# Patient Record
Sex: Male | Born: 1955 | Race: White | Hispanic: No | Marital: Single | Smoking: Current every day smoker
Health system: Southern US, Community
[De-identification: ages and names within clinical notes are randomized; demographics above are authoritative.]

## PROBLEM LIST (undated history)

## (undated) ENCOUNTER — Emergency Department (HOSPITAL_COMMUNITY): Discharge status: Against Medical Advice | Payer: BC Managed Care – PPO

## (undated) DIAGNOSIS — Z7189 Other specified counseling: Secondary | ICD-10-CM

## (undated) DIAGNOSIS — K909 Intestinal malabsorption, unspecified: Secondary | ICD-10-CM

## (undated) DIAGNOSIS — T1490XA Injury, unspecified, initial encounter: Secondary | ICD-10-CM

## (undated) DIAGNOSIS — D500 Iron deficiency anemia secondary to blood loss (chronic): Secondary | ICD-10-CM

## (undated) DIAGNOSIS — Z789 Other specified health status: Secondary | ICD-10-CM

## (undated) DIAGNOSIS — C73 Malignant neoplasm of thyroid gland: Secondary | ICD-10-CM

## (undated) HISTORY — DX: Intestinal malabsorption, unspecified: K90.9

## (undated) HISTORY — DX: Iron deficiency anemia secondary to blood loss (chronic): D50.0

## (undated) HISTORY — DX: Malignant neoplasm of thyroid gland: C73

## (undated) HISTORY — DX: Other specified counseling: Z71.89

---

## 2010-09-07 ENCOUNTER — Other Ambulatory Visit (HOSPITAL_COMMUNITY): Payer: Worker's Compensation

## 2010-09-13 ENCOUNTER — Ambulatory Visit (HOSPITAL_COMMUNITY)
Admission: RE | Admit: 2010-09-13 | Discharge: 2010-09-13 | Disposition: A | Discharge status: Home / Self Care | Payer: Worker's Compensation | Source: Ambulatory Visit | Attending: Neurological Surgery | Admitting: Neurological Surgery

## 2010-09-13 ENCOUNTER — Encounter (HOSPITAL_COMMUNITY)
Admission: RE | Admit: 2010-09-13 | Discharge: 2010-09-13 | Disposition: A | Discharge status: Home / Self Care | Payer: Worker's Compensation | Source: Ambulatory Visit | Attending: Neurological Surgery | Admitting: Neurological Surgery

## 2010-09-13 ENCOUNTER — Other Ambulatory Visit (HOSPITAL_COMMUNITY): Payer: Self-pay | Admitting: Neurological Surgery

## 2010-09-13 DIAGNOSIS — M4800 Spinal stenosis, site unspecified: Secondary | ICD-10-CM

## 2010-09-13 DIAGNOSIS — Z01818 Encounter for other preprocedural examination: Secondary | ICD-10-CM | POA: Insufficient documentation

## 2010-09-13 DIAGNOSIS — Z01812 Encounter for preprocedural laboratory examination: Secondary | ICD-10-CM | POA: Insufficient documentation

## 2010-09-13 DIAGNOSIS — Z01810 Encounter for preprocedural cardiovascular examination: Secondary | ICD-10-CM | POA: Insufficient documentation

## 2010-09-13 LAB — CBC
HCT: 44.7 % (ref 39.0–52.0)
MCH: 32.9 pg (ref 26.0–34.0)
MCHC: 36 g/dL (ref 30.0–36.0)
MCV: 91.2 fL (ref 78.0–100.0)
Platelets: 194 10*3/uL (ref 150–400)
RDW: 12.1 % (ref 11.5–15.5)
WBC: 6.6 10*3/uL (ref 4.0–10.5)

## 2010-09-13 LAB — TYPE AND SCREEN: Antibody Screen: NEGATIVE

## 2010-09-13 LAB — BASIC METABOLIC PANEL
Calcium: 9.5 mg/dL (ref 8.4–10.5)
GFR calc Af Amer: >60 mL/min (ref >60)
GFR calc non Af Amer: >60 mL/min (ref >60)
Glucose, Bld: 63 mg/dL — ABNORMAL LOW (ref 70–99)
Potassium: 3.8 mEq/L (ref 3.5–5.1)
Sodium: 139 mEq/L (ref 135–145)

## 2010-09-13 LAB — DIFFERENTIAL
Eosinophils Absolute: 0.1 10*3/uL (ref 0.0–0.7)
Eosinophils Relative: 1 % (ref 0–5)
Lymphocytes Relative: 30 % (ref 12–46)
Lymphs Abs: 2 10*3/uL (ref 0.7–4.0)
Monocytes Absolute: 0.7 10*3/uL (ref 0.1–1.0)
Monocytes Relative: 11 % (ref 3–12)

## 2010-09-13 LAB — SURGICAL PCR SCREEN: Staphylococcus aureus: NEGATIVE

## 2010-09-13 LAB — PROTIME-INR: INR: 0.91 (ref 0.00–1.49)

## 2010-09-15 ENCOUNTER — Inpatient Hospital Stay (HOSPITAL_COMMUNITY)
Admission: RE | Admit: 2010-09-15 | Discharge: 2010-09-18 | DRG: 460 | Disposition: A | Discharge status: Home / Self Care | Payer: Worker's Compensation | Source: Ambulatory Visit | Attending: Neurological Surgery | Admitting: Neurological Surgery

## 2010-09-15 ENCOUNTER — Inpatient Hospital Stay (HOSPITAL_COMMUNITY): Payer: Worker's Compensation

## 2010-09-15 DIAGNOSIS — F17200 Nicotine dependence, unspecified, uncomplicated: Secondary | ICD-10-CM | POA: Diagnosis present

## 2010-09-15 DIAGNOSIS — M5126 Other intervertebral disc displacement, lumbar region: Principal | ICD-10-CM | POA: Diagnosis present

## 2010-09-15 DIAGNOSIS — IMO0002 Reserved for concepts with insufficient information to code with codable children: Secondary | ICD-10-CM | POA: Diagnosis not present

## 2010-09-15 DIAGNOSIS — M5137 Other intervertebral disc degeneration, lumbosacral region: Secondary | ICD-10-CM | POA: Diagnosis present

## 2010-09-15 DIAGNOSIS — Z01818 Encounter for other preprocedural examination: Secondary | ICD-10-CM

## 2010-09-15 DIAGNOSIS — Z01810 Encounter for preprocedural cardiovascular examination: Secondary | ICD-10-CM

## 2010-09-15 DIAGNOSIS — G9741 Accidental puncture or laceration of dura during a procedure: Secondary | ICD-10-CM | POA: Diagnosis not present

## 2010-09-15 DIAGNOSIS — Y9210 Unspecified residential institution as the place of occurrence of the external cause: Secondary | ICD-10-CM | POA: Diagnosis not present

## 2010-09-15 DIAGNOSIS — M51379 Other intervertebral disc degeneration, lumbosacral region without mention of lumbar back pain or lower extremity pain: Secondary | ICD-10-CM | POA: Diagnosis present

## 2010-09-15 DIAGNOSIS — Z01812 Encounter for preprocedural laboratory examination: Secondary | ICD-10-CM

## 2010-09-27 ENCOUNTER — Ambulatory Visit
Admission: RE | Admit: 2010-09-27 | Discharge: 2010-09-27 | Disposition: A | Discharge status: Home / Self Care | Payer: Worker's Compensation | Source: Ambulatory Visit | Attending: Neurological Surgery | Admitting: Neurological Surgery

## 2010-09-27 ENCOUNTER — Other Ambulatory Visit: Payer: Self-pay | Admitting: Neurological Surgery

## 2010-09-27 DIAGNOSIS — M5126 Other intervertebral disc displacement, lumbar region: Secondary | ICD-10-CM

## 2010-09-27 DIAGNOSIS — M79609 Pain in unspecified limb: Secondary | ICD-10-CM

## 2010-09-27 DIAGNOSIS — M5137 Other intervertebral disc degeneration, lumbosacral region: Secondary | ICD-10-CM

## 2010-09-28 NOTE — Op Note (Signed)
NAME:  Bruce Olson, Bruce Olson NO.:  [id number]  MEDICAL RECORD NO.:  [id number]  LOCATION:  3035                         FACILITY:  [facility]  PHYSICIAN:  Tia Alert, MD     DATE OF BIRTH:  11/06/1955  DATE OF PROCEDURE:  09/15/2010 DATE OF DISCHARGE:                              OPERATIVE REPORT   PREOPERATIVE DIAGNOSES: 1. Degenerative disk disease L4-5, L5-S1. 2. 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 3. 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 4. Back pain. 5. Right leg pain.  POSTOPERATIVE DIAGNOSES: 1. Degenerative disk disease L4-5, L5-S1. 2. 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 3. 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 4. Back pain. 5. Right leg pain.  PROCEDURE PERFORMED: 1. Bilateral lumbar decompressive laminectomies and hemi-facetectomies     for decompression of the L4-L5 and S1 nerve roots requiring more     work than is required for a simple PLIF exposure in order to     adequately decompress the neural elements. 2. Posterior lumbar interbody fusion L4-5, L5-S1 utilizing 10-mm PEEK     interbody cages packed with local autograft and morselized     allograft, and 10-mm Tangent interbody bone wedges. 3. Intertransverse arthrodesis L4-S1 utilizing local autograft and     morselized allograft. 4. Segmental fixation L4-S1 utilizing the globus pedicle screw system.  SURGEON:  Tia Alert, MD.  ASSISTANT:  Donalee Citrin, MD.  ANESTHESIA:  General tracheal.  COMPLICATIONS:  Small unintended durotomy repaired primarily in the midline and the L4-5 level.  INDICATIONS FOR PROCEDURE:  Bruce Olson is a 55 year old gentleman who hurt his back about a year and half ago.  He had MRI, which showed severe disk degeneration and desiccation with Modic endplate changes and severe foraminal stenosis L5-S1 with broad-based disk herniation at each level.  He had back and right leg pain.  He had tried medical management for some time without significant relief.  I recommended  two-level PLIF at L4-5 and L5-S1.  He understood the risks, benefits, and suspected outcome and wished to proceed.  DESCRIPTION OF PROCEDURE:  The patient was taken to the operating room. After induction of adequate generalized endotracheal anesthesia, he was rolled into prone position on chest rolls and all pressure points were padded.  His lumbar region was prepped with DuraPrep and then draped in usual sterile fashion, 5 mL of local anesthesia injected, and a small dorsal midline incision made and carried down to the sacral fascia.  The fascia was opened and the paraspinous musculature was taken down in subperiosteal fashion to expose L4-5 and L5-S1.  Intraoperative fluoroscopy confirmed that level and then I removed the spinous processes of L4 and L5.  I used the Leksell rongeur and the Kerrison punch to perform complete laminectomies, hemi-facetectomy, and foraminotomies at L4-5 and L5-S1. At L4-5, the dura was quite adherent to the superior extent of the yellow ligament, and lamina complex right in the midline, and a small unintended durotomy was created.  Once I recognized this, I was able to dissect the dura away from the undersurface of the bone, widen my  laminectomy here to expose the unintended durotomy.  I then was able to use a 5-0 Prolene and close this primarily with a  running suture. I then had the patient Valsalva and saw no evidence of CSF leakage.  I then completed my decompression by finishing my hemi-facetectomies at L4-5 and L5-S1.  I continued to remove the yellow ligament from the lateral recesses and the foramina, identified the L4, L5, and S1 nerve roots, and decompressed them distally and their respective foramina marching along the pedicle, skeletonizing the pedicle.  I did this especially on the patient's right side because of right-sided leg pain.  I completely skeletonized the L4 and L5 nerve roots distally.  I then turned my attention to the posterior  lumbar interbody fusion IV.  We incised the disk space at L4-5 and L5-S1 bilaterally.  We distracted the space up to 10 mm and then used a 10-mm rotating cutter and cutting chisel to prepare the endplates.  We did this bilaterally.  The midline was prepared with an Epstein curette, a near complete diskectomy was completed on the right- hand side, which was fixed from working out underneath the nerve roots and the extraforaminal space making sure the nerve roots were well decompressed.  We then used 10-mm PEEK interbody cages packed with a local autograft and morselized allograft and tapped these into position at L4-5, L5-S1, and also used 10-mm Tangent interbody bone wedges on the opposite side.  The midline was packed with local autograft and morselized allograft.  Once our PLIF was completed, we turned our attention to the pedicle screws.  We localized pedicle screw entry zones bilaterally utilizing surface landmarks and lateral fluoroscopy.  We probed each pedicle with pedicle probe, tapped each pedicle and placed 65 x 45 mm pedicle screws in the L4-L5 pedicles and 65 x 35 mm pedicle screws into the sacrum bilaterally.  The transverse processes were decorticated and a mixture of local autograft and morselized allograft were placed out over these to perform intertransverse arthrodesis at each level.  We then placed lordotic rods into the multiaxial screw heads of the pedicle screws, and locked these in position with locking caps and rectal devices while achieving compression of our grafts.  The cross-link was then placed. The wound was copiously irrigated with bacitracin cleansing solution.  The surgical bed was dried with bipolar cautery, followed by a Surgifoam.  I palpated along the nerve roots once again with coronary dilator and tried decompression of the nerve roots. I then inspected the primary closure of the unintended durotomy once again, it looked quite good.  I lined this with  Tisseel fibrin glue and then lined the dura with Gelfoam.  I placed a medium Hemovac drain through separate stab incision and placed it in the lateral gutter, away from the unintended durotomy.  I then closed the muscle and fascia with 0 Vicryl and closed subcutaneous tissue with 2-0 and 3-0 Vicryl, and closed skin with Benzoin and Steri-Strips.  The drapes were removed. Sterile dressing was applied.  The patient awakened from general anesthesia and transferred to recovery room in stable condition. At the end of procedure, all sponge, needles counts were correct.     Tia Alert, MD     DSJ/MEDQ  D:  09/15/2010  T:  09/16/2010  Job:  [id number]  Electronically Signed by Marikay Alar MD on 09/28/2010 02:25:07 PM

## 2010-11-19 NOTE — Discharge Summary (Signed)
  NAME:  Bruce Olson, CARLILE NO.:  [id number]  MEDICAL RECORD NO.:  [id number]  LOCATION:  3035                         FACILITY:  [facility]  PHYSICIAN:  Tia Alert, MD     DATE OF BIRTH:  [birth year]  DATE OF ADMISSION:  09/15/2010 DATE OF DISCHARGE:  09/18/2010                              DISCHARGE SUMMARY   ADMITTING DIAGNOSIS:  Lumbar spondylosis with degenerative disk disease.  PROCEDURE:  Posterior lumbar interbody fusion at L4-L5 and L5-S1.  BRIEF HISTORY OF PRESENT ILLNESS:  Bruce Olson is a very pleasant 55- year-old gentleman who hurt his back about a year-and-half.  Prior to the surgery, he had an MRI which showed severe degenerative disk disease and desiccation with Modic endplate changes with severe foraminal stenosis at both levels.  He had back and right leg pain.  I recommended two-level posterior lumbar interbody fusion at L4-L5 and L5-S1.  He understood the risks, benefits, and expected outcome and wished to proceed.  HOSPITAL COURSE:  The patient was admitted on September 15, 2010 where he underwent a PLIF at L4-L5 and L5-S1.  The patient tolerated the procedure well and was taken to the recovery room and then to the floor in stable condition.  For details of the operative procedure, please see the dictated operative note.  The patient's hospital course was fairly routine.  He did have a unintended durotomy at the time of the surgery and therefore he remained at flat bed rest for 2 days.  He has been mobilized and did fairly well.  He had significant back soreness which was expected and had resolution of his leg pain.  His incision was clean, dry, and intact.  He had no postoperative headache.  He was discharged home in stable condition on September 18, 2010 with plans to follow up in 2 weeks.  He was to call for any unusual redness, tenderness, or drainage from his wound or any temperature of 101.5 or any significant headaches.  Plan is to follow  up in 2 weeks.  FINAL DIAGNOSIS:  Posterior lumbar interbody fusion, L4-L5 and L5-S1.     Tia Alert, MD     DSJ/MEDQ  D:  11/09/2010  T:  11/09/2010  Job:  [id number]  Electronically Signed by Marikay Alar MD on 11/19/2010 10:49:26 AM

## 2010-11-22 ENCOUNTER — Other Ambulatory Visit: Payer: Self-pay | Admitting: Neurological Surgery

## 2010-11-22 ENCOUNTER — Ambulatory Visit
Admission: RE | Admit: 2010-11-22 | Discharge: 2010-11-22 | Disposition: A | Discharge status: Home / Self Care | Payer: Self-pay | Source: Ambulatory Visit | Attending: Neurological Surgery | Admitting: Neurological Surgery

## 2010-11-22 DIAGNOSIS — M5126 Other intervertebral disc displacement, lumbar region: Secondary | ICD-10-CM

## 2010-11-22 DIAGNOSIS — M48061 Spinal stenosis, lumbar region without neurogenic claudication: Secondary | ICD-10-CM

## 2011-02-01 DIAGNOSIS — T1490XA Injury, unspecified, initial encounter: Secondary | ICD-10-CM

## 2011-02-01 HISTORY — DX: Injury, unspecified, initial encounter: T14.90XA

## 2011-02-01 HISTORY — PX: BACK SURGERY: SHX140

## 2011-02-22 ENCOUNTER — Other Ambulatory Visit: Payer: Self-pay | Admitting: Neurological Surgery

## 2011-02-22 ENCOUNTER — Ambulatory Visit
Admission: RE | Admit: 2011-02-22 | Discharge: 2011-02-22 | Disposition: A | Discharge status: Home / Self Care | Payer: Worker's Compensation | Source: Ambulatory Visit | Attending: Neurological Surgery | Admitting: Neurological Surgery

## 2011-02-22 DIAGNOSIS — M5126 Other intervertebral disc displacement, lumbar region: Secondary | ICD-10-CM

## 2011-02-22 DIAGNOSIS — M5137 Other intervertebral disc degeneration, lumbosacral region: Secondary | ICD-10-CM

## 2011-03-14 ENCOUNTER — Other Ambulatory Visit: Payer: Self-pay | Admitting: Neurological Surgery

## 2011-03-14 DIAGNOSIS — M545 Low back pain: Secondary | ICD-10-CM

## 2011-03-28 ENCOUNTER — Ambulatory Visit
Admission: RE | Admit: 2011-03-28 | Discharge: 2011-03-28 | Disposition: A | Discharge status: Home / Self Care | Payer: Worker's Compensation | Source: Ambulatory Visit | Attending: Neurological Surgery | Admitting: Neurological Surgery

## 2011-03-28 DIAGNOSIS — M545 Low back pain: Secondary | ICD-10-CM

## 2011-04-15 ENCOUNTER — Encounter (HOSPITAL_COMMUNITY): Payer: Self-pay | Admitting: Pharmacy Technician

## 2011-04-20 NOTE — Pre-Procedure Instructions (Signed)
20 Bruce Olson  04/20/2011   Your procedure is scheduled on:  Tuesday April 26, 2011  Report to [facility] at  0745 AM.  Call this number if you have problems the morning of surgery: [phone]   Remember:   Do not eat food:After Midnight.  May have clear liquids: up to 4 Hours before arrival until 0345 am.  Clear liquids include soda, tea, black coffee, apple or grape juice, broth.  Take these medicines the morning of surgery with A SIP OF WATER: Oxycodone if needed for pain, and Pregabalin.   Do not wear jewelry, make-up or nail polish.  Do not wear lotions, powders, or perfumes. You may wear deodorant.  Do not shave 48 hours prior to surgery.  Do not bring valuables to the hospital.  Contacts, dentures or bridgework may not be worn into surgery.  Leave suitcase in the car. After surgery it may be brought to your room.  For patients admitted to the hospital, checkout time is 11:00 AM the day of discharge.   Patients discharged the day of surgery will not be allowed to drive home.  Name and phone number of your driver:   Special Instructions: CHG Shower Use Special Wash: 1/2 bottle night before surgery and 1/2 bottle morning of surgery.   Please read over the following fact sheets that you were given: Pain Booklet, Coughing and Deep Breathing and Surgical Site Infection Prevention

## 2011-04-21 ENCOUNTER — Encounter (HOSPITAL_COMMUNITY)
Admission: RE | Admit: 2011-04-21 | Discharge: 2011-04-21 | Disposition: A | Discharge status: Home / Self Care | Payer: Worker's Compensation | Source: Ambulatory Visit | Attending: Neurological Surgery | Admitting: Neurological Surgery

## 2011-04-21 ENCOUNTER — Encounter (HOSPITAL_COMMUNITY): Payer: Self-pay

## 2011-04-21 HISTORY — DX: Other specified health status: Z78.9

## 2011-04-21 LAB — APTT: aPTT: 29 seconds (ref 24–37)

## 2011-04-21 LAB — BASIC METABOLIC PANEL
BUN: 6 mg/dL (ref 6–23)
CO2: 25 mEq/L (ref 19–32)
Chloride: 102 mEq/L (ref 96–112)
GFR calc Af Amer: >90 mL/min (ref >90)
Potassium: 3.9 mEq/L (ref 3.5–5.1)

## 2011-04-21 LAB — PROTIME-INR: INR: 1.03 (ref 0.00–1.49)

## 2011-04-21 LAB — CBC
HCT: 41.4 % (ref 39.0–52.0)
Hemoglobin: 14.8 g/dL (ref 13.0–17.0)
MCH: 31 pg (ref 26.0–34.0)
RBC: 4.77 MIL/uL (ref 4.22–5.81)

## 2011-04-21 LAB — SURGICAL PCR SCREEN: MRSA, PCR: NEGATIVE

## 2011-04-21 LAB — TYPE AND SCREEN
ABO/RH(D): A POS
Antibody Screen: NEGATIVE

## 2011-04-21 LAB — DIFFERENTIAL
Eosinophils Absolute: 0 10*3/uL (ref 0.0–0.7)
Lymphs Abs: 2 10*3/uL (ref 0.7–4.0)
Monocytes Absolute: 0.6 10*3/uL (ref 0.1–1.0)
Monocytes Relative: 11 % (ref 3–12)
Neutrophils Relative %: 52 % (ref 43–77)

## 2011-04-25 MED ORDER — CEFAZOLIN SODIUM 1-5 GM-% IV SOLN
1.0000 g | INTRAVENOUS | Status: DC
  Filled 2011-04-25: qty 50

## 2011-04-26 ENCOUNTER — Ambulatory Visit (HOSPITAL_COMMUNITY)
Admission: RE | Admit: 2011-04-26 | Discharge: 2011-04-26 | Disposition: A | Discharge status: Home / Self Care | Payer: Worker's Compensation | Source: Ambulatory Visit | Attending: Neurological Surgery | Admitting: Neurological Surgery

## 2011-04-26 ENCOUNTER — Encounter (HOSPITAL_COMMUNITY): Payer: Self-pay | Admitting: *Deleted

## 2011-04-26 MED ORDER — DEXAMETHASONE SODIUM PHOSPHATE 10 MG/ML IJ SOLN: 10.0000 mg | Freq: Once | INTRAMUSCULAR | Status: DC

## 2011-04-26 MED ORDER — DEXTROSE 5 % IV SOLN: 1.0000 g | INTRAVENOUS | Status: DC

## 2011-04-26 MED ORDER — CEFAZOLIN SODIUM 1-5 GM-% IV SOLN
1.0000 g | INTRAVENOUS | Status: AC
  Administered 2011-04-27: 1 g via INTRAVENOUS
  Filled 2011-04-26 (×2): qty 50

## 2011-04-26 MED ORDER — DEXAMETHASONE SODIUM PHOSPHATE 10 MG/ML IJ SOLN
INTRAMUSCULAR | Status: AC
  Filled 2011-04-26: qty 1

## 2011-04-26 NOTE — Progress Notes (Signed)
Notified both HEATHER AND DR HODIERN IN NEURO OR OF PATIENT STATING HE DRANK 2-3 CUPS BLACK COFFEE 730 AM TODAY.

## 2011-04-27 ENCOUNTER — Encounter (HOSPITAL_COMMUNITY): Payer: Self-pay | Admitting: Anesthesiology

## 2011-04-27 ENCOUNTER — Inpatient Hospital Stay (HOSPITAL_COMMUNITY)
Admission: RE | Admit: 2011-04-27 | Discharge: 2011-05-02 | DRG: 460 | Disposition: A | Discharge status: Home / Self Care | Payer: Worker's Compensation | Source: Ambulatory Visit | Attending: Neurological Surgery | Admitting: Neurological Surgery

## 2011-04-27 ENCOUNTER — Ambulatory Visit (HOSPITAL_COMMUNITY): Payer: Worker's Compensation

## 2011-04-27 ENCOUNTER — Encounter (HOSPITAL_COMMUNITY): Payer: Self-pay | Admitting: *Deleted

## 2011-04-27 ENCOUNTER — Ambulatory Visit (HOSPITAL_COMMUNITY)
Admission: RE | Admit: 2011-04-27 | Payer: Worker's Compensation | Source: Ambulatory Visit | Admitting: Neurological Surgery

## 2011-04-27 ENCOUNTER — Encounter (HOSPITAL_COMMUNITY)
Admission: RE | Disposition: A | Discharge status: Home / Self Care | Payer: Self-pay | Source: Ambulatory Visit | Attending: Neurological Surgery

## 2011-04-27 ENCOUNTER — Ambulatory Visit (HOSPITAL_COMMUNITY): Payer: Worker's Compensation | Admitting: Anesthesiology

## 2011-04-27 ENCOUNTER — Encounter (HOSPITAL_COMMUNITY): Admission: RE | Payer: Self-pay | Source: Ambulatory Visit

## 2011-04-27 DIAGNOSIS — M48061 Spinal stenosis, lumbar region without neurogenic claudication: Secondary | ICD-10-CM | POA: Diagnosis present

## 2011-04-27 DIAGNOSIS — F17200 Nicotine dependence, unspecified, uncomplicated: Secondary | ICD-10-CM | POA: Diagnosis present

## 2011-04-27 DIAGNOSIS — S32009A Unspecified fracture of unspecified lumbar vertebra, initial encounter for closed fracture: Principal | ICD-10-CM | POA: Diagnosis present

## 2011-04-27 DIAGNOSIS — X58XXXA Exposure to other specified factors, initial encounter: Secondary | ICD-10-CM | POA: Diagnosis present

## 2011-04-27 SURGERY — POSTERIOR LUMBAR FUSION 1 LEVEL
Anesthesia: General | Site: Back | Wound class: Clean

## 2011-04-27 SURGERY — POSTERIOR LUMBAR FUSION 1 LEVEL
Anesthesia: General | Site: Back

## 2011-04-27 MED ORDER — HYDROMORPHONE HCL PF 1 MG/ML IJ SOLN
INTRAMUSCULAR | Status: AC
  Filled 2011-04-27: qty 1

## 2011-04-27 MED ORDER — GLYCOPYRROLATE 0.2 MG/ML IJ SOLN
INTRAMUSCULAR | Status: DC | PRN
  Administered 2011-04-27: .6 mg via INTRAVENOUS

## 2011-04-27 MED ORDER — PHENOL 1.4 % MT LIQD: 1.0000 | OROMUCOSAL | Status: DC | PRN

## 2011-04-27 MED ORDER — VECURONIUM BROMIDE 10 MG IV SOLR
INTRAVENOUS | Status: DC | PRN
  Administered 2011-04-27: 2 mg via INTRAVENOUS
  Administered 2011-04-27: 1 mg via INTRAVENOUS
  Administered 2011-04-27 (×3): 2 mg via INTRAVENOUS
  Administered 2011-04-27: 1 mg via INTRAVENOUS
  Administered 2011-04-27: 3 mg via INTRAVENOUS

## 2011-04-27 MED ORDER — HYDROMORPHONE 0.3 MG/ML IV SOLN
INTRAVENOUS | Status: AC
  Administered 2011-04-27: 18:00:00
  Filled 2011-04-27: qty 25

## 2011-04-27 MED ORDER — LIDOCAINE HCL (CARDIAC) 20 MG/ML IV SOLN
INTRAVENOUS | Status: DC | PRN
  Administered 2011-04-27: 50 mg via INTRAVENOUS

## 2011-04-27 MED ORDER — PREGABALIN 75 MG PO CAPS
150.0000 mg | ORAL_CAPSULE | Freq: Two times a day (BID) | ORAL | Status: DC
  Administered 2011-04-27 – 2011-05-02 (×10): 150 mg via ORAL
  Filled 2011-04-27 (×3): qty 2
  Filled 2011-04-27: qty 1
  Filled 2011-04-27: qty 2
  Filled 2011-04-27: qty 1
  Filled 2011-04-27 (×5): qty 2

## 2011-04-27 MED ORDER — SODIUM CHLORIDE 0.9 % IV SOLN
INTRAVENOUS | Status: AC
  Administered 2011-04-27: 16:00:00 via INTRAVENOUS
  Filled 2011-04-27: qty 500

## 2011-04-27 MED ORDER — ZOLPIDEM TARTRATE 10 MG PO TABS: 10.0000 mg | ORAL_TABLET | Freq: Every evening | ORAL | Status: DC | PRN

## 2011-04-27 MED ORDER — LACTATED RINGERS IV SOLN: INTRAVENOUS | Status: DC

## 2011-04-27 MED ORDER — DEXAMETHASONE 4 MG PO TABS
4.0000 mg | ORAL_TABLET | Freq: Four times a day (QID) | ORAL | Status: DC
  Administered 2011-04-27 – 2011-05-01 (×13): 4 mg via ORAL
  Filled 2011-04-27 (×18): qty 1

## 2011-04-27 MED ORDER — SODIUM CHLORIDE 0.9 % IJ SOLN: 3.0000 mL | INTRAMUSCULAR | Status: DC | PRN

## 2011-04-27 MED ORDER — SODIUM CHLORIDE 0.9 % IR SOLN
Status: DC | PRN
  Administered 2011-04-27: 14:00:00

## 2011-04-27 MED ORDER — PROPOFOL 10 MG/ML IV EMUL
INTRAVENOUS | Status: DC | PRN
  Administered 2011-04-27: 200 mg via INTRAVENOUS

## 2011-04-27 MED ORDER — POTASSIUM CHLORIDE IN NACL 20-0.9 MEQ/L-% IV SOLN
INTRAVENOUS | Status: DC
  Administered 2011-04-27 – 2011-04-30 (×4): via INTRAVENOUS
  Filled 2011-04-27 (×11): qty 1000

## 2011-04-27 MED ORDER — KETOROLAC TROMETHAMINE 30 MG/ML IJ SOLN
INTRAMUSCULAR | Status: AC
  Administered 2011-04-27: 30 mg via INTRAVENOUS
  Filled 2011-04-27: qty 1

## 2011-04-27 MED ORDER — ROCURONIUM BROMIDE 100 MG/10ML IV SOLN
INTRAVENOUS | Status: DC | PRN
  Administered 2011-04-27: 50 mg via INTRAVENOUS

## 2011-04-27 MED ORDER — SODIUM CHLORIDE 0.9 % IJ SOLN: 3.0000 mL | Freq: Two times a day (BID) | INTRAMUSCULAR | Status: DC

## 2011-04-27 MED ORDER — 0.9 % SODIUM CHLORIDE (POUR BTL) OPTIME
TOPICAL | Status: DC | PRN
  Administered 2011-04-27: 1000 mL

## 2011-04-27 MED ORDER — KETOROLAC TROMETHAMINE 30 MG/ML IJ SOLN
30.0000 mg | Freq: Once | INTRAMUSCULAR | Status: AC
  Administered 2011-04-27: 30 mg via INTRAVENOUS

## 2011-04-27 MED ORDER — DIPHENHYDRAMINE HCL 12.5 MG/5ML PO ELIX: 12.5000 mg | ORAL_SOLUTION | Freq: Four times a day (QID) | ORAL | Status: DC | PRN

## 2011-04-27 MED ORDER — NEOSTIGMINE METHYLSULFATE 1 MG/ML IJ SOLN
INTRAMUSCULAR | Status: DC | PRN
  Administered 2011-04-27: 4 mg via INTRAVENOUS

## 2011-04-27 MED ORDER — THROMBIN 20000 UNITS EX KIT
PACK | CUTANEOUS | Status: DC | PRN
  Administered 2011-04-27: 14:00:00 via TOPICAL

## 2011-04-27 MED ORDER — ALBUMIN HUMAN 5 % IV SOLN
INTRAVENOUS | Status: DC | PRN
  Administered 2011-04-27: 14:00:00 via INTRAVENOUS

## 2011-04-27 MED ORDER — DEXAMETHASONE SODIUM PHOSPHATE 10 MG/ML IJ SOLN
INTRAMUSCULAR | Status: AC
  Administered 2011-04-27: 10 mg via INTRAVENOUS
  Filled 2011-04-27: qty 1

## 2011-04-27 MED ORDER — DEXAMETHASONE SODIUM PHOSPHATE 4 MG/ML IJ SOLN
4.0000 mg | Freq: Four times a day (QID) | INTRAMUSCULAR | Status: DC
  Filled 2011-04-27 (×16): qty 1

## 2011-04-27 MED ORDER — NICOTINE 21 MG/24HR TD PT24
21.0000 mg | MEDICATED_PATCH | Freq: Every day | TRANSDERMAL | Status: DC
  Administered 2011-04-27 – 2011-05-01 (×5): 21 mg via TRANSDERMAL
  Filled 2011-04-27 (×6): qty 1

## 2011-04-27 MED ORDER — CEFAZOLIN SODIUM 1-5 GM-% IV SOLN
1.0000 g | Freq: Three times a day (TID) | INTRAVENOUS | Status: AC
  Administered 2011-04-27 – 2011-04-28 (×2): 1 g via INTRAVENOUS
  Filled 2011-04-27 (×2): qty 50

## 2011-04-27 MED ORDER — MORPHINE SULFATE 10 MG/ML IJ SOLN
INTRAMUSCULAR | Status: DC | PRN
  Administered 2011-04-27 (×3): 2 mg via INTRAVENOUS
  Administered 2011-04-27: 4 mg via INTRAVENOUS

## 2011-04-27 MED ORDER — MIDAZOLAM HCL 5 MG/5ML IJ SOLN
INTRAMUSCULAR | Status: DC | PRN
  Administered 2011-04-27: 2 mg via INTRAVENOUS

## 2011-04-27 MED ORDER — NALOXONE HCL 0.4 MG/ML IJ SOLN: 0.4000 mg | INTRAMUSCULAR | Status: DC | PRN

## 2011-04-27 MED ORDER — ACETAMINOPHEN 325 MG PO TABS
650.0000 mg | ORAL_TABLET | ORAL | Status: DC | PRN
  Filled 2011-04-27: qty 2

## 2011-04-27 MED ORDER — EPHEDRINE SULFATE 50 MG/ML IJ SOLN
INTRAMUSCULAR | Status: DC | PRN
  Administered 2011-04-27: 5 mg via INTRAVENOUS

## 2011-04-27 MED ORDER — HYDROMORPHONE HCL PF 1 MG/ML IJ SOLN
0.2500 mg | INTRAMUSCULAR | Status: DC | PRN
  Administered 2011-04-27 (×4): 0.5 mg via INTRAVENOUS

## 2011-04-27 MED ORDER — MORPHINE SULFATE 2 MG/ML IJ SOLN: 0.0500 mg/kg | INTRAMUSCULAR | Status: DC | PRN

## 2011-04-27 MED ORDER — HYDROMORPHONE 0.3 MG/ML IV SOLN
INTRAVENOUS | Status: DC
Start: 2011-04-27 — End: 2011-05-01
  Administered 2011-04-27: 1.5 mg via INTRAVENOUS
  Administered 2011-04-28: 2.08 mg via INTRAVENOUS
  Administered 2011-04-28: 3.69 mg via INTRAVENOUS
  Administered 2011-04-28: 4.49 mg via INTRAVENOUS
  Administered 2011-04-28: 1.73 mg via INTRAVENOUS
  Administered 2011-04-29 (×2): 0.6 mg via INTRAVENOUS
  Administered 2011-04-29: 0.9 mg via INTRAVENOUS
  Administered 2011-04-29: 1.8 mg via INTRAVENOUS
  Administered 2011-04-29: 0.9 mg via INTRAVENOUS
  Administered 2011-04-29 – 2011-04-30 (×2): 2.1 mg via INTRAVENOUS
  Administered 2011-04-30: 1.8 mg via INTRAVENOUS
  Administered 2011-04-30: 1.5 mg via INTRAVENOUS
  Administered 2011-04-30: 10:00:00 via INTRAVENOUS
  Administered 2011-05-01 (×2): 0.9 mg via INTRAVENOUS
  Administered 2011-05-01: 2.06 mg via INTRAVENOUS

## 2011-04-27 MED ORDER — ONDANSETRON HCL 4 MG/2ML IJ SOLN: 4.0000 mg | INTRAMUSCULAR | Status: DC | PRN

## 2011-04-27 MED ORDER — BUPIVACAINE HCL (PF) 0.25 % IJ SOLN
INTRAMUSCULAR | Status: DC | PRN
  Administered 2011-04-27: 8 mL

## 2011-04-27 MED ORDER — HYDROMORPHONE HCL PF 1 MG/ML IJ SOLN
0.5000 mg | INTRAMUSCULAR | Status: AC | PRN
  Administered 2011-04-27 (×4): 0.5 mg via INTRAVENOUS

## 2011-04-27 MED ORDER — MENTHOL 3 MG MT LOZG
1.0000 | LOZENGE | OROMUCOSAL | Status: DC | PRN
  Administered 2011-04-28: 3 mg via ORAL
  Filled 2011-04-27 (×2): qty 9

## 2011-04-27 MED ORDER — DOCUSATE SODIUM 100 MG PO CAPS
100.0000 mg | ORAL_CAPSULE | Freq: Every day | ORAL | Status: DC
  Administered 2011-04-27 – 2011-05-02 (×6): 100 mg via ORAL
  Filled 2011-04-27 (×5): qty 1

## 2011-04-27 MED ORDER — LABETALOL HCL 5 MG/ML IV SOLN
INTRAVENOUS | Status: DC | PRN
  Administered 2011-04-27: 5 mg via INTRAVENOUS

## 2011-04-27 MED ORDER — SODIUM CHLORIDE 0.9 % IJ SOLN: 9.0000 mL | INTRAMUSCULAR | Status: DC | PRN

## 2011-04-27 MED ORDER — ACETAMINOPHEN 650 MG RE SUPP: 650.0000 mg | RECTAL | Status: DC | PRN

## 2011-04-27 MED ORDER — METHOCARBAMOL 100 MG/ML IJ SOLN
500.0000 mg | Freq: Four times a day (QID) | INTRAVENOUS | Status: DC | PRN
  Filled 2011-04-27: qty 5

## 2011-04-27 MED ORDER — FENTANYL CITRATE 0.05 MG/ML IJ SOLN
INTRAMUSCULAR | Status: DC | PRN
  Administered 2011-04-27 (×2): 50 ug via INTRAVENOUS
  Administered 2011-04-27: 100 ug via INTRAVENOUS
  Administered 2011-04-27 (×6): 50 ug via INTRAVENOUS

## 2011-04-27 MED ORDER — METHOCARBAMOL 500 MG PO TABS
500.0000 mg | ORAL_TABLET | Freq: Four times a day (QID) | ORAL | Status: DC | PRN
  Administered 2011-04-28 – 2011-05-02 (×13): 500 mg via ORAL
  Filled 2011-04-27 (×14): qty 1

## 2011-04-27 MED ORDER — OXYCODONE-ACETAMINOPHEN 5-325 MG PO TABS
1.0000 | ORAL_TABLET | ORAL | Status: DC | PRN
Start: 2011-04-27 — End: 2011-05-02
  Administered 2011-04-29 – 2011-05-02 (×11): 2 via ORAL
  Administered 2011-05-02: 1 via ORAL
  Filled 2011-04-27 (×12): qty 2

## 2011-04-27 MED ORDER — LACTATED RINGERS IV SOLN
INTRAVENOUS | Status: DC | PRN
  Administered 2011-04-27 (×3): via INTRAVENOUS

## 2011-04-27 MED ORDER — ONDANSETRON HCL 4 MG/2ML IJ SOLN: 4.0000 mg | Freq: Four times a day (QID) | INTRAMUSCULAR | Status: DC | PRN

## 2011-04-27 MED ORDER — BACITRACIN 50000 UNITS IM SOLR
INTRAMUSCULAR | Status: AC
  Filled 2011-04-27: qty 1

## 2011-04-27 MED ORDER — ADULT MULTIVITAMIN W/MINERALS CH
1.0000 | ORAL_TABLET | Freq: Every day | ORAL | Status: DC
  Administered 2011-04-27 – 2011-05-02 (×6): 1 via ORAL
  Filled 2011-04-27 (×6): qty 1

## 2011-04-27 MED ORDER — ONDANSETRON HCL 4 MG/2ML IJ SOLN
INTRAMUSCULAR | Status: DC | PRN
  Administered 2011-04-27: 4 mg via INTRAVENOUS

## 2011-04-27 MED ORDER — SODIUM CHLORIDE 0.9 % IV SOLN: 250.0000 mL | INTRAVENOUS | Status: DC

## 2011-04-27 MED ORDER — DIPHENHYDRAMINE HCL 50 MG/ML IJ SOLN: 12.5000 mg | Freq: Four times a day (QID) | INTRAMUSCULAR | Status: DC | PRN

## 2011-04-27 MED ORDER — KETOROLAC TROMETHAMINE 30 MG/ML IJ SOLN
30.0000 mg | Freq: Four times a day (QID) | INTRAMUSCULAR | Status: AC | PRN
  Administered 2011-04-28: 30 mg via INTRAVENOUS
  Filled 2011-04-27: qty 1

## 2011-04-27 MED ORDER — METHOCARBAMOL 100 MG/ML IJ SOLN
500.0000 mg | INTRAVENOUS | Status: AC
  Administered 2011-04-27: 500 mg via INTRAVENOUS
  Filled 2011-04-27: qty 5

## 2011-04-27 SURGICAL SUPPLY — 60 items
BAG DECANTER FOR FLEXI CONT (MISCELLANEOUS) ×2 IMPLANT
BENZOIN TINCTURE PRP APPL 2/3 (GAUZE/BANDAGES/DRESSINGS) ×2 IMPLANT
BLADE SURG ROTATE 9660 (MISCELLANEOUS) IMPLANT
BUR MATCHSTICK NEURO 3.0 LAGG (BURR) ×2 IMPLANT
CANISTER SUCTION 2500CC (MISCELLANEOUS) ×2 IMPLANT
CAP REVERE LOCKING (Cap) ×18 IMPLANT
CLOTH BEACON ORANGE TIMEOUT ST (SAFETY) ×2 IMPLANT
CONN CROSSLINK REV 38-50MM (Connector) ×2 IMPLANT
CONNECTOR CRSLINK REV 38-50MM (Connector) ×1 IMPLANT
CONT SPEC 4OZ CLIKSEAL STRL BL (MISCELLANEOUS) ×4 IMPLANT
COVER BACK TABLE 24X17X13 BIG (DRAPES) IMPLANT
COVER TABLE BACK 60X90 (DRAPES) ×2 IMPLANT
DRAPE C-ARM 42X72 X-RAY (DRAPES) ×4 IMPLANT
DRAPE LAPAROTOMY 100X72X124 (DRAPES) ×2 IMPLANT
DRAPE POUCH INSTRU U-SHP 10X18 (DRAPES) ×2 IMPLANT
DRAPE SURG 17X23 STRL (DRAPES) ×2 IMPLANT
DRESSING TELFA 8X3 (GAUZE/BANDAGES/DRESSINGS) ×2 IMPLANT
DRSG OPSITE 4X5.5 SM (GAUZE/BANDAGES/DRESSINGS) ×4 IMPLANT
DURAPREP 26ML APPLICATOR (WOUND CARE) ×2 IMPLANT
ELECT REM PT RETURN 9FT ADLT (ELECTROSURGICAL) ×2
ELECTRODE REM PT RTRN 9FT ADLT (ELECTROSURGICAL) ×1 IMPLANT
EVACUATOR 1/8 PVC DRAIN (DRAIN) ×2 IMPLANT
GAUZE SPONGE 4X4 16PLY XRAY LF (GAUZE/BANDAGES/DRESSINGS) IMPLANT
GLOVE BIO SURGEON STRL SZ8 (GLOVE) ×6 IMPLANT
GLOVE BIOGEL PI IND STRL 7.0 (GLOVE) ×1 IMPLANT
GLOVE BIOGEL PI INDICATOR 7.0 (GLOVE) ×1
GLOVE EXAM NITRILE MD LF STRL (GLOVE) ×2 IMPLANT
GLOVE INDICATOR 8.5 STRL (GLOVE) ×2 IMPLANT
GLOVE SURG SS PI 7.0 STRL IVOR (GLOVE) ×4 IMPLANT
GOWN BRE IMP SLV AUR LG STRL (GOWN DISPOSABLE) IMPLANT
GOWN BRE IMP SLV AUR XL STRL (GOWN DISPOSABLE) ×6 IMPLANT
GOWN STRL REIN 2XL LVL4 (GOWN DISPOSABLE) ×2 IMPLANT
HEMOSTAT POWDER KIT SURGIFOAM (HEMOSTASIS) IMPLANT
KIT BASIN OR (CUSTOM PROCEDURE TRAY) ×2 IMPLANT
KIT ROOM TURNOVER OR (KITS) ×2 IMPLANT
NEEDLE BIOPSY T-LOK 11X4 (NEEDLE) ×2 IMPLANT
NEEDLE HYPO 25X1 1.5 SAFETY (NEEDLE) ×2 IMPLANT
NS IRRIG 1000ML POUR BTL (IV SOLUTION) ×2 IMPLANT
PACK FOAM VITOSS 10CC (Orthopedic Implant) ×2 IMPLANT
PACK LAMINECTOMY NEURO (CUSTOM PROCEDURE TRAY) ×2 IMPLANT
PAD ARMBOARD 7.5X6 YLW CONV (MISCELLANEOUS) ×6 IMPLANT
PUTTY BONE DBX 5CC MIX (Putty) ×2 IMPLANT
ROD REVERE 6.35 CURVED 125MM (Rod) ×4 IMPLANT
SCREW REVERE 5.5X45 (Screw) ×4 IMPLANT
SCREW REVERE 6.35 5.5X40MM (Screw) ×4 IMPLANT
SPONGE LAP 4X18 X RAY DECT (DISPOSABLE) IMPLANT
SPONGE SURGIFOAM ABS GEL 100 (HEMOSTASIS) ×2 IMPLANT
STRIP CLOSURE SKIN 1/2X4 (GAUZE/BANDAGES/DRESSINGS) ×4 IMPLANT
SUT VIC AB 0 CT1 18XCR BRD8 (SUTURE) ×1 IMPLANT
SUT VIC AB 0 CT1 8-18 (SUTURE) ×1
SUT VIC AB 2-0 CP2 18 (SUTURE) ×4 IMPLANT
SUT VIC AB 3-0 SH 8-18 (SUTURE) ×4 IMPLANT
SYR 20CC LL (SYRINGE) ×2 IMPLANT
SYR 20ML ECCENTRIC (SYRINGE) ×2 IMPLANT
SYR CONTROL 10ML LL (SYRINGE) ×2 IMPLANT
TOWEL OR 17X24 6PK STRL BLUE (TOWEL DISPOSABLE) ×2 IMPLANT
TOWEL OR 17X26 10 PK STRL BLUE (TOWEL DISPOSABLE) ×2 IMPLANT
TRAP SPECIMEN MUCOUS 40CC (MISCELLANEOUS) ×2 IMPLANT
TRAY FOLEY CATH 14FRSI W/METER (CATHETERS) ×2 IMPLANT
WATER STERILE IRR 1000ML POUR (IV SOLUTION) ×2 IMPLANT

## 2011-04-27 SURGICAL SUPPLY — 17 items
BLADE SURG ROTATE 9660 (MISCELLANEOUS) IMPLANT
COVER BACK TABLE 24X17X13 BIG (DRAPES) IMPLANT
DRAPE C-ARM 42X72 X-RAY (DRAPES) ×4 IMPLANT
DRAPE LAPAROTOMY 100X72X124 (DRAPES) ×2 IMPLANT
DRAPE POUCH INSTRU U-SHP 10X18 (DRAPES) ×2 IMPLANT
DRAPE SURG 17X23 STRL (DRAPES) ×2 IMPLANT
DRSG OPSITE 4X5.5 SM (GAUZE/BANDAGES/DRESSINGS) ×4 IMPLANT
GAUZE SPONGE 4X4 16PLY XRAY LF (GAUZE/BANDAGES/DRESSINGS) IMPLANT
GOWN STRL REIN 2XL LVL4 (GOWN DISPOSABLE) IMPLANT
HEMOSTAT POWDER KIT SURGIFOAM (HEMOSTASIS) IMPLANT
KIT BASIN OR (CUSTOM PROCEDURE TRAY) ×2 IMPLANT
KIT ROOM TURNOVER OR (KITS) ×2 IMPLANT
SPONGE LAP 4X18 X RAY DECT (DISPOSABLE) IMPLANT
SYR 20ML ECCENTRIC (SYRINGE) ×2 IMPLANT
TOWEL OR 17X24 6PK STRL BLUE (TOWEL DISPOSABLE) ×2 IMPLANT
TOWEL OR 17X26 10 PK STRL BLUE (TOWEL DISPOSABLE) ×2 IMPLANT
TRAP SPECIMEN MUCOUS 40CC (MISCELLANEOUS) IMPLANT

## 2011-04-27 NOTE — Anesthesia Postprocedure Evaluation (Signed)
  Anesthesia Post-op Note  Patient: Bruce Olson  Procedure(s) Performed: Procedure(s) (LRB): POSTERIOR LUMBAR FUSION 1 LEVEL (N/A)  Patient Location: PACU  Anesthesia Type: General  Level of Consciousness: awake  Airway and Oxygen Therapy: Patient Spontanous Breathing  Post-op Pain: mild  Post-op Assessment: Post-op Vital signs reviewed  Post-op Vital Signs: stable  Complications: No apparent anesthesia complications

## 2011-04-27 NOTE — H&P (Signed)
Subjective: Patient is a 56 y.o. male admitted for an extension of hardware for an L4 fracture. Onset of symptoms was several weeks ago, gradually worsening since that time.  The pain is rated moderate, and is located at the across the lower back and radiates to legs. The pain is described as aching and occurs intermittently. The symptoms have been progressive. Symptoms are exacerbated by standing for more than a few minutes. MRI or CT showed a transverse fracture across the L4 vertebral body adjacent to the existing pedicle screws with some canal stenosis at L3-4.   Past Medical History  Diagnosis Date  . No pertinent past medical history     Past Surgical History  Procedure Date  . Back surgery     Back surgery 09/15/2010    Prior to Admission medications   Medication Sig Start Date End Date Taking? Authorizing Provider  oxycodone (OXYCONTIN) 30 MG TB12 Take 30 mg by mouth every 8 (eight) hours.   Yes Historical Provider, MD  oxyCODONE-acetaminophen (PERCOCET) 10-325 MG per tablet Take 1 tablet by mouth every 4 (four) hours.   Yes Historical Provider, MD  docusate sodium (COLACE) 100 MG capsule Take 100 mg by mouth daily.    Historical Provider, MD  Multiple Vitamin (MULITIVITAMIN WITH MINERALS) TABS Take 1 tablet by mouth daily.    Historical Provider, MD  pregabalin (LYRICA) 150 MG capsule Take 150 mg by mouth 2 (two) times daily.    Historical Provider, MD  zolpidem (AMBIEN CR) 12.5 MG CR tablet Take 12.5 mg by mouth at bedtime.    Historical Provider, MD   No Known Allergies  History  Substance Use Topics  . Smoking status: Current Everyday Smoker -- 2.0 packs/day for 33 years    Types: Cigarettes  . Smokeless tobacco: Not on file  . Alcohol Use: Yes     beer 3x week    Family History  Problem Relation Age of Onset  . Anesthesia problems Neg Hx   . Hypotension Neg Hx   . Malignant hyperthermia Neg Hx   . Pseudochol deficiency Neg Hx      Review of Systems  Positive ROS:  neg  All other systems have been reviewed and were otherwise negative with the exception of those mentioned in the HPI and as above.  Objective: Vital signs in last 24 hours: Temp:  [97.6 F (36.4 C)] 97.6 F (36.4 C) (03/27 1039) Pulse Rate:  [65] 65  (03/27 1039) Resp:  [18] 18  (03/27 1039) BP: (120)/(71) 120/71 mmHg (03/27 1039) SpO2:  [98 %] 98 % (03/27 1039)  General Appearance: Alert, cooperative, no distress, appears stated age Head: Normocephalic, without obvious abnormality, atraumatic Eyes: PERRL, conjunctiva/corneas clear, EOM's intact, fundi benign, both eyes      Ears: Normal TM's and external ear canals, both ears Throat: Lips, mucosa, and tongue normal; teeth and gums normal Neck: Supple, symmetrical, trachea midline, no adenopathy; thyroid: No enlargement/tenderness/nodules; no carotid bruit or JVD Back: Symmetric, no curvature, ROM normal, no CVA tenderness Lungs: Clear to auscultation bilaterally, respirations unlabored Heart: Regular rate and rhythm, S1 and S2 normal, no murmur, rub or gallop Abdomen: Soft, non-tender, bowel sounds active all four quadrants, no masses, no organomegaly Extremities: Extremities normal, atraumatic, no cyanosis or edema Pulses: 2+ and symmetric all extremities Skin: Skin color, texture, turgor normal, no rashes or lesions  NEUROLOGIC:   Mental status: Alert and oriented x4,  no aphasia, good attention span, fund of knowledge, and memory Motor Exam -  grossly normal Sensory Exam - grossly normal Reflexes: Normal Coordination - grossly normal Gait - grossly normal Balance - grossly normal Cranial Nerves: I: smell Not tested  II: visual acuity  OS: nl    OD: nl  II: visual fields Full to confrontation  II: pupils Equal, round, reactive to light  III,VII: ptosis None  III,IV,VI: extraocular muscles  Full ROM  V: mastication Normal  V: facial light touch sensation  Normal  V,VII: corneal reflex  Present  VII: facial muscle  function - upper  Normal  VII: facial muscle function - lower Normal  VIII: hearing Not tested  IX: soft palate elevation  Normal  IX,X: gag reflex Present  XI: trapezius strength  5/5  XI: sternocleidomastoid strength 5/5  XI: neck flexion strength  5/5  XII: tongue strength  Normal    Data Review Lab Results  Component Value Date   WBC 5.7 04/21/2011   HGB 14.8 04/21/2011   HCT 41.4 04/21/2011   MCV 86.8 04/21/2011   PLT 213 04/21/2011   Lab Results  Component Value Date   NA 138 04/21/2011   K 3.9 04/21/2011   CL 102 04/21/2011   CO2 25 04/21/2011   BUN 6 04/21/2011   CREATININE 0.74 04/21/2011   GLUCOSE 70 04/21/2011   Lab Results  Component Value Date   INR 1.03 04/21/2011    Assessment/Plan: Patient admitted for extension of hardware to L2 across the L4 fracture, with laminectomy L3-4 and intertransverse fusion L2-L4. Patient has failed conservative therapy.  I explained the condition and procedure to the patient and answered any questions.  Patient wishes to proceed with procedure as planned. Understands risks/ benefits and typical outcomes of procedure.   Nael Petrosyan S 04/27/2011 12:35 PM

## 2011-04-27 NOTE — Anesthesia Preprocedure Evaluation (Signed)
Anesthesia Evaluation  Patient identified by MRN, date of birth, ID band  Reviewed: Allergy & Precautions, H&P , NPO status , Patient's Chart, lab work & pertinent test results  Airway Mallampati: II      Dental   Pulmonary Current Smoker,  breath sounds clear to auscultation        Cardiovascular negative cardio ROS  Rhythm:Regular Rate:Normal     Neuro/Psych negative neurological ROS     GI/Hepatic negative GI ROS, Neg liver ROS,   Endo/Other  negative endocrine ROS  Renal/GU negative Renal ROS     Musculoskeletal   Abdominal   Peds  Hematology   Anesthesia Other Findings   Reproductive/Obstetrics                           Anesthesia Physical Anesthesia Plan  ASA: II  Anesthesia Plan: General   Post-op Pain Management:    Induction: Intravenous  Airway Management Planned: Oral ETT  Additional Equipment:   Intra-op Plan:   Post-operative Plan: Extubation in OR  Informed Consent: I have reviewed the patients History and Physical, chart, labs and discussed the procedure including the risks, benefits and alternatives for the proposed anesthesia with the patient or authorized representative who has indicated his/her understanding and acceptance.     Plan Discussed with: CRNA  Anesthesia Plan Comments:         Anesthesia Quick Evaluation

## 2011-04-27 NOTE — Progress Notes (Signed)
Shift report given to P. Shawnie Dapper RN.  Pt has been beligerent and cursing at PACU staff.  Multiple efforts to repositioning, orders for additional pain medication done.  Ice and sips of H2O offered.  Pt frequently encouraged to relax, take deep breaths to assist with pain management; will continue to monitor closely and make efforts to meet pt's needs.

## 2011-04-27 NOTE — Transfer of Care (Signed)
Immediate Anesthesia Transfer of Care Note  Patient: Bruce Olson  Procedure(s) Performed: Procedure(s) (LRB): POSTERIOR LUMBAR FUSION 1 LEVEL (N/A)  Patient Location: PACU  Anesthesia Type: General  Level of Consciousness: awake  Airway & Oxygen Therapy: Patient Spontanous Breathing  Post-op Assessment: Report given to PACU RN  Post vital signs: Reviewed  Complications: No apparent anesthesia complications

## 2011-04-27 NOTE — Transfer of Care (Signed)
Immediate Anesthesia Transfer of Care Note  Patient: Bruce Olson  Procedure(s) Performed: Procedure(s) (LRB): POSTERIOR LUMBAR FUSION 1 LEVEL (N/A)  Patient Location: PACU  Anesthesia Type: General  Level of Consciousness: awake, alert  and oriented  Airway & Oxygen Therapy: Patient Spontanous Breathing and Patient connected to nasal cannula oxygen  Post-op Assessment: Report given to PACU RN, Post -op Vital signs reviewed and stable, Patient moving all extremities and Patient able to stick tongue midline  Post vital signs: Reviewed and stable  Complications: No apparent anesthesia complications

## 2011-04-27 NOTE — Op Note (Signed)
04/27/2011  4:53 PM  PATIENT:  Bruce Olson  56 y.o. male  PRE-OPERATIVE DIAGNOSIS:  L4 vertebral body fracture above a previous L4-S1 fusion with spinal stenosis at L3-4, back and leg pain  POST-OPERATIVE DIAGNOSIS:  Same  PROCEDURE:   1. Decompressive lumbar laminectomy L3-4 for spinal stenosis   . 2. Posterior fixation L2-S1 using globus pedicle screws.  4. Intertransverse arthrodesis L3-4 using morcellized autograft and allograft.  SURGEON:  Marikay Alar, MD  ASSISTANTS: Dr. Wynetta Emery  ANESTHESIA:  General  EBL: 400 ml  Total I/O In: 2575 [I.V.:2200; Blood:125; IV Piggyback:250] Out: 560 [Urine:160; Blood:400]  BLOOD ADMINISTERED:none  DRAINS: Hemovac   INDICATION FOR PROCEDURE: This is a patient who had a 2 level PLIF at L4-5 and L5-S1 7 months ago. He developed back and bilateral anterior thigh pain left greater than right. Plain films followed by MRI suggested a fracture of L4 obliquely through the body. CT scan confirmed this. I recommended extending his hardware up to L2. He had spinal stenosis at L3-4 from retropulsion and also from retrolisthesis. I recommended a decompressive laminectomy for that.  Patient understood the risks, benefits, and alternatives and potential outcomes and wished to proceed.  PROCEDURE DETAILS:  The patient was brought to the operating room. After induction of generalized endotracheal anesthesia the patient was rolled into the prone position on chest rolls and all pressure points were padded. The patient's lumbar region was cleaned and then prepped with DuraPrep and draped in the usual sterile fashion. Anesthesia was injected and then a dorsal midline incision was made and carried down to the lumbosacral fascia. The fascia was opened and the paraspinous musculature was taken down in a subperiosteal fashion to expose L2-3 and L3-4 as well as the previous hardware from L4-S1. I was able to dissect out over the facets of L2-3 and L3-4 and identified  the transverse processes of L2-L3 and L4. I removed the locking caps from the tulip heads from L4-S1 and then removed the rods. The right L4 pedicle screw was loose and therefore it was removed. The remaining pedicle screws were tight and felt adequate.  Intraoperative fluoroscopy confirmed my level, and the spinous process was removed and complete lumbar laminectomies, medial facetectomy and foraminotomies were performed at L3-4 bilaterally. The yellow ligament was removed to expose the underlying dura and nerve roots, and generous foraminotomies were performed to adequately decompress the neural elements. I palpated along the pedicles of L4 to assure adequate decompression of the L4 nerve roots.  Once the decompression was complete, we turned our attention to the posterior fixation. The pedicle screw entry zones were identified utilizing surface landmarks and AP and lateral fluoroscopy. We probed each pedicle with the pedicle probe and tapped each pedicle with the appropriate tap. We palpated with a ball probe to assure no break in the cortex. We then placed 5 5 x 45 mm pedicle screw into the pedicles bilaterally at L3, and 5 5 x 40 mm pedicle screws into the pedicles of L2 bilaterally. We then decorticated the transverse processes and laid a mixture of morcellized autograft and allograft out over these to perform intertransverse arthrodesis at L3-4. Bone marrow aspirate from the right iliac crest was taken from a separate fascial incision to soak the morcellized allograft.We then placed lordotic rods into the multiaxial screw heads of the pedicle screws and locked these in position with the locking caps and anti-torque device.  We then checked our construct with AP and lateral fluoroscopy. Irrigated with copious  amounts of bacitracin-containing saline solution. Placed a medium Hemovac drain through separate stab incision. Inspected the nerve roots once again to assure adequate decompression, lined to the dura  with Gelfoam, and closed the muscle and the fascia with 0 Vicryl. Closed the subcutaneous tissues with 2-0 Vicryl and subcuticular tissues with 3-0 Vicryl. The skin was closed with benzoin and Steri-Strips. Dressing was then applied, the patient was awakened from general anesthesia and transported to the recovery room in stable condition. At the end of the procedure all sponge, needle and instrument counts were correct.   PLAN OF CARE: Admit to inpatient   PATIENT DISPOSITION:  PACU - hemodynamically stable.   Delay start of Pharmacological VTE agent (>24hrs) due to surgical blood loss or risk of bleeding:  yes

## 2011-04-27 NOTE — Anesthesia Procedure Notes (Signed)
Procedure Name: Intubation Date/Time: 04/27/2011 1:10 PM Performed by: Luster Landsberg Pre-anesthesia Checklist: Patient identified, Emergency Drugs available, Patient being monitored and Suction available Patient Re-evaluated:Patient Re-evaluated prior to inductionOxygen Delivery Method: Circle system utilized Preoxygenation: Pre-oxygenation with 100% oxygen Intubation Type: IV induction Ventilation: Mask ventilation without difficulty Laryngoscope Size: Mac and 3 Grade View: Grade I Tube type: Oral Tube size: 8.0 mm Number of attempts: 1 Airway Equipment and Method: Stylet Placement Confirmation: ETT inserted through vocal cords under direct vision,  positive ETCO2 and breath sounds checked- equal and bilateral Secured at: 22 cm Tube secured with: Tape Dental Injury: Teeth and Oropharynx as per pre-operative assessment

## 2011-04-28 MED ORDER — HYDROMORPHONE 0.3 MG/ML IV SOLN
INTRAVENOUS | Status: AC
  Administered 2011-04-28
  Filled 2011-04-28: qty 25

## 2011-04-28 MED ORDER — HYDROMORPHONE 0.3 MG/ML IV SOLN
INTRAVENOUS | Status: AC
  Administered 2011-04-28: 17:00:00
  Filled 2011-04-28: qty 25

## 2011-04-28 MED ORDER — HYDROMORPHONE 0.3 MG/ML IV SOLN
INTRAVENOUS | Status: AC
  Administered 2011-04-28: 07:00:00
  Filled 2011-04-28: qty 25

## 2011-04-28 NOTE — Progress Notes (Signed)
Occupational Therapy Evaluation Patient Details Name: Bruce Olson MRN: [id number] DOB: 1955/11/17 Today's Date: 04/28/2011  Problem List: There is no problem list on file for this patient.   Past Medical History:  Past Medical History  Diagnosis Date  . No pertinent past medical history    Past Surgical History:  Past Surgical History  Procedure Date  . Back surgery     Back surgery 09/15/2010    OT Assessment/Plan/Recommendation OT Assessment Clinical Impression Statement: Pt s/p Posterior fixation L2-S1.  Pt unwilling to attempt mobilization past rolling during evaluation.  Will benefit from acute OT services to address below problem list in prep for d/c home with mother.  If pt does not progress toward goals, will recommend SNF. OT Recommendation/Assessment: Patient will need skilled OT in the acute care venue OT Problem List: Decreased strength;Decreased activity tolerance;Decreased knowledge of use of DME or AE;Decreased knowledge of precautions;Pain OT Therapy Diagnosis : Acute pain;Generalized weakness OT Plan OT Frequency: Min 2X/week OT Treatment/Interventions: Self-care/ADL training;DME and/or AE instruction;Therapeutic activities;Patient/family education OT Recommendation Follow Up Recommendations: Home health OT;Supervision/Assistance - 24 hour Equipment Recommended: None recommended by OT Individuals Consulted Consulted and Agree with Results and Recommendations: Patient OT Goals Acute Rehab OT Goals OT Goal Formulation: With patient Time For Goal Achievement: 2 weeks ADL Goals Pt Will Perform Grooming: with supervision;Standing at sink ADL Goal: Grooming - Progress: Goal set today Pt Will Perform Lower Body Bathing: with supervision;Sit to stand from chair;Sit to stand from bed;with adaptive equipment ADL Goal: Lower Body Bathing - Progress: Goal set today Pt Will Perform Lower Body Dressing: with supervision;Sit to stand from chair;Sit to stand from  bed;with adaptive equipment ADL Goal: Lower Body Dressing - Progress: Goal set today Pt Will Transfer to Toilet: with supervision;Ambulation;with DME;3-in-1;Maintaining back safety precautions ADL Goal: Toilet Transfer - Progress: Goal set today Pt Will Perform Toileting - Clothing Manipulation: with supervision;Standing;Sitting on 3-in-1 or toilet ADL Goal: Toileting - Clothing Manipulation - Progress: Goal set today Pt Will Perform Toileting - Hygiene: with supervision;Sit to stand from 3-in-1/toilet;Standing at 3-in-1/toilet ADL Goal: Toileting - Hygiene - Progress: Goal set today Pt Will Perform Tub/Shower Transfer: Tub transfer;Shower seat with back;with DME;Ambulation;with supervision;Maintaining back safety precautions ADL Goal: Tub/Shower Transfer - Progress: Goal set today Miscellaneous OT Goals Miscellaneous OT Goal #1: Pt will perform bed mobility with supervision in prep for EOB ADLs. OT Goal: Miscellaneous Goal #1 - Progress: Goal set today  OT Evaluation Precautions/Restrictions  Precautions Precautions: Back Precaution Booklet Issued: Yes (comment) Precaution Comments: pt educated on 3/3 back precautions Required Braces or Orthoses: Yes Spinal Brace: Applied in sitting position;Lumbar corset Restrictions Weight Bearing Restrictions: No Prior Functioning Home Living Lives With: Other (Comment) (mom) Receives Help From: Family Type of Home: House Home Layout: One level Home Access: Stairs to enter Entrance Stairs-Rails: Left Entrance Stairs-Number of Steps: 3 Bathroom Shower/Tub: Forensic scientist: Handicapped height Bathroom Accessibility: Yes How Accessible: Accessible via walker Home Adaptive Equipment: Walker - rolling;Bedside commode/3-in-1 Prior Function Level of Independence: Independent with basic ADLs;Independent with homemaking with ambulation;Independent with gait;Independent with transfers Able to Take Stairs?: Yes Driving:  Yes Vocation: Workers comp Leisure: Hobbies-yes (Comment) Comments: putting cars together ADL ADL Ambulation Related to ADLs: Pt refusing to mobilize past rolling in bed due to pain. ADL Comments: Anticipate pt will require max assist for LB dressing/bathing. Vision/Perception    Cognition Cognition Arousal/Alertness: Awake/alert Overall Cognitive Status: Appears within functional limits for tasks assessed Orientation Level: Oriented X4 Sensation/Coordination Sensation  Light Touch: Appears Intact Extremity Assessment RUE Assessment RUE Assessment: Within Functional Limits LUE Assessment LUE Assessment: Within Functional Limits Mobility  Bed Mobility Bed Mobility: Yes Rolling Right: 4: Min assist;With rail Rolling Right Details (indicate cue type and reason): VC for sequencing. Min assist at pelvis and shoulder to maintain back precautions. Pt moved extremely slow, taking ~ 5 minutes to roll to one side Rolling Left: 4: Min assist Rolling Left Details (indicate cue type and reason): see above Transfers Transfers: No (Pt adamently refused) Exercises   End of Session OT - End of Session Activity Tolerance: Patient limited by pain Patient left: in bed;with call bell in reach;with family/visitor present Nurse Communication: Mobility status for transfers General Behavior During Session: Restless (slightly agitated) Cognition: [facility] for tasks performed    4:46 PM  04/28/2011 [name] OTR/L Pager [phone] Office [phone]

## 2011-04-28 NOTE — Progress Notes (Signed)
Orthopedic Tech Progress Note Patient Details:  Bruce Olson Jun 30, 1955 [id number]  Patient ID: [name], male   DOB: 1955/09/28, 56 y.o.   MRN: [id number]   Shawnie Pons 04/28/2011, 3:56 PM Called bio tech

## 2011-04-28 NOTE — Progress Notes (Signed)
Pt requesting nicotine patch. MD notified. Orders received for nicoderm.

## 2011-04-28 NOTE — Progress Notes (Signed)
Physical Therapy Evaluation Patient Details Name: Bruce Olson MRN: [id number] DOB: [birth year] Today's Date: 04/28/2011  Problem List: There is no problem list on file for this patient.   Past Medical History:  Past Medical History  Diagnosis Date  . No pertinent past medical history    Past Surgical History:  Past Surgical History  Procedure Date  . Back surgery     Back surgery 09/15/2010    PT Assessment/Plan/Recommendation PT Assessment Clinical Impression Statement: Pt presents with  Posterior fixation L2-S1. Pt in extreme pain and is unwilling to attempt mobilization past rolling. Will reasess further mobility in future sessions. Pt will benefit from skilled PT in the acute care setting in order to maximize functional mobility for a safe d/c home PT Recommendation/Assessment: Patient will need skilled PT in the acute care venue PT Problem List: Decreased activity tolerance;Decreased mobility;Decreased knowledge of use of DME;Decreased safety awareness;Decreased knowledge of precautions;Pain PT Therapy Diagnosis : Acute pain PT Plan PT Frequency: Min 5X/week PT Treatment/Interventions: DME instruction;Gait training;Stair training;Functional mobility training;Therapeutic activities;Therapeutic exercise;Patient/family education PT Recommendation Follow Up Recommendations: Home health PT;Supervision/Assistance - 24 hour Equipment Recommended: None recommended by PT PT Goals  Acute Rehab PT Goals PT Goal Formulation: With patient/family Time For Goal Achievement: 7 days Pt will Roll Supine to Right Side: with modified independence PT Goal: Rolling Supine to Right Side - Progress: Goal set today Pt will Roll Supine to Left Side: with modified independence PT Goal: Rolling Supine to Left Side - Progress: Goal set today Pt will go Supine/Side to Sit: with modified independence PT Goal: Supine/Side to Sit - Progress: Goal set today Pt will go Sit to Supine/Side: with  modified independence PT Goal: Sit to Supine/Side - Progress: Goal set today Pt will go Sit to Stand: with supervision PT Goal: Sit to Stand - Progress: Goal set today Pt will go Stand to Sit: with supervision PT Goal: Stand to Sit - Progress: Goal set today Pt will Transfer Bed to Chair/Chair to Bed: with supervision PT Transfer Goal: Bed to Chair/Chair to Bed - Progress: Goal set today Pt will Ambulate: 16 - 50 feet;with supervision;with least restrictive assistive device PT Goal: Ambulate - Progress: Goal set today Pt will Go Up / Down Stairs: 3-5 stairs;with supervision;with rail(s) PT Goal: Up/Down Stairs - Progress: Goal set today  PT Evaluation Precautions/Restrictions  Precautions Precautions: Back Precaution Booklet Issued: Yes (comment) Precaution Comments: pt educated on 3/3 back precautions Required Braces or Orthoses: Yes Spinal Brace: Applied in sitting position;Lumbar corset Restrictions Weight Bearing Restrictions: No Prior Functioning  Home Living Lives With: Other (Comment) (mom) Receives Help From: Family Type of Home: House Home Layout: One level Home Access: Stairs to enter Entrance Stairs-Rails: Left Entrance Stairs-Number of Steps: 3 Bathroom Shower/Tub: Forensic scientist: Handicapped height Bathroom Accessibility: Yes How Accessible: Accessible via walker Home Adaptive Equipment: Walker - rolling;Bedside commode/3-in-1 Prior Function Level of Independence: Independent with basic ADLs;Independent with homemaking with ambulation;Independent with gait;Independent with transfers Able to Take Stairs?: Yes Driving: Yes Vocation: Workers comp Leisure: Hobbies-yes (Comment) Comments: putting cars together Cognition Cognition Arousal/Alertness: Awake/alert Overall Cognitive Status: Appears within functional limits for tasks assessed Orientation Level: Oriented X4 Sensation/Coordination Sensation Light Touch: Appears  Intact Extremity Assessment RLE Assessment RLE Assessment: Not tested (pt supine, unable to test completely) LLE Assessment LLE Assessment: Not tested Mobility (including Balance) Bed Mobility Bed Mobility: Yes Rolling Right: 4: Min assist;With rail Rolling Right Details (indicate cue type and reason): VC for sequencing. Min  assist at pelvis and shoulder to maintain back precautions. Pt moved extremely slow, taking ~ 5 minutes to roll to one side Rolling Left: 4: Min assist Rolling Left Details (indicate cue type and reason): see above Transfers Transfers: No (pt declined)    Exercise    End of Session General Behavior During Session: Restless Cognition: [facility] for tasks performed  Milana Kidney 04/28/2011, 4:17 PM  04/28/2011 Milana Kidney DPT PAGER: [phone] OFFICE: [phone]

## 2011-04-28 NOTE — Progress Notes (Signed)
Patient ID: Bruce Olson, male   DOB: 04-10-1955, 56 y.o.   MRN: [id number] Subjective: Patient reports considerable back soreness, but denies leg pain except mild L ant quad soreness. Denies N/T/W  Objective: Vital signs in last 24 hours: Temp:  [97.6 F (36.4 C)-99.3 F (37.4 C)] 98.1 F (36.7 C) (03/28 0949) Pulse Rate:  [65-95] 68  (03/28 0949) Resp:  [13-24] 20  (03/28 0949) BP: (106-149)/(57-94) 130/73 mmHg (03/28 0949) SpO2:  [93 %-100 %] 98 % (03/28 0949) Weight:  [56.7 kg (125 lb)] 56.7 kg (125 lb) (03/27 2352)  Intake/Output from previous day: 03/27 0701 - 03/28 0700 In: 3191.3 [I.V.:2816.3; Blood:125; IV Piggyback:250] Out: 1640 [Urine:860; Drains:380; Blood:400] Intake/Output this shift:    Neurologic: Grossly normal, some mvmt limited by pain  Lab Results: Lab Results  Component Value Date   WBC 5.7 04/21/2011   HGB 14.8 04/21/2011   HCT 41.4 04/21/2011   MCV 86.8 04/21/2011   PLT 213 04/21/2011   Lab Results  Component Value Date   INR 1.03 04/21/2011   BMET Lab Results  Component Value Date   NA 138 04/21/2011   K 3.9 04/21/2011   CL 102 04/21/2011   CO2 25 04/21/2011   GLUCOSE 70 04/21/2011   BUN 6 04/21/2011   CREATININE 0.74 04/21/2011   CALCIUM 9.3 04/21/2011    Studies/Results: Dg Lumbar Spine Complete  04/27/2011  *RADIOLOGY REPORT*  Clinical Data: L2-L4 extension of hardware.  LUMBAR SPINE - COMPLETE 4+ VIEW  Comparison: 02/22/2011  Findings: Previous studies showed pedicle screw fusion at L4, L5, and S1.  This has been extended and now includes L2 and L3.  There are bilateral pedicle screws at L2, L3, L4, L5, and S1.  Single screw on the left at L3.  Interbody spacers at L4-5 and L5-S1.  No acute complication.  IMPRESSION: Pedicle screw and rod fusion L2-S1.  Original Report Authenticated By: Camelia Phenes, M.D.    Assessment/Plan: Yet to mobilize, difficult to control pain. PT/OT.   LOS: 1 day    Quintara Bost S 04/28/2011, 9:54  AM

## 2011-04-28 NOTE — Progress Notes (Signed)
UR COMPLETED  

## 2011-04-29 MED ORDER — HYDROMORPHONE 0.3 MG/ML IV SOLN
INTRAVENOUS | Status: AC
  Administered 2011-04-29: 15:00:00
  Filled 2011-04-29: qty 25

## 2011-04-29 NOTE — Progress Notes (Signed)
CARE MANAGEMENT NOTE 04/29/2011  Patient:  Bruce Olson, Bruce Olson   Account Number:  [id number]  Date Initiated:  04/28/2011  Documentation initiated by:  Vance Peper  Subjective/Objective Assessment:   56 yr old male s/p extension of hardware for L4 fracture     Action/Plan:   Spoke with patient. He is under worker's comp. [employer]:  Marchelle Gearing is adj. [phone]  the office is closed until4/1/13. will follow   Anticipated DC Date:     Anticipated DC Plan:           Choice offered to / List presented to:             Status of service:  In process, will continue to follow Medicare Important Message given?   (If response is "NO", the following Medicare IM given date fields will be blank) If discussed at Long Length of Stay Meetings, dates discussed:    Comments:  04/29/11 1130 Vance Peper, RN BSN Case Manager Patient states he has DME, will need HH NA. Will call Patti on Monday.

## 2011-04-29 NOTE — Progress Notes (Signed)
Patient ID: Bruce Olson, male   DOB: 03-17-1955, 56 y.o.   MRN: [id number] Patient definitely looks pretty good. Slowly increasing activity with therapy and nursing. Wound fine. Neuro stable. Plan to increase activity further as tolerated.

## 2011-04-29 NOTE — Progress Notes (Signed)
Patient tried to sit at the bed side, felt very dizzy, waited for 5 minutes, not improved dizziness. Not attempted to walk. Foley still in place.

## 2011-04-29 NOTE — Progress Notes (Signed)
Occupational Therapy Treatment Patient Details Name: Bruce Olson MRN: [id number] DOB: 09/12/1955 Today's Date: 04/29/2011  OT Assessment/Plan OT Assessment/Plan Comments on Treatment Session: Treatment limited due to pt request to finish lunch. Plan to return later this afternoon to continue education and to perform functional transfers.   OT Plan: Discharge plan remains appropriate OT Frequency: Min 2X/week Follow Up Recommendations: Home health OT;Supervision/Assistance - 24 hour Equipment Recommended: None recommended by OT OT Goals ADL Goals Miscellaneous OT Goals Miscellaneous OT Goal #1: Pt will perform bed mobility with supervision in prep for EOB ADLs. OT Goal: Miscellaneous Goal #1 - Progress: Progressing toward goals  OT Treatment Precautions/Restrictions  Precautions Precautions: Back Precaution Booklet Issued: Yes (comment) Precaution Comments: pt educated on 3/3 back precautions Required Braces or Orthoses: Yes Spinal Brace: Applied in sitting position;Lumbar corset Restrictions Weight Bearing Restrictions: No   ADL ADL ADL Comments: Pt donned back brace with min assist sitting EOB. Mobility  Bed Mobility Bed Mobility: Yes Rolling Left: 5: Supervision;With rail Rolling Left Details (indicate cue type and reason): Pt required increased time to complete roll. Left Sidelying to Sit: 4: Min assist;HOB elevated (comment degrees);With rails (HOB 60 degrees) Left Sidelying to Sit Details (indicate cue type and reason): Assist to trunk OOB. Pt required increased time. Sitting - Scoot to [place] of Bed: 5: Supervision Exercises    End of Session OT - End of Session Equipment Utilized During Treatment: Gait belt;Back brace Activity Tolerance: Patient limited by pain Patient left: in bed;with call bell in reach;with family/visitor present Nurse Communication: Mobility status for transfers;Mobility status for ambulation General Behavior During Session: [facility] for  tasks performed Cognition: [facility] for tasks performed  [name]  04/29/2011, 4:39 PM 04/29/2011 [name] OTR/L Pager [phone] Office [phone]

## 2011-04-29 NOTE — Progress Notes (Signed)
PT TREATMENT NOTE   04/29/11 1604  Precautions  Precautions Back  Precaution Booklet Issued Yes (comment)  Precaution Comments pt educated on 3/3 back precautions  Required Braces or Orthoses Yes  Spinal Brace Applied in sitting position;Lumbar corset  Restrictions  Weight Bearing Restrictions No  Bed Mobility  Bed Mobility Yes  Rolling Left 5: Supervision;With rail  Rolling Left Details (indicate cue type and reason) Pt required increased time to complete roll.  Left Sidelying to Sit 4: Min assist;HOB elevated (comment degrees);With rails (HOB 60 degrees)  Left Sidelying to Sit Details (indicate cue type and reason) Assist to trunk OOB. Pt required increased time.  Sitting - Scoot to [place] of Bed 5: Supervision  Transfers  Transfers No  PT - End of Session  Equipment Utilized During Treatment Gait belt  Activity Tolerance Patient tolerated treatment well  Patient left in bed;Other (comment);with family/visitor present;with call bell in reach (sitting with lunch)  Nurse Communication Mobility status for transfers  General  Behavior During Session [facility] for tasks performed  Cognition [facility] for tasks performed  PT - Assessment/Plan  Comments on Treatment Session Pt progressing well and is able to tolerate increased mobility this session. Upon sitting, pt requested to eat his lunch in sitting. Plan to come back later in the afternoon for transfers and ambulation.  PT Plan Discharge plan remains appropriate;Frequency remains appropriate  PT Frequency Min 5X/week  Follow Up Recommendations Home health PT;Supervision/Assistance - 24 hour  Equipment Recommended None recommended by PT;None recommended by OT  Acute Rehab PT Goals  PT Goal Formulation With patient/family  PT Goal: Rolling Supine to Right Side - Progress Progressing toward goal  PT Goal: Rolling Supine to Left Side - Progress Progressing toward goal  PT Goal: Supine/Side to Sit - Progress Progressing toward goal      04/29/2011 Milana Kidney DPT PAGER: [phone] OFFICE: [phone]

## 2011-04-29 NOTE — Progress Notes (Signed)
PT PROGRESS NOTE   04/29/11 1600  PT Visit Information  Last PT Received On 04/29/11  Precautions  Precautions Back  Precaution Booklet Issued Yes (comment)  Precaution Comments pt educated on 3/3 back precautions  Required Braces or Orthoses Yes  Spinal Brace Applied in sitting position;Lumbar corset  Restrictions  Weight Bearing Restrictions No  Transfers  Transfers Yes  Sit to Stand 4: Min assist;From bed;With upper extremity assist  Sit to Stand Details (indicate cue type and reason) Cueing for safe hand placement  Stand to Sit With armrests;With upper extremity assist;To chair/3-in-1;Other (comment) (min guard assist)  Stand to Sit Details VC for hand placement for safety upon sitting  Ambulation/Gait  Ambulation/Gait Yes  Ambulation/Gait Assistance 4: Min assist  Ambulation/Gait Assistance Details (indicate cue type and reason) Min assist for stability. Pt with improved ambulation with increased distance, although still limited by pain. VC for safe distance to RW as well as postural cues throughout. (two rest breaks needed)  Ambulation Distance (Feet) 50 Feet  Assistive device Rolling walker  Gait Pattern Step-to pattern;Antalgic;Decreased trunk rotation;Decreased stride length  Gait velocity decreased gait speed  Stairs No  PT - End of Session  Equipment Utilized During Treatment Gait belt;Back brace  Activity Tolerance Patient tolerated treatment well  Patient left in chair;with call bell in reach;with family/visitor present  Nurse Communication Mobility status for ambulation;Mobility status for transfers  General  Behavior During Session [facility] for tasks performed  Cognition [facility] for tasks performed  PT - Assessment/Plan  Comments on Treatment Session Pt ambulated with only min assist, although still in a lot of pain limiting distance. Continue per plan, attempt stairs next session prior to d/c  PT Plan Discharge plan remains appropriate;Frequency remains appropriate  PT  Frequency Min 5X/week  Follow Up Recommendations Home health PT;Supervision/Assistance - 24 hour  Equipment Recommended None recommended by OT  Acute Rehab PT Goals  PT Goal: Sit to Stand - Progress Progressing toward goal  PT Goal: Stand to Sit - Progress Progressing toward goal  PT Transfer Goal: Bed to Chair/Chair to Bed - Progress Progressing toward goal  PT Goal: Ambulate - Progress Progressing toward goal    04/29/2011 Milana Kidney DPT PAGER: [phone] OFFICE: [phone]

## 2011-04-29 NOTE — Progress Notes (Signed)
Occupational Therapy Treatment Patient Details Name: Bruce Olson MRN: [id number] DOB: Sep 17, 1955 Today's Date: 04/29/2011  OT Assessment/Plan OT Assessment/Plan Comments on Treatment Session: Pt verbalizing desire to continue increased OOB activity.  Educated pt to request RN to assist with ambulation and transfers.   OT Plan: Discharge plan remains appropriate OT Frequency: Min 2X/week Follow Up Recommendations: Home health OT;Supervision/Assistance - 24 hour Equipment Recommended: None recommended by OT OT Goals ADL Goals Pt Will Transfer to Toilet: with supervision;Ambulation;with DME;3-in-1;Maintaining back safety precautions ADL Goal: Toilet Transfer - Progress: Progressing toward goals Miscellaneous OT Goals   OT Treatment Precautions/Restrictions  Precautions Precautions: Back Precaution Booklet Issued: Yes (comment) Precaution Comments: pt educated on 3/3 back precautions Required Braces or Orthoses: Yes Spinal Brace: Applied in sitting position;Lumbar corset Restrictions Weight Bearing Restrictions: No   ADL ADL Toilet Transfer: Simulated;Minimal assistance Toilet Transfer Details (indicate cue type and reason): Min assist for safe use of RW during turns. Toilet Transfer Method: Proofreader: Other (comment) (chair) Ambulation Related to ADLs: Pt requesting to ambulate to nurse's station.  Min verbal cueing to maintain upright posture. ADL Comments: Pt donned back brace with min assist sitting EOB. Mobility   Transfers Transfers: Yes Sit to Stand: 4: Min assist;From bed;With upper extremity assist Sit to Stand Details (indicate cue type and reason): Cueing for safe hand placement Stand to Sit: With armrests;With upper extremity assist;To chair/3-in-1;Other (comment) (min guard assist) Exercises    End of Session OT - End of Session Equipment Utilized During Treatment: Gait belt;Back brace Activity Tolerance: Patient limited by  pain Patient left: in chair;with call bell in reach;with family/visitor present Nurse Communication: Mobility status for transfers;Mobility status for ambulation General Behavior During Session: [facility] for tasks performed Cognition: [facility] for tasks performed  [name]  04/29/2011, 4:41 PM 04/29/2011 [name] OTR/L Pager [phone] Office [phone]

## 2011-04-30 MED ORDER — HYDROMORPHONE 0.3 MG/ML IV SOLN
INTRAVENOUS | Status: AC
  Filled 2011-04-30: qty 25

## 2011-04-30 MED ORDER — OXYCODONE HCL 5 MG PO TABS
10.0000 mg | ORAL_TABLET | ORAL | Status: DC | PRN
  Administered 2011-04-30 – 2011-05-01 (×4): 10 mg via ORAL
  Filled 2011-04-30 (×6): qty 2

## 2011-04-30 NOTE — Progress Notes (Signed)
Occupational Therapy Treatment Patient Details Name: Bruce Olson MRN: [id number] DOB: Jun 23, 1955 Today's Date: 04/30/2011  OT Assessment/Plan OT Assessment/Plan OT Plan: Discharge plan remains appropriate Follow Up Recommendations: Home health OT;Supervision/Assistance - 24 hour Equipment Recommended: None recommended by PT;None recommended by OT OT Goals ADL Goals Pt Will Perform Tub/Shower Transfer: Tub transfer;Shower seat with back;with DME;Ambulation;Maintaining back safety precautions;with modified independence ADL Goal: Tub/Shower Transfer - Progress: Updated due to goal met Miscellaneous OT Goals OT Goal: Miscellaneous Goal #1 - Progress: Progressing toward goals  OT Treatment Precautions/Restrictions  Precautions Precautions: Back Precaution Comments: pt able to I'ly recall 3/3 back precautions Spinal Brace: Applied in sitting position;Lumbar corset Restrictions Weight Bearing Restrictions: No   ADL ADL Tub/Shower Transfer: Simulated;Supervision/safety Tub/Shower Transfer Details (indicate cue type and reason): simulated in room with trash can. Pt required VC for foot placement as pt originally not allowing for enough room to safely bring second leg into "tub"  Tub/Shower Transfer Method: Ambulating Equipment Used: Rolling walker Ambulation Related to ADLs: Supervision with RW ambulation ADL Comments: Pt donned back brace sitting EOB I'ly. Pt reports that he owns all necessary AE and is comfortable with using. Mother in room states she will go to patient's house and pick up AE and 3n1 to bring to her house as this is where pt is to d/c Mobility  Bed Mobility Rolling Left: 6: Modified independent (Device/Increase time);With rail (HOB elevated to 20degrees) Left Sidelying to Sit: 5: Supervision;With rails Transfers Sit to Stand: 5: Supervision;From bed Stand to Sit: 5: Supervision;With armrests;To chair/3-in-1  End of Session OT - End of Session Equipment  Utilized During Treatment: Gait belt;Back brace Activity Tolerance: Patient tolerated treatment well  Bruce Olson  04/30/2011, 11:44 AM

## 2011-04-30 NOTE — Progress Notes (Signed)
Patient ID: Bruce Olson, male   DOB: August 05, 1955, 56 y.o.   MRN: [id number] Subjective:  The patient is alert and pleasant. He complains of severe back pain. He does not feel he is getting adequate control despite a PCA pump he maxes out on his Percocet because of the Tylenol content. A Foley catheter was removed this morning but he has not urinated yet.  Objective: Vital signs in last 24 hours: Temp:  [97.8 F (36.6 C)-98.3 F (36.8 C)] 97.9 F (36.6 C) (03/30 0935) Pulse Rate:  [61-72] 66  (03/30 0935) Resp:  [14-20] 18  (03/30 0935) BP: (115-134)/(57-72) 118/57 mmHg (03/30 0935) SpO2:  [94 %-99 %] 97 % (03/30 0935)  Intake/Output from previous day: 03/29 0701 - 03/30 0700 In: 300 [P.O.:300] Out: 1250 [Urine:1250] Intake/Output this shift: Total I/O In: 240 [P.O.:240] Out: 680 [Urine:600; Drains:80]  Physical exam the patient is alert and oriented. He is moving his lower extremity is well. Hemovac was removed today.  Lab Results: No results found for this basename: WBC:2,HGB:2,HCT:2,PLT:2 in the last 72 hours BMET No results found for this basename: NA:2,K:2,CL:2,CO2:2,GLUCOSE:2,BUN:2,CREATININE:2,CALCIUM:2 in the last 72 hours  Studies/Results: No results found.  Assessment/Plan: Postop day #3: We will continue his PCA pump. I will add OxyIR for pain management.  Urinary retention: This may have resolved.  LOS: 3 days     [name] D 04/30/2011, 11:30 AM

## 2011-04-30 NOTE — Progress Notes (Signed)
Foley catheter removed

## 2011-04-30 NOTE — Progress Notes (Signed)
Physical Therapy Treatment Patient Details Name: Bruce Olson MRN: [id number] DOB: 01/23/1956 Today's Date: 04/30/2011  PT Assessment/Plan  PT - Assessment/Plan Comments on Treatment Session: Good progress today with increased gait distance, less assitance/cues needed and stair education completed. PT Plan: Discharge plan remains appropriate;Frequency remains appropriate PT Frequency: Min 5X/week Follow Up Recommendations: Home health PT;Supervision/Assistance - 24 hour Equipment Recommended: None recommended by PT;None recommended by OT PT Goals  Acute Rehab PT Goals PT Goal: Rolling Supine to Right Side - Progress: Progressing toward goal PT Goal: Sit to Supine/Side - Progress: Progressing toward goal PT Goal: Stand to Sit - Progress: Met PT Goal: Ambulate - Progress: Met PT Goal: Up/Down Stairs - Progress: Met  PT Treatment Precautions/Restrictions  Precautions Precautions: Back Precaution Booklet Issued: Yes (comment) Precaution Comments: pt able to recall 3/3 back precautions without cues/assist. demo'd with activity without cues/assist Required Braces or Orthoses: Yes Spinal Brace: Lumbar corset;Applied in sitting position (pt independent with brace) Restrictions Weight Bearing Restrictions: No Mobility (including Balance) Bed Mobility Rolling Right: 5: Supervision Rolling Right Details (indicate cue type and reason): bed flat and no rails. rolled from left sidelying toward right side to lie on back. Rolling Left: 6: Modified independent (Device/Increase time);With rail (HOB elevated to 20degrees) Left Sidelying to Sit: 5: Supervision;With rails Sit to Sidelying Left: 5: Supervision;HOB flat Sit to Sidelying Left Details (indicate cue type and reason): no rails and bed flat. pt able to control trunk descent with lying down and elevated both legs onto bed without assistance. Transfers Sit to Stand: 5: Supervision;From bed Sit to Stand Details (indicate cue type and  reason): pt up ambulating in hall with NT when PTA took over for PT session. sit to stand not observed. Stand to Sit: 5: Supervision;With upper extremity assist Stand to Sit Details: min cues for hand placement for safety and to control descent with sitting down onto bed. Ambulation/Gait Ambulation/Gait Assistance: 5: Supervision Ambulation/Gait Assistance Details (indicate cue type and reason): cues for upright posture and to stay with walker, especially with turns Ambulation Distance (Feet): 200 Feet (not including distance pt walked with NT before PT session) Assistive device: Rolling walker Gait Pattern: Step-through pattern;Decreased stride length;Trunk flexed Stairs: Yes Stairs Assistance: 5: Supervision Stairs Assistance Details (indicate cue type and reason): min cues for posture and sequency/technique with stairs. Stair Management Technique: One rail Left;Step to pattern;Forwards Number of Stairs: 3   Posture/Postural Control Posture/Postural Control: No significant limitations Exercise    End of Session PT - End of Session Equipment Utilized During Treatment: Gait belt;Back brace Activity Tolerance: Patient tolerated treatment well Patient left: in bed;with call bell in reach Nurse Communication: Mobility status for transfers;Mobility status for ambulation General Behavior During Session: [facility] for tasks performed Cognition: [facility] for tasks performed  Sallyanne Kuster 04/30/2011, 12:56 PM  Sallyanne Kuster, PTA Office- [phone] Pager- [phone]

## 2011-04-30 NOTE — Progress Notes (Signed)
Pt states pain remains 8 to 8.5 through out today, but would be more if pain meds had not been given

## 2011-05-01 NOTE — Progress Notes (Signed)
Patient ID: Bruce Olson, male   DOB: 1955/09/22, 56 y.o.   MRN: [id number] Subjective: Patient reports Feeling much better  Objective: Vital signs in last 24 hours: Temp:  [97.8 F (36.6 C)-98.3 F (36.8 C)] 97.8 F (36.6 C) (03/31 0600) Pulse Rate:  [64-74] 64  (03/31 0600) Resp:  [1-18] 17  (03/31 0800) BP: (107-127)/(46-70) 110/68 mmHg (03/31 0600) SpO2:  [93 %-100 %] 96 % (03/31 0800)  Intake/Output from previous day: 03/30 0701 - 03/31 0700 In: 462 [P.O.:462] Out: 1580 [Urine:1500; Drains:80] Intake/Output this shift:    Awake, alert. No new neuro issues  Lab Results: No results found for this basename: WBC:2,HGB:2,HCT:2,PLT:2 in the last 72 hours BMET No results found for this basename: NA:2,K:2,CL:2,CO2:2,GLUCOSE:2,BUN:2,CREATININE:2,CALCIUM:2 in the last 72 hours  Studies/Results: No results found.  Assessment/Plan: Doing much better. Will d/c PCA and plan d/c tomorrow.  LOS: 4 days  As above   Reinaldo Meeker, MD 05/01/2011, 10:01 AM

## 2011-05-02 MED ORDER — OXYCODONE HCL 10 MG PO TABS: 10.0000 mg | ORAL_TABLET | ORAL | Status: AC | PRN

## 2011-05-02 MED ORDER — OXYCODONE HCL 20 MG PO TB12: 20.0000 mg | ORAL_TABLET | Freq: Two times a day (BID) | ORAL | Status: AC

## 2011-05-02 NOTE — Progress Notes (Signed)
Pt discharged home, given discharge instructions, patient stable

## 2011-05-02 NOTE — Discharge Summary (Signed)
Physician Discharge Summary  Patient ID: IRVING BLOOR MRN: [id number] DOB/AGE: 56-Jun-1957 56 y.o.  Admit date: 04/27/2011 Discharge date: 05/02/2011  Admission Diagnoses:  Discharge Diagnoses:  Active Problems:  * No active hospital problems. *    Discharged Condition: good  Hospital Course: Surgery with multi level lumbar fusion. Did well. Steadily increased activity. Wound healed well. Home POD 5. Specific instructions given.  Consults: None  Significant Diagnostic Studies: none  Treatments: Multi level lumbar fusion  Discharge Exam: Blood pressure 115/72, pulse 95, temperature 97.8 F (36.6 C), temperature source Oral, resp. rate 20, height 5\' 6"  (1.676 m), weight 54.3 kg (119 lb 11.4 oz), SpO2 95.00%. Incision/Wound:Healing well. Neuro stable  Disposition: 01-Home or Self Care  Discharge Orders    Future Orders Please Complete By Expires   Diet general      Discharge instructions      Comments:   Mostly bedrest. Get up 9 or 10 times each day and walk for 15-20 minutes each time. Very little sitting the first week. No riding in the car until your first post op appointment. If you had neck surgery...may shower from the chest down. If you had low back surgery....you may shower with a saran wrap covering over the incision. Take your pain medicine as needed...and other medicines that you are instructed to take. Call for an appointment...[phone].   Call MD for:  temperature >100.4      Call MD for:  persistant nausea and vomiting      Call MD for:  severe uncontrolled pain      Call MD for:  redness, tenderness, or signs of infection (pain, swelling, redness, odor or green/yellow discharge around incision site)      Call MD for:  difficulty breathing, headache or visual disturbances      Call MD for:  hives        Medication List  As of 05/02/2011  9:15 AM   TAKE these medications         docusate sodium 100 MG capsule   Commonly known as: COLACE   Take 100 mg by  mouth daily.      mulitivitamin with minerals Tabs   Take 1 tablet by mouth daily.      oxycodone 30 MG Tb12   Commonly known as: OXYCONTIN   Take 30 mg by mouth every 8 (eight) hours.      Oxycodone HCl 10 MG Tabs   Take 1 tablet (10 mg total) by mouth every 4 (four) hours as needed.      oxyCODONE 20 MG 12 hr tablet   Commonly known as: OXYCONTIN   Take 1 tablet (20 mg total) by mouth every 12 (twelve) hours.      oxyCODONE-acetaminophen 10-325 MG per tablet   Commonly known as: PERCOCET   Take 1 tablet by mouth every 4 (four) hours.      pregabalin 150 MG capsule   Commonly known as: LYRICA   Take 150 mg by mouth 2 (two) times daily.      zolpidem 12.5 MG CR tablet   Commonly known as: AMBIEN CR   Take 12.5 mg by mouth at bedtime.             At home rest most of the time. Get up 9 or 10 times each day and take a 15 or 20 minute walk. No riding in the car and to your first postoperative appointment. If you have neck surgery you may shower from the  chest down starting on the third postoperative day. If you had back surgery he may start showering on the third postoperative day with saran wrap wrapped around your incisional area 3 times. After the shower remove the saran wrap. Take pain medicine as needed and other medications as instructed. Call my office for an appointment.  SignedReinaldo Meeker, MD 05/02/2011, 9:15 AM

## 2011-05-02 NOTE — Progress Notes (Signed)
CARE MANAGEMENT NOTE 05/02/2011  Comments:  05/02/11 1005 Vance Peper, RN BSN Case Manager Grier Rocher is RN Case Manager, states to use [facility] for home health needs. Called [facility], spoke with [place]. They will arrange for nurse's aide

## 2011-12-19 ENCOUNTER — Encounter: Payer: Self-pay | Admitting: Physical Medicine & Rehabilitation

## 2012-01-10 ENCOUNTER — Ambulatory Visit (HOSPITAL_BASED_OUTPATIENT_CLINIC_OR_DEPARTMENT_OTHER): Payer: Worker's Compensation | Admitting: Physical Medicine & Rehabilitation

## 2012-01-10 ENCOUNTER — Encounter: Payer: Self-pay | Admitting: Physical Medicine & Rehabilitation

## 2012-01-10 ENCOUNTER — Encounter: Payer: Worker's Compensation | Attending: Physical Medicine & Rehabilitation

## 2012-01-10 VITALS — BP 142/64 | HR 69 | Resp 14 | Ht 66.0 in | Wt 122.0 lb

## 2012-01-10 DIAGNOSIS — F17200 Nicotine dependence, unspecified, uncomplicated: Secondary | ICD-10-CM | POA: Insufficient documentation

## 2012-01-10 DIAGNOSIS — Z5181 Encounter for therapeutic drug level monitoring: Secondary | ICD-10-CM

## 2012-01-10 DIAGNOSIS — IMO0002 Reserved for concepts with insufficient information to code with codable children: Secondary | ICD-10-CM

## 2012-01-10 DIAGNOSIS — M5416 Radiculopathy, lumbar region: Secondary | ICD-10-CM | POA: Insufficient documentation

## 2012-01-10 DIAGNOSIS — Z981 Arthrodesis status: Secondary | ICD-10-CM | POA: Insufficient documentation

## 2012-01-10 DIAGNOSIS — M961 Postlaminectomy syndrome, not elsewhere classified: Secondary | ICD-10-CM

## 2012-01-10 DIAGNOSIS — M549 Dorsalgia, unspecified: Secondary | ICD-10-CM

## 2012-01-10 MED ORDER — PREGABALIN 100 MG PO CAPS: 100.0000 mg | ORAL_CAPSULE | Freq: Three times a day (TID) | ORAL | Status: DC

## 2012-01-10 MED ORDER — PREGABALIN 100 MG PO CAPS: 100.0000 mg | ORAL_CAPSULE | Freq: Two times a day (BID) | ORAL | Status: DC

## 2012-01-10 NOTE — Progress Notes (Signed)
Subjective:    Patient ID: Bruce Olson, male    DOB: 1955-06-23, 56 y.o.   MRN: [id number]  HPI Date of injury was in 2002 moving a [employer] machine at work. Then follow her and had immediate onset of pain. Underwent L4-L5 and L5-S1 fusion in 2012. Had a superior endplate fracture L4 and head additional surgery which involve decompressive lumbar laminectomy L3-L4 and posterior fixation L2-S1 as well as bone graft at L3-L4 performed by Dr. Yetta Barre 04/29/2011. Was seen by another physical medicine rehabilitation Dr.Bodea, patient reports having had multiple spine injections. I do not have these records. Patient also was referred to Dr. Murray Hodgkins but I have no records from this visit.  Patient feels like his pain has worsened over the last 3 weeks.Complains of right-sided low back and buttock pain radiating into the lateral thigh on the right side. No new trauma. His next appointment with his neurosurgeon is in one month. Continues to wear a lumbosacral orthosis. Patient has been on oxymorphone as well as oxycodone for pain. Has not tried morphine or fentanyl patch. Has not tried tramadol Also takes Lyrica 75 mg up to 4 tablets per day Pain Inventory Average Pain 8 Pain Right Now 8 My pain is sharp, stabbing and aching  In the last 24 hours, has pain interfered with the following? General activity 2 Relation with others 10 Enjoyment of life 10 What TIME of day is your pain at its worst? constant Sleep (in general) Poor  Pain is worse with: walking, sitting and standing Pain improves with: heat/ice and TENS Relief from Meds: 10  Mobility walk without assistance use a walker how many minutes can you walk? 15 ability to climb steps?  yes do you drive?  yes  Function disabled: date disabled 2012 I need assistance with the following:  bathing, toileting and household duties  Neuro/Psych bowel control problems weakness numbness tremor tingling trouble walking  Prior  Studies Any changes since last visit?  no  Physicians involved in your care Any changes since last visit?  no   Family History  Problem Relation Age of Onset  . Anesthesia problems Neg Hx   . Hypotension Neg Hx   . Malignant hyperthermia Neg Hx   . Pseudochol deficiency Neg Hx    History   Social History  . Marital Status: Single    Spouse Name: N/A    Number of Children: N/A  . Years of Education: N/A   Social History Main Topics  . Smoking status: Current Every Day Smoker -- 2.0 packs/day for 33 years    Types: Cigarettes  . Smokeless tobacco: Never Used  . Alcohol Use: Yes     Comment: beer 3x week  . Drug Use: No  . Sexually Active: No     Comment: back problems   Other Topics Concern  . None   Social History Narrative  . None   Past Surgical History  Procedure Date  . Back surgery     Back surgery 09/15/2010   Past Medical History  Diagnosis Date  . No pertinent past medical history    BP 142/64  Pulse 69  Resp 14  Ht 5\' 6"  (1.676 m)  Wt 122 lb (55.339 kg)  BMI 19.69 kg/m2  SpO2 98%    Review of Systems  Constitutional: Positive for diaphoresis.  Respiratory: Positive for apnea.   Gastrointestinal: Positive for constipation.  Musculoskeletal: Positive for gait problem.  Neurological: Positive for tremors and numbness.  Tingling  All other systems reviewed and are negative.       Objective:   Physical Exam  Constitutional: He is oriented to person, place, and time. He appears well-developed and well-nourished.  HENT:  Head: Normocephalic and atraumatic.  Eyes: EOM are normal. Pupils are equal, round, and reactive to light.  Musculoskeletal:       Lumbar back: He exhibits decreased range of motion and tenderness. He exhibits no swelling, no edema and no deformity.       Back:  Neurological: He is alert and oriented to person, place, and time. He has normal strength. He displays tremor. A sensory deficit is present. Gait abnormal.   Reflex Scores:      Tricep reflexes are 3+ on the right side and 3+ on the left side.      Bicep reflexes are 3+ on the right side and 3+ on the left side.      Brachioradialis reflexes are 3+ on the right side and 3+ on the left side.      Patellar reflexes are 3+ on the right side and 3+ on the left side.      Achilles reflexes are 3+ on the left side.      Forward flex short step length no evidence of toe drag or knee instability  Psychiatric: His mood appears anxious. He is slowed.          Assessment & Plan:  1. Lumbar postlaminectomy syndrome,Has been maintained on high-dose narcotic analgesics. He has had postoperative physical therapy. He's had injections prior to surgery however I do not know what type these were. Will request further records. Will increase Lyrica for neurogenic pain Overall strategy is to reduce narcotic analgesic usage to low-dose while maintaining adequate pain relief. Patient reports occasional alcohol which she will need to stop if I am to prescribe any narcotics for him Lyrica 100 mg 3 times per day Check x-rays of the lumbar spine, Including flexion extension  views Pain psychology referral Urine drug screen today Return to clinic 2 weeks Further discussion of pain medication at that time And discussed my recommendations with Devoria Glassing RN, as well as the patient

## 2012-01-10 NOTE — Patient Instructions (Addendum)
Lyrica 100 mg 3 times per day Check x-rays of the lumbar spine Pain psychology referral Urine drug screen today Return to clinic 2 weeks Further discussion of pain medication at that time

## 2012-02-06 ENCOUNTER — Ambulatory Visit: Payer: Worker's Compensation | Admitting: Physical Medicine & Rehabilitation

## 2012-02-14 ENCOUNTER — Ambulatory Visit: Payer: Worker's Compensation | Admitting: Physical Medicine & Rehabilitation

## 2012-02-16 ENCOUNTER — Encounter: Payer: Self-pay | Admitting: Physical Medicine & Rehabilitation

## 2012-02-16 ENCOUNTER — Ambulatory Visit (HOSPITAL_BASED_OUTPATIENT_CLINIC_OR_DEPARTMENT_OTHER): Payer: Worker's Compensation | Admitting: Physical Medicine & Rehabilitation

## 2012-02-16 ENCOUNTER — Encounter: Payer: Worker's Compensation | Attending: Physical Medicine & Rehabilitation

## 2012-02-16 VITALS — BP 145/51 | HR 67 | Resp 14 | Ht 66.0 in | Wt 127.0 lb

## 2012-02-16 DIAGNOSIS — Z981 Arthrodesis status: Secondary | ICD-10-CM | POA: Insufficient documentation

## 2012-02-16 DIAGNOSIS — M961 Postlaminectomy syndrome, not elsewhere classified: Secondary | ICD-10-CM

## 2012-02-16 DIAGNOSIS — M533 Sacrococcygeal disorders, not elsewhere classified: Secondary | ICD-10-CM

## 2012-02-16 DIAGNOSIS — IMO0002 Reserved for concepts with insufficient information to code with codable children: Secondary | ICD-10-CM

## 2012-02-16 DIAGNOSIS — M5416 Radiculopathy, lumbar region: Secondary | ICD-10-CM

## 2012-02-16 DIAGNOSIS — F17200 Nicotine dependence, unspecified, uncomplicated: Secondary | ICD-10-CM | POA: Insufficient documentation

## 2012-02-16 MED ORDER — OXYCODONE-ACETAMINOPHEN 5-325 MG PO TABS: 1.0000 | ORAL_TABLET | Freq: Three times a day (TID) | ORAL | Status: DC | PRN

## 2012-02-16 MED ORDER — FENTANYL 50 MCG/HR TD PT72: 1.0000 | MEDICATED_PATCH | TRANSDERMAL | Status: DC

## 2012-02-16 MED ORDER — DIAZEPAM 10 MG PO TABS: 10.0000 mg | ORAL_TABLET | Freq: Once | ORAL | Status: DC

## 2012-02-16 NOTE — Patient Instructions (Signed)
Please followup with Dr. Zenda Alpers as scheduled Please followup with Dr. Yetta Barre as scheduled See me in 2 weeks for sacroiliac injection Stop Opana Start fentanyl patch. He may take 1 Opana the first time he put on the patch You may continue the oxycodone one tablet 3 times a day

## 2012-02-16 NOTE — Progress Notes (Signed)
Subjective:    Patient ID: Bruce Olson, male    DOB: 16-Oct-1955, 57 y.o.   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 Then follow her and had immediate onset of pain. Underwent L4-L5 and L5-S1 fusion in 2012. Had a superior endplate fracture L4 and head additional surgery which involve decompressive lumbar laminectomy L3-L4 and posterior fixation L2-S1 as well as bone graft at L3-L4 performed by Dr. Yetta Barre 04/29/2011. Was seen by another physical medicine rehabilitation Dr.Bodea, patient reports having had multiple spine injections, 4 at a time this was prior to first surgery. I do not have these records. Patient also was referred to Dr. Murray Hodgkins but I have no records from this visit.  Patient feels like his pain has worsened over the last 3 weeks.Complains of right-sided low back and buttock pain radiating into the lateral thigh on the right side. No new trauma. His next appointment with his neurosurgeon is in one month. Continues to wear a lumbosacral orthosis. Patient has been on oxymorphone as well as oxycodone for pain. Has not tried morphine or fentanyl patch. Has not tried tramadol  Lyrica increased to 100mg  TID helped  Pain Inventory Average Pain 8 Pain Right Now 8 My pain is sharp, stabbing and tingling  In the last 24 hours, has pain interfered with the following? General activity 8 Relation with others 8 Enjoyment of life 8 What TIME of day is your pain at its worst? all the time Sleep (in general) Poor  Pain is worse with: walking, sitting and standing Pain improves with: medication and TENS Relief from Meds: 8  Mobility walk without assistance use a walker how many minutes can you walk? 1/2 mile ability to climb steps?  yes do you drive?  yes  Function disabled: date disabled 05/2010  Neuro/Psych weakness numbness tremor trouble walking  Prior Studies Any changes since last visit?  no  Physicians involved in your  care Any changes since last visit?  no   Family History  Problem Relation Age of Onset  . Anesthesia problems Neg Hx   . Hypotension Neg Hx   . Malignant hyperthermia Neg Hx   . Pseudochol deficiency Neg Hx    History   Social History  . Marital Status: Single    Spouse Name: N/A    Number of Children: N/A  . Years of Education: N/A   Social History Main Topics  . Smoking status: Current Every Day Smoker -- 2.0 packs/day for 33 years    Types: Cigarettes  . Smokeless tobacco: Never Used  . Alcohol Use: Yes     Comment: beer 3x week  . Drug Use: No  . Sexually Active: No     Comment: back problems   Other Topics Concern  . None   Social History Narrative  . None   Past Surgical History  Procedure Date  . Back surgery     Back surgery 09/15/2010   Past Medical History  Diagnosis Date  . No pertinent past medical history    BP 145/51  Pulse 67  Resp 14  Ht 5\' 6"  (1.676 m)  Wt 127 lb (57.607 kg)  BMI 20.50 kg/m2  SpO2 99%    Review of Systems  Constitutional: Positive for unexpected weight change.  Respiratory: Positive for apnea and shortness of breath.   Musculoskeletal: Positive for back pain and gait problem.  Neurological: Positive for tremors, weakness and numbness.  All other systems reviewed  and are negative.       Objective:   Physical Exam  Nursing note and vitals reviewed. Constitutional: He is oriented to person, place, and time. He appears well-developed and well-nourished.  HENT:  Head: Normocephalic and atraumatic.  Eyes: Conjunctivae normal and EOM are normal. Pupils are equal, round, and reactive to light.  Neurological: He is alert and oriented to person, place, and time. He has normal reflexes.  Psychiatric: His mood appears anxious.   Musculoskeletal:  Lumbar back: He exhibits decreased range of motion and tenderness. He exhibits no swelling, no edema and no deformity.  Back:  Neurological: He is alert and oriented to  person, place, and time. He has normal strength. He displays tremor. A sensory deficit is present. Gait abnormal.  Reflex Scores:  Tricep reflexes are 3+ on the right side and 3+ on the left side.  Bicep reflexes are 3+ on the right side and 3+ on the left side.  Brachioradialis reflexes are 3+ on the right side and 3+ on the left side.  Patellar reflexes are 3+ on the right side and 3+ on the left side.  Achilles reflexes are 3+ on the left side. Forward flex short step length no evidence of toe drag or knee instability        Assessment & Plan:   1. Lumbar postlaminectomy syndrome,Has been maintained on high-dose narcotic analgesics. He has had postoperative physical therapy. He's had injections prior to surgery however I do not know what type these were. Will request further records.  Will increase Lyrica for neurogenic pain  Overall strategy is to reduce narcotic analgesic usage to low-dose while maintaining adequate pain relief.  Discontinue Opana ER 20 mg twice a day Start fentanyl patch 50 mcg every 72 hours Reduce oxycodone 5 mg 3 times a day when necessary Patient reports occasional alcohol which she will need to stop if I am to prescribe any narcotics for him  Lyrica 100 mg 3 times per day  Recheck x-rays of the lumbar spine, Including flexion extension views Look okay, neurosurgery to evaluate next week Pain psychology referral  Urine drug screen Last visit was appropriate Return to clinic 2 weeks For sacroiliac injection Further discussion of pain medication at that time  And discussed my recommendations with Devoria Glassing RN, as well as the patient

## 2012-03-06 ENCOUNTER — Ambulatory Visit (HOSPITAL_BASED_OUTPATIENT_CLINIC_OR_DEPARTMENT_OTHER): Payer: Worker's Compensation | Admitting: Physical Medicine & Rehabilitation

## 2012-03-06 ENCOUNTER — Encounter: Payer: Worker's Compensation | Attending: Physical Medicine & Rehabilitation

## 2012-03-06 ENCOUNTER — Encounter: Payer: Self-pay | Admitting: Physical Medicine & Rehabilitation

## 2012-03-06 VITALS — BP 139/76 | HR 84 | Resp 14 | Ht 66.0 in | Wt 125.4 lb

## 2012-03-06 DIAGNOSIS — M961 Postlaminectomy syndrome, not elsewhere classified: Secondary | ICD-10-CM | POA: Insufficient documentation

## 2012-03-06 DIAGNOSIS — Z981 Arthrodesis status: Secondary | ICD-10-CM | POA: Insufficient documentation

## 2012-03-06 DIAGNOSIS — F17200 Nicotine dependence, unspecified, uncomplicated: Secondary | ICD-10-CM | POA: Insufficient documentation

## 2012-03-06 DIAGNOSIS — M533 Sacrococcygeal disorders, not elsewhere classified: Secondary | ICD-10-CM

## 2012-03-06 MED ORDER — OXYCODONE-ACETAMINOPHEN 5-325 MG PO TABS: 1.0000 | ORAL_TABLET | Freq: Three times a day (TID) | ORAL | Status: DC | PRN

## 2012-03-06 MED ORDER — NORTRIPTYLINE HCL 10 MG PO CAPS: 10.0000 mg | ORAL_CAPSULE | Freq: Every day | ORAL | Status: DC

## 2012-03-06 MED ORDER — FENTANYL 50 MCG/HR TD PT72: 1.0000 | MEDICATED_PATCH | TRANSDERMAL | Status: DC

## 2012-03-06 NOTE — Patient Instructions (Addendum)
Please change Fentanyl patch every 48 hours Nortriptyline is for sleep as well as for chronic pain Continue oxycodone 5 mg 3 times per day  Please keep track of your pain to see how much of your pain goes away in the right buttock area.

## 2012-03-06 NOTE — Progress Notes (Signed)
  PROCEDURE RECORD [facility]   Name: Bruce Olson DOB:April 08, 1955 MRN: [id number]  Date:03/06/2012  Physician: Claudette Laws, MD    Nurse/CMA: Shumaker RN  Allergies: No Known Allergies  Consent Signed: yes  Is patient diabetic? no  CBG today?   Pregnant: no LMP: No LMP for male patient. (age 57-55)  Anticoagulants: no Anti-inflammatory: no Antibiotics: no  Procedure: right sacroilac steroid injection Position: Prone Start Time: 12:54 End Time: 12:57  Fluoro Time: 10 seconds  RN/CMA Environmental education officer    Time 12:08 1:00    BP 139/76 133/68    Pulse 84 74    Respirations 14 14    O2 Sat 97 96    S/S 6 6    Pain Level 8/10 8/10     D/C home with his mother, [name] & O X 3, D/C instructions reviewed, and sits independently.

## 2012-03-21 ENCOUNTER — Telehealth: Payer: Self-pay | Admitting: *Deleted

## 2012-03-21 NOTE — Telephone Encounter (Signed)
Bruce Olson left message that workman's comp was not going to pay for oxycodone.  When I returned his call, they have now paid for it.  He wants Bruce Olson to know that Bruce Olson says the previous surgery was not healing properly and he wants to go back in and put another pin in.  Bruce Olson did request Bruce Olson send our office his notes.

## 2012-04-03 ENCOUNTER — Encounter: Payer: Worker's Compensation | Attending: Physical Medicine & Rehabilitation

## 2012-04-03 ENCOUNTER — Ambulatory Visit (HOSPITAL_BASED_OUTPATIENT_CLINIC_OR_DEPARTMENT_OTHER): Payer: Worker's Compensation | Admitting: Physical Medicine & Rehabilitation

## 2012-04-03 ENCOUNTER — Encounter: Payer: Self-pay | Admitting: Physical Medicine & Rehabilitation

## 2012-04-03 VITALS — BP 152/72 | HR 70 | Resp 14 | Ht 66.0 in | Wt 123.8 lb

## 2012-04-03 DIAGNOSIS — Z981 Arthrodesis status: Secondary | ICD-10-CM | POA: Insufficient documentation

## 2012-04-03 DIAGNOSIS — F17200 Nicotine dependence, unspecified, uncomplicated: Secondary | ICD-10-CM | POA: Insufficient documentation

## 2012-04-03 DIAGNOSIS — M961 Postlaminectomy syndrome, not elsewhere classified: Secondary | ICD-10-CM

## 2012-04-03 MED ORDER — OXYCODONE-ACETAMINOPHEN 7.5-325 MG PO TABS: 1.0000 | ORAL_TABLET | ORAL | Status: DC | PRN

## 2012-04-03 MED ORDER — OXYMORPHONE HCL ER 20 MG PO TB12: 20.0000 mg | ORAL_TABLET | Freq: Two times a day (BID) | ORAL | Status: DC

## 2012-04-03 MED ORDER — PREGABALIN 100 MG PO CAPS: 100.0000 mg | ORAL_CAPSULE | Freq: Three times a day (TID) | ORAL | Status: DC

## 2012-04-03 NOTE — Patient Instructions (Signed)
Discontinue fentanyl patch Discontinue Percocet 5 mg Start Opana ER 20 mg twice a day Start Percocet 7.5mg  times per day If you have surgery and it is helpful we can wean these medications I recommend a pain psychologist to evaluate You'll see my assistant Clydie Braun next visit I will give you a DVD on spinal cord stimulation. If you decide to try spinal cord stimulation make an appointment with me.

## 2012-04-03 NOTE — Progress Notes (Signed)
Subjective:    Patient ID: Bruce Olson, male    DOB: 06/27/1955, 57 y.o.   MRN: [id number]  HPI Date of injury was in 2002 moving a Coke machine at work. Then follow her and had immediate onset of pain. Underwent L4-L5 and L5-S1 fusion in 2012. Had a superior endplate fracture L4 and head additional surgery which involve decompressive lumbar laminectomy L3-L4 and posterior fixation L2-S1 as well as bone graft at L3-L4 performed by Dr. Yetta Barre 04/29/2011. Was seen by another physical medicine rehabilitation Dr.Bodea, patient reports having had multiple spine injections. I do not have these records. Patient also was referred to Dr. Murray Hodgkins but I have no records from this visit.  Patient feels like his pain has worsened over the last 3 weeks.Complains of right-sided low back and buttock pain radiating into the lateral thigh on the right side. No new trauma. His next appointment with his neurosurgeon is in one month. Continues to wear a lumbosacral orthosis. Left sacroiliac injection performed to 2/ 05/2012. Preinjection pain level 8/ 10 post injection pain level 8/10. Seen by Neurosurgery, Dr. Yetta Barre. I do not have the notes.Patient's understanding is that some type of additional instrumentation may be added. Pain Inventory Average Pain 9 Pain Right Now 9 My pain is sharp, stabbing and tingling  In the last 24 hours, has pain interfered with the following? General activity 9 Relation with others 9 Enjoyment of life 9 What TIME of day is your pain at its worst? ALL Sleep (in general) Poor  Pain is worse with: walking, sitting and standing Pain improves with: medication and TENS Relief from Meds: 3  Mobility walk without assistance use a walker how many minutes can you walk? 10 ability to climb steps?  yes do you drive?  yes  Function disabled: date disabled 05/2010  Neuro/Psych weakness numbness tremor trouble walking  Prior Studies Any changes since last visit?   no  Physicians involved in your care Any changes since last visit?  no   Family History  Problem Relation Age of Onset  . Anesthesia problems Neg Hx   . Hypotension Neg Hx   . Malignant hyperthermia Neg Hx   . Pseudochol deficiency Neg Hx    History   Social History  . Marital Status: Single    Spouse Name: N/A    Number of Children: N/A  . Years of Education: N/A   Social History Main Topics  . Smoking status: Current Every Day Smoker -- 2.00 packs/day for 33 years    Types: Cigarettes  . Smokeless tobacco: Never Used  . Alcohol Use: Yes     Comment: beer 3x week  . Drug Use: No  . Sexually Active: No     Comment: back problems   Other Topics Concern  . None   Social History Narrative  . None   Past Surgical History  Procedure Laterality Date  . Back surgery      Back surgery 09/15/2010   Past Medical History  Diagnosis Date  . No pertinent past medical history    BP 152/72  Pulse 70  Resp 14  Ht 5\' 6"  (1.676 m)  Wt 123 lb 12.8 oz (56.155 kg)  BMI 19.99 kg/m2  SpO2 98%   Review of Systems  Musculoskeletal: Positive for back pain and gait problem.  Neurological: Positive for tremors and weakness.  All other systems reviewed and are negative.       Objective:   Physical Exam  Nursing note and vitals  reviewed. Constitutional: He is oriented to person, place, and time. He appears well-developed and well-nourished.  Eyes: Conjunctivae and EOM are normal. Pupils are equal, round, and reactive to light.  Musculoskeletal:       Lumbar back: He exhibits decreased range of motion and tenderness.  There is tenderness with minimal palpation in the low-back area. There is pain behavior with minimal movement and range of motion. Straight leg raising is negative.   Neurological: He is alert and oriented to person, place, and time. He has normal strength. He displays no atrophy and no tremor. A sensory deficit is present. He exhibits normal muscle tone.  Coordination normal.  Reflex Scores:      Tricep reflexes are 3+ on the right side and 3+ on the left side.      Bicep reflexes are 3+ on the right side and 3+ on the left side.      Brachioradialis reflexes are 3+ on the right side and 3+ on the left side.      Patellar reflexes are 3+ on the right side and 3+ on the left side.      Achilles reflexes are 3+ on the right side and 3+ on the left side. Decreased sensation left L4-5 S1 dermatomal distribution   Psychiatric: He has a normal mood and affect.      Assessment & Plan:  1. Lumbar postlaminectomy syndrome.No evidence of sacroiliac disorder.  Discussed with patient narcotic analgesics can only reduce pain from 10-30%. He may of done better with the Opana ER with fentanyl. We can try going back. Has both back and bilateral leg pain. Noticed more leg pain when he ran out of Lyrica. Will resume Other treatment options include pain psychologist to help with coping with pain Spinal cord stimulation as a potential treatment option if neurosurgery is not planning on doing any further surgery in the near future.

## 2012-04-10 ENCOUNTER — Telehealth: Payer: Self-pay

## 2012-04-10 NOTE — Telephone Encounter (Signed)
Stanton Kidney called and says patient is very angry about his medications.  Something about reordering the opana but insurance has blocked this and they think he is just on the patches.  Stanton Kidney needs last office not regarding appointment and medication change.

## 2012-04-11 NOTE — Telephone Encounter (Signed)
Spoke with patient and he says his workers comp will not fill opana. Patient says it is ok to contact case Production designer, theatre/television/film.  Unable to contact case manager.  Last office notes faxed to [phone]

## 2012-04-12 ENCOUNTER — Telehealth: Payer: Self-pay

## 2012-04-12 NOTE — Telephone Encounter (Signed)
Per Stanton Kidney she is no longer on this case and the patient needs to contact his lawyer.   Left message informing patient of this.

## 2012-04-12 NOTE — Telephone Encounter (Signed)
Patient says to call [phone] to contact case manger.  Tried calling number and it has been disconnected.  Called [phone] and left message for Stanton Kidney to approve opana for patient.

## 2012-04-12 NOTE — Telephone Encounter (Signed)
Patient called to get [place] to contact his workers comp to get them to approve opana.  Last office notes were faxed 2 days ago.  Called patient to get workers comp number but call was dropped.

## 2012-05-01 ENCOUNTER — Encounter
Payer: Worker's Compensation | Attending: Physical Medicine and Rehabilitation | Admitting: Physical Medicine and Rehabilitation

## 2012-05-01 ENCOUNTER — Encounter: Payer: Self-pay | Admitting: Physical Medicine and Rehabilitation

## 2012-05-01 ENCOUNTER — Other Ambulatory Visit: Payer: Self-pay | Admitting: Physical Medicine and Rehabilitation

## 2012-05-01 VITALS — BP 126/72 | HR 74 | Resp 14 | Ht 66.0 in | Wt 121.0 lb

## 2012-05-01 DIAGNOSIS — M961 Postlaminectomy syndrome, not elsewhere classified: Secondary | ICD-10-CM

## 2012-05-01 DIAGNOSIS — F17200 Nicotine dependence, unspecified, uncomplicated: Secondary | ICD-10-CM | POA: Insufficient documentation

## 2012-05-01 DIAGNOSIS — Z981 Arthrodesis status: Secondary | ICD-10-CM | POA: Insufficient documentation

## 2012-05-01 MED ORDER — OXYCODONE-ACETAMINOPHEN 7.5-325 MG PO TABS: 1.0000 | ORAL_TABLET | ORAL | Status: DC | PRN

## 2012-05-01 NOTE — Patient Instructions (Signed)
Continue with your walking program, try to slowly increase the distance .

## 2012-05-01 NOTE — Progress Notes (Signed)
Subjective:    Patient ID: Bruce Olson, male    DOB: 1955-03-08, 57 y.o.   MRN: [id number]  HPI The patient is a 57 year old male, who presents with LBP . The symptoms started 12 years ago, after he tried to move a coke machine at work. The patient complains about moderate to severe pain   , which radiates to both sides paravertabral, and into his anterior right thigh.  Patient also complains about numbness and tingling in the right anterior thigh too . He describes the pain as sharp, stabbing and aching . Applying heat, TENS, taking medications , changing positions alleviate the symptoms. Prolonged standing and walking aggrevates the symptoms. The patient grades his pain as a 9 /10. Hx of PSF L4-S1 2012, PSF L2-4 2013, by Dr. Yetta Barre. Saw his surgeon last month, CT-scan has revealed that the last PSF has not healed completely. Dr. Yetta Barre suggested another surgery, but WC did not approve. The patient reports that he had trouble to fill his Opana last month, WC did not want to pay for it. We called his pharmacy, they told us that he can get his Opana without having to pay for it at this point. Patient is wearing an Aspen lumbar brace most of the day, he states, that he is trying to not wear it all the time. Pain Inventory Average Pain 9 Pain Right Now 9 My pain is sharp, stabbing and tingling  In the last 24 hours, has pain interfered with the following? General activity 9 Relation with others 9 Enjoyment of life 9 What TIME of day is your pain at its worst? all the time Sleep (in general) Poor  Pain is worse with: walking, sitting and standing Pain improves with: medication and TENS Relief from Meds: 9  Mobility walk without assistance use a cane how many minutes can you walk? 1/2 mile ability to climb steps?  yes do you drive?  yes  Function disabled: date disabled 63  Neuro/Psych weakness numbness tremor trouble walking  Prior Studies x-rays  Physicians involved in  your care Marikay Alar   Family History  Problem Relation Age of Onset  . Anesthesia problems Neg Hx   . Hypotension Neg Hx   . Malignant hyperthermia Neg Hx   . Pseudochol deficiency Neg Hx    History   Social History  . Marital Status: Single    Spouse Name: N/A    Number of Children: N/A  . Years of Education: N/A   Social History Main Topics  . Smoking status: Current Every Day Smoker -- 2.00 packs/day for 33 years    Types: Cigarettes  . Smokeless tobacco: Never Used  . Alcohol Use: Yes     Comment: beer 3x week  . Drug Use: No  . Sexually Active: No     Comment: back problems   Other Topics Concern  . None   Social History Narrative  . None   Past Surgical History  Procedure Laterality Date  . Back surgery      Back surgery 09/15/2010   Past Medical History  Diagnosis Date  . No pertinent past medical history    BP 126/72  Pulse 74  Resp 14  Ht 5\' 6"  (1.676 m)  Wt 121 lb (54.885 kg)  BMI 19.54 kg/m2  SpO2 98%     Review of Systems  Musculoskeletal: Positive for back pain and gait problem.  Neurological: Positive for tremors, weakness and numbness.  All other systems reviewed and  are negative.       Objective:   Physical Exam  Constitutional: He is oriented to person, place, and time. He appears well-developed and well-nourished.  HENT:  Head: Normocephalic.  Neck: Neck supple.  Musculoskeletal: He exhibits tenderness.  Neurological: He is alert and oriented to person, place, and time.  Skin: Skin is warm and dry.  Psychiatric: He has a normal mood and affect.    Symmetric normal motor tone is noted throughout. Normal muscle bulk. Muscle testing reveals 5/5 muscle strength of the upper extremity, and 5/5 of the lower extremity. Full range of motion in upper and lower extremities. ROM of spine is restricted. Fine motor movements are normal in both hands. Sensory is intact and symmetric to light touch, pinprick and proprioception, except  right anterior thigh decreased sensation to light touch. DTR in the upper and lower extremity are present and symmetric 3+. No clonus is noted.  Patient arises from chair with mild difficulty. Narrow based gait with normal arm swing bilateral , able to walk on heels and toes . Tandem walk is stable. No pronator drift. Rhomberg negative. Patient is wearing an Aspen lumbar brace.  SLR negative, bilateral.      Assessment & Plan:  1. Lumbar postlaminectomy syndrome, Hx of PSF L4-S1 2012, and PSF L2-4 2013, by Dr. Yetta Barre. No evidence of sacroiliac disorder, patient reports that his LBP has gotten worse after a SI injection on the right.   Discussed with patient narcotic analgesics can only reduce pain from 10-30%. He is back on the Opana ER 20mg  bid ( we called his pharmacy, he should be able to fill this medication today) and Oxycodone 7.5mg  tid. He is also on the Lyrica 100mg  tid, which helps with his radiating symptoms.     He states that he will see a pain psychologist to help with coping with pain .  Patient is wearing an Aspen lumbar brace most of the day, I advised him to try to not wear it all the time to strengthen his core muscles, while he is walking, for example. Recommended walking or exercising in the pool. The patient states, that he does not have a NCM anymore, and that he can not talk to [facility], everything goes through his lawyer. Spinal cord stimulation as a potential treatment option if neurosurgery is not planning on doing any further surgery in the near future. Follow up in one month.

## 2012-05-30 ENCOUNTER — Encounter (HOSPITAL_BASED_OUTPATIENT_CLINIC_OR_DEPARTMENT_OTHER): Payer: Worker's Compensation | Admitting: Physical Medicine and Rehabilitation

## 2012-05-30 ENCOUNTER — Encounter: Payer: Self-pay | Admitting: Physical Medicine and Rehabilitation

## 2012-05-30 VITALS — BP 125/69 | HR 68 | Resp 17 | Ht 66.0 in | Wt 123.0 lb

## 2012-05-30 DIAGNOSIS — M961 Postlaminectomy syndrome, not elsewhere classified: Secondary | ICD-10-CM

## 2012-05-30 MED ORDER — OXYMORPHONE HCL ER 20 MG PO TB12: 20.0000 mg | ORAL_TABLET | Freq: Two times a day (BID) | ORAL | Status: DC

## 2012-05-30 MED ORDER — OXYCODONE-ACETAMINOPHEN 7.5-325 MG PO TABS: 1.0000 | ORAL_TABLET | ORAL | Status: DC | PRN

## 2012-05-30 NOTE — Patient Instructions (Addendum)
Try to stay as active as tolerate, continue with your walking. Continue with the stretches you learned from PT as tolerated.

## 2012-05-30 NOTE — Progress Notes (Signed)
Subjective:    Patient ID: Bruce Olson, male    DOB: 08-28-1955, 57 y.o.   MRN: [id number]  HPI The patient is a 57 year old male, who presents with LBP . The symptoms started 12 years ago, after he tried to move a coke machine at work. The patient complains about moderate to severe pain , which radiates to both sides paravertabral, and into his anterior right thigh. Patient also complains about numbness and tingling in the right anterior thigh too . He describes the pain as sharp, stabbing and aching . Applying heat, TENS, taking medications , changing positions alleviate the symptoms. Prolonged standing and walking aggrevates the symptoms. The patient grades his pain as a 9 /10.  Hx of PSF L4-S1 2012, PSF L2-4 2013, by Dr. Yetta Barre. Saw his surgeon last month, CT-scan has revealed that the last PSF has not healed completely. Dr. Yetta Barre suggested another surgery, but WC did not approve. The patient reports that he had trouble to fill his Opana last month, WC did not want to pay for it. We called his pharmacy, they told us that he can get his Opana without having to pay for it at this point.  Patient is wearing an Aspen lumbar brace most of the day, he states, that he is trying to not wear it all the time.  Pain Inventory Average Pain 8 Pain Right Now 8 My pain is sharp, stabbing and tingling  In the last 24 hours, has pain interfered with the following? General activity 8 Relation with others 8 Enjoyment of life 8 What TIME of day is your pain at its worst? constant  Sleep (in general) Poor  Pain is worse with: walking, sitting and standing Pain improves with: medication and TENS Relief from Meds: 8  Mobility walk without assistance use a walker how many minutes can you walk? 1/2 mile ability to climb steps?  yes do you drive?  yes  Function disabled: date disabled 4-12  Neuro/Psych weakness numbness tremor trouble walking  Prior Studies Any changes since last visit?   no  Physicians involved in your care Any changes since last visit?  no   Family History  Problem Relation Age of Onset  . Anesthesia problems Neg Hx   . Hypotension Neg Hx   . Malignant hyperthermia Neg Hx   . Pseudochol deficiency Neg Hx    History   Social History  . Marital Status: Single    Spouse Name: N/A    Number of Children: N/A  . Years of Education: N/A   Social History Main Topics  . Smoking status: Current Every Day Smoker -- 2.00 packs/day for 33 years    Types: Cigarettes  . Smokeless tobacco: Never Used  . Alcohol Use: Yes     Comment: beer 3x week  . Drug Use: No  . Sexually Active: No     Comment: back problems   Other Topics Concern  . None   Social History Narrative  . None   Past Surgical History  Procedure Laterality Date  . Back surgery      Back surgery 09/15/2010   Past Medical History  Diagnosis Date  . No pertinent past medical history    BP 125/69  Pulse 68  Resp 17  Ht 5\' 6"  (1.676 m)  Wt 123 lb (55.792 kg)  BMI 19.86 kg/m2  SpO2 93%     Review of Systems  Musculoskeletal: Positive for gait problem.  Neurological: Positive for tremors, weakness and  numbness.  All other systems reviewed and are negative.       Objective:   Physical Exam Constitutional: He is oriented to person, place, and time. He appears well-developed and well-nourished.  HENT:  Head: Normocephalic.  Neck: Neck supple.  Musculoskeletal: He exhibits tenderness.  Neurological: He is alert and oriented to person, place, and time.  Skin: Skin is warm and dry.  Psychiatric: He has a normal mood and affect.  Symmetric normal motor tone is noted throughout. Normal muscle bulk. Muscle testing reveals 5/5 muscle strength of the upper extremity, and 5/5 of the lower extremity. Full range of motion in upper and lower extremities. ROM of spine is restricted. Fine motor movements are normal in both hands.  Sensory is intact and symmetric to light touch,  pinprick and proprioception, except right anterior thigh decreased sensation to light touch.  DTR in the upper and lower extremity are present and symmetric 3+. No clonus is noted.  Patient arises from chair with mild difficulty. Narrow based gait with normal arm swing bilateral , able to walk on heels and toes . Tandem walk is stable. No pronator drift. Rhomberg negative.  Patient is wearing an Aspen lumbar brace.  SLR negative, bilateral.        Assessment & Plan:  1. Lumbar postlaminectomy syndrome, Hx of PSF L4-S1 2012, and PSF L2-4 2013, by Dr. Yetta Barre.  No evidence of sacroiliac disorder, patient reports that his LBP has gotten worse after a SI injection on the right.  Discussed with patient narcotic analgesics can only reduce pain from 10-30%. He is back on the Opana ER 20mg  bid ( we called his pharmacy, he should be able to fill this medication today) and Oxycodone 7.5mg  tid. He is also on the Lyrica 100mg  tid, which helps with his radiating symptoms.  He states that he will see a pain psychologist to help with coping with pain .  Patient is wearing an Aspen lumbar brace most of the day, I advised him to try to not wear it all the time to strengthen his core muscles, while he is walking, for example. Recommended walking or exercising in the pool. The patient states, that he does not have a NCM anymore, and that he can not talk to [facility], everything goes through his lawyer.  Spinal cord stimulation as a potential treatment option if neurosurgery is not planning on doing any further surgery in the near future.  Follow up in one month.

## 2012-06-29 ENCOUNTER — Encounter
Payer: Worker's Compensation | Attending: Physical Medicine and Rehabilitation | Admitting: Physical Medicine and Rehabilitation

## 2012-06-29 ENCOUNTER — Encounter: Payer: Self-pay | Admitting: Physical Medicine and Rehabilitation

## 2012-06-29 VITALS — BP 130/62 | HR 68 | Resp 15 | Ht 66.0 in | Wt 123.0 lb

## 2012-06-29 DIAGNOSIS — M961 Postlaminectomy syndrome, not elsewhere classified: Secondary | ICD-10-CM

## 2012-06-29 DIAGNOSIS — F17200 Nicotine dependence, unspecified, uncomplicated: Secondary | ICD-10-CM | POA: Insufficient documentation

## 2012-06-29 MED ORDER — OXYMORPHONE HCL ER 20 MG PO TB12: 20.0000 mg | ORAL_TABLET | Freq: Two times a day (BID) | ORAL | Status: DC

## 2012-06-29 MED ORDER — OXYCODONE-ACETAMINOPHEN 7.5-325 MG PO TABS: 1.0000 | ORAL_TABLET | ORAL | Status: DC | PRN

## 2012-06-29 NOTE — Patient Instructions (Signed)
Continue with your walking program, as pain permits.

## 2012-06-29 NOTE — Addendum Note (Signed)
Addended by: Doreene Eland on: 06/29/2012 02:16 PM   Modules accepted: Orders

## 2012-06-29 NOTE — Progress Notes (Signed)
Subjective:    Patient ID: Bruce Olson, male    DOB: [birth year], 57 y.o.   MRN: [id number]  HPI The patient is a 57 year old male, who presents with LBP . The symptoms started 12 years ago, after he tried to move a coke machine at work. The patient complains about moderate to severe pain , which radiates to both sides paravertabral, and into his anterior right thigh. Patient also complains about numbness and tingling in the right anterior thigh too . He describes the pain as sharp, stabbing and aching . Applying heat, TENS, taking medications , changing positions alleviate the symptoms. Prolonged standing and walking aggrevates the symptoms. The patient grades his pain as a 9 /10.  Hx of PSF L4-S1 2012, PSF L2-4 2013, by Dr. Yetta Barre. Saw his surgeon last month, CT-scan has revealed that the last PSF has not healed completely. Dr. Yetta Barre suggested another surgery, but WC did not approve. The patient reports that he had trouble to fill his Opana last month, WC did not want to pay for it. We called his pharmacy, they told us that he can get his Opana without having to pay for it at this point.  Patient is wearing an Aspen lumbar brace most of the day, he states, that he is trying to not wear it all the time.  Pain Inventory Average Pain 9 Pain Right Now 9 My pain is sharp, stabbing and tingling  In the last 24 hours, has pain interfered with the following? General activity 9 Relation with others 9 Enjoyment of life 9 What TIME of day is your pain at its worst? constant Sleep (in general) Poor  Pain is worse with: walking, sitting and standing Pain improves with: medication and TENS Relief from Meds: 9  Mobility walk without assistance use a walker how many minutes can you walk? 1/4 mile ability to climb steps?  yes do you drive?  yes  Function disabled: date disabled 05-2010  Neuro/Psych weakness numbness tremor trouble walking  Prior Studies Any changes since last visit?   yes x-rays CT/MRI  Physicians involved in your care Any changes since last visit?  no   Family History  Problem Relation Age of Onset  . Anesthesia problems Neg Hx   . Hypotension Neg Hx   . Malignant hyperthermia Neg Hx   . Pseudochol deficiency Neg Hx    History   Social History  . Marital Status: Single    Spouse Name: N/A    Number of Children: N/A  . Years of Education: N/A   Social History Main Topics  . Smoking status: Current Every Day Smoker -- 2.00 packs/day for 33 years    Types: Cigarettes  . Smokeless tobacco: Never Used  . Alcohol Use: Yes     Comment: beer 3x week  . Drug Use: No  . Sexually Active: No     Comment: back problems   Other Topics Concern  . None   Social History Narrative  . None   Past Surgical History  Procedure Laterality Date  . Back surgery      Back surgery 09/15/2010   Past Medical History  Diagnosis Date  . No pertinent past medical history    BP 130/62  Pulse 68  Resp 15  Ht 5\' 6"  (1.676 m)  Wt 123 lb (55.792 kg)  BMI 19.86 kg/m2  SpO2 95%      Review of Systems  Musculoskeletal: Positive for gait problem.  Neurological: Positive for tremors,  weakness and numbness.  All other systems reviewed and are negative.       Objective:   Physical Exam Constitutional: He is oriented to person, place, and time. He appears well-developed and well-nourished.  HENT:  Head: Normocephalic.  Neck: Neck supple.  Musculoskeletal: He exhibits tenderness.  Neurological: He is alert and oriented to person, place, and time.  Skin: Skin is warm and dry.  Psychiatric: He has a normal mood and affect.  Symmetric normal motor tone is noted throughout. Normal muscle bulk. Muscle testing reveals 5/5 muscle strength of the upper extremity, and 5/5 of the lower extremity. Full range of motion in upper and lower extremities. ROM of spine is restricted. Fine motor movements are normal in both hands.  Sensory is intact and symmetric to  light touch, pinprick and proprioception, except right anterior thigh decreased sensation to light touch.  DTR in the upper and lower extremity are present and symmetric 3+. No clonus is noted.  Patient arises from chair with mild difficulty. Narrow based gait with normal arm swing bilateral , able to walk on heels and toes . Tandem walk is stable. No pronator drift. Rhomberg negative.  Patient is wearing an Aspen lumbar brace.  SLR negative, bilateral.        Assessment & Plan:  1. Lumbar postlaminectomy syndrome, Hx of PSF L4-S1 2012, and PSF L2-4 2013, by Dr. Yetta Barre.  His most recent CT-scan showed, that the pedicle screws at L2 are demonstrating mild surrounding Lucency, with the possibility of mild loosening of the pedicle screws. No evidence of sacroiliac disorder, patient reports that his LBP has gotten worse after a SI injection on the right.  Discussed with patient narcotic analgesics can only reduce pain from 10-30%. He is back on the Opana ER 20mg  bid ( we called his pharmacy, he should be able to fill this medication today) and Oxycodone 7.5mg  tid. He is also on the Lyrica 100mg  tid, which helps with his radiating symptoms.  He states that he will see a pain psychologist to help with coping with pain .  Patient is wearing an Aspen lumbar brace most of the day, I advised him to try to not wear it all the time to strengthen his core muscles, while he is walking, for example. Recommended walking or exercising in the pool. The patient states, that he does not have a NCM anymore, and that he can not talk to [facility], everything goes through his lawyer.    Follow up in one month.

## 2012-07-26 ENCOUNTER — Encounter: Payer: Self-pay | Admitting: Physical Medicine and Rehabilitation

## 2012-07-26 ENCOUNTER — Encounter
Payer: Worker's Compensation | Attending: Physical Medicine and Rehabilitation | Admitting: Physical Medicine and Rehabilitation

## 2012-07-26 VITALS — BP 131/62 | HR 70 | Temp 95.0°F | Resp 16 | Ht 66.0 in | Wt 125.0 lb

## 2012-07-26 DIAGNOSIS — M545 Low back pain, unspecified: Secondary | ICD-10-CM | POA: Insufficient documentation

## 2012-07-26 DIAGNOSIS — F17200 Nicotine dependence, unspecified, uncomplicated: Secondary | ICD-10-CM | POA: Insufficient documentation

## 2012-07-26 DIAGNOSIS — R209 Unspecified disturbances of skin sensation: Secondary | ICD-10-CM | POA: Insufficient documentation

## 2012-07-26 DIAGNOSIS — M961 Postlaminectomy syndrome, not elsewhere classified: Secondary | ICD-10-CM | POA: Insufficient documentation

## 2012-07-26 MED ORDER — PREGABALIN 100 MG PO CAPS: 100.0000 mg | ORAL_CAPSULE | Freq: Three times a day (TID) | ORAL | Status: DC

## 2012-07-26 MED ORDER — OXYMORPHONE HCL ER 20 MG PO TB12: 20.0000 mg | ORAL_TABLET | Freq: Two times a day (BID) | ORAL | Status: DC

## 2012-07-26 MED ORDER — METHOCARBAMOL 500 MG PO TABS: 500.0000 mg | ORAL_TABLET | Freq: Three times a day (TID) | ORAL | Status: DC

## 2012-07-26 MED ORDER — OXYCODONE-ACETAMINOPHEN 7.5-325 MG PO TABS: 1.0000 | ORAL_TABLET | ORAL | Status: DC | PRN

## 2012-07-26 NOTE — Progress Notes (Signed)
Subjective:    Patient ID: Bruce Olson, male    DOB: [birth year], 57 y.o.   MRN: [id number]  HPI The patient is a 57 year old male, who presents with LBP . The symptoms started 12 years ago, after he tried to move a coke machine at work. The patient complains about moderate to severe pain , which radiates to both sides paravertabral, and into his anterior right thigh. Patient also complains about numbness and tingling in the right anterior thigh too . He describes the pain as sharp, stabbing and aching . Applying heat, TENS, taking medications , changing positions alleviate the symptoms. Prolonged standing and walking aggrevates the symptoms. The patient grades his pain as a 9 /10.  Hx of PSF L4-S1 2012, PSF L2-4 2013, by Dr. Yetta Barre. Saw his surgeon last month, CT-scan has revealed that the last PSF has not healed completely. Dr. Yetta Barre suggested another surgery, but WC did not approve. The patient reports that he had trouble to fill his Opana last month, WC did not want to pay for it. We called his pharmacy, they told us that he can get his Opana without having to pay for it at this point.  Patient is wearing an Aspen lumbar brace most of the day, he states, that he is trying to not wear it all the time.  Pain Inventory Average Pain 9 Pain Right Now 9 My pain is sharp, stabbing and tingling  In the last 24 hours, has pain interfered with the following? General activity 9 Relation with others 9 Enjoyment of life 9 What TIME of day is your pain at its worst? constant Sleep (in general) Poor  Pain is worse with: walking, sitting and standing Pain improves with: medication and TENS Relief from Meds: 9  Mobility walk without assistance use a walker how many minutes can you walk? 1/4 mile ability to climb steps?  yes do you drive?  yes  Function disabled: date disabled 05-2011  Neuro/Psych weakness numbness tremor trouble walking  Prior Studies Any changes since last visit?   no  Physicians involved in your care Any changes since last visit?  no   Family History  Problem Relation Age of Onset  . Anesthesia problems Neg Hx   . Hypotension Neg Hx   . Malignant hyperthermia Neg Hx   . Pseudochol deficiency Neg Hx    History   Social History  . Marital Status: Single    Spouse Name: N/A    Number of Children: N/A  . Years of Education: N/A   Social History Main Topics  . Smoking status: Current Every Day Smoker -- 2.00 packs/day for 33 years    Types: Cigarettes  . Smokeless tobacco: Never Used  . Alcohol Use: Yes     Comment: beer 3x week  . Drug Use: No  . Sexually Active: No     Comment: back problems   Other Topics Concern  . None   Social History Narrative  . None   Past Surgical History  Procedure Laterality Date  . Back surgery      Back surgery 09/15/2010   Past Medical History  Diagnosis Date  . No pertinent past medical history    BP 131/62  Pulse 70  Temp(Src) 95 F (35 C)  Resp 16  Ht 5\' 6"  (1.676 m)  Wt 125 lb (56.7 kg)  BMI 20.19 kg/m2  SpO2 95%     Review of Systems  Musculoskeletal: Positive for gait problem.  Neurological:  Positive for tremors, weakness and numbness.  All other systems reviewed and are negative.       Objective:   Physical Exam Constitutional: He is oriented to person, place, and time. He appears well-developed and well-nourished.  HENT:  Head: Normocephalic.  Neck: Neck supple.  Musculoskeletal: He exhibits tenderness.  Neurological: He is alert and oriented to person, place, and time.  Skin: Skin is warm and dry.  Psychiatric: He has a normal mood and affect.  Symmetric normal motor tone is noted throughout. Normal muscle bulk. Muscle testing reveals 5/5 muscle strength of the upper extremity, and 5/5 of the lower extremity. Full range of motion in upper and lower extremities. ROM of spine is restricted. Fine motor movements are normal in both hands.  Sensory is intact and  symmetric to light touch, pinprick and proprioception, except right anterior thigh decreased sensation to light touch.  DTR in the upper and lower extremity are present and symmetric 3+. No clonus is noted.  Patient arises from chair with mild difficulty. Narrow based gait with normal arm swing bilateral , able to walk on heels and toes . Tandem walk is stable. No pronator drift. Rhomberg negative.  Patient is wearing an Aspen lumbar brace.  SLR negative, bilateral.        Assessment & Plan:  1. Lumbar postlaminectomy syndrome, Hx of PSF L4-S1 2012, and PSF L2-4 2013, by Dr. Yetta Barre.  His most recent CT-scan showed, that the pedicle screws at L2 are demonstrating mild surrounding Lucency, with the possibility of mild loosening of the pedicle screws.  No evidence of sacroiliac disorder, patient reports that his LBP has gotten worse after a SI injection on the right.  Discussed with patient narcotic analgesics can only reduce pain from 10-30%. He is back on the Opana ER 20mg  bid ( we called his pharmacy, he should be able to fill this medication today) and Oxycodone 7.5mg  tid. He is also on the Lyrica 100mg  tid, which helps with his radiating symptoms.  He states that he will see a pain psychologist to help with coping with pain .  Patient is wearing an Aspen lumbar brace most of the day, I advised him to try to not wear it all the time to strengthen his core muscles, while he is walking, for example. Recommended walking or exercising in the pool. The patient states, that he does not have a NCM anymore, and that he can not talk to [facility], everything goes through his lawyer.  Follow up in one month.

## 2012-07-26 NOTE — Patient Instructions (Signed)
Try to stay as active as tolerated 

## 2012-08-17 ENCOUNTER — Encounter
Payer: Worker's Compensation | Attending: Physical Medicine and Rehabilitation | Admitting: Physical Medicine and Rehabilitation

## 2012-08-17 ENCOUNTER — Encounter: Payer: Self-pay | Admitting: Physical Medicine and Rehabilitation

## 2012-08-17 VITALS — BP 134/70 | HR 70 | Resp 14 | Ht 66.0 in | Wt 123.0 lb

## 2012-08-17 DIAGNOSIS — Z79899 Other long term (current) drug therapy: Secondary | ICD-10-CM | POA: Insufficient documentation

## 2012-08-17 DIAGNOSIS — M961 Postlaminectomy syndrome, not elsewhere classified: Secondary | ICD-10-CM | POA: Insufficient documentation

## 2012-08-17 DIAGNOSIS — Z981 Arthrodesis status: Secondary | ICD-10-CM | POA: Insufficient documentation

## 2012-08-17 MED ORDER — OXYCODONE-ACETAMINOPHEN 7.5-325 MG PO TABS: 1.0000 | ORAL_TABLET | ORAL | Status: DC | PRN

## 2012-08-17 MED ORDER — OXYMORPHONE HCL ER 20 MG PO TB12: 20.0000 mg | ORAL_TABLET | Freq: Two times a day (BID) | ORAL | Status: DC

## 2012-08-17 NOTE — Progress Notes (Signed)
Subjective:    Patient ID: Bruce Olson, male    DOB: 10-Feb-1955, 57 y.o.   MRN: [id number]  HPI The patient is a 57 year old male, who presents with LBP . The symptoms started 12 years ago, after he tried to move a coke machine at work. The patient complains about moderate to severe pain , which radiates to both sides paravertabral, and into his anterior right thigh. Patient also complains about numbness and tingling in the right anterior thigh too . He describes the pain as sharp, stabbing and aching . Applying heat, TENS, taking medications , changing positions alleviate the symptoms. Prolonged standing and walking aggrevates the symptoms. The patient grades his pain as a 9 /10.  Hx of PSF L4-S1 2012, PSF L2-4 2013, by Dr. Yetta Barre. Saw his surgeon last month, CT-scan has revealed that the last PSF has not healed completely. Dr. Yetta Barre suggested another surgery, but WC did not approve. The patient reports that he had trouble to fill his Opana last month, WC did not want to pay for it. We called his pharmacy, they told us that he can get his Opana without having to pay for it at this point.  Patient is wearing an Aspen lumbar brace most of the day, he states, that he is trying to not wear it all the time.  Pain Inventory Average Pain 9 Pain Right Now 9 My pain is sharp, stabbing and tingling  In the last 24 hours, has pain interfered with the following? General activity 8 Relation with others 8 Enjoyment of life 8 What TIME of day is your pain at its worst? constant Sleep (in general) Poor  Pain is worse with: walking, sitting and standing Pain improves with: medication and TENS Relief from Meds: 8  Mobility walk without assistance use a walker ability to climb steps?  yes do you drive?  yes  Function disabled: date disabled 04/12  Neuro/Psych weakness numbness tremor trouble walking  Prior Studies Any changes since last visit?  no  Physicians involved in your care Any  changes since last visit?  no   Family History  Problem Relation Age of Onset  . Anesthesia problems Neg Hx   . Hypotension Neg Hx   . Malignant hyperthermia Neg Hx   . Pseudochol deficiency Neg Hx    History   Social History  . Marital Status: Single    Spouse Name: N/A    Number of Children: N/A  . Years of Education: N/A   Social History Main Topics  . Smoking status: Current Every Day Smoker -- 2.00 packs/day for 33 years    Types: Cigarettes  . Smokeless tobacco: Never Used  . Alcohol Use: Yes     Comment: beer 3x week  . Drug Use: No  . Sexually Active: No     Comment: back problems   Other Topics Concern  . None   Social History Narrative  . None   Past Surgical History  Procedure Laterality Date  . Back surgery      Back surgery 09/15/2010   Past Medical History  Diagnosis Date  . No pertinent past medical history    BP 134/70  Pulse 70  Resp 14  Ht 5\' 6"  (1.676 m)  Wt 123 lb (55.792 kg)  BMI 19.86 kg/m2  SpO2 96%     Review of Systems  Musculoskeletal: Positive for back pain and gait problem.  Neurological: Positive for tremors, weakness and numbness.  All other systems reviewed  and are negative.       Objective:   Physical Exam Constitutional: He is oriented to person, place, and time. He appears well-developed and well-nourished.  HENT:  Head: Normocephalic.  Neck: Neck supple.  Musculoskeletal: He exhibits tenderness.  Neurological: He is alert and oriented to person, place, and time.  Skin: Skin is warm and dry.  Psychiatric: He has a normal mood and affect.  Symmetric normal motor tone is noted throughout. Normal muscle bulk. Muscle testing reveals 5/5 muscle strength of the upper extremity, and 5/5 of the lower extremity. Full range of motion in upper and lower extremities. ROM of spine is restricted. Fine motor movements are normal in both hands.  Sensory is intact and symmetric to light touch, pinprick and proprioception, except  right anterior thigh decreased sensation to light touch.  DTR in the upper and lower extremity are present and symmetric 3+. No clonus is noted.  Patient arises from chair with mild difficulty. Narrow based gait with normal arm swing bilateral , able to walk on heels and toes . Tandem walk is stable. No pronator drift. Rhomberg negative.  Patient is wearing an Aspen lumbar brace.  SLR negative, bilateral.        Assessment & Plan:  1. Lumbar postlaminectomy syndrome, Hx of PSF L4-S1 2012, and PSF L2-4 2013, by Dr. Yetta Barre.  His most recent CT-scan showed, that the pedicle screws at L2 are demonstrating mild surrounding Lucency, with the possibility of mild loosening of the pedicle screws.  No evidence of sacroiliac disorder, patient reports that his LBP has gotten worse after a SI injection on the right.  Discussed with patient narcotic analgesics can only reduce pain from 10-30%. He is back on the Opana ER 20mg  bid ( we called his pharmacy, he should be able to fill this medication today) and Oxycodone 7.5mg  tid. He is also on the Lyrica 100mg  tid, which helps with his radiating symptoms.  He states that he will see a pain psychologist to help with coping with pain .  Patient is wearing an Aspen lumbar brace most of the day, I advised him to try to not wear it all the time to strengthen his core muscles, while he is walking, for example. Recommended walking or exercising in the pool. The patient states, that he does not have a NCM anymore, and that he can not talk to [facility], everything goes through his lawyer. He has almost settled with WC, if this is final, he will follow up with Dr. Yetta Barre again for possible surgery. Follow up in one month.

## 2012-08-17 NOTE — Patient Instructions (Signed)
Try to continue with your walking program as pain permits.

## 2012-09-25 ENCOUNTER — Encounter: Payer: Self-pay | Admitting: Physical Medicine and Rehabilitation

## 2012-09-25 ENCOUNTER — Encounter
Payer: Worker's Compensation | Attending: Physical Medicine and Rehabilitation | Admitting: Physical Medicine and Rehabilitation

## 2012-09-25 VITALS — BP 147/71 | HR 62 | Resp 14 | Ht 66.0 in | Wt 125.2 lb

## 2012-09-25 DIAGNOSIS — Z981 Arthrodesis status: Secondary | ICD-10-CM | POA: Insufficient documentation

## 2012-09-25 DIAGNOSIS — Z79899 Other long term (current) drug therapy: Secondary | ICD-10-CM | POA: Insufficient documentation

## 2012-09-25 DIAGNOSIS — M961 Postlaminectomy syndrome, not elsewhere classified: Secondary | ICD-10-CM | POA: Insufficient documentation

## 2012-09-25 DIAGNOSIS — R269 Unspecified abnormalities of gait and mobility: Secondary | ICD-10-CM | POA: Insufficient documentation

## 2012-09-25 DIAGNOSIS — M545 Low back pain: Secondary | ICD-10-CM

## 2012-09-25 DIAGNOSIS — G8929 Other chronic pain: Secondary | ICD-10-CM

## 2012-09-25 MED ORDER — OXYCODONE-ACETAMINOPHEN 7.5-325 MG PO TABS: 1.0000 | ORAL_TABLET | ORAL | Status: DC | PRN

## 2012-09-25 MED ORDER — OXYMORPHONE HCL ER 20 MG PO TB12: 20.0000 mg | ORAL_TABLET | Freq: Two times a day (BID) | ORAL | Status: DC

## 2012-09-25 NOTE — Progress Notes (Signed)
Subjective:    Patient ID: Bruce Olson, male    DOB: 12/18/1955, 57 y.o.   MRN: [id number]  HPI The patient is a 57 year old male, who presents with LBP . The symptoms started 12 years ago, after he tried to move a coke machine at work. The patient complains about moderate to severe pain , which radiates to both sides paravertabral, and into his anterior right thigh. Patient also complains about numbness and tingling in the right anterior thigh too . He describes the pain as sharp, stabbing and aching . Applying heat, TENS, taking medications , changing positions alleviate the symptoms. Prolonged standing and walking aggrevates the symptoms. The patient grades his pain as a 9 /10.  Hx of PSF L4-S1 2012, PSF L2-4 2013, by Dr. Yetta Barre. Saw his surgeon last month, CT-scan has revealed that the last PSF has not healed completely. Dr. Yetta Barre suggested another surgery, but WC did not approve.   Patient is wearing an Aspen lumbar brace most of the day, he states, that he is trying to not wear it all the time.  Pain Inventory Average Pain 6 Pain Right Now 6 My pain is sharp and stabbing  In the last 24 hours, has pain interfered with the following? General activity 8 Relation with others 8 Enjoyment of life 8 What TIME of day is your pain at its worst? all Sleep (in general) Poor  Pain is worse with: walking, sitting and standing Pain improves with: medication and TENS Relief from Meds: 8  Mobility walk without assistance use a cane how many minutes can you walk? 1/4 mile ability to climb steps?  yes do you drive?  yes  Function disabled: date disabled 03/2010  Neuro/Psych weakness numbness tremor trouble walking  Prior Studies Any changes since last visit?  no  Physicians involved in your care Any changes since last visit?  no   Family History  Problem Relation Age of Onset  . Anesthesia problems Neg Hx   . Hypotension Neg Hx   . Malignant hyperthermia Neg Hx   .  Pseudochol deficiency Neg Hx    History   Social History  . Marital Status: Single    Spouse Name: N/A    Number of Children: N/A  . Years of Education: N/A   Social History Main Topics  . Smoking status: Current Every Day Smoker -- 2.00 packs/day for 33 years    Types: Cigarettes  . Smokeless tobacco: Never Used  . Alcohol Use: Yes     Comment: beer 3x week  . Drug Use: No  . Sexual Activity: No     Comment: back problems   Other Topics Concern  . None   Social History Narrative  . None   Past Surgical History  Procedure Laterality Date  . Back surgery      Back surgery 09/15/2010   Past Medical History  Diagnosis Date  . No pertinent past medical history    BP 147/71  Pulse 62  Resp 14  Ht 5\' 6"  (1.676 m)  Wt 125 lb 3.2 oz (56.79 kg)  BMI 20.22 kg/m2  SpO2 98%     Review of Systems  Musculoskeletal: Positive for gait problem.  Neurological: Positive for tremors, weakness and numbness.       Objective:   Physical Exam Constitutional: He is oriented to person, place, and time. He appears well-developed and well-nourished.  HENT:  Head: Normocephalic.  Neck: Neck supple.  Musculoskeletal: He exhibits tenderness.  Neurological:  He is alert and oriented to person, place, and time.  Skin: Skin is warm and dry.  Psychiatric: He has a normal mood and affect.  Symmetric normal motor tone is noted throughout. Normal muscle bulk. Muscle testing reveals 5/5 muscle strength of the upper extremity, and 5/5 of the lower extremity. Full range of motion in upper and lower extremities. ROM of spine is restricted. Fine motor movements are normal in both hands.  Sensory is intact and symmetric to light touch, pinprick and proprioception, except right anterior thigh decreased sensation to light touch.  DTR in the upper and lower extremity are present and symmetric 3+. No clonus is noted.  Patient arises from chair with mild difficulty. Narrow based gait with normal arm  swing bilateral , able to walk on heels and toes . Tandem walk is stable. No pronator drift. Rhomberg negative.  Patient is wearing an Aspen lumbar brace.  SLR negative, bilateral.        Assessment & Plan:  1. Lumbar postlaminectomy syndrome, Hx of PSF L4-S1 2012, and PSF L2-4 2013, by Dr. Yetta Barre.  His most recent CT-scan showed, that the pedicle screws at L2 are demonstrating mild surrounding Lucency, with the possibility of mild loosening of the pedicle screws.  No evidence of sacroiliac disorder, patient reports that his LBP has gotten worse after a SI injection on the right.  Discussed with patient narcotic analgesics can only reduce pain from 10-30%. He is back on the Opana ER 20mg  bid  and Oxycodone 7.5mg  tid. He is also on the Lyrica 100mg  tid, which helps with his radiating symptoms, and on Robaxin 500mg  tid, for his muscle spasms/pain.   Patient is wearing an Aspen lumbar brace most of the day, I advised him to try to not wear it all the time to strengthen his core muscles, while he is walking, for example. Recommended walking or exercising in the pool. The patient states, that he does not have a NCM anymore, and that he can not talk to [facility], everything goes through his lawyer. He has almost settled with WC, if this is final, he will follow up with Dr. Yetta Barre again for possible surgery.  Follow up in one month.

## 2012-09-25 NOTE — Patient Instructions (Signed)
Continue with your walking program as pain permits

## 2012-09-28 ENCOUNTER — Other Ambulatory Visit: Payer: Self-pay | Admitting: Physical Medicine and Rehabilitation

## 2012-10-23 ENCOUNTER — Encounter
Payer: Worker's Compensation | Attending: Physical Medicine and Rehabilitation | Admitting: Physical Medicine and Rehabilitation

## 2012-10-23 ENCOUNTER — Encounter: Payer: Self-pay | Admitting: Physical Medicine and Rehabilitation

## 2012-10-23 VITALS — BP 119/79 | HR 66 | Resp 14 | Ht 66.0 in | Wt 124.0 lb

## 2012-10-23 DIAGNOSIS — M961 Postlaminectomy syndrome, not elsewhere classified: Secondary | ICD-10-CM | POA: Insufficient documentation

## 2012-10-23 DIAGNOSIS — Z981 Arthrodesis status: Secondary | ICD-10-CM | POA: Insufficient documentation

## 2012-10-23 DIAGNOSIS — F17200 Nicotine dependence, unspecified, uncomplicated: Secondary | ICD-10-CM | POA: Insufficient documentation

## 2012-10-23 DIAGNOSIS — M545 Low back pain, unspecified: Secondary | ICD-10-CM | POA: Insufficient documentation

## 2012-10-23 MED ORDER — OXYMORPHONE HCL ER 20 MG PO TB12: 20.0000 mg | ORAL_TABLET | Freq: Two times a day (BID) | ORAL | Status: DC

## 2012-10-23 MED ORDER — OXYCODONE-ACETAMINOPHEN 7.5-325 MG PO TABS: 1.0000 | ORAL_TABLET | ORAL | Status: DC | PRN

## 2012-10-23 MED ORDER — METHOCARBAMOL 500 MG PO TABS: ORAL_TABLET | ORAL | Status: DC

## 2012-10-23 NOTE — Progress Notes (Signed)
Subjective:    Patient ID: Bruce Olson, male    DOB: [birth year], 57 y.o.   MRN: [id number]  HPI The patient is a 57 year old male, who presents with LBP . The symptoms started 12 years ago, after he tried to move a coke machine at work. The patient complains about moderate to severe pain , which radiates to both sides paravertabral, and into his anterior right thigh. Patient also complains about numbness and tingling in the right anterior thigh too . He describes the pain as sharp, stabbing and aching . Applying heat, TENS, taking medications , changing positions alleviate the symptoms. Prolonged standing and walking aggrevates the symptoms. The patient grades his pain as a 9 /10.  Hx of PSF L4-S1 2012, PSF L2-4 2013, by Dr. Yetta Barre. Saw his surgeon last month, CT-scan has revealed that the last PSF has not healed completely. Dr. Yetta Barre suggested another surgery, but WC did not approve. WC did settle with him finally, he states that he will see Dr. Yetta Barre again, after he has moved into his new house/trailer. Patient is wearing an Aspen lumbar brace most of the day, he states, that he is trying to not wear it all the time.  Pain Inventory Average Pain 9 Pain Right Now 9 My pain is sharp, stabbing and tingling  In the last 24 hours, has pain interfered with the following? General activity 9 Relation with others 9 Enjoyment of life 9 What TIME of day is your pain at its worst? all day Sleep (in general) Poor  Pain is worse with: walking, sitting and standing Pain improves with: medication and TENS Relief from Meds: 9  Mobility walk without assistance use a walker how many minutes can you walk? 1/2 mile ability to climb steps?  yes do you drive?  yes  Function disabled: date disabled 01/2010  Neuro/Psych weakness numbness tremor trouble walking  Prior Studies Any changes since last visit?  no  Physicians involved in your care Any changes since last visit?  no   Family  History  Problem Relation Age of Onset  . Anesthesia problems Neg Hx   . Hypotension Neg Hx   . Malignant hyperthermia Neg Hx   . Pseudochol deficiency Neg Hx    History   Social History  . Marital Status: Single    Spouse Name: N/A    Number of Children: N/A  . Years of Education: N/A   Social History Main Topics  . Smoking status: Current Every Day Smoker -- 2.00 packs/day for 33 years    Types: Cigarettes  . Smokeless tobacco: Never Used  . Alcohol Use: Yes     Comment: beer 3x week  . Drug Use: No  . Sexual Activity: No     Comment: back problems   Other Topics Concern  . None   Social History Narrative  . None   Past Surgical History  Procedure Laterality Date  . Back surgery      Back surgery 09/15/2010   Past Medical History  Diagnosis Date  . No pertinent past medical history    BP 119/79  Pulse 66  Resp 14  Ht 5\' 6"  (1.676 m)  Wt 124 lb (56.246 kg)  BMI 20.02 kg/m2  SpO2 97%    Review of Systems  Musculoskeletal: Positive for gait problem.  Neurological: Positive for tremors, weakness and numbness.  All other systems reviewed and are negative.       Objective:   Physical Exam Constitutional: He  is oriented to person, place, and time. He appears well-developed and well-nourished.  HENT:  Head: Normocephalic.  Neck: Neck supple.  Musculoskeletal: He exhibits tenderness.  Neurological: He is alert and oriented to person, place, and time.  Skin: Skin is warm and dry.  Psychiatric: He has a normal mood and affect.  Symmetric normal motor tone is noted throughout. Normal muscle bulk. Muscle testing reveals 5/5 muscle strength of the upper extremity, and 5/5 of the lower extremity. Full range of motion in upper and lower extremities. ROM of spine is restricted. Fine motor movements are normal in both hands.  Sensory is intact and symmetric to light touch, pinprick and proprioception, except right anterior thigh decreased sensation to light touch.   DTR in the upper and lower extremity are present and symmetric 3+. No clonus is noted.  Patient arises from chair with mild difficulty. Narrow based gait with normal arm swing bilateral , able to walk on heels and toes . Tandem walk is stable. No pronator drift. Rhomberg negative.  Patient is wearing an Aspen lumbar brace.  SLR negative, bilateral.        Assessment & Plan:  1. Lumbar postlaminectomy syndrome, Hx of PSF L4-S1 2012, and PSF L2-4 2013, by Dr. Yetta Barre.  His most recent CT-scan showed, that the pedicle screws at L2 are demonstrating mild surrounding Lucency, with the possibility of mild loosening of the pedicle screws.  No evidence of sacroiliac disorder, patient reports that his LBP has gotten worse after a SI injection on the right.  Discussed with patient narcotic analgesics can only reduce pain from 10-30%. He is back on the Opana ER 20mg  bid and Oxycodone 7.5mg  tid. He is also on the Lyrica 100mg  tid, which helps with his radiating symptoms, and on Robaxin 500mg  tid, for his muscle spasms/pain.  Patient is wearing an Aspen lumbar brace most of the day, I advised him to try to not wear it all the time to strengthen his core muscles, while he is walking, for example. Recommended walking or exercising in the pool. The patient states, that he does not have a NCM anymore, and that he can not talk to [facility], everything goes through his lawyer. He has  settled with WC, he will follow up with Dr. Yetta Barre again for possible surgery, after he has moved into his new house.  Follow up in one month.

## 2012-10-23 NOTE — Patient Instructions (Signed)
Continue with your walking  

## 2012-10-26 ENCOUNTER — Other Ambulatory Visit: Payer: Self-pay | Admitting: Physical Medicine and Rehabilitation

## 2012-11-21 ENCOUNTER — Encounter: Payer: Self-pay | Admitting: Physical Medicine and Rehabilitation

## 2012-11-21 ENCOUNTER — Encounter
Payer: Worker's Compensation | Attending: Physical Medicine and Rehabilitation | Admitting: Physical Medicine and Rehabilitation

## 2012-11-21 VITALS — BP 120/70 | HR 62 | Resp 14 | Ht 67.0 in | Wt 121.0 lb

## 2012-11-21 DIAGNOSIS — M961 Postlaminectomy syndrome, not elsewhere classified: Secondary | ICD-10-CM | POA: Insufficient documentation

## 2012-11-21 DIAGNOSIS — Z5181 Encounter for therapeutic drug level monitoring: Secondary | ICD-10-CM

## 2012-11-21 DIAGNOSIS — Z79899 Other long term (current) drug therapy: Secondary | ICD-10-CM

## 2012-11-21 DIAGNOSIS — Z981 Arthrodesis status: Secondary | ICD-10-CM | POA: Insufficient documentation

## 2012-11-21 DIAGNOSIS — F17200 Nicotine dependence, unspecified, uncomplicated: Secondary | ICD-10-CM | POA: Insufficient documentation

## 2012-11-21 DIAGNOSIS — M545 Low back pain, unspecified: Secondary | ICD-10-CM | POA: Insufficient documentation

## 2012-11-21 MED ORDER — OXYMORPHONE HCL ER 20 MG PO TB12: 20.0000 mg | ORAL_TABLET | Freq: Two times a day (BID) | ORAL | Status: DC

## 2012-11-21 MED ORDER — OXYCODONE-ACETAMINOPHEN 7.5-325 MG PO TABS: 1.0000 | ORAL_TABLET | ORAL | Status: DC | PRN

## 2012-11-21 MED ORDER — PREGABALIN 100 MG PO CAPS: ORAL_CAPSULE | ORAL | Status: DC

## 2012-11-21 NOTE — Patient Instructions (Signed)
Stay as active as tolerated, follow up with Dr. Yetta Barre

## 2012-11-21 NOTE — Progress Notes (Signed)
Subjective:    Patient ID: Bruce Olson, male    DOB: 31-Jan-1956, 58 y.o.   MRN: [id number]  HPI The patient is a 57 year old male, who presents with LBP . The symptoms started 12 years ago, after he tried to move a coke machine at work. The patient complains about moderate to severe pain , which radiates to both sides paravertabral, and into his anterior right thigh. Patient also complains about numbness and tingling in the right anterior thigh too . He describes the pain as sharp, stabbing and aching . Applying heat, TENS, taking medications , changing positions alleviate the symptoms. Prolonged standing and walking aggrevates the symptoms. The patient grades his pain as a 9 /10.  Hx of PSF L4-S1 2012, PSF L2-4 2013, by Dr. Yetta Barre. Saw his surgeon last month, CT-scan has revealed that the last PSF has not healed completely. Dr. Yetta Barre suggested another surgery, but WC did not approve. WC did settle with him finally, he states that he will see Dr. Yetta Barre again, after he has moved into his new house/trailer.  Patient is wearing an Aspen lumbar brace most of the day, he states, that he is trying to not wear it all the time.  Pain Inventory Average Pain 9 Pain Right Now 9 My pain is sharp, stabbing and tingling  In the last 24 hours, has pain interfered with the following? General activity 9 Relation with others 9 Enjoyment of life 9 What TIME of day is your pain at its worst? all day Sleep (in general) Poor  Pain is worse with: walking, sitting and standing Pain improves with: medication and TENS Relief from Meds: 9  Mobility walk without assistance use a walker how many minutes can you walk? 1/4 mile ability to climb steps?  yes do you drive?  yes  Function disabled: date disabled 1/ 2012  Neuro/Psych weakness numbness tremor trouble walking  Prior Studies Any changes since last visit?  no  Physicians involved in your care Any changes since last visit?  no   Family  History  Problem Relation Age of Onset  . Anesthesia problems Neg Hx   . Hypotension Neg Hx   . Malignant hyperthermia Neg Hx   . Pseudochol deficiency Neg Hx    History   Social History  . Marital Status: Single    Spouse Name: N/A    Number of Children: N/A  . Years of Education: N/A   Social History Main Topics  . Smoking status: Current Every Day Smoker -- 2.00 packs/day for 33 years    Types: Cigarettes  . Smokeless tobacco: Never Used  . Alcohol Use: Yes     Comment: beer 3x week  . Drug Use: No  . Sexual Activity: No     Comment: back problems   Other Topics Concern  . Not on file   Social History Narrative  . No narrative on file   Past Surgical History  Procedure Laterality Date  . Back surgery      Back surgery 09/15/2010   Past Medical History  Diagnosis Date  . No pertinent past medical history    There were no vitals taken for this visit.     Review of Systems  Musculoskeletal: Positive for back pain and gait problem.  Neurological: Positive for tremors, weakness and numbness.  All other systems reviewed and are negative.       Objective:   Physical Exam Constitutional: He is oriented to person, place, and time. He  appears well-developed and well-nourished.  HENT:  Head: Normocephalic.  Neck: Neck supple.  Musculoskeletal: He exhibits tenderness.  Neurological: He is alert and oriented to person, place, and time.  Skin: Skin is warm and dry.  Psychiatric: He has a normal mood and affect.  Symmetric normal motor tone is noted throughout. Normal muscle bulk. Muscle testing reveals 5/5 muscle strength of the upper extremity, and 5/5 of the lower extremity. Full range of motion in upper and lower extremities. ROM of spine is restricted. Fine motor movements are normal in both hands.  Sensory is intact and symmetric to light touch, pinprick and proprioception, except right anterior thigh decreased sensation to light touch.  DTR in the upper and  lower extremity are present and symmetric 3+. No clonus is noted.  Patient arises from chair with mild difficulty. Narrow based gait with normal arm swing bilateral , able to walk on heels and toes . Tandem walk is stable. No pronator drift. Rhomberg negative.  Patient is wearing an Aspen lumbar brace.  SLR negative, bilateral.        Assessment & Plan:  1. Lumbar postlaminectomy syndrome, Hx of PSF L4-S1 2012, and PSF L2-4 2013, by Dr. Yetta Barre.  His most recent CT-scan showed, that the pedicle screws at L2 are demonstrating mild surrounding Lucency, with the possibility of mild loosening of the pedicle screws.  No evidence of sacroiliac disorder, patient reports that his LBP has gotten worse after a SI injection on the right.  Discussed with patient narcotic analgesics can only reduce pain from 10-30%. He is back on the Opana ER 20mg  bid and Oxycodone 7.5mg  tid. He is also on the Lyrica 100mg  tid, which helps with his radiating symptoms, and on Robaxin 500mg  tid, for his muscle spasms/pain.  Patient is wearing an Aspen lumbar brace most of the day, I advised him to try to not wear it all the time to strengthen his core muscles, while he is walking, for example. Recommended walking or exercising in the pool. The patient states, that he does not have a NCM anymore, and that he can not talk to [facility], everything goes through his lawyer. He has settled with WC, he will follow up with Dr. Yetta Barre again for possible surgery, after he has moved into his new house.  Follow up in one month.

## 2012-12-17 ENCOUNTER — Encounter: Payer: Self-pay | Admitting: Physical Medicine and Rehabilitation

## 2012-12-17 ENCOUNTER — Encounter
Payer: Worker's Compensation | Attending: Physical Medicine and Rehabilitation | Admitting: Physical Medicine and Rehabilitation

## 2012-12-17 VITALS — BP 125/66 | HR 74 | Resp 14 | Ht 66.0 in | Wt 122.0 lb

## 2012-12-17 DIAGNOSIS — Z79899 Other long term (current) drug therapy: Secondary | ICD-10-CM | POA: Insufficient documentation

## 2012-12-17 DIAGNOSIS — G8929 Other chronic pain: Secondary | ICD-10-CM

## 2012-12-17 DIAGNOSIS — M545 Low back pain: Secondary | ICD-10-CM

## 2012-12-17 DIAGNOSIS — M961 Postlaminectomy syndrome, not elsewhere classified: Secondary | ICD-10-CM | POA: Insufficient documentation

## 2012-12-17 DIAGNOSIS — Z981 Arthrodesis status: Secondary | ICD-10-CM | POA: Insufficient documentation

## 2012-12-17 MED ORDER — OXYCODONE-ACETAMINOPHEN 7.5-325 MG PO TABS: 1.0000 | ORAL_TABLET | ORAL | Status: DC | PRN

## 2012-12-17 MED ORDER — OXYMORPHONE HCL ER 20 MG PO TB12: 20.0000 mg | ORAL_TABLET | Freq: Two times a day (BID) | ORAL | Status: DC

## 2012-12-17 NOTE — Progress Notes (Signed)
Subjective:    Patient ID: Bruce Olson, male    DOB: 10-Feb-1955, 57 y.o.   MRN: [id number]  HPI The patient is a 57 year old male, who presents with LBP . The symptoms started 12 years ago, after he tried to move a coke machine at work. The patient complains about moderate to severe pain , which radiates to both sides paravertabral, and into his anterior right thigh. Patient also complains about numbness and tingling in the right anterior thigh too . He describes the pain as sharp, stabbing and aching . Applying heat, TENS, taking medications , changing positions alleviate the symptoms. Prolonged standing and walking aggrevates the symptoms. The patient grades his pain as a 9 /10.  Hx of PSF L4-S1 2012, PSF L2-4 2013, by Dr. Yetta Barre. Saw his surgeon last month, CT-scan has revealed that the last PSF has not healed completely. Dr. Yetta Barre suggested another surgery, but WC did not approve. WC did settle with him finally, he states that he is planing to see Dr. Yetta Barre again . Patient is wearing an Aspen lumbar brace mostly when he leaves his house.   Pain Inventory Average Pain 9 Pain Right Now 9 My pain is sharp, stabbing and tingling  In the last 24 hours, has pain interfered with the following? General activity 9 Relation with others 9 Enjoyment of life 9 What TIME of day is your pain at its worst? all Sleep (in general) Poor  Pain is worse with: walking, sitting and standing Pain improves with: medication and TENS Relief from Meds: 9  Mobility walk without assistance use a walker how many minutes can you walk? 30 ability to climb steps?  yes do you drive?  yes  Function disabled: date disabled 2012  Neuro/Psych weakness numbness tremor trouble walking  Prior Studies Any changes since last visit?  no  Physicians involved in your care Any changes since last visit?  no   Family History  Problem Relation Age of Onset  . Anesthesia problems Neg Hx   . Hypotension Neg  Hx   . Malignant hyperthermia Neg Hx   . Pseudochol deficiency Neg Hx    History   Social History  . Marital Status: Single    Spouse Name: N/A    Number of Children: N/A  . Years of Education: N/A   Social History Main Topics  . Smoking status: Current Every Day Smoker -- 2.00 packs/day for 33 years    Types: Cigarettes  . Smokeless tobacco: Never Used  . Alcohol Use: Yes     Comment: beer 3x week  . Drug Use: No  . Sexual Activity: No     Comment: back problems   Other Topics Concern  . None   Social History Narrative  . None   Past Surgical History  Procedure Laterality Date  . Back surgery      Back surgery 09/15/2010   Past Medical History  Diagnosis Date  . No pertinent past medical history    BP 125/66  Pulse 74  Resp 14  Ht 5\' 6"  (1.676 m)  Wt 122 lb (55.339 kg)  BMI 19.70 kg/m2  SpO2 96%    Review of Systems  Musculoskeletal: Positive for gait problem.  Neurological: Positive for tremors, weakness and numbness.  All other systems reviewed and are negative.       Objective:   Physical Exam Constitutional: He is oriented to person, place, and time. He appears well-developed and well-nourished.  HENT:  Head: Normocephalic.  Neck: Neck supple.  Musculoskeletal: He exhibits tenderness.  Neurological: He is alert and oriented to person, place, and time.  Skin: Skin is warm and dry.  Psychiatric: He has a normal mood and affect.  Symmetric normal motor tone is noted throughout. Normal muscle bulk. Muscle testing reveals 5/5 muscle strength of the upper extremity, and 5/5 of the lower extremity. Full range of motion in upper and lower extremities. ROM of spine is restricted. Fine motor movements are normal in both hands.  Sensory is intact and symmetric to light touch, pinprick and proprioception, except right anterior thigh decreased sensation to light touch.  DTR in the upper and lower extremity are present and symmetric 3+. No clonus is noted.   Patient arises from chair with mild difficulty. Narrow based gait with normal arm swing bilateral , able to walk on heels and toes . Tandem walk is stable. No pronator drift. Rhomberg negative.  Patient is wearing an Aspen lumbar brace.  SLR negative, bilateral.        Assessment & Plan:  1. Lumbar postlaminectomy syndrome, Hx of PSF L4-S1 2012, and PSF L2-4 2013, by Dr. Yetta Barre.  His most recent CT-scan showed, that the pedicle screws at L2 are demonstrating mild surrounding Lucency, with the possibility of mild loosening of the pedicle screws.  No evidence of sacroiliac disorder, patient reports that his LBP has gotten worse after a SI injection on the right.  Discussed with patient narcotic analgesics can only reduce pain from 10-30%. He is back on the Opana ER 20mg  bid and Oxycodone 7.5mg  tid. He is also on the Lyrica 100mg  tid, which helps with his radiating symptoms, and on Robaxin 500mg  tid, for his muscle spasms/pain, prn.  Patient is wearing an Aspen lumbar brace most of the day, I advised him to try to not wear it all the time to strengthen his core muscles, while he is walking, for example.He has settled with WC, he will follow up with Dr. Yetta Barre again for possible surgery,in the next month.   Follow up in one month.

## 2012-12-17 NOTE — Patient Instructions (Addendum)
Try to walk regularly, as tolerated. Follow up with your neurosurgeon, Dr. Yetta Barre

## 2012-12-25 ENCOUNTER — Other Ambulatory Visit: Payer: Self-pay | Admitting: Physical Medicine and Rehabilitation

## 2013-01-09 ENCOUNTER — Other Ambulatory Visit: Payer: Self-pay | Admitting: *Deleted

## 2013-01-09 MED ORDER — OXYCODONE-ACETAMINOPHEN 7.5-325 MG PO TABS: 1.0000 | ORAL_TABLET | ORAL | Status: DC | PRN

## 2013-01-09 MED ORDER — OXYMORPHONE HCL ER 20 MG PO TB12: 20.0000 mg | ORAL_TABLET | Freq: Two times a day (BID) | ORAL | Status: DC

## 2013-01-09 NOTE — Telephone Encounter (Signed)
RX printed early for controlled medication for the visit with RN on 01/15/13 (to be signed by MD) 

## 2013-01-15 ENCOUNTER — Encounter: Payer: Self-pay | Admitting: *Deleted

## 2013-01-15 ENCOUNTER — Encounter: Payer: BC Managed Care – PPO | Attending: Physical Medicine & Rehabilitation | Admitting: *Deleted

## 2013-01-15 VITALS — BP 132/57 | HR 68 | Resp 14 | Ht 66.0 in | Wt 122.0 lb

## 2013-01-15 DIAGNOSIS — M5416 Radiculopathy, lumbar region: Secondary | ICD-10-CM

## 2013-01-15 DIAGNOSIS — M961 Postlaminectomy syndrome, not elsewhere classified: Secondary | ICD-10-CM | POA: Insufficient documentation

## 2013-01-15 MED ORDER — METHOCARBAMOL 500 MG PO TABS: 500.0000 mg | ORAL_TABLET | Freq: Three times a day (TID) | ORAL | Status: DC

## 2013-01-15 NOTE — Patient Instructions (Signed)
Follow up one month with RN for pill count and med refill 

## 2013-01-15 NOTE — Progress Notes (Signed)
Here for pill count and medication refills.  Opana 20 mg ER Fill date 12/25/12   Today NV#21  Oxycodone apap 7.5/325  Fill date 12/25/12 # 90   Today NV# 24  Pill counts are appropriate.  He has an appointment with Dr Yetta Barre about having surgery again on his back 01/21/13.  Refills given.  Return in one month for med refills and pill count with RN

## 2013-01-28 ENCOUNTER — Other Ambulatory Visit: Payer: Self-pay | Admitting: Neurological Surgery

## 2013-01-28 DIAGNOSIS — IMO0002 Reserved for concepts with insufficient information to code with codable children: Secondary | ICD-10-CM

## 2013-01-30 ENCOUNTER — Ambulatory Visit
Admission: RE | Admit: 2013-01-30 | Discharge: 2013-01-30 | Disposition: A | Discharge status: Home / Self Care | Payer: BC Managed Care – PPO | Source: Ambulatory Visit | Attending: Neurological Surgery | Admitting: Neurological Surgery

## 2013-01-30 VITALS — BP 96/30 | HR 63

## 2013-01-30 DIAGNOSIS — IMO0002 Reserved for concepts with insufficient information to code with codable children: Secondary | ICD-10-CM

## 2013-01-30 DIAGNOSIS — M5416 Radiculopathy, lumbar region: Secondary | ICD-10-CM

## 2013-01-30 DIAGNOSIS — M961 Postlaminectomy syndrome, not elsewhere classified: Secondary | ICD-10-CM

## 2013-01-30 MED ORDER — IOHEXOL 180 MG/ML  SOLN
15.0000 mL | Freq: Once | INTRAMUSCULAR | Status: AC | PRN
  Administered 2013-01-30: 15 mL via INTRATHECAL

## 2013-01-30 MED ORDER — DIAZEPAM 5 MG PO TABS
10.0000 mg | ORAL_TABLET | Freq: Once | ORAL | Status: AC
  Administered 2013-01-30: 10 mg via ORAL

## 2013-02-07 ENCOUNTER — Other Ambulatory Visit: Payer: Self-pay | Admitting: *Deleted

## 2013-02-07 MED ORDER — OXYMORPHONE HCL ER 20 MG PO TB12: 20.0000 mg | ORAL_TABLET | Freq: Two times a day (BID) | ORAL | Status: DC

## 2013-02-07 MED ORDER — OXYCODONE-ACETAMINOPHEN 7.5-325 MG PO TABS: 1.0000 | ORAL_TABLET | ORAL | Status: DC | PRN

## 2013-02-07 NOTE — Telephone Encounter (Signed)
RX printed early for controlled medication for the visit with RN on 02/12/13 (to be signed by MD)

## 2013-02-12 ENCOUNTER — Encounter: Payer: BC Managed Care – PPO | Attending: Physical Medicine & Rehabilitation | Admitting: *Deleted

## 2013-02-12 ENCOUNTER — Encounter: Payer: Self-pay | Admitting: *Deleted

## 2013-02-12 VITALS — BP 127/71 | HR 70 | Resp 14

## 2013-02-12 DIAGNOSIS — M5416 Radiculopathy, lumbar region: Secondary | ICD-10-CM

## 2013-02-12 DIAGNOSIS — M961 Postlaminectomy syndrome, not elsewhere classified: Secondary | ICD-10-CM

## 2013-02-12 DIAGNOSIS — IMO0002 Reserved for concepts with insufficient information to code with codable children: Secondary | ICD-10-CM | POA: Insufficient documentation

## 2013-02-12 DIAGNOSIS — Z79899 Other long term (current) drug therapy: Secondary | ICD-10-CM | POA: Insufficient documentation

## 2013-02-12 NOTE — Progress Notes (Signed)
Here for pill count and medication refills. opana ER 20 mg # 60 Fill date12/23/14    Today NV#24  Percocet 7.5/325 # 29  Today NV# 89 Today NV# 28  Pill counts are appropriate  VSS    Pain level:9  He had a myelogram about 3 weeks ago but has not been given the results yet from Dr Ronnald Ramp office.  Return to see Dr Letta Pate or PA in one month.

## 2013-02-12 NOTE — Patient Instructions (Signed)
Follow up with DR Letta Pate or PA if no appointment available

## 2013-03-03 HISTORY — PX: BACK SURGERY: SHX140

## 2013-03-04 ENCOUNTER — Other Ambulatory Visit: Payer: Self-pay | Admitting: Neurological Surgery

## 2013-03-09 NOTE — Pre-Procedure Instructions (Signed)
Bruce Olson  03/09/2013   Your procedure is scheduled on:  February 12  Report to [facility]" [address] at 09:00 AM.  Call this number if you have problems the morning of surgery: [phone]   Remember:   Do not eat food or drink liquids after midnight.   Take these medicines the morning of surgery with A SIP OF WATER: Oxycodone (if needed), Opana, Lyrica   STOP Multiple Vitamins, Ibuprofen today   STOP/ Do not take Aspirin, Aleve, Naproxen, Advil, Ibuprofen, Vitamin, Herbs, or Supplements starting today   Do not wear jewelry, make-up or nail polish.  Do not wear lotions, powders, or perfumes. You may wear deodorant.  Do not shave 48 hours prior to surgery. Men may shave face and neck.  Do not bring valuables to the hospital.  [facility] is not responsible                  for any belongings or valuables.               Contacts, dentures or bridgework may not be worn into surgery.  Leave suitcase in the car. After surgery it may be brought to your room.  For patients admitted to the hospital, discharge time is determined by your                treatment team.               Patients discharged the day of surgery will not be allowed to drive  home.  Name and phone number of your driver: Family/ Friend  Special Instructions: Use CHG bath as directed below   Please read over the following fact sheets that you were given: Pain Booklet, Coughing and Deep Breathing and Surgical Site Infection Prevention  [place] - Preparing for Surgery  Before surgery, you can play an important role.  Because skin is not sterile, your skin needs to be as free of germs as possible.  You can reduce the number of germs on you skin by washing with CHG (chlorahexidine gluconate) soap before surgery.  CHG is an antiseptic cleaner which kills germs and bonds with the skin to continue killing germs even after washing.  Please DO NOT use if you have an  allergy to CHG or antibacterial soaps.  If your skin becomes reddened/irritated stop using the CHG and inform your nurse when you arrive at Short Stay.  Do not shave (including legs and underarms) for at least 48 hours prior to the first CHG shower.  You may shave your face.  Please follow these instructions carefully:   1.  Shower with CHG Soap the night before surgery and the                                morning of Surgery.  2.  If you choose to wash your hair, wash your hair first as usual with your       normal shampoo.  3.  After you shampoo, rinse your hair and body thoroughly to remove the                      Shampoo.  4.  Use CHG as you would any other liquid soap.  You can apply chg directly       to the skin and wash gently with scrungie or  a clean washcloth.  5.  Apply the CHG Soap to your body ONLY FROM THE NECK DOWN.        Do not use on open wounds or open sores.  Avoid contact with your eyes,       ears, mouth and genitals (private parts).  Wash genitals (private parts)       with your normal soap.  6.  Wash thoroughly, paying special attention to the area where your surgery        will be performed.  7.  Thoroughly rinse your body with warm water from the neck down.  8.  DO NOT shower/wash with your normal soap after using and rinsing off       the CHG Soap.  9.  Pat yourself dry with a clean towel.            10.  Wear clean pajamas.            11.  Place clean sheets on your bed the night of your first shower and do not        sleep with pets.  Day of Surgery  Do not apply any lotions the morning of surgery.  Please wear clean clothes to the hospital/surgery center.

## 2013-03-11 ENCOUNTER — Encounter (HOSPITAL_COMMUNITY)
Admission: RE | Admit: 2013-03-11 | Discharge: 2013-03-11 | Disposition: A | Discharge status: Home / Self Care | Payer: Medicare Other | Source: Ambulatory Visit | Attending: Neurological Surgery | Admitting: Neurological Surgery

## 2013-03-11 ENCOUNTER — Encounter (HOSPITAL_COMMUNITY): Payer: Self-pay

## 2013-03-11 ENCOUNTER — Other Ambulatory Visit: Payer: Self-pay | Admitting: Neurological Surgery

## 2013-03-11 DIAGNOSIS — Z01812 Encounter for preprocedural laboratory examination: Secondary | ICD-10-CM | POA: Diagnosis not present

## 2013-03-11 DIAGNOSIS — Z01818 Encounter for other preprocedural examination: Secondary | ICD-10-CM | POA: Diagnosis not present

## 2013-03-11 DIAGNOSIS — Z01810 Encounter for preprocedural cardiovascular examination: Secondary | ICD-10-CM | POA: Diagnosis not present

## 2013-03-11 HISTORY — DX: Injury, unspecified, initial encounter: T14.90XA

## 2013-03-11 LAB — CBC WITH DIFFERENTIAL/PLATELET
BASOS ABS: 0 10*3/uL (ref 0.0–0.1)
BASOS PCT: 0 % (ref 0–1)
EOS PCT: 2 % (ref 0–5)
Eosinophils Absolute: 0.2 10*3/uL (ref 0.0–0.7)
HEMATOCRIT: 39.9 % (ref 39.0–52.0)
Hemoglobin: 13.8 g/dL (ref 13.0–17.0)
LYMPHS PCT: 34 % (ref 12–46)
Lymphs Abs: 2.4 10*3/uL (ref 0.7–4.0)
MCH: 31.4 pg (ref 26.0–34.0)
MCHC: 34.6 g/dL (ref 30.0–36.0)
MCV: 90.9 fL (ref 78.0–100.0)
MONOS PCT: 9 % (ref 3–12)
Monocytes Absolute: 0.6 10*3/uL (ref 0.1–1.0)
Neutro Abs: 3.8 10*3/uL (ref 1.7–7.7)
Neutrophils Relative %: 55 % (ref 43–77)
Platelets: 250 10*3/uL (ref 150–400)
RBC: 4.39 MIL/uL (ref 4.22–5.81)
RDW: 12.5 % (ref 11.5–15.5)
WBC: 6.9 10*3/uL (ref 4.0–10.5)

## 2013-03-11 LAB — BASIC METABOLIC PANEL
BUN: 9 mg/dL (ref 6–23)
CHLORIDE: 103 meq/L (ref 96–112)
CO2: 27 meq/L (ref 19–32)
Calcium: 9.2 mg/dL (ref 8.4–10.5)
Creatinine, Ser: 0.77 mg/dL (ref 0.50–1.35)
GFR calc Af Amer: >90 mL/min (ref >90)
GFR calc non Af Amer: >90 mL/min (ref >90)
Glucose, Bld: 81 mg/dL (ref 70–99)
Potassium: 4.3 mEq/L (ref 3.7–5.3)
Sodium: 142 mEq/L (ref 137–147)

## 2013-03-11 LAB — PROTIME-INR
INR: 0.91 (ref 0.00–1.49)
Prothrombin Time: 12.1 seconds (ref 11.6–15.2)

## 2013-03-11 LAB — SURGICAL PCR SCREEN
MRSA, PCR: NEGATIVE
STAPHYLOCOCCUS AUREUS: NEGATIVE

## 2013-03-11 NOTE — Progress Notes (Signed)
Call to Dr. Ronnald Ramp office, spoke with Lorriane Shire, asked for more complete order for operative consent.

## 2013-03-11 NOTE — Progress Notes (Signed)
No PCP. Stress test at [facility], "so long ago I can't remember when". States all was well- no need for follow up.

## 2013-03-12 ENCOUNTER — Telehealth: Payer: Self-pay

## 2013-03-12 NOTE — Telephone Encounter (Signed)
Patient called to cancel his appt on 2/12. He is having back surgery. Patient was requesting a refill on his medication. I informed him that the surgeon would prescribe his medication for him after surgery. Then after he was released from the surgeon we would start prescribing again.

## 2013-03-13 MED ORDER — DEXAMETHASONE SODIUM PHOSPHATE 10 MG/ML IJ SOLN
10.0000 mg | INTRAMUSCULAR | Status: AC
  Administered 2013-03-14: 10 mg via INTRAVENOUS
  Filled 2013-03-13: qty 1

## 2013-03-13 MED ORDER — CEFAZOLIN SODIUM-DEXTROSE 2-3 GM-% IV SOLR
2.0000 g | INTRAVENOUS | Status: AC
  Administered 2013-03-14: 2 g via INTRAVENOUS
  Filled 2013-03-13: qty 50

## 2013-03-14 ENCOUNTER — Ambulatory Visit (HOSPITAL_COMMUNITY): Payer: Medicare Other | Admitting: Anesthesiology

## 2013-03-14 ENCOUNTER — Ambulatory Visit: Payer: BC Managed Care – PPO | Admitting: Physical Medicine & Rehabilitation

## 2013-03-14 ENCOUNTER — Encounter (HOSPITAL_COMMUNITY): Payer: Self-pay | Admitting: Surgery

## 2013-03-14 ENCOUNTER — Encounter (HOSPITAL_COMMUNITY): Payer: Medicare Other | Admitting: Anesthesiology

## 2013-03-14 ENCOUNTER — Observation Stay (HOSPITAL_COMMUNITY)
Admission: RE | Admit: 2013-03-14 | Discharge: 2013-03-15 | Disposition: A | Discharge status: Home / Self Care | Payer: Medicare Other | Source: Ambulatory Visit | Attending: Neurological Surgery | Admitting: Neurological Surgery

## 2013-03-14 ENCOUNTER — Encounter (HOSPITAL_COMMUNITY)
Admission: RE | Disposition: A | Discharge status: Home / Self Care | Payer: Self-pay | Source: Ambulatory Visit | Attending: Neurological Surgery

## 2013-03-14 DIAGNOSIS — T8489XA Other specified complication of internal orthopedic prosthetic devices, implants and grafts, initial encounter: Secondary | ICD-10-CM | POA: Diagnosis present

## 2013-03-14 DIAGNOSIS — Z79899 Other long term (current) drug therapy: Secondary | ICD-10-CM | POA: Insufficient documentation

## 2013-03-14 DIAGNOSIS — Z9889 Other specified postprocedural states: Secondary | ICD-10-CM

## 2013-03-14 DIAGNOSIS — F17200 Nicotine dependence, unspecified, uncomplicated: Secondary | ICD-10-CM | POA: Diagnosis not present

## 2013-03-14 DIAGNOSIS — M549 Dorsalgia, unspecified: Secondary | ICD-10-CM | POA: Diagnosis not present

## 2013-03-14 DIAGNOSIS — Y831 Surgical operation with implant of artificial internal device as the cause of abnormal reaction of the patient, or of later complication, without mention of misadventure at the time of the procedure: Secondary | ICD-10-CM | POA: Insufficient documentation

## 2013-03-14 HISTORY — PX: HARDWARE REMOVAL: SHX979

## 2013-03-14 SURGERY — REMOVAL, HARDWARE
Anesthesia: General | Site: Back

## 2013-03-14 MED ORDER — OXYCODONE HCL 5 MG/5ML PO SOLN: 5.0000 mg | Freq: Once | ORAL | Status: DC | PRN

## 2013-03-14 MED ORDER — MEPERIDINE HCL 25 MG/ML IJ SOLN: 6.2500 mg | INTRAMUSCULAR | Status: DC | PRN

## 2013-03-14 MED ORDER — FENTANYL CITRATE 0.05 MG/ML IJ SOLN
INTRAMUSCULAR | Status: AC
  Filled 2013-03-14: qty 5

## 2013-03-14 MED ORDER — PREGABALIN 100 MG PO CAPS
100.0000 mg | ORAL_CAPSULE | Freq: Three times a day (TID) | ORAL | Status: DC
  Administered 2013-03-14 – 2013-03-15 (×3): 100 mg via ORAL
  Filled 2013-03-14 (×3): qty 1

## 2013-03-14 MED ORDER — HYDROMORPHONE HCL PF 1 MG/ML IJ SOLN
INTRAMUSCULAR | Status: AC
  Administered 2013-03-14: 0.5 mg
  Filled 2013-03-14: qty 2

## 2013-03-14 MED ORDER — PHENYLEPHRINE HCL 10 MG/ML IJ SOLN
INTRAMUSCULAR | Status: DC | PRN
  Administered 2013-03-14: 120 ug via INTRAVENOUS
  Administered 2013-03-14: 80 ug via INTRAVENOUS
  Administered 2013-03-14: 40 ug via INTRAVENOUS
  Administered 2013-03-14 (×2): 80 ug via INTRAVENOUS

## 2013-03-14 MED ORDER — FENTANYL CITRATE 0.05 MG/ML IJ SOLN
INTRAMUSCULAR | Status: DC | PRN
  Administered 2013-03-14: 50 ug via INTRAVENOUS
  Administered 2013-03-14: 150 ug via INTRAVENOUS
  Administered 2013-03-14: 50 ug via INTRAVENOUS

## 2013-03-14 MED ORDER — CEFAZOLIN SODIUM 1-5 GM-% IV SOLN
1.0000 g | Freq: Three times a day (TID) | INTRAVENOUS | Status: AC
  Administered 2013-03-14 – 2013-03-15 (×2): 1 g via INTRAVENOUS
  Filled 2013-03-14 (×2): qty 50

## 2013-03-14 MED ORDER — MIDAZOLAM HCL 5 MG/5ML IJ SOLN
INTRAMUSCULAR | Status: DC | PRN
  Administered 2013-03-14: 2 mg via INTRAVENOUS

## 2013-03-14 MED ORDER — ONDANSETRON HCL 4 MG/2ML IJ SOLN: 4.0000 mg | Freq: Once | INTRAMUSCULAR | Status: DC | PRN

## 2013-03-14 MED ORDER — SODIUM CHLORIDE 0.9 % IJ SOLN
3.0000 mL | INTRAMUSCULAR | Status: DC | PRN
Start: 2013-03-14 — End: 2013-03-15
  Administered 2013-03-15: 3 mL via INTRAVENOUS

## 2013-03-14 MED ORDER — ACETAMINOPHEN 325 MG PO TABS: 650.0000 mg | ORAL_TABLET | ORAL | Status: DC | PRN

## 2013-03-14 MED ORDER — HYDROMORPHONE HCL PF 1 MG/ML IJ SOLN
INTRAMUSCULAR | Status: AC
  Administered 2013-03-14: 0.5 mg
  Filled 2013-03-14: qty 1

## 2013-03-14 MED ORDER — LIDOCAINE HCL (CARDIAC) 20 MG/ML IV SOLN
INTRAVENOUS | Status: DC | PRN
  Administered 2013-03-14: 100 mg via INTRAVENOUS

## 2013-03-14 MED ORDER — METHOCARBAMOL 500 MG PO TABS
ORAL_TABLET | ORAL | Status: AC
  Administered 2013-03-14: 500 mg via ORAL
  Filled 2013-03-14: qty 1

## 2013-03-14 MED ORDER — OXYCODONE-ACETAMINOPHEN 5-325 MG PO TABS
ORAL_TABLET | ORAL | Status: AC
  Administered 2013-03-14: 2 via ORAL
  Filled 2013-03-14: qty 2

## 2013-03-14 MED ORDER — LIDOCAINE HCL (CARDIAC) 20 MG/ML IV SOLN
INTRAVENOUS | Status: AC
  Filled 2013-03-14: qty 5

## 2013-03-14 MED ORDER — MENTHOL 3 MG MT LOZG: 1.0000 | LOZENGE | OROMUCOSAL | Status: DC | PRN

## 2013-03-14 MED ORDER — NICOTINE 21 MG/24HR TD PT24
21.0000 mg | MEDICATED_PATCH | Freq: Every day | TRANSDERMAL | Status: DC
  Administered 2013-03-14 – 2013-03-15 (×2): 21 mg via TRANSDERMAL
  Filled 2013-03-14 (×2): qty 1

## 2013-03-14 MED ORDER — OXYCODONE-ACETAMINOPHEN 5-325 MG PO TABS
1.0000 | ORAL_TABLET | ORAL | Status: DC | PRN
  Administered 2013-03-14 – 2013-03-15 (×5): 2 via ORAL
  Filled 2013-03-14 (×4): qty 2

## 2013-03-14 MED ORDER — OXYCODONE HCL 5 MG PO TABS: 5.0000 mg | ORAL_TABLET | Freq: Once | ORAL | Status: DC | PRN

## 2013-03-14 MED ORDER — SODIUM CHLORIDE 0.9 % IJ SOLN
3.0000 mL | Freq: Two times a day (BID) | INTRAMUSCULAR | Status: DC
  Administered 2013-03-14: 3 mL via INTRAVENOUS

## 2013-03-14 MED ORDER — HEMOSTATIC AGENTS (NO CHARGE) OPTIME
TOPICAL | Status: DC | PRN
  Administered 2013-03-14: 1 via TOPICAL

## 2013-03-14 MED ORDER — EPHEDRINE SULFATE 50 MG/ML IJ SOLN
INTRAMUSCULAR | Status: DC | PRN
  Administered 2013-03-14: 10 mg via INTRAVENOUS
  Administered 2013-03-14 (×3): 5 mg via INTRAVENOUS

## 2013-03-14 MED ORDER — 0.9 % SODIUM CHLORIDE (POUR BTL) OPTIME
TOPICAL | Status: DC | PRN
  Administered 2013-03-14: 1000 mL

## 2013-03-14 MED ORDER — BUPIVACAINE HCL (PF) 0.25 % IJ SOLN
INTRAMUSCULAR | Status: DC | PRN
  Administered 2013-03-14: 6 mL

## 2013-03-14 MED ORDER — DEXTROSE 5 % IV SOLN: 500.0000 mg | Freq: Four times a day (QID) | INTRAVENOUS | Status: DC | PRN

## 2013-03-14 MED ORDER — ROCURONIUM BROMIDE 100 MG/10ML IV SOLN
INTRAVENOUS | Status: DC | PRN
  Administered 2013-03-14: 40 mg via INTRAVENOUS

## 2013-03-14 MED ORDER — MIDAZOLAM HCL 2 MG/2ML IJ SOLN
INTRAMUSCULAR | Status: AC
  Filled 2013-03-14: qty 2

## 2013-03-14 MED ORDER — BACITRACIN 50000 UNITS IM SOLR
INTRAMUSCULAR | Status: DC | PRN
  Administered 2013-03-14: 11:00:00

## 2013-03-14 MED ORDER — SODIUM CHLORIDE 0.9 % IV SOLN: 250.0000 mL | INTRAVENOUS | Status: DC

## 2013-03-14 MED ORDER — PHENYLEPHRINE 40 MCG/ML (10ML) SYRINGE FOR IV PUSH (FOR BLOOD PRESSURE SUPPORT)
PREFILLED_SYRINGE | INTRAVENOUS | Status: AC
Start: 2013-03-14 — End: 2013-03-14
  Filled 2013-03-14: qty 10

## 2013-03-14 MED ORDER — PROPOFOL 10 MG/ML IV BOLUS
INTRAVENOUS | Status: DC | PRN
  Administered 2013-03-14: 100 mg via INTRAVENOUS

## 2013-03-14 MED ORDER — METHOCARBAMOL 500 MG PO TABS
500.0000 mg | ORAL_TABLET | Freq: Four times a day (QID) | ORAL | Status: DC | PRN
  Administered 2013-03-14 – 2013-03-15 (×3): 500 mg via ORAL
  Filled 2013-03-14 (×2): qty 1

## 2013-03-14 MED ORDER — ONDANSETRON HCL 4 MG/2ML IJ SOLN
INTRAMUSCULAR | Status: DC | PRN
  Administered 2013-03-14: 4 mg via INTRAVENOUS

## 2013-03-14 MED ORDER — ACETAMINOPHEN 650 MG RE SUPP: 650.0000 mg | RECTAL | Status: DC | PRN

## 2013-03-14 MED ORDER — PHENOL 1.4 % MT LIQD: 1.0000 | OROMUCOSAL | Status: DC | PRN

## 2013-03-14 MED ORDER — MORPHINE SULFATE 2 MG/ML IJ SOLN
1.0000 mg | INTRAMUSCULAR | Status: DC | PRN
  Administered 2013-03-14 – 2013-03-15 (×3): 4 mg via INTRAVENOUS
  Filled 2013-03-14 (×3): qty 2

## 2013-03-14 MED ORDER — ONDANSETRON HCL 4 MG/2ML IJ SOLN
INTRAMUSCULAR | Status: AC
  Filled 2013-03-14: qty 2

## 2013-03-14 MED ORDER — POTASSIUM CHLORIDE IN NACL 20-0.9 MEQ/L-% IV SOLN
INTRAVENOUS | Status: DC
  Filled 2013-03-14 (×3): qty 1000

## 2013-03-14 MED ORDER — THROMBIN 5000 UNITS EX SOLR
CUTANEOUS | Status: DC | PRN
  Administered 2013-03-14 (×2): 5000 [IU] via TOPICAL

## 2013-03-14 MED ORDER — ROCURONIUM BROMIDE 50 MG/5ML IV SOLN
INTRAVENOUS | Status: AC
  Filled 2013-03-14: qty 1

## 2013-03-14 MED ORDER — CELECOXIB 200 MG PO CAPS
200.0000 mg | ORAL_CAPSULE | Freq: Two times a day (BID) | ORAL | Status: DC
  Administered 2013-03-14: 200 mg via ORAL
  Filled 2013-03-14 (×3): qty 1

## 2013-03-14 MED ORDER — ONDANSETRON HCL 4 MG/2ML IJ SOLN: 4.0000 mg | INTRAMUSCULAR | Status: DC | PRN

## 2013-03-14 MED ORDER — LACTATED RINGERS IV SOLN
INTRAVENOUS | Status: DC
  Administered 2013-03-14 (×2): via INTRAVENOUS

## 2013-03-14 MED ORDER — GLYCOPYRROLATE 0.2 MG/ML IJ SOLN
INTRAMUSCULAR | Status: AC
  Filled 2013-03-14: qty 2

## 2013-03-14 MED ORDER — HYDROMORPHONE HCL PF 1 MG/ML IJ SOLN
0.2500 mg | INTRAMUSCULAR | Status: DC | PRN
  Administered 2013-03-14 (×4): 0.5 mg via INTRAVENOUS

## 2013-03-14 MED ORDER — PROPOFOL 10 MG/ML IV BOLUS
INTRAVENOUS | Status: AC
  Filled 2013-03-14: qty 20

## 2013-03-14 MED ORDER — GLYCOPYRROLATE 0.2 MG/ML IJ SOLN
INTRAMUSCULAR | Status: DC | PRN
  Administered 2013-03-14: 0.4 mg via INTRAVENOUS

## 2013-03-14 MED ORDER — NEOSTIGMINE METHYLSULFATE 1 MG/ML IJ SOLN
INTRAMUSCULAR | Status: DC | PRN
  Administered 2013-03-14: 3 mg via INTRAVENOUS

## 2013-03-14 SURGICAL SUPPLY — 50 items
BAG DECANTER FOR FLEXI CONT (MISCELLANEOUS) ×3 IMPLANT
BENZOIN TINCTURE PRP APPL 2/3 (GAUZE/BANDAGES/DRESSINGS) ×3 IMPLANT
BUR MATCHSTICK NEURO 3.0 LAGG (BURR) ×6 IMPLANT
CANISTER SUCT 3000ML (MISCELLANEOUS) ×3 IMPLANT
CLOSURE WOUND 1/2 X4 (GAUZE/BANDAGES/DRESSINGS) ×1
CONT SPEC 4OZ CLIKSEAL STRL BL (MISCELLANEOUS) ×3 IMPLANT
DRAPE LAPAROTOMY 100X72X124 (DRAPES) ×3 IMPLANT
DRAPE MICROSCOPE ZEISS OPMI (DRAPES) ×3 IMPLANT
DRAPE POUCH INSTRU U-SHP 10X18 (DRAPES) ×3 IMPLANT
DRAPE SURG 17X23 STRL (DRAPES) ×3 IMPLANT
DRESSING TELFA 8X3 (GAUZE/BANDAGES/DRESSINGS) ×3 IMPLANT
DRSG OPSITE 4X5.5 SM (GAUZE/BANDAGES/DRESSINGS) ×3 IMPLANT
DRSG OPSITE POSTOP 4X6 (GAUZE/BANDAGES/DRESSINGS) ×3 IMPLANT
DURAPREP 26ML APPLICATOR (WOUND CARE) ×3 IMPLANT
ELECT REM PT RETURN 9FT ADLT (ELECTROSURGICAL) ×3
ELECTRODE REM PT RTRN 9FT ADLT (ELECTROSURGICAL) ×1 IMPLANT
GAUZE SPONGE 4X4 16PLY XRAY LF (GAUZE/BANDAGES/DRESSINGS) IMPLANT
GLOVE BIO SURGEON STRL SZ8 (GLOVE) ×3 IMPLANT
GLOVE BIOGEL M 8.0 STRL (GLOVE) ×3 IMPLANT
GLOVE BIOGEL PI IND STRL 8.5 (GLOVE) ×1 IMPLANT
GLOVE BIOGEL PI INDICATOR 8.5 (GLOVE) ×2
GLOVE OPTIFIT SS 6.5 STRL BRWN (GLOVE) ×9 IMPLANT
GOWN BRE IMP SLV AUR LG STRL (GOWN DISPOSABLE) IMPLANT
GOWN BRE IMP SLV AUR XL STRL (GOWN DISPOSABLE) IMPLANT
GOWN STRL REIN 2XL LVL4 (GOWN DISPOSABLE) IMPLANT
GOWN STRL REUS W/ TWL XL LVL3 (GOWN DISPOSABLE) ×2 IMPLANT
GOWN STRL REUS W/TWL MED LVL3 (GOWN DISPOSABLE) ×6 IMPLANT
GOWN STRL REUS W/TWL XL LVL3 (GOWN DISPOSABLE) ×4
GRAFT BN 5X1XSPNE CVD POST DBM (Bone Implant) ×1 IMPLANT
GRAFT BONE MAGNIFUSE 1X5CM (Bone Implant) ×2 IMPLANT
HEMOSTAT POWDER KIT SURGIFOAM (HEMOSTASIS) IMPLANT
KIT BASIN OR (CUSTOM PROCEDURE TRAY) ×3 IMPLANT
KIT ROOM TURNOVER OR (KITS) ×3 IMPLANT
NEEDLE HYPO 25X1 1.5 SAFETY (NEEDLE) ×3 IMPLANT
NEEDLE SPNL 20GX3.5 QUINCKE YW (NEEDLE) IMPLANT
NS IRRIG 1000ML POUR BTL (IV SOLUTION) ×3 IMPLANT
PACK LAMINECTOMY NEURO (CUSTOM PROCEDURE TRAY) ×3 IMPLANT
PAD ARMBOARD 7.5X6 YLW CONV (MISCELLANEOUS) ×9 IMPLANT
RUBBERBAND STERILE (MISCELLANEOUS) ×6 IMPLANT
SPONGE SURGIFOAM ABS GEL SZ50 (HEMOSTASIS) ×3 IMPLANT
STRIP CLOSURE SKIN 1/2X4 (GAUZE/BANDAGES/DRESSINGS) ×2 IMPLANT
SUT VIC AB 0 CT1 18XCR BRD8 (SUTURE) ×2 IMPLANT
SUT VIC AB 0 CT1 8-18 (SUTURE) ×4
SUT VIC AB 2-0 CP2 18 (SUTURE) ×6 IMPLANT
SUT VIC AB 3-0 SH 8-18 (SUTURE) ×6 IMPLANT
SYR 20ML ECCENTRIC (SYRINGE) ×3 IMPLANT
TAPE STRIPS DRAPE STRL (GAUZE/BANDAGES/DRESSINGS) ×3 IMPLANT
TOWEL OR 17X24 6PK STRL BLUE (TOWEL DISPOSABLE) ×3 IMPLANT
TOWEL OR 17X26 10 PK STRL BLUE (TOWEL DISPOSABLE) ×3 IMPLANT
WATER STERILE IRR 1000ML POUR (IV SOLUTION) ×3 IMPLANT

## 2013-03-14 NOTE — Transfer of Care (Signed)
Immediate Anesthesia Transfer of Care Note  Patient: Bruce Olson  Procedure(s) Performed: Procedure(s): HARDWARE REMOVAL (N/A)  Patient Location: PACU  Anesthesia Type:General  Level of Consciousness: awake, alert  and oriented  Airway & Oxygen Therapy: Patient Spontanous Breathing and Patient connected to nasal cannula oxygen  Post-op Assessment: Report given to PACU RN, Post -op Vital signs reviewed and stable and Patient moving all extremities X 4  Post vital signs: Reviewed and stable  Complications: No apparent anesthesia complications

## 2013-03-14 NOTE — Op Note (Signed)
03/14/2013  12:34 PM  PATIENT:  Bruce Olson  58 y.o. male  PRE-OPERATIVE DIAGNOSIS:  1. Painful and loosened hardware L2 status post L2-S1 fusion, 2. 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 Lumbar reexploration with exploration of fusion L2-3 L3-4, 2. Removal of segmental hardware L2-S1, 3. Posterior lateral fusion L2-3 utilizing morcellized allograft  SURGEON:  Sherley Bounds, MD  ASSISTANTS: Dr. Vertell Limber  ANESTHESIA:   General  EBL: 150 ml  Total I/O In: 1000 [I.V.:1000] Out: 150 [Blood:150]  BLOOD ADMINISTERED:none  DRAINS: Medium Hemovac   SPECIMEN:  No Specimen  INDICATION FOR PROCEDURE: This patient underwent a previous L2-S1 fusion. He presented with back pain without leg pain. CT myelogram showed lucency around the screws at L2 with solid fusions at L4-5 and L5-S1 and potentially solid fusion at L3-4. The lumbar reexploration with exploration of fusion removal of the loosened hardware he failed medical management. Patient understood the risks, benefits, and alternatives and potential outcomes and wished to proceed.  PROCEDURE DETAILS: The patient was taken to the operating room and after induction of adequate generalized endotracheal anesthesia he was rolled into the prone position onto chest rolls and all pressure points were padded. His lumbar region was cleaned and prepped with DuraPrep and draped in the usual sterile fashion 8 cc of local anesthesia was injected in his old incision was opened and carried down to the lumbosacral fascia. The fascia was opened and the paraspinous musculature was taken down in a subperiosteal fashion to expose the old hardware from L2-S1 bilaterally. I removed the cross-link and then removed the locking caps from the tulip heads. I then loosened and removed the rod from the tulip heads. I then removed the loose L2 screws. Also remove the S1 screws. I left the old L3 L4 and L5 screws in place in case  he ever  needed a adjacent level fusion and extension of hardware. Obviously make distal hardware placement very easy and I did not feel that leaving the screws in place disconnected from any further hardware would cause any potential pain for the patient. I explored the fusion at L2-3 and L3-4. I thought he was solid at L3-4 but was loose at L2-3. This would make sense loosening of the L2 pedicle screws. L3 pedicle screws were tight. Therefore decorticated the transverse processes of L2 and L3 bilaterally and placed it morcellized allograft out over these to hope for intertransverse arthrodesis. The wound was copiously irrigated with saline solution containing bacitracin. Bleeding points were coagulated. A medium Hemovac drain was placed. Muscle and the fascia was closed with 0 Vicryl. The subcutaneous was closed with 2-0 Vicryl and subcuticular tissues closed with 3-0 Vicryl. The skin was closed with benzoin Steri-Strips. Sterile dressing was applied. The drapes were removed. The patient was awakened and taken to the recovery room in stable condition. At the end of the procedure all sponge needle and instrument counts are correct.  PLAN OF CARE: Admit for overnight observation  PATIENT DISPOSITION:  PACU - hemodynamically stable.   Delay start of Pharmacological VTE agent (>24hrs) due to surgical blood loss or risk of bleeding:  yes

## 2013-03-14 NOTE — Anesthesia Procedure Notes (Signed)
Procedure Name: Intubation Date/Time: 03/14/2013 11:05 AM Performed by: Rush Farmer E Pre-anesthesia Checklist: Patient identified, Emergency Drugs available, Suction available, Patient being monitored and Timeout performed Patient Re-evaluated:Patient Re-evaluated prior to inductionOxygen Delivery Method: Circle system utilized Preoxygenation: Pre-oxygenation with 100% oxygen Intubation Type: IV induction Ventilation: Mask ventilation without difficulty Laryngoscope Size: Mac and 3 Grade View: Grade I Tube type: Oral Tube size: 7.5 mm Number of attempts: 1 Airway Equipment and Method: Stylet Placement Confirmation: ETT inserted through vocal cords under direct vision,  positive ETCO2 and breath sounds checked- equal and bilateral Secured at: 21 cm Tube secured with: Tape Dental Injury: Teeth and Oropharynx as per pre-operative assessment

## 2013-03-14 NOTE — H&P (Signed)
Subjective: Patient is a 58 y.o. male admitted for  Extraction of painful hardware. Onset of symptoms was 2 years ago, gradually worsening since that time.  The pain is rated severe, and is located at the across the lower back . The pain is described as aching and occurs intermittently. The symptoms have been progressive. Symptoms are exacerbated by exercise. MRI or CT showed lumbar fusion with loosening of the L2 pedicle screws.   Past Medical History  Diagnosis Date  . No pertinent past medical history   . Occupational injury 2013    resulted in back injury that led to surgery    Past Surgical History  Procedure Laterality Date  . Back surgery  2013    Back surgery  (fusion)09/15/2010 & 2013    Prior to Admission medications   Medication Sig Start Date End Date Taking? Authorizing Provider  ibuprofen (ADVIL,MOTRIN) 200 MG tablet Take 400 mg by mouth every 6 (six) hours as needed.   Yes Historical Provider, MD  methocarbamol (ROBAXIN) 500 MG tablet Take 1 tablet (500 mg total) by mouth 3 (three) times daily. 01/15/13  Yes Charlett Blake, MD  Multiple Vitamin (MULITIVITAMIN WITH MINERALS) TABS Take 1 tablet by mouth daily.   Yes Historical Provider, MD  oxyCODONE-acetaminophen (PERCOCET) 7.5-325 MG per tablet Take 1 tablet by mouth every 4 (four) hours as needed for pain. 02/07/13  Yes Charlett Blake, MD  oxymorphone (OPANA ER) 20 MG 12 hr tablet Take 1 tablet (20 mg total) by mouth every 12 (twelve) hours. 02/07/13  Yes Charlett Blake, MD  pregabalin (LYRICA) 100 MG capsule Take 100 mg by mouth 3 (three) times daily.   Yes Historical Provider, MD  docusate sodium (COLACE) 100 MG capsule Take 100 mg by mouth daily.    Historical Provider, MD   No Known Allergies  History  Substance Use Topics  . Smoking status: Current Every Day Smoker -- 1.50 packs/day for 33 years    Types: Cigarettes  . Smokeless tobacco: Never Used  . Alcohol Use: Yes     Comment: beer , q couple of months     Family History  Problem Relation Age of Onset  . Anesthesia problems Neg Hx   . Hypotension Neg Hx   . Malignant hyperthermia Neg Hx   . Pseudochol deficiency Neg Hx      Review of Systems  Positive ROS: neg  All other systems have been reviewed and were otherwise negative with the exception of those mentioned in the HPI and as above.  Objective: Vital signs in last 24 hours: Temp:  [97.7 F (36.5 C)] 97.7 F (36.5 C) (02/12 0920) Pulse Rate:  [63] 63 (02/12 0920) Resp:  [18] 18 (02/12 0920) BP: (150)/(62) 150/62 mmHg (02/12 0920) SpO2:  [100 %] 100 % (02/12 0920)  General Appearance: Alert, cooperative, no distress, appears stated age Head: Normocephalic, without obvious abnormality, atraumatic Eyes: PERRL, conjunctiva/corneas clear, EOM's intact    Neck: Supple, symmetrical, trachea midline Back: Symmetric, no curvature, ROM normal, no CVA tenderness Lungs:  respirations unlabored Heart: Regular rate and rhythm Abdomen: Soft, non-tender Extremities: Extremities normal, atraumatic, no cyanosis or edema Pulses: 2+ and symmetric all extremities Skin: Skin color, texture, turgor normal, no rashes or lesions  NEUROLOGIC:   Mental status: Alert and oriented x4,  no aphasia, good attention span, fund of knowledge, and memory Motor Exam - grossly normal Sensory Exam - grossly normal Reflexes: 1+ Coordination - grossly normal Gait - grossly normal Balance -  grossly normal Cranial Nerves: I: smell Not tested  II: visual acuity  OS: nl    OD: nl  II: visual fields Full to confrontation  II: pupils Equal, round, reactive to light  III,VII: ptosis None  III,IV,VI: extraocular muscles  Full ROM  V: mastication Normal  V: facial light touch sensation  Normal  V,VII: corneal reflex  Present  VII: facial muscle function - upper  Normal  VII: facial muscle function - lower Normal  VIII: hearing Not tested  IX: soft palate elevation  Normal  IX,X: gag reflex Present   XI: trapezius strength  5/5  XI: sternocleidomastoid strength 5/5  XI: neck flexion strength  5/5  XII: tongue strength  Normal    Data Review Lab Results  Component Value Date   WBC 6.9 03/11/2013   HGB 13.8 03/11/2013   HCT 39.9 03/11/2013   MCV 90.9 03/11/2013   PLT 250 03/11/2013   Lab Results  Component Value Date   NA 142 03/11/2013   K 4.3 03/11/2013   CL 103 03/11/2013   CO2 27 03/11/2013   BUN 9 03/11/2013   CREATININE 0.77 03/11/2013   GLUCOSE 81 03/11/2013   Lab Results  Component Value Date   INR 0.91 03/11/2013    Assessment/Plan: Patient admitted for extraction of painful hardware. Patient has failed a reasonable attempt at conservative therapy.  I explained the condition and procedure to the patient and answered any questions.  Patient wishes to proceed with procedure as planned. Understands risks/ benefits and typical outcomes of procedure.   Jolynn Bajorek S 03/14/2013 10:33 AM

## 2013-03-14 NOTE — Anesthesia Preprocedure Evaluation (Addendum)
Anesthesia Evaluation  Patient identified by MRN, date of birth, ID band Patient awake    Reviewed: Allergy & Precautions, H&P , NPO status   Airway Mallampati: II TM Distance: >3 FB Neck ROM: Full    Dental  (+) Poor Dentition, Missing   Pulmonary Current Smoker,          Cardiovascular     Neuro/Psych    GI/Hepatic   Endo/Other    Renal/GU      Musculoskeletal   Abdominal   Peds  Hematology   Anesthesia Other Findings   Reproductive/Obstetrics                        Anesthesia Physical Anesthesia Plan  ASA: II  Anesthesia Plan: General   Post-op Pain Management:    Induction: Intravenous  Airway Management Planned: Oral ETT  Additional Equipment:   Intra-op Plan:   Post-operative Plan: Extubation in OR  Informed Consent: I have reviewed the patients History and Physical, chart, labs and discussed the procedure including the risks, benefits and alternatives for the proposed anesthesia with the patient or authorized representative who has indicated his/her understanding and acceptance.   Dental advisory given  Plan Discussed with: CRNA and Surgeon  Anesthesia Plan Comments:        Anesthesia Quick Evaluation

## 2013-03-14 NOTE — Anesthesia Postprocedure Evaluation (Signed)
Anesthesia Post Note  Patient: Bruce Olson  Procedure(s) Performed: Procedure(s) (LRB): HARDWARE REMOVAL (N/A)  Anesthesia type: general  Patient location: PACU  Post pain: Pain level controlled  Post assessment: Patient's Cardiovascular Status Stable  Last Vitals:  Filed Vitals:   03/14/13 1315  BP: 127/68  Pulse: 77  Temp:   Resp: 17    Post vital signs: Reviewed and stable  Level of consciousness: sedated  Complications: No apparent anesthesia complications

## 2013-03-14 NOTE — Preoperative (Signed)
Beta Blockers   Reason not to administer Beta Blockers:Not Applicable 

## 2013-03-15 ENCOUNTER — Encounter (HOSPITAL_COMMUNITY): Payer: Self-pay | Admitting: Neurological Surgery

## 2013-03-15 DIAGNOSIS — T8489XA Other specified complication of internal orthopedic prosthetic devices, implants and grafts, initial encounter: Secondary | ICD-10-CM | POA: Diagnosis not present

## 2013-03-15 MED ORDER — METHOCARBAMOL 500 MG PO TABS: 500.0000 mg | ORAL_TABLET | Freq: Three times a day (TID) | ORAL | Status: DC

## 2013-03-15 MED ORDER — OXYMORPHONE HCL ER 20 MG PO TB12: 20.0000 mg | ORAL_TABLET | Freq: Two times a day (BID) | ORAL | Status: DC

## 2013-03-15 MED ORDER — OXYCODONE-ACETAMINOPHEN 7.5-325 MG PO TABS: 1.0000 | ORAL_TABLET | ORAL | Status: DC | PRN

## 2013-03-15 MED ORDER — KETOROLAC TROMETHAMINE 15 MG/ML IJ SOLN
15.0000 mg | Freq: Once | INTRAMUSCULAR | Status: AC
  Administered 2013-03-15: 15 mg via INTRAVENOUS
  Filled 2013-03-15: qty 1

## 2013-03-15 NOTE — Discharge Summary (Signed)
Physician Discharge Summary  Patient ID: Bruce Olson MRN: [id number] DOB/AGE: 1955/06/23 58 y.o.  Admit date: 03/14/2013 Discharge date: 03/15/2013  Admission Diagnoses: Loosened and painful hardware    Discharge Diagnoses: Same   Discharged Condition: good  Hospital Course: The patient was admitted on 03/14/2013 and taken to the operating room where the patient underwent removal of lumbar hardware. The patient tolerated the procedure well and was taken to the recovery room and then to the floor in stable condition. The hospital course was routine. There were no complications. The wound remained clean dry and intact. Pt had appropriate back soreness. No complaints of leg pain or new N/T/W. The patient remained afebrile with stable vital signs, and tolerated a regular diet. The patient continued to increase activities, and pain was well controlled with oral pain medications.   Consults: None  Significant Diagnostic Studies:  Results for orders placed during the hospital encounter of 03/11/13  SURGICAL PCR SCREEN      Result Value Ref Range   MRSA, PCR NEGATIVE  NEGATIVE   Staphylococcus aureus NEGATIVE  NEGATIVE  BASIC METABOLIC PANEL      Result Value Ref Range   Sodium 142  137 - 147 mEq/L   Potassium 4.3  3.7 - 5.3 mEq/L   Chloride 103  96 - 112 mEq/L   CO2 27  19 - 32 mEq/L   Glucose, Bld 81  70 - 99 mg/dL   BUN 9  6 - 23 mg/dL   Creatinine, Ser 0.77  0.50 - 1.35 mg/dL   Calcium 9.2  8.4 - 10.5 mg/dL   GFR calc non Af Amer >90  >90 mL/min   GFR calc Af Amer >90  >90 mL/min  CBC WITH DIFFERENTIAL      Result Value Ref Range   WBC 6.9  4.0 - 10.5 K/uL   RBC 4.39  4.22 - 5.81 MIL/uL   Hemoglobin 13.8  13.0 - 17.0 g/dL   HCT 39.9  39.0 - 52.0 %   MCV 90.9  78.0 - 100.0 fL   MCH 31.4  26.0 - 34.0 pg   MCHC 34.6  30.0 - 36.0 g/dL   RDW 12.5  11.5 - 15.5 %   Platelets 250  150 - 400 K/uL   Neutrophils Relative % 55  43 - 77 %   Neutro Abs 3.8  1.7 - 7.7 K/uL   Lymphocytes Relative 34  12 - 46 %   Lymphs Abs 2.4  0.7 - 4.0 K/uL   Monocytes Relative 9  3 - 12 %   Monocytes Absolute 0.6  0.1 - 1.0 K/uL   Eosinophils Relative 2  0 - 5 %   Eosinophils Absolute 0.2  0.0 - 0.7 K/uL   Basophils Relative 0  0 - 1 %   Basophils Absolute 0.0  0.0 - 0.1 K/uL  PROTIME-INR      Result Value Ref Range   Prothrombin Time 12.1  11.6 - 15.2 seconds   INR 0.91  0.00 - 1.49    Chest 2 View  03/11/2013   CLINICAL DATA:  Preoperative respiratory evaluation prior to lumbar spine surgery.  EXAM: CHEST  2 VIEW  COMPARISON:  09/13/2010.  FINDINGS: Cardiomediastinal silhouette unremarkable and unchanged. Hyperinflation. Mildly prominent bronchovascular markings diffusely and mild central peribronchial thickening, unchanged. Lungs clear. Bronchovascular markings normal. Pulmonary vascularity normal. No visible pleural effusions. No pneumothorax. Mild degenerative changes involving the thoracic spine. Prior lumbar fusion.  IMPRESSION: COPD/emphysema. No acute cardiopulmonary disease.  Stable examination.   Electronically Signed   By: Evangeline Dakin M.D.   On: 03/11/2013 16:02    Antibiotics:  Anti-infectives   Start     Dose/Rate Route Frequency Ordered Stop   03/14/13 1700  ceFAZolin (ANCEF) IVPB 1 g/50 mL premix     1 g 100 mL/hr over 30 Minutes Intravenous Every 8 hours 03/14/13 1542 03/15/13 0031   03/14/13 1055  bacitracin 50,000 Units in sodium chloride irrigation 0.9 % 500 mL irrigation  Status:  Discontinued       As needed 03/14/13 1136 03/14/13 1235   03/14/13 0600  ceFAZolin (ANCEF) IVPB 2 g/50 mL premix     2 g 100 mL/hr over 30 Minutes Intravenous On call to O.R. 03/13/13 1418 03/14/13 1110      Discharge Exam: Blood pressure 93/55, pulse 93, temperature 97.9 F (36.6 C), temperature source Oral, resp. rate 16, SpO2 94.00%. Neurologic: Grossly normal Incision clean and intact  Discharge Medications:     Medication List         docusate sodium  100 MG capsule  Commonly known as:  COLACE  Take 100 mg by mouth daily.     ibuprofen 200 MG tablet  Commonly known as:  ADVIL,MOTRIN  Take 400 mg by mouth every 6 (six) hours as needed.     methocarbamol 500 MG tablet  Commonly known as:  ROBAXIN  Take 1 tablet (500 mg total) by mouth 3 (three) times daily.     multivitamin with minerals Tabs tablet  Take 1 tablet by mouth daily.     oxyCODONE-acetaminophen 7.5-325 MG per tablet  Commonly known as:  PERCOCET  Take 1 tablet by mouth every 4 (four) hours as needed for pain.     oxymorphone 20 MG 12 hr tablet  Commonly known as:  OPANA ER  Take 1 tablet (20 mg total) by mouth every 12 (twelve) hours.     pregabalin 100 MG capsule  Commonly known as:  LYRICA  Take 100 mg by mouth 3 (three) times daily.        Disposition: Home   Final Dx: Extraction of lumbar hardware      Discharge Orders   Future Orders Complete By Expires   Call MD for:  difficulty breathing, headache or visual disturbances  As directed    Call MD for:  persistant nausea and vomiting  As directed    Call MD for:  redness, tenderness, or signs of infection (pain, swelling, redness, odor or green/yellow discharge around incision site)  As directed    Call MD for:  severe uncontrolled pain  As directed    Call MD for:  temperature >100.4  As directed    Diet - low sodium heart healthy  As directed    Discharge instructions  As directed    Comments:     No strenuous activity, no driving, no heavy lifting   Increase activity slowly  As directed       Follow-up Information   Follow up with JONES,DAVID S, MD. Schedule an appointment as soon as possible for a visit in 2 weeks.   Specialty:  Neurosurgery   Contact information:   [address] [place] [phone]        Signed: Eustace Moore 03/15/2013, 7:59 AM

## 2013-03-15 NOTE — Progress Notes (Signed)
Pt. discharged home accompanied by family. Prescriptions and discharge instructions given with verbalization of understanding. Incision site on neck with no s/s of infection - no swelling, redness, bleeding, and/or drainage noted. Soft collar intact. Pain med given just before leaving. Opportunity given to ask questions but no question asked. Pt. transported out of this unit in wheelchair by the nurse tech.

## 2013-03-21 IMAGING — RF DG LUMBAR SPINE COMPLETE 4+V
1 series · 4 of 4 positions shown · non-contrast
Comparison: 02/22/2011

CLINICAL DATA: L2-L4 extension of hardware.

LUMBAR SPINE - COMPLETE 4+ VIEW

[Series 1: run · 4 of 4 slices shown]
[im 1/4]
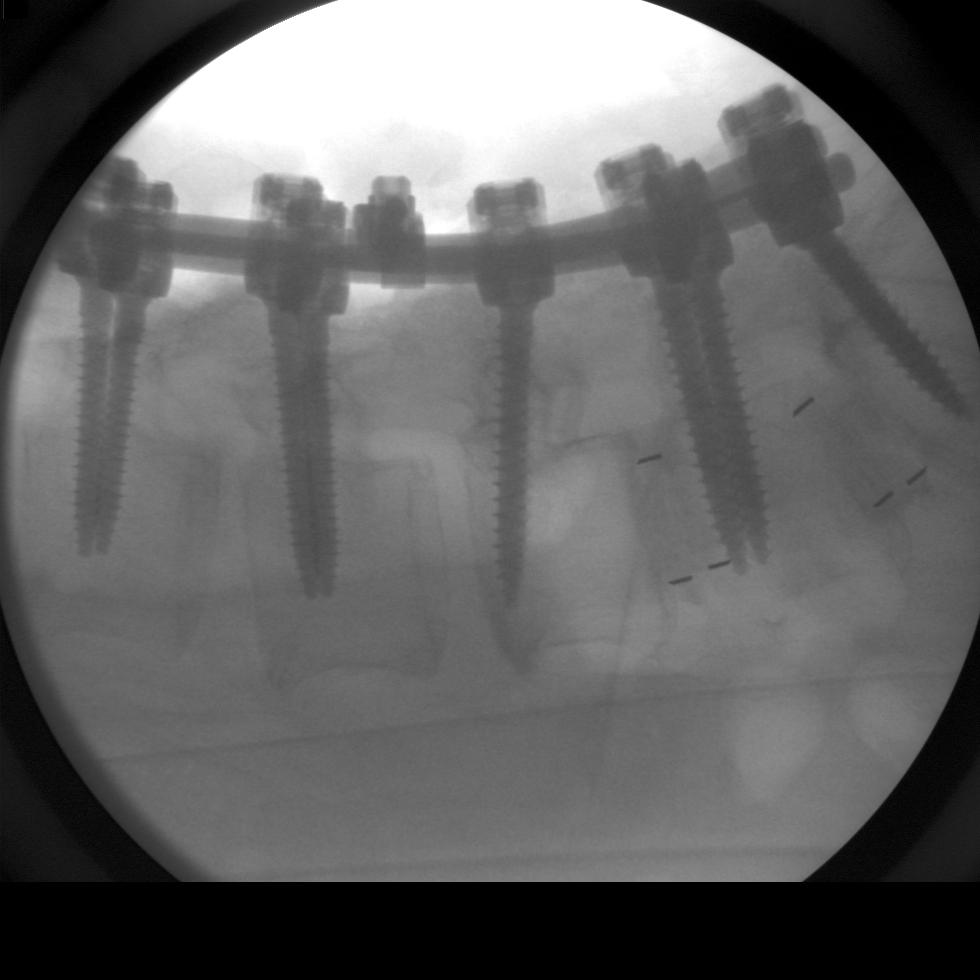
[im 2/4]
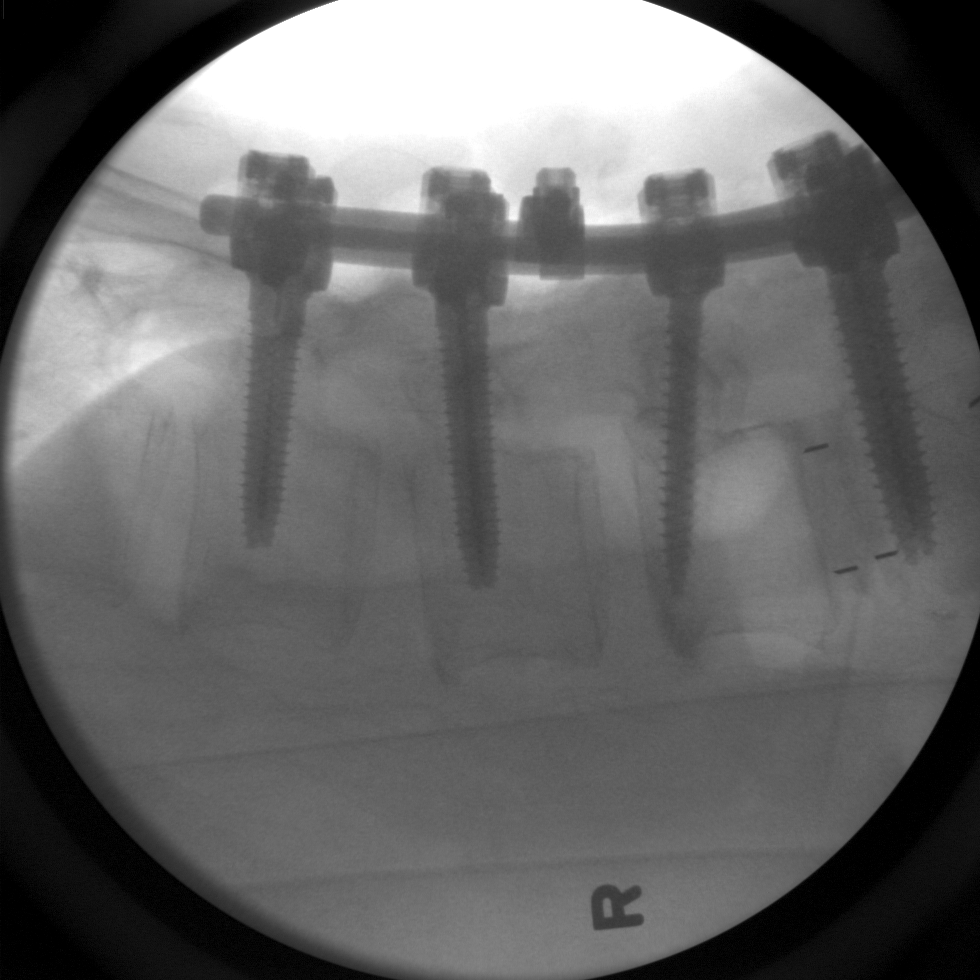
[im 3/4]
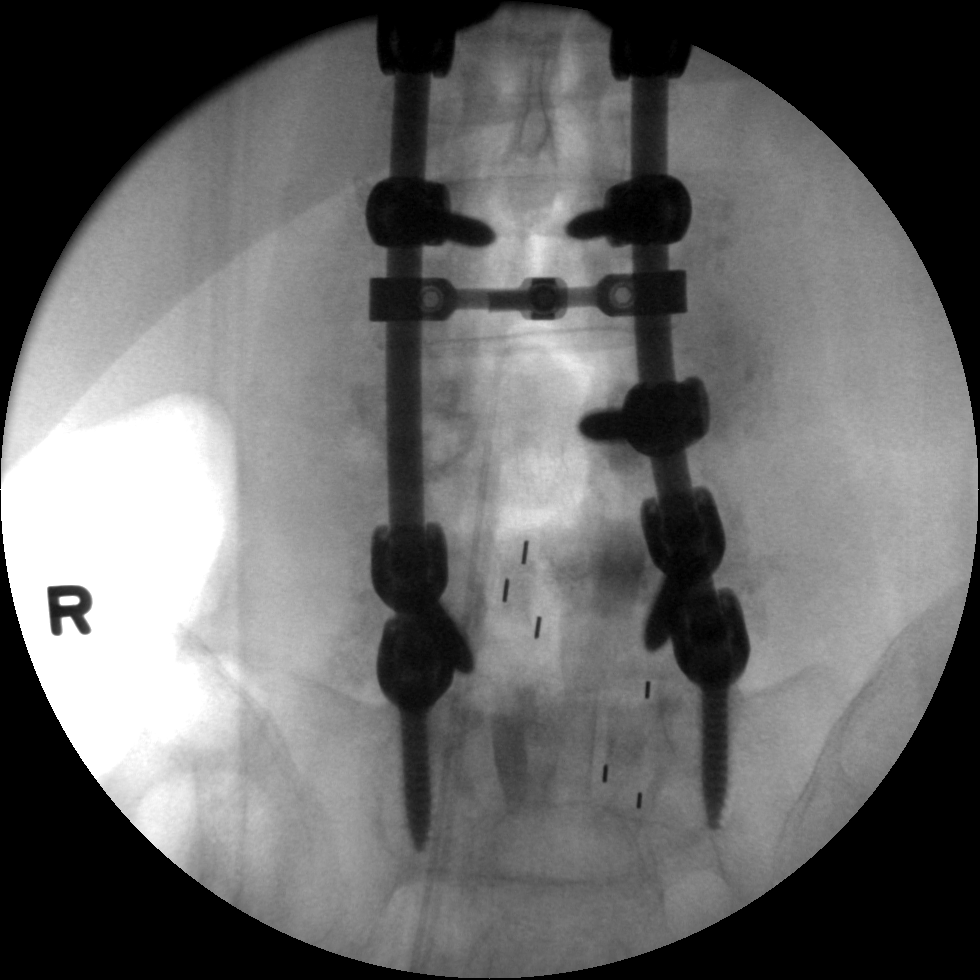
[im 4/4]
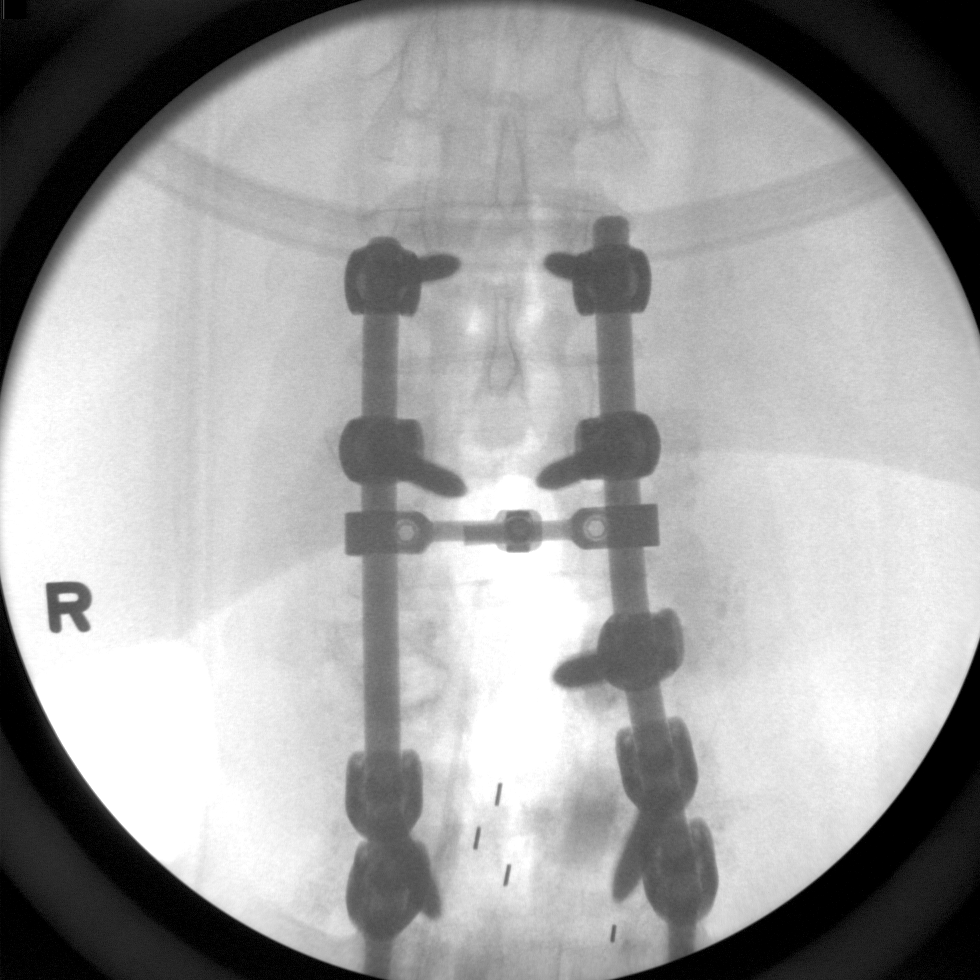

[4 of 4 positions shown; findings below may reference images not displayed]

FINDINGS: Previous studies showed pedicle screw fusion at L4, L5,
and S1.  This has been extended and now includes L2 and L3.  There
are bilateral pedicle screws at L2, L3, L4, L5, and S1.  Single
screw on the left at L3.  Interbody spacers at L4-5 and L5-S1.  No
acute complication.
IMPRESSION: Pedicle screw and rod fusion L2-S1.

## 2013-05-10 ENCOUNTER — Encounter: Payer: Self-pay | Attending: Physical Medicine and Rehabilitation

## 2013-05-10 ENCOUNTER — Ambulatory Visit (HOSPITAL_BASED_OUTPATIENT_CLINIC_OR_DEPARTMENT_OTHER): Payer: Medicare Other | Admitting: Physical Medicine & Rehabilitation

## 2013-05-10 ENCOUNTER — Encounter: Payer: Self-pay | Admitting: Physical Medicine & Rehabilitation

## 2013-05-10 VITALS — BP 117/62 | HR 62 | Resp 16 | Ht 66.0 in | Wt 128.0 lb

## 2013-05-10 DIAGNOSIS — Z79899 Other long term (current) drug therapy: Secondary | ICD-10-CM | POA: Insufficient documentation

## 2013-05-10 DIAGNOSIS — M961 Postlaminectomy syndrome, not elsewhere classified: Secondary | ICD-10-CM | POA: Insufficient documentation

## 2013-05-10 DIAGNOSIS — M5416 Radiculopathy, lumbar region: Secondary | ICD-10-CM

## 2013-05-10 DIAGNOSIS — Z981 Arthrodesis status: Secondary | ICD-10-CM | POA: Insufficient documentation

## 2013-05-10 DIAGNOSIS — Z9889 Other specified postprocedural states: Secondary | ICD-10-CM

## 2013-05-10 DIAGNOSIS — IMO0002 Reserved for concepts with insufficient information to code with codable children: Secondary | ICD-10-CM

## 2013-05-10 MED ORDER — OXYCODONE-ACETAMINOPHEN 7.5-325 MG PO TABS: 1.0000 | ORAL_TABLET | ORAL | Status: DC | PRN

## 2013-05-10 MED ORDER — PREGABALIN 100 MG PO CAPS: 100.0000 mg | ORAL_CAPSULE | Freq: Three times a day (TID) | ORAL | Status: DC

## 2013-05-10 MED ORDER — OXYMORPHONE HCL ER 20 MG PO TB12: 20.0000 mg | ORAL_TABLET | Freq: Two times a day (BID) | ORAL | Status: DC

## 2013-05-10 NOTE — Progress Notes (Signed)
Subjective:    Patient ID: Bruce Olson, male    DOB: [birth year], 58 y.o.   MRN: [id number] Date of injury was in 2002 moving a [employer] machine at work. Then follow her and had immediate onset of pain.  Underwent L4-L5 and L5-S1 fusion in 2012. Had a superior endplate fracture L4 and head additional surgery which involve decompressive lumbar laminectomy L3-L4 and posterior fixation L2-S1 as well as bone graft at L3-L4 performed by Dr. Ronnald Ramp 04/29/2011.  HPI Interval medical history Repeat myelogram in December of 2014 as below  Patient states he had black and blue area right third toe after his surgery. He does not remember any trauma. Notes some swelling in the legs as well 03/14/2013  PRE-OPERATIVE DIAGNOSIS: 1. Painful and loosened hardware L2 status post L2-S1 fusion, 2. Possible pseudoarthrosis L2-3 L3-4  POST-OPERATIVE DIAGNOSIS: Same  PROCEDURE: 1. Lumbar reexploration with exploration of fusion L2-3 L3-4, 2. Removal of segmental hardware L2-S1, 3. Posterior lateral fusion L2-3 utilizing morcellized allograft  SURGEON: Sherley Bounds, MD  No postoperative complications   Pain Inventory Average Pain 8 Pain Right Now 8 My pain is sharp, stabbing and tingling  In the last 24 hours, has pain interfered with the following? General activity 8 Relation with others 8 Enjoyment of life 8 What TIME of day is your pain at its worst? constant Sleep (in general) Poor  Pain is worse with: walking, sitting and standing Pain improves with: medication and TENS Relief from Meds: 8  Mobility walk without assistance use a walker how many minutes can you walk? 1/4 mile ability to climb steps?  yes do you drive?  yes  Function disabled: date disabled 2012  Neuro/Psych weakness numbness tremor trouble walking  Prior Studies Any changes since last visit?  no FINDINGS:  LUMBAR MYELOGRAM FINDINGS:  Good as seen lumbar subarachnoid space. No fracture or disconnect of  the  pedicle screw or rod construct but lucencies surround both L2  screws. There is solid appearing interbody fusion L4-5 and L5-S1.  Retro post bone fragment superiorly at L4 indents the thecal sac but  does not cause nerve root compression. There is advanced posterior  element hypertrophy with facet and ligamentum flavum overgrowth at  L2-L3 which compresses the thecal sac particularly with patient  standing. There is trace retrolisthesis L2 on L3 without significant  movement between flexion and extension.  CT LUMBAR MYELOGRAM FINDINGS:  Conus minimally low L1-L2. Normal in size. No occult spinal  dysraphism. No worrisome osseous lesions. Minimal atheromatous  change of the iliac vessels.  The individual disc spaces were examined with axial images as  follows:  L1-L2: Minimal bulge. No impingement.  L2-L3: Trace 1-2 mm retrolisthesis. Mild to moderate stenosis  secondary to annular bulging, facet arthropathy, and ligamentum  flavum hypertrophy. Bilateral L3 nerve root effacement. Significant  loosening both L2 screws has progressed from February 2014, with  increased periscrew lucency.  L3-L4: Adequate posterior decompression. Retropulsed bone fragment  in the canal without complete healing. Right L4 screw removed. Left  L4 screw appears solid. No foraminal narrowing. No lateral recess  stenosis.  L4-L5: Solid interbody fusion. No residual stenosis or impingement.  L5-S1: Solid interbody fusion. No residual stenosis or impingement.  IMPRESSION:  LUMBAR MYELOGRAM IMPRESSION:  Moderate stenosis at L2-L3 which appears primarily due to posterior  element hypertrophy. No dynamic instability despite bilateral L2  screw loosening.  CT LUMBAR MYELOGRAM IMPRESSION:  Multifactorial stenosis at L2-L3 is primarily due to ligamentum  flavum  hypertrophy although mild annular bulging and facet  overgrowth is noted. There is bilateral L3 nerve root effacement.  Progression of L2 screw loosening  from February 2014.  Physicians involved in your care Any changes since last visit?  no   Family History  Problem Relation Age of Onset  . Anesthesia problems Neg Hx   . Hypotension Neg Hx   . Malignant hyperthermia Neg Hx   . Pseudochol deficiency Neg Hx    History   Social History  . Marital Status: Single    Spouse Name: N/A    Number of Children: N/A  . Years of Education: N/A   Social History Main Topics  . Smoking status: Current Every Day Smoker -- 1.50 packs/day for 33 years    Types: Cigarettes  . Smokeless tobacco: Never Used  . Alcohol Use: Yes     Comment: beer , q couple of months  . Drug Use: No  . Sexual Activity: No     Comment: back problems   Other Topics Concern  . None   Social History Narrative  . None   Past Surgical History  Procedure Laterality Date  . Back surgery  2013    Back surgery  (fusion)09/15/2010 & 2013  . Hardware removal N/A 03/14/2013    Procedure: HARDWARE REMOVAL;  Surgeon: Eustace Moore, MD;  Location: [place] [facility];  Service: Neurosurgery;  Laterality: N/A;  . Back surgery  03-2013   Past Medical History  Diagnosis Date  . No pertinent past medical history   . Occupational injury 2013    resulted in back injury that led to surgery   BP 117/62  Pulse 62  Resp 16  Ht 5\' 6"  (1.676 m)  Wt 128 lb (58.06 kg)  BMI 20.67 kg/m2  SpO2 96%  Opioid Risk Score:   Fall Risk Score:     Review of Systems  Musculoskeletal: Positive for gait problem.  Neurological: Positive for tremors, weakness and numbness.  All other systems reviewed and are negative.      Objective:   Physical Exam  Pedal and posterior tibial pulses are normal Skin color a shows good Right foot is warm Trace edema at the ankle  Lumbar incision well healed Range of motion extremity Ltd. 25% flexion extension lateral bending rotation      Assessment & Plan:  1. Lumbar postlaminectomy syndrome status post lumbar fusion x3 most recently with  hardware removal and bone allograft waist and L2-3 proximally 8 weeks postoperative Patient was sent here by neurosurgery to resume chronic pain medications. Patient will finish his current prescriptions written by neurosurgery and I will give him new prescriptions that he can start filling on 05/20/2013 Return to clinic in approximately 5 weeks with nurse practitioner  Opana ER 20 mg twice a day Oxycodone 7.51 tablet 3 times per day Lyrica 100 mg 3 times per day Will discontinue methocarbamol , not effective  Continue opioid monitoring program. This consists of regular clinic visits, examinations, urine drug screen, pill counts as well as use of [place] controlled substance reporting System.  2. Right toe likely head contusion no evidence of significant vascular issues may have some mild venous insufficiency recommend support hose knee-high if this progresses follow up with primary care physician

## 2013-06-10 ENCOUNTER — Encounter: Payer: Medicare Other | Attending: Physical Medicine and Rehabilitation | Admitting: Registered Nurse

## 2013-06-10 ENCOUNTER — Encounter: Payer: Self-pay | Admitting: Registered Nurse

## 2013-06-10 VITALS — BP 130/64 | HR 73 | Resp 20 | Ht 66.0 in | Wt 126.0 lb

## 2013-06-10 DIAGNOSIS — M961 Postlaminectomy syndrome, not elsewhere classified: Secondary | ICD-10-CM

## 2013-06-10 DIAGNOSIS — IMO0002 Reserved for concepts with insufficient information to code with codable children: Secondary | ICD-10-CM

## 2013-06-10 DIAGNOSIS — M5416 Radiculopathy, lumbar region: Secondary | ICD-10-CM

## 2013-06-10 DIAGNOSIS — Z9889 Other specified postprocedural states: Secondary | ICD-10-CM

## 2013-06-10 DIAGNOSIS — Z981 Arthrodesis status: Secondary | ICD-10-CM | POA: Insufficient documentation

## 2013-06-10 DIAGNOSIS — Z5181 Encounter for therapeutic drug level monitoring: Secondary | ICD-10-CM

## 2013-06-10 DIAGNOSIS — Z79899 Other long term (current) drug therapy: Secondary | ICD-10-CM | POA: Insufficient documentation

## 2013-06-10 MED ORDER — OXYCODONE-ACETAMINOPHEN 7.5-325 MG PO TABS: 1.0000 | ORAL_TABLET | ORAL | Status: DC | PRN

## 2013-06-10 MED ORDER — OXYMORPHONE HCL ER 20 MG PO TB12: 20.0000 mg | ORAL_TABLET | Freq: Two times a day (BID) | ORAL | Status: DC

## 2013-06-10 NOTE — Progress Notes (Signed)
Subjective:    Patient ID: Bruce Olson, male    DOB: 01-15-1956, 58 y.o.   MRN: [id number]  HPI: Bruce Olson is a 58 year old male who returns for follow up for chronic pain and medication refill. He had lumbar infusion x3. His most recent surgery was on 03/14/2013 he had hardware removal and bone allograft waist and L2-3. He says his pain is located mid-lower back. He rates his pain 8. He's wearing his back brace. His current exercise regime is walking and performing stretching exercises.  Pain Inventory Average Pain 8 Pain Right Now 8 My pain is sharp, stabbing and tingling  In the last 24 hours, has pain interfered with the following? General activity 8 Relation with others 8 Enjoyment of life 8 What TIME of day is your pain at its worst? constant Sleep (in general) Poor  Pain is worse with: walking, bending, sitting and standing Pain improves with: medication and TENS Relief from Meds: 8  Mobility walk without assistance use a walker how many minutes can you walk? 1/4 mile ability to climb steps?  yes do you drive?  yes  Function disabled: date disabled 05/2010  Neuro/Psych weakness numbness tremor trouble walking  Prior Studies Any changes since last visit?  no  Physicians involved in your care Any changes since last visit?  no   Family History  Problem Relation Age of Onset  . Anesthesia problems Neg Hx   . Hypotension Neg Hx   . Malignant hyperthermia Neg Hx   . Pseudochol deficiency Neg Hx    History   Social History  . Marital Status: Single    Spouse Name: N/A    Number of Children: N/A  . Years of Education: N/A   Social History Main Topics  . Smoking status: Current Every Day Smoker -- 1.50 packs/day for 33 years    Types: Cigarettes  . Smokeless tobacco: Never Used  . Alcohol Use: Yes     Comment: beer , q couple of months  . Drug Use: No  . Sexual Activity: No     Comment: back problems   Other Topics Concern  .  None   Social History Narrative  . None   Past Surgical History  Procedure Laterality Date  . Back surgery  2013    Back surgery  (fusion)09/15/2010 & 2013  . Hardware removal N/A 03/14/2013    Procedure: HARDWARE REMOVAL;  Surgeon: Eustace Moore, MD;  Location: [place] [facility];  Service: Neurosurgery;  Laterality: N/A;  . Back surgery  03-2013   Past Medical History  Diagnosis Date  . No pertinent past medical history   . Occupational injury 2013    resulted in back injury that led to surgery   BP 130/64  Pulse 73  Resp 20  Ht 5\' 6"  (1.676 m)  Wt 126 lb (57.153 kg)  BMI 20.35 kg/m2  SpO2 95%  Opioid Risk Score:   Fall Risk Score: Moderate Fall Risk (6-13 points) (patient educated handout declined)   Review of Systems  Musculoskeletal: Positive for back pain and gait problem.  Neurological: Positive for tremors, weakness and numbness.  All other systems reviewed and are negative.      Objective:   Physical Exam  Nursing note and vitals reviewed. Constitutional: He is oriented to person, place, and time. He appears well-developed and well-nourished.  HENT:  Head: Normocephalic and atraumatic.  Neck: Normal range of motion. Neck supple.  Cardiovascular: Normal rate, regular  rhythm and normal heart sounds.   Pulmonary/Chest: Effort normal and breath sounds normal.  Musculoskeletal:  Normal Muscle Bulk: Muscle Testing Reveals: Upper extremities: Full ROM Muscle Srength 4/5 Thoracic and Lumbar Paraspinal Tenderness Noted: T-10- L-5 Spine flexion 30 degrees Lower Extremities: Left leg Decreased ROM 30 degrees and Muscle Strength 3/5 Right Leg: Flexion produces pain to Lumbar Region, Decreased ROM 30 degrees. Arises from chair with ease. Narrow Based Gait  Neurological: He is alert and oriented to person, place, and time.  Skin: Skin is warm and dry.  Psychiatric: He has a normal mood and affect.          Assessment & Plan:  1. Lumbar postlaminectomy syndrome  status post lumbar fusion x3: His most recent surgery was 03/14/2013. He had hardware removal and bone allograft waist and L2-3. He has F/U appointment with Dr. Ronnald Ramp in June. Refilled: oxyCODONE 7.5/325mg  one tablet every 4 hours as needed #90 and OPANA 20 mg one tablet every 12 hours #60.  20 minutes of face to face patient care time was spent during this visit. All questions were encouraged and answered.  F/U in 1 month

## 2013-07-08 ENCOUNTER — Encounter: Payer: Medicare Other | Attending: Physical Medicine and Rehabilitation | Admitting: Registered Nurse

## 2013-07-08 ENCOUNTER — Encounter: Payer: Self-pay | Admitting: Registered Nurse

## 2013-07-08 VITALS — BP 126/66 | HR 69 | Resp 14 | Wt 125.8 lb

## 2013-07-08 DIAGNOSIS — M7061 Trochanteric bursitis, right hip: Secondary | ICD-10-CM

## 2013-07-08 DIAGNOSIS — M5416 Radiculopathy, lumbar region: Secondary | ICD-10-CM

## 2013-07-08 DIAGNOSIS — Z9889 Other specified postprocedural states: Secondary | ICD-10-CM

## 2013-07-08 DIAGNOSIS — M961 Postlaminectomy syndrome, not elsewhere classified: Secondary | ICD-10-CM | POA: Insufficient documentation

## 2013-07-08 DIAGNOSIS — Z5181 Encounter for therapeutic drug level monitoring: Secondary | ICD-10-CM

## 2013-07-08 DIAGNOSIS — Z79899 Other long term (current) drug therapy: Secondary | ICD-10-CM

## 2013-07-08 DIAGNOSIS — M76899 Other specified enthesopathies of unspecified lower limb, excluding foot: Secondary | ICD-10-CM

## 2013-07-08 DIAGNOSIS — M7062 Trochanteric bursitis, left hip: Secondary | ICD-10-CM

## 2013-07-08 DIAGNOSIS — Z981 Arthrodesis status: Secondary | ICD-10-CM | POA: Insufficient documentation

## 2013-07-08 DIAGNOSIS — IMO0002 Reserved for concepts with insufficient information to code with codable children: Secondary | ICD-10-CM

## 2013-07-08 MED ORDER — OXYCODONE-ACETAMINOPHEN 7.5-325 MG PO TABS: 1.0000 | ORAL_TABLET | ORAL | Status: DC | PRN

## 2013-07-08 MED ORDER — OXYMORPHONE HCL ER 20 MG PO TB12: 20.0000 mg | ORAL_TABLET | Freq: Two times a day (BID) | ORAL | Status: DC

## 2013-07-08 NOTE — Progress Notes (Signed)
Subjective:    Patient ID: Bruce Olson, male    DOB: 02/27/1955, 58 y.o.   MRN: [id number]  HPI: [name] is a 58 year old male who returns for follow up for chronic pain and medication refill. He had lumbar infusion x3. His most recent surgery was on 03/14/2013 he had hardware removal and bone allograft waist and L2-3.  He is scheduled to see Dr. Ronnald Ramp tomorrow for follow up appointment.He says his pain is located mid-lower back. He rates his pain 8. He's wearing his back brace. His current exercise regime is walking and performing stretching exercises. He also uses heat therapy and has a TENS Unit. He's complaining of insomnia he doesn't want to try trazodone he was on [place] last year it wasn't helpful. Spoke to him about melatonin and sleepy time tea. He verbalizes understanding.     Pain Inventory Average Pain 8 Pain Right Now 8 My pain is sharp, stabbing and tingling  In the last 24 hours, has pain interfered with the following? General activity 8 Relation with others 8 Enjoyment of life 8 What TIME of day is your pain at its worst? all Sleep (in general) Poor  Pain is worse with: walking, sitting and standing Pain improves with: medication and TENS Relief from Meds: 8  Mobility walk without assistance use a walker ability to climb steps?  yes do you drive?  yes  Function disabled: date disabled 2012  Neuro/Psych weakness numbness tremor trouble walking  Prior Studies Any changes since last visit?  no  Physicians involved in your care Any changes since last visit?  no   Family History  Problem Relation Age of Onset  . Anesthesia problems Neg Hx   . Hypotension Neg Hx   . Malignant hyperthermia Neg Hx   . Pseudochol deficiency Neg Hx    History   Social History  . Marital Status: Single    Spouse Name: N/A    Number of Children: N/A  . Years of Education: N/A   Social History Main Topics  . Smoking status: Current Every Day  Smoker -- 1.50 packs/day for 33 years    Types: Cigarettes  . Smokeless tobacco: Never Used  . Alcohol Use: Yes     Comment: beer , q couple of months  . Drug Use: No  . Sexual Activity: No     Comment: back problems   Other Topics Concern  . None   Social History Narrative  . None   Past Surgical History  Procedure Laterality Date  . Back surgery  2013    Back surgery  (fusion)09/15/2010 & 2013  . Hardware removal N/A 03/14/2013    Procedure: HARDWARE REMOVAL;  Surgeon: Eustace Moore, MD;  Location: [place] [facility];  Service: Neurosurgery;  Laterality: N/A;  . Back surgery  03-2013   Past Medical History  Diagnosis Date  . No pertinent past medical history   . Occupational injury 2013    resulted in back injury that led to surgery   BP 126/66  Pulse 69  Resp 14  Wt 125 lb 12.8 oz (57.063 kg)  SpO2 97%  Opioid Risk Score:   Fall Risk Score:  (educated previously and handout declined)  Review of Systems  Musculoskeletal: Positive for gait problem.  Neurological: Positive for tremors, weakness and numbness.  All other systems reviewed and are negative.      Objective:   Physical Exam  Nursing note and vitals reviewed. Constitutional: He  is oriented to person, place, and time. He appears well-developed and well-nourished.  HENT:  Head: Normocephalic and atraumatic.  Neck: Normal range of motion. Neck supple.  Cardiovascular: Normal rate, regular rhythm and normal heart sounds.   Pulmonary/Chest: Effort normal and breath sounds normal.  Musculoskeletal:  Normal Muscle Bulk and Muscle Testing Reveals: Upper Extremities: Full ROM and Muscle strength 5/5. Thoracic and Lumbar Hypersensitivity. Bilateral Greater Trochanteric Tenderness Noted Arises from Table with ease. Narrow Based gait.  Neurological: He is alert and oriented to person, place, and time.  Skin: Skin is warm and dry.  Psychiatric: He has a normal mood and affect.          Assessment & Plan:  1.  Lumbar postlaminectomy syndrome status post lumbar fusion x3: His most recent surgery was 03/14/2013. He had hardware removal and bone allograft waist and L2-3. He has F/U appointment with Dr. Ronnald Ramp in June.  Refilled: oxyCODONE 7.5/325mg  one tablet every 4 hours as needed #90 and OPANA 20 mg one tablet every 12 hours #60.  2. Greater Trochanteric Bursitis: Continue with heat and exercise regimen.   20 minutes of face to face patient care time was spent during this visit. All questions were encouraged and answered.  F/U in 1 month

## 2013-07-09 ENCOUNTER — Ambulatory Visit: Payer: Self-pay | Admitting: Registered Nurse

## 2013-08-07 ENCOUNTER — Encounter: Payer: Self-pay | Admitting: Registered Nurse

## 2013-08-07 ENCOUNTER — Encounter: Payer: Medicare Other | Attending: Physical Medicine and Rehabilitation | Admitting: Registered Nurse

## 2013-08-07 VITALS — BP 101/72 | HR 78 | Resp 14 | Ht 67.0 in | Wt 120.0 lb

## 2013-08-07 DIAGNOSIS — Z9889 Other specified postprocedural states: Secondary | ICD-10-CM

## 2013-08-07 DIAGNOSIS — M7061 Trochanteric bursitis, right hip: Secondary | ICD-10-CM

## 2013-08-07 DIAGNOSIS — Z79899 Other long term (current) drug therapy: Secondary | ICD-10-CM | POA: Diagnosis present

## 2013-08-07 DIAGNOSIS — Z5181 Encounter for therapeutic drug level monitoring: Secondary | ICD-10-CM | POA: Insufficient documentation

## 2013-08-07 DIAGNOSIS — M7062 Trochanteric bursitis, left hip: Secondary | ICD-10-CM

## 2013-08-07 DIAGNOSIS — M961 Postlaminectomy syndrome, not elsewhere classified: Secondary | ICD-10-CM | POA: Diagnosis present

## 2013-08-07 DIAGNOSIS — IMO0002 Reserved for concepts with insufficient information to code with codable children: Secondary | ICD-10-CM | POA: Diagnosis present

## 2013-08-07 DIAGNOSIS — M5416 Radiculopathy, lumbar region: Secondary | ICD-10-CM

## 2013-08-07 DIAGNOSIS — M76899 Other specified enthesopathies of unspecified lower limb, excluding foot: Secondary | ICD-10-CM | POA: Insufficient documentation

## 2013-08-07 MED ORDER — OXYCODONE-ACETAMINOPHEN 7.5-325 MG PO TABS: 1.0000 | ORAL_TABLET | ORAL | Status: DC | PRN

## 2013-08-07 MED ORDER — OXYMORPHONE HCL ER 20 MG PO TB12: 20.0000 mg | ORAL_TABLET | Freq: Two times a day (BID) | ORAL | Status: DC

## 2013-08-07 NOTE — Progress Notes (Signed)
Subjective:    Patient ID: Bruce Olson, male    DOB: 09-Dec-1955, 58 y.o.   MRN: [id number]  HPI: Bruce Olson is a 57 year old male who returns for follow up for chronic pain and medication refill. He had lumbar infusion x3. His most recent surgery was on 03/14/2013 he had hardware removal and bone allograft waist and L2-3. He says his pain is located mid-lower back and bilateral legs.He rates his pain 8. He's wearing his back brace. His current exercise regime is walking and performing stretching exercises. He also uses heat therapy and has a TENS Unit.  He says Dr. Ronnald Ramp told him he wasn't a surgical candidate if the time comes for more surgery.      Pain Inventory Average Pain 8 Pain Right Now 8 My pain is sharp, stabbing and tingling  In the last 24 hours, has pain interfered with the following? General activity 8 Relation with others 8 Enjoyment of life 8 What TIME of day is your pain at its worst? constant all day Sleep (in general) Poor  Pain is worse with: walking, sitting and standing Pain improves with: medication and TENS Relief from Meds: 8  Mobility walk with assistance use a walker how many minutes can you walk? 1/2 mile ability to climb steps?  yes do you drive?  yes transfers alone  Function disabled: date disabled 05/2010  Neuro/Psych weakness numbness tremor trouble walking  Prior Studies Any changes since last visit?  no  Physicians involved in your care Any changes since last visit?  no   Family History  Problem Relation Age of Onset  . Anesthesia problems Neg Hx   . Hypotension Neg Hx   . Malignant hyperthermia Neg Hx   . Pseudochol deficiency Neg Hx    History   Social History  . Marital Status: Single    Spouse Name: N/A    Number of Children: N/A  . Years of Education: N/A   Social History Main Topics  . Smoking status: Current Every Day Smoker -- 1.50 packs/day for 33 years    Types: Cigarettes  . Smokeless  tobacco: Never Used  . Alcohol Use: Yes     Comment: beer , q couple of months  . Drug Use: No  . Sexual Activity: No     Comment: back problems   Other Topics Concern  . None   Social History Narrative  . None   Past Surgical History  Procedure Laterality Date  . Back surgery  2013    Back surgery  (fusion)09/15/2010 & 2013  . Hardware removal N/A 03/14/2013    Procedure: HARDWARE REMOVAL;  Surgeon: Eustace Moore, MD;  Location: [place] [facility];  Service: Neurosurgery;  Laterality: N/A;  . Back surgery  03-2013   Past Medical History  Diagnosis Date  . No pertinent past medical history   . Occupational injury 2013    resulted in back injury that led to surgery   BP 101/72  Pulse 78  Resp 14  Ht 5\' 7"  (1.702 m)  Wt 120 lb (54.432 kg)  BMI 18.79 kg/m2  SpO2 94%  Opioid Risk Score:   Fall Risk Score: Moderate Fall Risk (6-13 points) (pt educated on fall risk, brochure given to pt previously)    Review of Systems  Musculoskeletal: Positive for back pain and gait problem.  Neurological: Positive for tremors, weakness and numbness.  All other systems reviewed and are negative.  Objective:   Physical Exam  Nursing note and vitals reviewed. Constitutional: He appears well-developed and well-nourished.  HENT:  Head: Normocephalic and atraumatic.  Neck: Normal range of motion. Neck supple.  Cardiovascular: Normal rate and regular rhythm.   Pulmonary/Chest: Effort normal and breath sounds normal.  Musculoskeletal:  Normal Muscle Bulk and Muscle testing Reveals: Upper extremities: Full ROM and Muscle strength 5/5 Thoracic and Lumbar Hypersensitivity Wearing Back Brace Lower Extremities: Full ROM and Muscle strength 5/5 Arises from chair with ease Narrow Based Gait  Skin: Skin is warm and dry.  Psychiatric: He has a normal mood and affect.          Assessment & Plan:  1. Lumbar postlaminectomy syndrome status post lumbar fusion x3: His most recent surgery  was 03/14/2013. He had hardware removal and bone allograft waist and L2-3. He has  Refilled: oxyCODONE 7.5/325mg  one tablet every 4 hours as needed #90 and OPANA 20 mg one tablet every 12 hours #60.  2. Greater Trochanteric Bursitis: Continue with heat and exercise regimen.   20 minutes of face to face patient care time was spent during this visit. All questions were encouraged and answered.   F/U in 1 month

## 2013-09-04 ENCOUNTER — Encounter: Payer: Medicare Other | Attending: Physical Medicine and Rehabilitation | Admitting: Registered Nurse

## 2013-09-04 ENCOUNTER — Encounter: Payer: Self-pay | Admitting: Registered Nurse

## 2013-09-04 VITALS — BP 129/69 | HR 75 | Resp 14 | Ht 66.0 in | Wt 123.0 lb

## 2013-09-04 DIAGNOSIS — IMO0002 Reserved for concepts with insufficient information to code with codable children: Secondary | ICD-10-CM | POA: Diagnosis present

## 2013-09-04 DIAGNOSIS — Z79899 Other long term (current) drug therapy: Secondary | ICD-10-CM | POA: Diagnosis present

## 2013-09-04 DIAGNOSIS — M5416 Radiculopathy, lumbar region: Secondary | ICD-10-CM

## 2013-09-04 DIAGNOSIS — M961 Postlaminectomy syndrome, not elsewhere classified: Secondary | ICD-10-CM | POA: Diagnosis present

## 2013-09-04 DIAGNOSIS — Z9889 Other specified postprocedural states: Secondary | ICD-10-CM

## 2013-09-04 DIAGNOSIS — M76899 Other specified enthesopathies of unspecified lower limb, excluding foot: Secondary | ICD-10-CM

## 2013-09-04 DIAGNOSIS — Z5181 Encounter for therapeutic drug level monitoring: Secondary | ICD-10-CM

## 2013-09-04 MED ORDER — OXYCODONE-ACETAMINOPHEN 7.5-325 MG PO TABS: 1.0000 | ORAL_TABLET | ORAL | Status: DC | PRN

## 2013-09-04 MED ORDER — OXYMORPHONE HCL ER 20 MG PO TB12: 20.0000 mg | ORAL_TABLET | Freq: Two times a day (BID) | ORAL | Status: DC

## 2013-09-04 NOTE — Progress Notes (Signed)
Subjective:    Patient ID: Bruce Olson, male    DOB: Jul 13, 1955, 58 y.o.   MRN: [id number]  HPI: Bruce Olson is a 58 year old male who returns for follow up for chronic pain and medication refill. Marland Kitchen He says his pain is located mid-lower back. He rates his pain 8. He's wearing his back brace. His current exercise regime is walking and performing stretching exercises. He was on vacation at the beach and walked in the ocean.  Pain Inventory Average Pain 8 Pain Right Now 8 My pain is sharp, stabbing and tingling  In the last 24 hours, has pain interfered with the following? General activity 8 Relation with others 8 Enjoyment of life 8 What TIME of day is your pain at its worst? constant all day Sleep (in general) Poor  Pain is worse with: walking, sitting and standing Pain improves with: medication and TENS Relief from Meds: 8  Mobility walk without assistance how many minutes can you walk? 1/2 mile ability to climb steps?  yes do you drive?  yes transfers alone  Function disabled: date disabled 05/2009  Neuro/Psych weakness numbness tremor trouble walking  Prior Studies Any changes since last visit?  no  Physicians involved in your care Any changes since last visit?  no   Family History  Problem Relation Age of Onset  . Anesthesia problems Neg Hx   . Hypotension Neg Hx   . Malignant hyperthermia Neg Hx   . Pseudochol deficiency Neg Hx    History   Social History  . Marital Status: Single    Spouse Name: N/A    Number of Children: N/A  . Years of Education: N/A   Social History Main Topics  . Smoking status: Current Every Day Smoker -- 1.50 packs/day for 33 years    Types: Cigarettes  . Smokeless tobacco: Never Used  . Alcohol Use: Yes     Comment: beer , q couple of months  . Drug Use: No  . Sexual Activity: No     Comment: back problems   Other Topics Concern  . None   Social History Narrative  . None   Past Surgical  History  Procedure Laterality Date  . Back surgery  2013    Back surgery  (fusion)09/15/2010 & 2013  . Hardware removal N/A 03/14/2013    Procedure: HARDWARE REMOVAL;  Surgeon: Eustace Moore, MD;  Location: [place] [facility];  Service: Neurosurgery;  Laterality: N/A;  . Back surgery  03-2013   Past Medical History  Diagnosis Date  . No pertinent past medical history   . Occupational injury 2013    resulted in back injury that led to surgery   BP 129/69  Pulse 75  Resp 14  Ht 5\' 6"  (1.676 m)  Wt 123 lb (55.792 kg)  BMI 19.86 kg/m2  SpO2 96%  Opioid Risk Score:   Fall Risk Score: Moderate Fall Risk (6-13 points) (pt educated on fall risk, brochure given to pt previously)    Review of Systems  Musculoskeletal: Positive for back pain and gait problem.  Neurological: Positive for tremors, weakness and numbness.  All other systems reviewed and are negative.      Objective:   Physical Exam  Nursing note and vitals reviewed. Constitutional: He is oriented to person, place, and time. He appears well-developed and well-nourished.  HENT:  Head: Normocephalic and atraumatic.  Neck: Normal range of motion. Neck supple.  Cardiovascular: Normal rate, regular rhythm and  normal heart sounds.   Pulmonary/Chest: Effort normal and breath sounds normal.  Musculoskeletal:  Normal Muscle Bulk and Muscle Testing reveals: Upper Extremities: Full ROM and Muscle Strength 5/5 Spinal Forward Flexion: 20 Degrees and Extension 10 Degrees Lumbar Paraspinal Tenderness Mainly Left Side Back Brace Intact Lower extremities: Full ROM and Muscle Strength 5/5 Arises from chair with ease  Neurological: He is alert and oriented to person, place, and time.  Skin: Skin is warm and dry.  Psychiatric: He has a normal mood and affect.          Assessment & Plan:  1. Lumbar postlaminectomy syndrome status post lumbar fusion x3: His most recent surgery was 03/14/2013. He had hardware removal and bone allograft  waist and L2-3.  Refilled: oxyCODONE 7.5/325mg  one tablet every 4 hours as needed #90 and OPANA 20 mg one tablet every 12 hours #60.  2. Greater Trochanteric Bursitis: No Complaints Voiced Today. Continue with heat and exercise regimen.   20 minutes of face to face patient care time was spent during this visit. All questions were encouraged and answered.   F/U in 1 month

## 2013-10-04 ENCOUNTER — Encounter: Payer: Medicare Other | Attending: Physical Medicine and Rehabilitation | Admitting: Registered Nurse

## 2013-10-04 ENCOUNTER — Encounter: Payer: Self-pay | Admitting: Registered Nurse

## 2013-10-04 VITALS — BP 113/66 | HR 74 | Resp 16 | Ht 66.0 in | Wt 121.0 lb

## 2013-10-04 DIAGNOSIS — Z5181 Encounter for therapeutic drug level monitoring: Secondary | ICD-10-CM | POA: Diagnosis present

## 2013-10-04 DIAGNOSIS — Z79899 Other long term (current) drug therapy: Secondary | ICD-10-CM | POA: Diagnosis present

## 2013-10-04 DIAGNOSIS — M961 Postlaminectomy syndrome, not elsewhere classified: Secondary | ICD-10-CM | POA: Insufficient documentation

## 2013-10-04 DIAGNOSIS — Z9889 Other specified postprocedural states: Secondary | ICD-10-CM

## 2013-10-04 DIAGNOSIS — IMO0002 Reserved for concepts with insufficient information to code with codable children: Secondary | ICD-10-CM | POA: Diagnosis present

## 2013-10-04 DIAGNOSIS — M5416 Radiculopathy, lumbar region: Secondary | ICD-10-CM

## 2013-10-04 DIAGNOSIS — M76899 Other specified enthesopathies of unspecified lower limb, excluding foot: Secondary | ICD-10-CM | POA: Diagnosis present

## 2013-10-04 MED ORDER — OXYCODONE-ACETAMINOPHEN 7.5-325 MG PO TABS: 1.0000 | ORAL_TABLET | ORAL | Status: DC | PRN

## 2013-10-04 MED ORDER — OXYMORPHONE HCL ER 20 MG PO TB12
20.0000 mg | ORAL_TABLET | Freq: Two times a day (BID) | ORAL | Status: DC
Start: 2013-10-04 — End: 2013-10-30

## 2013-10-04 NOTE — Progress Notes (Signed)
Subjective:    Patient ID: Bruce Olson, male    DOB: 03-05-1955, 58 y.o.   MRN: [id number]  HPI: [name] is a 58 year old male who returns for follow up for chronic pain and medication refill. Marland Kitchen He says his pain is located in his lower back. He rates his pain 8. He's wearing his back brace. His current exercise regime is walking 4 times a day.  Pain Inventory Average Pain 8 Pain Right Now 8 My pain is sharp, stabbing and tingling  In the last 24 hours, has pain interfered with the following? General activity 8 Relation with others 8 Enjoyment of life 8 What TIME of day is your pain at its worst? constant all day Sleep (in general) Poor  Pain is worse with: walking, sitting and standing Pain improves with: medication and TENS Relief from Meds: 8  Mobility walk without assistance walk with assistance use a walker how many minutes can you walk? 1/4 mile ability to climb steps?  yes do you drive?  yes  Function disabled: date disabled 05/2010  Neuro/Psych weakness numbness tremor trouble walking  Prior Studies Any changes since last visit?  no  Physicians involved in your care Any changes since last visit?  no   Family History  Problem Relation Age of Onset  . Anesthesia problems Neg Hx   . Hypotension Neg Hx   . Malignant hyperthermia Neg Hx   . Pseudochol deficiency Neg Hx    History   Social History  . Marital Status: Single    Spouse Name: N/A    Number of Children: N/A  . Years of Education: N/A   Social History Main Topics  . Smoking status: Current Every Day Smoker -- 1.50 packs/day for 33 years    Types: Cigarettes  . Smokeless tobacco: Never Used  . Alcohol Use: Yes     Comment: beer , q couple of months  . Drug Use: No  . Sexual Activity: No     Comment: back problems   Other Topics Concern  . None   Social History Narrative  . None   Past Surgical History  Procedure Laterality Date  . Back surgery  2013   Back surgery  (fusion)09/15/2010 & 2013  . Hardware removal N/A 03/14/2013    Procedure: HARDWARE REMOVAL;  Surgeon: Eustace Moore, MD;  Location: [facility];  Service: Neurosurgery;  Laterality: N/A;  . Back surgery  03-2013   Past Medical History  Diagnosis Date  . No pertinent past medical history   . Occupational injury 2013    resulted in back injury that led to surgery   BP 113/66  Pulse 74  Resp 16  Ht 5\' 6"  (1.676 m)  Wt 121 lb (54.885 kg)  BMI 19.54 kg/m2  SpO2 96%  Opioid Risk Score:   Fall Risk Score: Moderate Fall Risk (6-13 points) (pt educated, declined handout)    Review of Systems  Musculoskeletal: Positive for back pain and gait problem.  Neurological: Positive for tremors, weakness and numbness.  All other systems reviewed and are negative.      Objective:   Physical Exam  Nursing note and vitals reviewed. Constitutional: He is oriented to person, place, and time. He appears well-developed and well-nourished.  HENT:  Head: Normocephalic and atraumatic.  Neck: Normal range of motion. Neck supple.  Cardiovascular: Normal rate and regular rhythm.   Pulmonary/Chest: Effort normal and breath sounds normal.  Musculoskeletal:  Normal Muscle  Bulk and Muscle Testing Reveals: Upper Extremities: Full ROM and Muscle strength 5/5 Thoracic and Lumbar Hypersensitivity Lower Extremities: Full ROM and Muscle strength 5/5 Arises from Table without difficulty. Narrow Based Gait    Neurological: He is alert and oriented to person, place, and time.  Skin: Skin is warm and dry.  Psychiatric: He has a normal mood and affect.          Assessment & Plan:  1. Lumbar postlaminectomy syndrome status post lumbar fusion x3: His most recent surgery was 03/14/2013. He had hardware removal and bone allograft waist and L2-3.  Refilled: oxyCODONE 7.5/325mg  one tablet every 4 hours as needed #90 and OPANA 20 mg one tablet every 12 hours #60.  2. Greater Trochanteric Bursitis:  No Complaints Voiced Today. Continue with heat and exercise regimen.   20 minutes of face to face patient care time was spent during this visit. All questions were encouraged and answered.   F/U in 1 month

## 2013-10-30 ENCOUNTER — Telehealth: Payer: Self-pay | Admitting: *Deleted

## 2013-10-30 MED ORDER — OXYMORPHONE HCL ER 20 MG PO TB12: 20.0000 mg | ORAL_TABLET | Freq: Two times a day (BID) | ORAL | Status: DC

## 2013-10-30 MED ORDER — OXYCODONE-ACETAMINOPHEN 7.5-325 MG PO TABS: 1.0000 | ORAL_TABLET | ORAL | Status: DC | PRN

## 2013-10-30 NOTE — Telephone Encounter (Signed)
Bruce Olson is going to be out of his medication appropriately on Saturday and his appt with Kirsteins is Monday 11/04/13.  I offered appt today with Bruce Olson at 3:30 but he has another MD appt at 2:30 and cannot come at that time.  We will keep his appt with Bruce Olson 11/04/13 and let him pick up rxs today.  Printed for [employer] to sign.  Bruce Olson aware.

## 2013-11-04 ENCOUNTER — Ambulatory Visit: Payer: Medicare Other | Admitting: Physical Medicine & Rehabilitation

## 2013-11-05 ENCOUNTER — Ambulatory Visit (HOSPITAL_BASED_OUTPATIENT_CLINIC_OR_DEPARTMENT_OTHER): Payer: Medicare Other | Admitting: Physical Medicine & Rehabilitation

## 2013-11-05 ENCOUNTER — Encounter: Payer: Self-pay | Admitting: Physical Medicine & Rehabilitation

## 2013-11-05 VITALS — BP 153/78 | HR 66 | Resp 14 | Ht 66.0 in | Wt 121.2 lb

## 2013-11-05 DIAGNOSIS — M961 Postlaminectomy syndrome, not elsewhere classified: Secondary | ICD-10-CM

## 2013-11-05 DIAGNOSIS — M542 Cervicalgia: Secondary | ICD-10-CM

## 2013-11-05 DIAGNOSIS — M5416 Radiculopathy, lumbar region: Secondary | ICD-10-CM

## 2013-11-05 DIAGNOSIS — Z5181 Encounter for therapeutic drug level monitoring: Secondary | ICD-10-CM | POA: Diagnosis present

## 2013-11-05 DIAGNOSIS — Z9889 Other specified postprocedural states: Secondary | ICD-10-CM | POA: Insufficient documentation

## 2013-11-05 DIAGNOSIS — Z79899 Other long term (current) drug therapy: Secondary | ICD-10-CM | POA: Insufficient documentation

## 2013-11-05 MED ORDER — PREGABALIN 100 MG PO CAPS: 100.0000 mg | ORAL_CAPSULE | Freq: Three times a day (TID) | ORAL | Status: DC

## 2013-11-05 NOTE — Progress Notes (Signed)
Subjective:    Patient ID: Bruce Olson, male    DOB: [birth year], 58 y.o.   MRN: [id number]  HPI Still smoking one pack per day Having some dental surgery Still complaining of cold feet, no foot lesions Continues to have right anterior thigh numbness Pain Inventory Average Pain 8 Pain Right Now 8 My pain is constant, sharp, stabbing and tingling  In the last 24 hours, has pain interfered with the following? General activity 8 Relation with others 8 Enjoyment of life 8 What TIME of day is your pain at its worst? All Sleep (in general) Poor  Pain is worse with: walking, inactivity and standing Pain improves with: medication and TENS Relief from Meds: 8  Mobility walk without assistance  Function retired  Neuro/Psych weakness numbness tremor trouble walking  Prior Studies Any changes since last visit?  no  Physicians involved in your care Any changes since last visit?  no   Family History  Problem Relation Age of Onset  . Anesthesia problems Neg Hx   . Hypotension Neg Hx   . Malignant hyperthermia Neg Hx   . Pseudochol deficiency Neg Hx    History   Social History  . Marital Status: Single    Spouse Name: N/A    Number of Children: N/A  . Years of Education: N/A   Social History Main Topics  . Smoking status: Current Every Day Smoker -- 1.50 packs/day for 33 years    Types: Cigarettes  . Smokeless tobacco: Never Used  . Alcohol Use: Yes     Comment: beer , q couple of months  . Drug Use: No  . Sexual Activity: No     Comment: back problems   Other Topics Concern  . None   Social History Narrative  . None   Past Surgical History  Procedure Laterality Date  . Back surgery  2013    Back surgery  (fusion)09/15/2010 & 2013  . Hardware removal N/A 03/14/2013    Procedure: HARDWARE REMOVAL;  Surgeon: Eustace Moore, MD;  Location: [place] [facility];  Service: Neurosurgery;  Laterality: N/A;  . Back surgery  03-2013   Past Medical History    Diagnosis Date  . No pertinent past medical history   . Occupational injury 2013    resulted in back injury that led to surgery   BP 153/78  Pulse 66  Resp 14  Ht 5\' 6"  (1.676 m)  Wt 121 lb 3.2 oz (54.976 kg)  BMI 19.57 kg/m2  SpO2 96%  Opioid Risk Score:   Fall Risk Score: Moderate Fall Risk (6-13 points)   Review of Systems     Objective:   Physical Exam  Neurological:  Reflex Scores:      Tricep reflexes are 3+ on the right side and 3+ on the left side.      Bicep reflexes are 3+ on the right side and 3+ on the left side.      Brachioradialis reflexes are 3+ on the right side and 3+ on the left side.      Patellar reflexes are 3+ on the right side and 3+ on the left side.      Achilles reflexes are 3+ on the right side and 3+ on the left side.    Trace edema at the ankle  Lumbar incision well healed  Range of motion extremity Ltd. 50% flexion , 25%extension, 25% lateral bending, 50% rotation Normal bilateral hip ROM 5/5 strength bilateral hip flexor knee extensor  ankle dorsiflexor and plantar flexor Lumbar spine has no tenderness with palpation of the paraspinal muscles or spinous processes.. General no acute distress Mood and affect are appropriate Ambulation is/  Right cervical rotation 75%, left 100%  Cervical extension 75% Cervical flexion 100%  Tenderness to palpation right upper trapezius, right cervical lateral masses, no tenderness over the cervical spinous process  Upper extremity strength is normal     Assessment & Plan:  1. Lumbar postlaminectomy syndrome status post lumbar fusion x3 most recently with hardware removal and bone allograft waist and L2-3 proximally 8 months postoperative  Patient was sent here by neurosurgery to resume chronic pain medications.  Patient will finish his current prescriptions written by neurosurgery and I will give him new prescriptions that he can start filling on 05/20/2013  Return to clinic in approximately 3  weeks with nurse practitioner  Opana ER 20 mg twice a day  Oxycodone 7.51 tablet 3 times per day  Lyrica 100 mg 3 times per day   2. Cervicalgia with hyperactive deep tendon reflexes. He may have some cervical central stenosis. No sign of radiculopathy. We discussed that he develops weakness or numbness in his arms or difficulty walking he should call our office. At this point will hold off on imaging studies since it will not change treatment  Continue opioid monitoring program. This consists of regular clinic visits, examinations, urine drug screen, pill counts as well as use of [place] controlled substance reporting System.   Feel patient qualifies for handicap parking placard, permanent

## 2013-11-25 ENCOUNTER — Encounter: Payer: Medicare Other | Attending: Physical Medicine and Rehabilitation | Admitting: Registered Nurse

## 2013-11-25 ENCOUNTER — Encounter: Payer: Self-pay | Admitting: Registered Nurse

## 2013-11-25 ENCOUNTER — Other Ambulatory Visit: Payer: Self-pay | Admitting: Physical Medicine & Rehabilitation

## 2013-11-25 VITALS — BP 129/55 | HR 55 | Resp 14 | Ht 66.0 in | Wt 120.0 lb

## 2013-11-25 DIAGNOSIS — Z5181 Encounter for therapeutic drug level monitoring: Secondary | ICD-10-CM

## 2013-11-25 DIAGNOSIS — Z9889 Other specified postprocedural states: Secondary | ICD-10-CM

## 2013-11-25 DIAGNOSIS — M542 Cervicalgia: Secondary | ICD-10-CM

## 2013-11-25 DIAGNOSIS — M961 Postlaminectomy syndrome, not elsewhere classified: Secondary | ICD-10-CM

## 2013-11-25 DIAGNOSIS — Z79899 Other long term (current) drug therapy: Secondary | ICD-10-CM

## 2013-11-25 DIAGNOSIS — M5416 Radiculopathy, lumbar region: Secondary | ICD-10-CM

## 2013-11-25 MED ORDER — OXYMORPHONE HCL ER 20 MG PO TB12: 20.0000 mg | ORAL_TABLET | Freq: Two times a day (BID) | ORAL | Status: DC

## 2013-11-25 MED ORDER — OXYCODONE-ACETAMINOPHEN 7.5-325 MG PO TABS: 1.0000 | ORAL_TABLET | ORAL | Status: DC | PRN

## 2013-11-25 NOTE — Progress Notes (Signed)
Subjective:    Patient ID: [name], male    DOB: April 06, 1955, 58 y.o.   MRN: [id number]  HPI: [name] is a 58 year old male who returns for follow up for chronic pain and medication refill. Marland Kitchen He says his pain is located in his neck, he denies pain radiating into his upper extremities and lower back. He rates his pain 8. He's wearing his back brace. His current exercise regime is walking and performing stretching exercises.   Pain Inventory Average Pain 8 Pain Right Now 8 My pain is constant, sharp, stabbing and tingling  In the last 24 hours, has pain interfered with the following? General activity 8 Relation with others 8 Enjoyment of life 8 What TIME of day is your pain at its worst? morning, day, evening, night  Sleep (in general) Poor  Pain is worse with: walking, sitting and standing Pain improves with: medication and TENS Relief from Meds: 8  Mobility walk without assistance use a walker  Function disabled: date disabled 05/2010  Neuro/Psych weakness numbness tremor trouble walking  Prior Studies Any changes since last visit?  no  Physicians involved in your care Any changes since last visit?  no   Family History  Problem Relation Age of Onset  . Anesthesia problems Neg Hx   . Hypotension Neg Hx   . Malignant hyperthermia Neg Hx   . Pseudochol deficiency Neg Hx    History   Social History  . Marital Status: Single    Spouse Name: N/A    Number of Children: N/A  . Years of Education: N/A   Social History Main Topics  . Smoking status: Current Every Day Smoker -- 1.50 packs/day for 33 years    Types: Cigarettes  . Smokeless tobacco: Never Used  . Alcohol Use: Yes     Comment: beer , q couple of months  . Drug Use: No  . Sexual Activity: No     Comment: back problems   Other Topics Concern  . None   Social History Narrative  . None   Past Surgical History  Procedure Laterality Date  . Back surgery  2013   Back surgery  (fusion)09/15/2010 & 2013  . Hardware removal N/A 03/14/2013    Procedure: HARDWARE REMOVAL;  Surgeon: Eustace Moore, MD;  Location: [place] [facility];  Service: Neurosurgery;  Laterality: N/A;  . Back surgery  03-2013   Past Medical History  Diagnosis Date  . No pertinent past medical history   . Occupational injury 2013    resulted in back injury that led to surgery   BP 129/55  Pulse 55  Resp 14  Ht 5\' 6"  (1.676 m)  Wt 120 lb (54.432 kg)  BMI 19.38 kg/m2  SpO2 99%  Opioid Risk Score:   Fall Risk Score: Low Fall Risk (0-5 points)  Review of Systems     Objective:   Physical Exam  Nursing note and vitals reviewed. Constitutional: He is oriented to person, place, and time. He appears well-developed and well-nourished.  HENT:  Head: Normocephalic and atraumatic.  Neck: Neck supple.  Cervical  Paraspinal Tenderness: C-3- C-5 Cervical Right Rotation Decreased ROM 30 Degrees Cervical Left Rotation Full ROM  Cervical Decreased ROM with Flexion and Extension   Cardiovascular: Normal rate and regular rhythm.   Pulmonary/Chest: Effort normal and breath sounds normal.  Musculoskeletal:  Normal Muscle Bulk and Muscle Testing Reveals: Upper Extremities: Full ROM and Muscle Strength 5/5 Lumbar Paraspinal Tenderness:  L-3-L-5 Back Brace Intact Lower Extremities: Full ROM  Arises from chair with ease Narrow Based gait  Neurological: He is alert and oriented to person, place, and time.  Skin: Skin is warm and dry.  Psychiatric: He has a normal mood and affect.          Assessment & Plan:  1. Lumbar postlaminectomy syndrome status post lumbar fusion x3: His most recent surgery was 03/14/2013. He had hardware removal and bone allograft waist and L2-3.  Refilled: oxyCODONE 7.5/325mg  one tablet every 4 hours as needed #90 and OPANA 20 mg one tablet every 12 hours #60.  2. Greater Trochanteric Bursitis: No Complaints Voiced Today. Continue with heat and exercise regimen.  20  minutes of face to face patient care time was spent during this visit. All questions were encouraged and answered.  F/U in 1 month

## 2013-11-26 LAB — PMP ALCOHOL METABOLITE (ETG): ETGU: NEGATIVE ng/mL

## 2013-11-27 LAB — OXYCODONE, URINE (LC/MS-MS)
Noroxycodone, Ur: 4677 ng/mL (ref <50)
OXYMORPHONE, URINE: 11254 ng/mL (ref <50)
Oxycodone, ur: 1888 ng/mL (ref <50)

## 2013-11-27 LAB — OPIATES/OPIOIDS (LC/MS-MS)
Codeine Urine: NEGATIVE ng/mL (ref <50)
Hydrocodone: NEGATIVE ng/mL (ref <50)
Hydromorphone: NEGATIVE ng/mL (ref <50)
Morphine Urine: NEGATIVE ng/mL (ref <50)
NORHYDROCODONE, UR: NEGATIVE ng/mL (ref <50)
NOROXYCODONE, UR: 4677 ng/mL (ref <50)
OXYCODONE, UR: 1888 ng/mL (ref <50)
Oxymorphone: 11254 ng/mL (ref <50)

## 2013-11-28 LAB — PRESCRIPTION MONITORING PROFILE (SOLSTAS)
AMPHETAMINE/METH: NEGATIVE ng/mL
BUPRENORPHINE, URINE: NEGATIVE ng/mL
Barbiturate Screen, Urine: NEGATIVE ng/mL
Benzodiazepine Screen, Urine: NEGATIVE ng/mL
CANNABINOID SCRN UR: NEGATIVE ng/mL
Carisoprodol, Urine: NEGATIVE ng/mL
Cocaine Metabolites: NEGATIVE ng/mL
Creatinine, Urine: 51.46 mg/dL (ref >20.0)
FENTANYL URINE: NEGATIVE ng/mL
MDMA URINE: NEGATIVE ng/mL
METHADONE SCREEN, URINE: NEGATIVE ng/mL
Meperidine, Ur: NEGATIVE ng/mL
Nitrites, Initial: NEGATIVE ug/mL
Propoxyphene: NEGATIVE ng/mL
TAPENTADOLUR: NEGATIVE ng/mL
Tramadol Scrn, Ur: NEGATIVE ng/mL
Zolpidem, Urine: NEGATIVE ng/mL
pH, Initial: 5.1 pH (ref 4.5–8.9)

## 2013-12-18 ENCOUNTER — Encounter: Payer: Self-pay | Admitting: Registered Nurse

## 2013-12-18 ENCOUNTER — Encounter: Payer: Medicare Other | Attending: Physical Medicine and Rehabilitation | Admitting: Registered Nurse

## 2013-12-18 VITALS — BP 96/64 | HR 66 | Resp 14 | Ht 66.0 in | Wt 131.0 lb

## 2013-12-18 DIAGNOSIS — Z79899 Other long term (current) drug therapy: Secondary | ICD-10-CM | POA: Insufficient documentation

## 2013-12-18 DIAGNOSIS — M542 Cervicalgia: Secondary | ICD-10-CM | POA: Diagnosis present

## 2013-12-18 DIAGNOSIS — Z9889 Other specified postprocedural states: Secondary | ICD-10-CM

## 2013-12-18 DIAGNOSIS — M961 Postlaminectomy syndrome, not elsewhere classified: Secondary | ICD-10-CM | POA: Diagnosis present

## 2013-12-18 DIAGNOSIS — M5416 Radiculopathy, lumbar region: Secondary | ICD-10-CM | POA: Diagnosis present

## 2013-12-18 DIAGNOSIS — Z5181 Encounter for therapeutic drug level monitoring: Secondary | ICD-10-CM | POA: Insufficient documentation

## 2013-12-18 MED ORDER — OXYCODONE-ACETAMINOPHEN 7.5-325 MG PO TABS: 1.0000 | ORAL_TABLET | ORAL | Status: DC | PRN

## 2013-12-18 MED ORDER — OXYMORPHONE HCL ER 20 MG PO TB12: 20.0000 mg | ORAL_TABLET | Freq: Two times a day (BID) | ORAL | Status: DC

## 2013-12-18 NOTE — Progress Notes (Signed)
Subjective:    Patient ID: Bruce Olson, male    DOB: February 13, 1955, 58 y.o.   MRN: [id number]  HPI: [name] is a 58 year old male who returns for follow up for chronic pain and medication refill. Marland Kitchen He says his pain is located in his neck and lower back. He rates his pain 8. He's wearing his back brace. His current exercise regime is walking and performing stretching exercises.  Pain Inventory Average Pain 8 Pain Right Now 8 My pain is constant, sharp, stabbing and tingling  In the last 24 hours, has pain interfered with the following? General activity 8 Relation with others 8 Enjoyment of life 8 What TIME of day is your pain at its worst? all Sleep (in general) Poor  Pain is worse with: walking, sitting and standing Pain improves with: heat/ice, medication and TENS Relief from Meds: 7  Mobility walk without assistance use a walker how many minutes can you walk? 20 ability to climb steps?  yes do you drive?  yes  Function disabled: date disabled .  Neuro/Psych weakness tingling trouble walking  Prior Studies Any changes since last visit?  no  Physicians involved in your care Any changes since last visit?  no   Family History  Problem Relation Age of Onset  . Anesthesia problems Neg Hx   . Hypotension Neg Hx   . Malignant hyperthermia Neg Hx   . Pseudochol deficiency Neg Hx    History   Social History  . Marital Status: Single    Spouse Name: N/A    Number of Children: N/A  . Years of Education: N/A   Social History Main Topics  . Smoking status: Current Every Day Smoker -- 1.50 packs/day for 33 years    Types: Cigarettes  . Smokeless tobacco: Never Used  . Alcohol Use: Yes     Comment: beer , q couple of months  . Drug Use: No  . Sexual Activity: No     Comment: back problems   Other Topics Concern  . None   Social History Narrative   Past Surgical History  Procedure Laterality Date  . Back surgery  2013    Back surgery   (fusion)09/15/2010 & 2013  . Hardware removal N/A 03/14/2013    Procedure: HARDWARE REMOVAL;  Surgeon: Eustace Moore, MD;  Location: [place] [facility];  Service: Neurosurgery;  Laterality: N/A;  . Back surgery  03-2013   Past Medical History  Diagnosis Date  . No pertinent past medical history   . Occupational injury 2013    resulted in back injury that led to surgery   BP 96/64 mmHg  Pulse 66  Resp 14  Ht 5\' 6"  (1.676 m)  Wt 131 lb (59.421 kg)  BMI 21.15 kg/m2  SpO2 96%  Opioid Risk Score:   Fall Risk Score: Low Fall Risk (0-5 points)  Review of Systems  HENT: Negative.   Eyes: Negative.   Respiratory: Negative.   Cardiovascular: Negative.   Gastrointestinal: Negative.   Endocrine: Negative.   Genitourinary: Negative.   Musculoskeletal: Positive for myalgias and back pain.  Allergic/Immunologic: Negative.   Neurological: Positive for tremors, weakness and numbness.  Hematological: Negative.   Psychiatric/Behavioral: Negative.        Objective:   Physical Exam  Constitutional: He is oriented to person, place, and time. He appears well-developed and well-nourished.  HENT:  Head: Normocephalic and atraumatic.  Neck: Normal range of motion. Neck supple.  Cervical Paraspinal  Tenderness: C-5- C-6  Cardiovascular: Normal rate and regular rhythm.   Pulmonary/Chest: Effort normal and breath sounds normal.  Musculoskeletal:  Normal Muscle Bulk and Muscle testing Reveals: Upper Extremities: Full ROM and Muscle strength 5/5 Lumbar Paraspinal Tenderness: L-3- L-5 Back Brace Intact Lower Extremities: Full ROM and Muscle Strength 5/5 Arises from table without difficulty Narrow Based gait   Neurological: He is alert and oriented to person, place, and time.  Skin: Skin is warm and dry.  Psychiatric: He has a normal mood and affect.  Nursing note and vitals reviewed.         Assessment & Plan:  1. Lumbar postlaminectomy syndrome status post lumbar fusion x3: His most recent  surgery was 03/14/2013. He had hardware removal and bone allograft waist and L2-3.  Refilled: oxyCODONE 7.5/325mg  one tablet every 4 hours as needed #100 and OPANA 20 mg one tablet every 12 hours #60.  2. Greater Trochanteric Bursitis: No Complaints Voiced Today. Continue with heat and exercise regimen.   15 minutes of face to face patient care time was spent during this visit. All questions were encouraged and answered.   F/U in 1 month

## 2014-01-17 ENCOUNTER — Ambulatory Visit (HOSPITAL_BASED_OUTPATIENT_CLINIC_OR_DEPARTMENT_OTHER): Payer: Medicare Other | Admitting: Physical Medicine & Rehabilitation

## 2014-01-17 ENCOUNTER — Encounter: Payer: Medicare Other | Attending: Physical Medicine and Rehabilitation

## 2014-01-17 ENCOUNTER — Encounter: Payer: Self-pay | Admitting: Physical Medicine & Rehabilitation

## 2014-01-17 VITALS — BP 121/66 | HR 62 | Resp 14

## 2014-01-17 DIAGNOSIS — Z79899 Other long term (current) drug therapy: Secondary | ICD-10-CM | POA: Insufficient documentation

## 2014-01-17 DIAGNOSIS — M961 Postlaminectomy syndrome, not elsewhere classified: Secondary | ICD-10-CM | POA: Diagnosis not present

## 2014-01-17 DIAGNOSIS — M5416 Radiculopathy, lumbar region: Secondary | ICD-10-CM | POA: Diagnosis present

## 2014-01-17 DIAGNOSIS — M542 Cervicalgia: Secondary | ICD-10-CM

## 2014-01-17 DIAGNOSIS — Z5181 Encounter for therapeutic drug level monitoring: Secondary | ICD-10-CM | POA: Insufficient documentation

## 2014-01-17 DIAGNOSIS — Z9889 Other specified postprocedural states: Secondary | ICD-10-CM | POA: Diagnosis present

## 2014-01-17 MED ORDER — OXYMORPHONE HCL ER 20 MG PO TB12: 20.0000 mg | ORAL_TABLET | Freq: Two times a day (BID) | ORAL | Status: DC

## 2014-01-17 MED ORDER — OXYCODONE-ACETAMINOPHEN 7.5-325 MG PO TABS: 1.0000 | ORAL_TABLET | ORAL | Status: DC | PRN

## 2014-01-17 NOTE — Patient Instructions (Signed)
Neck xrays can be done at [facility]

## 2014-01-17 NOTE — Progress Notes (Signed)
Subjective:    Patient ID: Bruce Olson, male    DOB: [birth year], 58 y.o.   MRN: [id number]  HPI Right side of neck still painful. No pain into the right arm. Still has right anterior thigh numbness chronic, finished up with dental surgery Still smoking approximately 1 pack per day  Pain Inventory Average Pain 8 Pain Right Now 8 My pain is sharp, stabbing and tingling  In the last 24 hours, has pain interfered with the following? General activity 8 Relation with others 8 Enjoyment of life 8 What TIME of day is your pain at its worst? all Sleep (in general) Poor  Pain is worse with: walking, sitting and standing Pain improves with: medication and TENS Relief from Meds: 8  Mobility walk without assistance use a walker how many minutes can you walk? 15 ability to climb steps?  yes do you drive?  yes  Function disabled: date disabled . retired  Neuro/Psych weakness numbness tremor trouble walking  Prior Studies Any changes since last visit?  no  Physicians involved in your care Any changes since last visit?  no   Family History  Problem Relation Age of Onset  . Anesthesia problems Neg Hx   . Hypotension Neg Hx   . Malignant hyperthermia Neg Hx   . Pseudochol deficiency Neg Hx    History   Social History  . Marital Status: Single    Spouse Name: N/A    Number of Children: N/A  . Years of Education: N/A   Social History Main Topics  . Smoking status: Current Every Day Smoker -- 1.50 packs/day for 33 years    Types: Cigarettes  . Smokeless tobacco: Never Used  . Alcohol Use: Yes     Comment: beer , q couple of months  . Drug Use: No  . Sexual Activity: No     Comment: back problems   Other Topics Concern  . None   Social History Narrative   Past Surgical History  Procedure Laterality Date  . Back surgery  2013    Back surgery  (fusion)09/15/2010 & 2013  . Hardware removal N/A 03/14/2013    Procedure: HARDWARE REMOVAL;  Surgeon: Eustace Moore, MD;  Location: [place] [facility];  Service: Neurosurgery;  Laterality: N/A;  . Back surgery  03-2013   Past Medical History  Diagnosis Date  . No pertinent past medical history   . Occupational injury 2013    resulted in back injury that led to surgery   BP 121/66 mmHg  Pulse 62  Resp 14  SpO2 99%  Opioid Risk Score:   Fall Risk Score: Low Fall Risk (0-5 points) (pt declined pamphlet)  Review of Systems  Musculoskeletal: Positive for gait problem.  Neurological: Positive for tremors, weakness and numbness.  All other systems reviewed and are negative.      Objective:   Physical Exam  Constitutional: He appears well-developed.  thin  HENT:  Head: Normocephalic and atraumatic.  Eyes: Conjunctivae and EOM are normal. Pupils are equal, round, and reactive to light.  Musculoskeletal:       Cervical back: He exhibits decreased range of motion, tenderness and deformity.       Lumbar back: He exhibits decreased range of motion.  Head forward posture prominence of spontaneous capitis on the right bilateral trapezius left longissimus capitis  Tenderness in bilateral upper trapezius  Neurological: He has normal strength. He displays no tremor. No sensory deficit. Gait normal.  Psychiatric: He has a  normal mood and affect.  Nursing note and vitals reviewed.         Assessment & Plan:  1. Lumbar postlaminectomy syndrome status post lumbar fusion x3 most recently with hardware removal and bone allograft waist at L2-3    Return to clinic in approximately 4 weeks with nurse practitioner  Opana ER 20 mg twice a day  Oxycodone 7.51 tablet 3 times per day  Lyrica 100 mg 3 times per day  2. Cervicalgia with hyperactive deep tendon reflexes. He may have some cervical central stenosis. No sign of radiculopathy. We discussed that he develops weakness or numbness in his arms or difficulty walking he should call our office.Given duration of symptoms we will order x-rays of the  cervical spine   If this is negative consider the diagnosis of cervical dystonia given the prominence and tightness of his muscles in the cervical paraspinal area. May need EMG to further document   Continue opioid monitoring program. This consists of regular clinic visits, examinations, urine drug screen, pill counts as well as use of [place] controlled substance reporting System.

## 2014-02-13 ENCOUNTER — Encounter (HOSPITAL_COMMUNITY): Payer: Self-pay | Admitting: Neurological Surgery

## 2014-02-24 ENCOUNTER — Ambulatory Visit (HOSPITAL_BASED_OUTPATIENT_CLINIC_OR_DEPARTMENT_OTHER): Payer: Medicare Other | Admitting: Physical Medicine & Rehabilitation

## 2014-02-24 ENCOUNTER — Encounter: Payer: Medicare Other | Attending: Physical Medicine and Rehabilitation

## 2014-02-24 ENCOUNTER — Encounter: Payer: Self-pay | Admitting: Physical Medicine & Rehabilitation

## 2014-02-24 VITALS — BP 127/67 | HR 75 | Resp 14

## 2014-02-24 DIAGNOSIS — M961 Postlaminectomy syndrome, not elsewhere classified: Secondary | ICD-10-CM | POA: Diagnosis not present

## 2014-02-24 DIAGNOSIS — M5416 Radiculopathy, lumbar region: Secondary | ICD-10-CM

## 2014-02-24 DIAGNOSIS — Z79899 Other long term (current) drug therapy: Secondary | ICD-10-CM | POA: Diagnosis not present

## 2014-02-24 DIAGNOSIS — Z5181 Encounter for therapeutic drug level monitoring: Secondary | ICD-10-CM | POA: Diagnosis not present

## 2014-02-24 DIAGNOSIS — M542 Cervicalgia: Secondary | ICD-10-CM | POA: Insufficient documentation

## 2014-02-24 DIAGNOSIS — Z9889 Other specified postprocedural states: Secondary | ICD-10-CM | POA: Diagnosis not present

## 2014-02-24 MED ORDER — OXYMORPHONE HCL ER 20 MG PO TB12: 20.0000 mg | ORAL_TABLET | Freq: Two times a day (BID) | ORAL | Status: DC

## 2014-02-24 MED ORDER — OXYCODONE-ACETAMINOPHEN 7.5-325 MG PO TABS: 1.0000 | ORAL_TABLET | ORAL | Status: DC | PRN

## 2014-02-24 NOTE — Patient Instructions (Signed)
Please walk 10 minutes 3 times a day weather permitting

## 2014-02-24 NOTE — Progress Notes (Signed)
Subjective:    Patient ID: Bruce Olson, male    DOB: 1955-11-22, 59 y.o.   MRN: [id number]  HPI Neck pain has improved since last visit. Also the numbness and tingling in the arms has also improved. No falls or trauma. No new leg weakness, continues to have some numbness in the right anterior thigh. No nausea, vomiting, constipation. No other reported side effects from pain medication. Denies drinking alcohol. Pain Inventory Average Pain 8 Pain Right Now 8 My pain is sharp, stabbing and tingling  In the last 24 hours, has pain interfered with the following? General activity 8 Relation with others 8 Enjoyment of life 8 What TIME of day is your pain at its worst? all Sleep (in general) Poor  Pain is worse with: walking, sitting and standing Pain improves with: medication and TENS Relief from Meds: 8  Mobility walk without assistance use a walker ability to climb steps?  yes do you drive?  yes  Function disabled: date disabled 04/12  Neuro/Psych weakness numbness tremor trouble walking  Prior Studies Any changes since last visit?  no  Physicians involved in your care Any changes since last visit?  no   Family History  Problem Relation Age of Onset  . Anesthesia problems Neg Hx   . Hypotension Neg Hx   . Malignant hyperthermia Neg Hx   . Pseudochol deficiency Neg Hx    History   Social History  . Marital Status: Single    Spouse Name: N/A    Number of Children: N/A  . Years of Education: N/A   Social History Main Topics  . Smoking status: Current Every Day Smoker -- 1.50 packs/day for 33 years    Types: Cigarettes  . Smokeless tobacco: Never Used  . Alcohol Use: Yes     Comment: beer , q couple of months  . Drug Use: No  . Sexual Activity: No     Comment: back problems   Other Topics Concern  . None   Social History Narrative   Past Surgical History  Procedure Laterality Date  . Back surgery  2013    Back surgery  (fusion)09/15/2010 &  2013  . Hardware removal N/A 03/14/2013    Procedure: HARDWARE REMOVAL;  Surgeon: Eustace Moore, MD;  Location: [facility];  Service: Neurosurgery;  Laterality: N/A;  . Back surgery  03-2013   Past Medical History  Diagnosis Date  . No pertinent past medical history   . Occupational injury 2013    resulted in back injury that led to surgery   BP 127/67 mmHg  Pulse 75  Resp 14  SpO2 99%  Opioid Risk Score:   Fall Risk Score: Moderate Fall Risk (6-13 points) (previoulsy educated and declined handout)  Review of Systems  Musculoskeletal: Positive for gait problem.  Neurological: Positive for tremors, weakness and numbness.  All other systems reviewed and are negative.      Objective:   Physical Exam  Constitutional: He is oriented to person, place, and time. He appears well-developed and well-nourished.  HENT:  Head: Normocephalic and atraumatic.  Neurological: He is alert and oriented to person, place, and time. He exhibits normal muscle tone.  Reflex Scores:      Tricep reflexes are 2+ on the right side and 3+ on the left side.      Bicep reflexes are 2+ on the right side and 2+ on the left side.      Brachioradialis reflexes are 2+ on the  right side and 2+ on the left side.      Patellar reflexes are 2+ on the right side and 2+ on the left side.      Achilles reflexes are 2+ on the right side and 2+ on the left side. 4/5 right quad, 5/5 left quad 4/5 right hip flexor, 5/5 left hip flexor 5/5 bilateral ankle dorsiflexor 5/5 bilateral deltoid, bicep, tricep, grip  Nursing note and vitals reviewed.  Limited run lumbar range of motion approximately 25% flexion extension lateral bending and rotation       Assessment & Plan:  1. Lumbar postlaminectomy syndrome with chronic postoperative pain, has some chronic radicular pain right anterior thigh No medication side effects. Reviewed no alcohol with these type medications. Continue Opana ER 20 mg twice a day

## 2014-03-24 ENCOUNTER — Other Ambulatory Visit: Payer: Self-pay | Admitting: Physical Medicine & Rehabilitation

## 2014-03-24 ENCOUNTER — Encounter: Payer: Self-pay | Admitting: Registered Nurse

## 2014-03-24 ENCOUNTER — Encounter: Payer: Medicare Other | Attending: Physical Medicine and Rehabilitation | Admitting: Registered Nurse

## 2014-03-24 VITALS — BP 147/79 | HR 69 | Resp 14

## 2014-03-24 DIAGNOSIS — Z5181 Encounter for therapeutic drug level monitoring: Secondary | ICD-10-CM | POA: Insufficient documentation

## 2014-03-24 DIAGNOSIS — M5416 Radiculopathy, lumbar region: Secondary | ICD-10-CM

## 2014-03-24 DIAGNOSIS — M542 Cervicalgia: Secondary | ICD-10-CM | POA: Diagnosis not present

## 2014-03-24 DIAGNOSIS — Z79899 Other long term (current) drug therapy: Secondary | ICD-10-CM | POA: Diagnosis not present

## 2014-03-24 DIAGNOSIS — Z9889 Other specified postprocedural states: Secondary | ICD-10-CM | POA: Insufficient documentation

## 2014-03-24 DIAGNOSIS — M961 Postlaminectomy syndrome, not elsewhere classified: Secondary | ICD-10-CM | POA: Insufficient documentation

## 2014-03-24 MED ORDER — OXYCODONE-ACETAMINOPHEN 7.5-325 MG PO TABS: 1.0000 | ORAL_TABLET | ORAL | Status: DC | PRN

## 2014-03-24 MED ORDER — OXYMORPHONE HCL ER 20 MG PO TB12: 20.0000 mg | ORAL_TABLET | Freq: Two times a day (BID) | ORAL | Status: DC

## 2014-03-24 NOTE — Progress Notes (Signed)
Subjective:    Patient ID: Bruce Olson, male    DOB: [birth year], 59 y.o.   MRN: [id number]  HPI: Bruce Olson is a 59 year old male who returns for follow up for chronic pain and medication refill. Marland Kitchen He says his pain is located in his lower back. He rates his pain 8. He's wearing his back brace. His current exercise regime is walking.  Pain Inventory Average Pain 8 Pain Right Now 8 My pain is sharp, stabbing and tingling  In the last 24 hours, has pain interfered with the following? General activity 8 Relation with others 8 Enjoyment of life 8 What TIME of day is your pain at its worst? all Sleep (in general) Poor  Pain is worse with: walking, sitting and standing Pain improves with: medication and TENS Relief from Meds: 8  Mobility walk without assistance use a walker how many minutes can you walk? 15 ability to climb steps?  yes do you drive?  yes  Function disabled: date disabled 05/2010  Neuro/Psych weakness numbness tremor trouble walking  Prior Studies Any changes since last visit?  no  Physicians involved in your care Any changes since last visit?  no   Family History  Problem Relation Age of Onset  . Anesthesia problems Neg Hx   . Hypotension Neg Hx   . Malignant hyperthermia Neg Hx   . Pseudochol deficiency Neg Hx    History   Social History  . Marital Status: Single    Spouse Name: N/A  . Number of Children: N/A  . Years of Education: N/A   Social History Main Topics  . Smoking status: Current Every Day Smoker -- 1.50 packs/day for 33 years    Types: Cigarettes  . Smokeless tobacco: Never Used  . Alcohol Use: Yes     Comment: beer , q couple of months  . Drug Use: No  . Sexual Activity: No     Comment: back problems   Other Topics Concern  . None   Social History Narrative   Past Surgical History  Procedure Laterality Date  . Back surgery  2013    Back surgery  (fusion)09/15/2010 & 2013  . Hardware removal N/A  03/14/2013    Procedure: HARDWARE REMOVAL;  Surgeon: Eustace Moore, MD;  Location: [facility];  Service: Neurosurgery;  Laterality: N/A;  . Back surgery  03-2013   Past Medical History  Diagnosis Date  . No pertinent past medical history   . Occupational injury 2013    resulted in back injury that led to surgery   BP 147/79 mmHg  Pulse 69  Resp 14  SpO2 96%  Opioid Risk Score:   Fall Risk Score: Moderate Fall Risk (6-13 points) (previously educated and given handout)  Review of Systems  Musculoskeletal: Positive for gait problem.  Neurological: Positive for tremors, weakness and numbness.  All other systems reviewed and are negative.      Objective:   Physical Exam  Constitutional: He is oriented to person, place, and time. He appears well-developed and well-nourished.  HENT:  Head: Normocephalic and atraumatic.  Neck: Normal range of motion. Neck supple.  Cardiovascular: Normal rate and regular rhythm.   Pulmonary/Chest: Effort normal and breath sounds normal.  Musculoskeletal:  Normal Muscle Bulk and Muscle testing Reveals: Upper Extremities: Full ROM and Muscle strength 5/5 Thoracic Paraspinal Tenderness: T-8- T-12 Lumbar Paraspinal Tenderness: L-3- L-5 Back brace Intact Lower Extremities: Full ROM and Muscle Strength 5/5 Arises from  Table without difficulties Narrow Based gait  Neurological: He is alert and oriented to person, place, and time.  Skin: Skin is warm and dry.  Psychiatric: He has a normal mood and affect.  Nursing note and vitals reviewed.         Assessment & Plan:  1. Lumbar postlaminectomy syndrome status post lumbar fusion x3: His most recent surgery was 03/14/2013. He had hardware removal and bone allograft waist and L2-3.  Refilled: oxyCODONE 7.5/325mg  one tablet every 4 hours as needed #100 and OPANA 20 mg one tablet every 12 hours #60.  2. Greater Trochanteric Bursitis: No Complaints Voiced Today. Continue with heat and exercise  regimen.   15 minutes of face to face patient care time was spent during this visit. All questions were encouraged and answered.   F/U in 1 month

## 2014-03-25 LAB — PMP ALCOHOL METABOLITE (ETG): ETGU: NEGATIVE ng/mL

## 2014-03-27 LAB — OXYCODONE, URINE (LC/MS-MS)
NOROXYCODONE, UR: 4139 ng/mL (ref <50)
Oxycodone, ur: 1633 ng/mL (ref <50)
Oxymorphone: 9306 ng/mL (ref <50)

## 2014-03-27 LAB — OPIATES/OPIOIDS (LC/MS-MS)
CODEINE URINE: NEGATIVE ng/mL (ref <50)
Hydrocodone: NEGATIVE ng/mL (ref <50)
Hydromorphone: NEGATIVE ng/mL (ref <50)
Morphine Urine: NEGATIVE ng/mL (ref <50)
NORHYDROCODONE, UR: NEGATIVE ng/mL (ref <50)
NOROXYCODONE, UR: 4139 ng/mL (ref <50)
Oxycodone, ur: 1633 ng/mL (ref <50)
Oxymorphone: 9306 ng/mL (ref <50)

## 2014-03-28 LAB — PRESCRIPTION MONITORING PROFILE (SOLSTAS)
Amphetamine/Meth: NEGATIVE ng/mL
BUPRENORPHINE, URINE: NEGATIVE ng/mL
Barbiturate Screen, Urine: NEGATIVE ng/mL
Benzodiazepine Screen, Urine: NEGATIVE ng/mL
CANNABINOID SCRN UR: NEGATIVE ng/mL
CARISOPRODOL, URINE: NEGATIVE ng/mL
Cocaine Metabolites: NEGATIVE ng/mL
Creatinine, Urine: 51.16 mg/dL (ref >20.0)
ECSTASY: NEGATIVE ng/mL
Fentanyl, Ur: NEGATIVE ng/mL
METHADONE SCREEN, URINE: NEGATIVE ng/mL
Meperidine, Ur: NEGATIVE ng/mL
NITRITES URINE, INITIAL: NEGATIVE ug/mL
Propoxyphene: NEGATIVE ng/mL
TRAMADOL UR: NEGATIVE ng/mL
Tapentadol, urine: NEGATIVE ng/mL
ZOLPIDEM, URINE: NEGATIVE ng/mL
pH, Initial: 5.1 pH (ref 4.5–8.9)

## 2014-04-16 NOTE — Progress Notes (Signed)
Urine drug screen for this encounter is consistent for prescribed medication 

## 2014-04-21 ENCOUNTER — Encounter: Payer: Self-pay | Admitting: Registered Nurse

## 2014-04-21 ENCOUNTER — Encounter: Payer: Medicare Other | Attending: Physical Medicine and Rehabilitation | Admitting: Registered Nurse

## 2014-04-21 VITALS — BP 142/86 | HR 68 | Resp 14

## 2014-04-21 DIAGNOSIS — M542 Cervicalgia: Secondary | ICD-10-CM | POA: Diagnosis not present

## 2014-04-21 DIAGNOSIS — M5416 Radiculopathy, lumbar region: Secondary | ICD-10-CM | POA: Diagnosis not present

## 2014-04-21 DIAGNOSIS — Z9889 Other specified postprocedural states: Secondary | ICD-10-CM

## 2014-04-21 DIAGNOSIS — Z79899 Other long term (current) drug therapy: Secondary | ICD-10-CM

## 2014-04-21 DIAGNOSIS — Z5181 Encounter for therapeutic drug level monitoring: Secondary | ICD-10-CM | POA: Diagnosis not present

## 2014-04-21 DIAGNOSIS — M961 Postlaminectomy syndrome, not elsewhere classified: Secondary | ICD-10-CM | POA: Diagnosis not present

## 2014-04-21 MED ORDER — OXYCODONE-ACETAMINOPHEN 7.5-325 MG PO TABS: 1.0000 | ORAL_TABLET | ORAL | Status: DC | PRN

## 2014-04-21 MED ORDER — OXYMORPHONE HCL ER 20 MG PO TB12: 20.0000 mg | ORAL_TABLET | Freq: Two times a day (BID) | ORAL | Status: DC

## 2014-04-21 NOTE — Progress Notes (Signed)
Subjective:    Patient ID: Bruce Olson, male    DOB: [birth year], 59 y.o.   MRN: [id number]  HPI: [name] is a 60 year old male who returns for follow up for chronic pain and medication refill. Marland Kitchen He says his pain is located in his lower back. He rates his pain 8. He's wearing his back brace. His current exercise regime is walking and performing stretching exercises.  Pain Inventory Average Pain 8 Pain Right Now 8 My pain is constant, sharp, stabbing and tingling  In the last 24 hours, has pain interfered with the following? General activity 8 Relation with others 8 Enjoyment of life 8 What TIME of day is your pain at its worst? ALL Sleep (in general) Poor  Pain is worse with: walking, sitting and standing Pain improves with: rest, medication and TENS Relief from Meds: 8  Mobility walk without assistance use a walker how many minutes can you walk? 15 ability to climb steps?  yes do you drive?  yes  Function disabled: date disabled 05/2010  Neuro/Psych weakness numbness tremor trouble walking  Prior Studies Any changes since last visit?  no  Physicians involved in your care Any changes since last visit?  no   Family History  Problem Relation Age of Onset  . Anesthesia problems Neg Hx   . Hypotension Neg Hx   . Malignant hyperthermia Neg Hx   . Pseudochol deficiency Neg Hx    History   Social History  . Marital Status: Single    Spouse Name: N/A  . Number of Children: N/A  . Years of Education: N/A   Social History Main Topics  . Smoking status: Current Every Day Smoker -- 1.50 packs/day for 33 years    Types: Cigarettes  . Smokeless tobacco: Never Used  . Alcohol Use: Yes     Comment: beer , q couple of months  . Drug Use: No  . Sexual Activity: No     Comment: back problems   Other Topics Concern  . None   Social History Narrative   Past Surgical History  Procedure Laterality Date  . Back surgery  2013    Back surgery   (fusion)09/15/2010 & 2013  . Hardware removal N/A 03/14/2013    Procedure: HARDWARE REMOVAL;  Surgeon: Eustace Moore, MD;  Location: [facility];  Service: Neurosurgery;  Laterality: N/A;  . Back surgery  03-2013   Past Medical History  Diagnosis Date  . No pertinent past medical history   . Occupational injury 2013    resulted in back injury that led to surgery   Pulse 68  Resp 14  SpO2 98%  BP 142/86   Opioid Risk Score:   Fall Risk Score: Moderate Fall Risk (6-13 points)`1  Depression screen PHQ 2/9  Depression screen PHQ 2/9 04/21/2014  Decreased Interest 2  Down, Depressed, Hopeless 0  PHQ - 2 Score 2  Altered sleeping 3  Tired, decreased energy 3  Change in appetite 0  Feeling bad or failure about yourself  0  Trouble concentrating 2  Moving slowly or fidgety/restless 0  Suicidal thoughts 0  PHQ-9 Score 10     Review of Systems  HENT: Negative.   Eyes: Negative.   Respiratory: Negative.   Cardiovascular: Negative.   Gastrointestinal: Negative.   Endocrine: Negative.   Genitourinary: Negative.   Musculoskeletal: Positive for myalgias, back pain and arthralgias.  Skin: Negative.   Allergic/Immunologic: Negative.   Neurological: Positive  for tremors, weakness and numbness.       Trouble walking  Hematological: Negative.   Psychiatric/Behavioral: Negative.        Objective:   Physical Exam  Constitutional: He is oriented to person, place, and time. He appears well-developed and well-nourished.  HENT:  Head: Normocephalic and atraumatic.  Neck: Normal range of motion. Neck supple.  Cardiovascular: Normal rate and regular rhythm.   Pulmonary/Chest: Effort normal and breath sounds normal.  Musculoskeletal:  Normal Muscle Bulk and Muscle Testing Reveals:  Upper Extremities: Full ROM and Muscle Strength 5/5 Thoracic Paraspinal Tenderness: T- 10- T-12 Lumbar Paraspinal Tenderness: L-3-L-5 Wearing Back Brace Lower Extremities: Full ROM and Muscle Strength  5/5 Arises from chair with ease Narrow Based Gait  Neurological: He is alert and oriented to person, place, and time.  Skin: Skin is warm and dry.  Psychiatric: He has a normal mood and affect.  Nursing note and vitals reviewed.         Assessment & Plan:  1. Lumbar postlaminectomy syndrome status post lumbar fusion x3: His most recent surgery was 03/14/2013. He had hardware removal and bone allograft waist and L2-3.  Refilled: oxyCODONE 7.5/325mg  one tablet every 4 hours as needed #100 and OPANA 20 mg one tablet every 12 hours #60.   15 minutes of face to face patient care time was spent during this visit. All questions were encouraged and answered.   F/U in 1 month

## 2014-05-14 ENCOUNTER — Other Ambulatory Visit: Payer: Self-pay | Admitting: *Deleted

## 2014-05-14 MED ORDER — PREGABALIN 100 MG PO CAPS: 100.0000 mg | ORAL_CAPSULE | Freq: Three times a day (TID) | ORAL | Status: DC

## 2014-05-14 NOTE — Telephone Encounter (Signed)
Pt asking for a refill on his lyrica, reviewed his chart, phoned in prescription refill

## 2014-05-19 ENCOUNTER — Encounter: Payer: Medicare Other | Attending: Physical Medicine and Rehabilitation | Admitting: Registered Nurse

## 2014-05-19 ENCOUNTER — Encounter: Payer: Self-pay | Admitting: Registered Nurse

## 2014-05-19 VITALS — BP 132/57 | HR 69 | Resp 14

## 2014-05-19 DIAGNOSIS — M5416 Radiculopathy, lumbar region: Secondary | ICD-10-CM | POA: Insufficient documentation

## 2014-05-19 DIAGNOSIS — M542 Cervicalgia: Secondary | ICD-10-CM | POA: Insufficient documentation

## 2014-05-19 DIAGNOSIS — Z9889 Other specified postprocedural states: Secondary | ICD-10-CM

## 2014-05-19 DIAGNOSIS — Z5181 Encounter for therapeutic drug level monitoring: Secondary | ICD-10-CM

## 2014-05-19 DIAGNOSIS — M961 Postlaminectomy syndrome, not elsewhere classified: Secondary | ICD-10-CM | POA: Diagnosis not present

## 2014-05-19 DIAGNOSIS — Z79899 Other long term (current) drug therapy: Secondary | ICD-10-CM | POA: Insufficient documentation

## 2014-05-19 MED ORDER — OXYCODONE-ACETAMINOPHEN 7.5-325 MG PO TABS: 1.0000 | ORAL_TABLET | ORAL | Status: DC | PRN

## 2014-05-19 MED ORDER — OXYMORPHONE HCL ER 20 MG PO TB12: 20.0000 mg | ORAL_TABLET | Freq: Two times a day (BID) | ORAL | Status: DC

## 2014-05-19 NOTE — Progress Notes (Signed)
Subjective:    Patient ID: Bruce Olson, male    DOB: [birth year], 59 y.o.   MRN: [id number]  HPI: [name] is a 59 year old male who returns for follow up for chronic pain and medication refill. Marland Kitchen He says his pain is located in his lower back. He rates his pain 8. He's wearing soft back brace. His current exercise regime is walking and performing stretching exercises.   Pain Inventory Average Pain 8 Pain Right Now 8 My pain is sharp, stabbing and tingling  In the last 24 hours, has pain interfered with the following? General activity 8 Relation with others 8 Enjoyment of life 8 What TIME of day is your pain at its worst? all Sleep (in general) Poor  Pain is worse with: walking, sitting and standing Pain improves with: heat/ice, medication and TENS Relief from Meds: 8  Mobility walk without assistance how many minutes can you walk? 15 ability to climb steps?  yes do you drive?  yes  Function disabled: date disabled 05/2010  Neuro/Psych weakness numbness tremor trouble walking  Prior Studies Any changes since last visit?  no  Physicians involved in your care Any changes since last visit?  no   Family History  Problem Relation Age of Onset  . Anesthesia problems Neg Hx   . Hypotension Neg Hx   . Malignant hyperthermia Neg Hx   . Pseudochol deficiency Neg Hx    History   Social History  . Marital Status: Single    Spouse Name: N/A  . Number of Children: N/A  . Years of Education: N/A   Social History Main Topics  . Smoking status: Current Every Day Smoker -- 1.50 packs/day for 33 years    Types: Cigarettes  . Smokeless tobacco: Never Used  . Alcohol Use: Yes     Comment: beer , q couple of months  . Drug Use: No  . Sexual Activity: No     Comment: back problems   Other Topics Concern  . None   Social History Narrative   Past Surgical History  Procedure Laterality Date  . Back surgery  2013    Back surgery   (fusion)09/15/2010 & 2013  . Hardware removal N/A 03/14/2013    Procedure: HARDWARE REMOVAL;  Surgeon: Eustace Moore, MD;  Location: [facility];  Service: Neurosurgery;  Laterality: N/A;  . Back surgery  03-2013   Past Medical History  Diagnosis Date  . No pertinent past medical history   . Occupational injury 2013    resulted in back injury that led to surgery   BP 132/57 mmHg  Pulse 69  Resp 14  SpO2 97%  Opioid Risk Score:   Fall Risk Score: Low Fall Risk (0-5 points)`1  Depression screen PHQ 2/9  Depression screen PHQ 2/9 04/21/2014  Decreased Interest 2  Down, Depressed, Hopeless 0  PHQ - 2 Score 2  Altered sleeping 3  Tired, decreased energy 3  Change in appetite 0  Feeling bad or failure about yourself  0  Trouble concentrating 2  Moving slowly or fidgety/restless 0  Suicidal thoughts 0  PHQ-9 Score 10    Review of Systems  Musculoskeletal: Positive for gait problem.  Neurological: Positive for tremors, weakness and numbness.  All other systems reviewed and are negative.      Objective:   Physical Exam  Constitutional: He is oriented to person, place, and time. He appears well-developed and well-nourished.  HENT:  Head: Normocephalic  and atraumatic.  Neck: Normal range of motion. Neck supple.  Cardiovascular: Normal rate and regular rhythm.   Pulmonary/Chest: Effort normal and breath sounds normal.  Musculoskeletal:  Normal Muscle Bulk and Muscle Testing Reveals: Upper Extremities: Decreased ROM 90 Degrees and Muscle Strength 5/5 Lumbar Paraspinal Tenderness: L-2-L-5 Lower Extremities: Full ROM and Muscle Strength 5/5 Arises from chair with ease Narrow Based Gait  Neurological: He is alert and oriented to person, place, and time.  Skin: Skin is warm and dry.  Psychiatric: He has a normal mood and affect.  Nursing note and vitals reviewed.         Assessment & Plan:  1. Lumbar postlaminectomy syndrome status post lumbar fusion x3: His most  recent surgery was 03/14/2013. He had hardware removal and bone allograft waist and L2-3.  Refilled: oxyCODONE 7.5/325mg  one tablet every 4 hours as needed #100 and OPANA 20 mg one tablet every 12 hours #60.   15 minutes of face to face patient care time was spent during this visit. All questions were encouraged and answered.   F/U in 1 month

## 2014-06-17 ENCOUNTER — Other Ambulatory Visit: Payer: Self-pay | Admitting: Registered Nurse

## 2014-06-17 ENCOUNTER — Encounter: Payer: Self-pay | Admitting: Registered Nurse

## 2014-06-17 ENCOUNTER — Encounter: Payer: Medicare Other | Attending: Physical Medicine and Rehabilitation | Admitting: Registered Nurse

## 2014-06-17 DIAGNOSIS — Z5181 Encounter for therapeutic drug level monitoring: Secondary | ICD-10-CM | POA: Insufficient documentation

## 2014-06-17 DIAGNOSIS — Z79899 Other long term (current) drug therapy: Secondary | ICD-10-CM | POA: Insufficient documentation

## 2014-06-17 DIAGNOSIS — Z9889 Other specified postprocedural states: Secondary | ICD-10-CM

## 2014-06-17 DIAGNOSIS — M5416 Radiculopathy, lumbar region: Secondary | ICD-10-CM | POA: Insufficient documentation

## 2014-06-17 DIAGNOSIS — M961 Postlaminectomy syndrome, not elsewhere classified: Secondary | ICD-10-CM | POA: Diagnosis not present

## 2014-06-17 DIAGNOSIS — M542 Cervicalgia: Secondary | ICD-10-CM | POA: Diagnosis not present

## 2014-06-17 MED ORDER — OXYMORPHONE HCL ER 20 MG PO TB12: 20.0000 mg | ORAL_TABLET | Freq: Two times a day (BID) | ORAL | Status: DC

## 2014-06-17 MED ORDER — OXYCODONE-ACETAMINOPHEN 7.5-325 MG PO TABS: 1.0000 | ORAL_TABLET | ORAL | Status: DC | PRN

## 2014-06-17 NOTE — Progress Notes (Signed)
Subjective:    Patient ID: Bruce Olson, male    DOB: 05-06-1955, 59 y.o.   MRN: [id number]  HPI:Mr. CANNEN DUPRAS is a 59 year old male who returns for follow up for chronic pain and medication refill. Marland Kitchen He says his pain is located in his lower back. He rates his pain 8. He's wearing his back brace. His current exercise regime is walking and performing stretching exercises.  Pain Inventory Average Pain 8 Pain Right Now 8 My pain is sharp, stabbing and tingling  In the last 24 hours, has pain interfered with the following? General activity 8 Relation with others 8 Enjoyment of life 8 What TIME of day is your pain at its worst? all Sleep (in general) Poor  Pain is worse with: walking and standing Pain improves with: medication and TENS Relief from Meds: 8  Mobility walk without assistance use a walker how many minutes can you walk? 15 ability to climb steps?  yes do you drive?  yes  Function disabled: date disabled 05/2010  Neuro/Psych weakness numbness tremor trouble walking  Prior Studies Any changes since last visit?  no  Physicians involved in your care Any changes since last visit?  no   Family History  Problem Relation Age of Onset  . Anesthesia problems Neg Hx   . Hypotension Neg Hx   . Malignant hyperthermia Neg Hx   . Pseudochol deficiency Neg Hx    History   Social History  . Marital Status: Single    Spouse Name: N/A  . Number of Children: N/A  . Years of Education: N/A   Social History Main Topics  . Smoking status: Current Every Day Smoker -- 1.50 packs/day for 33 years    Types: Cigarettes  . Smokeless tobacco: Never Used  . Alcohol Use: Yes     Comment: beer , q couple of months  . Drug Use: No  . Sexual Activity: No     Comment: back problems   Other Topics Concern  . None   Social History Narrative   Past Surgical History  Procedure Laterality Date  . Back surgery  2013    Back surgery  (fusion)09/15/2010 & 2013   . Hardware removal N/A 03/14/2013    Procedure: HARDWARE REMOVAL;  Surgeon: Eustace Moore, MD;  Location: [facility];  Service: Neurosurgery;  Laterality: N/A;  . Back surgery  03-2013   Past Medical History  Diagnosis Date  . No pertinent past medical history   . Occupational injury 2013    resulted in back injury that led to surgery   BP 155/76  P 71  Sat 97%  R 14 Opioid Risk Score:   Fall Risk Score: Moderate Fall Risk (6-13 points) (previously educated and given handout)`1  Depression screen PHQ 2/9  Depression screen PHQ 2/9 04/21/2014  Decreased Interest 2  Down, Depressed, Hopeless 0  PHQ - 2 Score 2  Altered sleeping 3  Tired, decreased energy 3  Change in appetite 0  Feeling bad or failure about yourself  0  Trouble concentrating 2  Moving slowly or fidgety/restless 0  Suicidal thoughts 0  PHQ-9 Score 10     Review of Systems  Musculoskeletal: Positive for gait problem.  Neurological: Positive for tremors, weakness and numbness.       Tingling  All other systems reviewed and are negative.      Objective:   Physical Exam  Constitutional: He is oriented to person, place, and time.  He appears well-developed and well-nourished.  HENT:  Head: Normocephalic and atraumatic.  Neck: Normal range of motion. Neck supple.  Cardiovascular: Normal rate and regular rhythm.   Pulmonary/Chest: Effort normal and breath sounds normal.  Musculoskeletal:  Normal Muscle Bulk and Muscle Testing Reveals: Upper Extremities: Full ROM and Muscle Strength 5/5 Thoracic Paraspinal Tenderness: T-10- T-12 Lumbar Paraspinal Tenderness: L-3- L-5 Lower Extremities: Full ROM and Muscle Strength 5/5 Arises from chair with Ease  Narrow Based Gait  Neurological: He is alert and oriented to person, place, and time.  Skin: Skin is warm and dry.  Psychiatric: He has a normal mood and affect.  Nursing note and vitals reviewed.         Assessment & Plan:  1. Lumbar postlaminectomy  syndrome status post lumbar fusion x3: His most recent surgery was 03/14/2013. He had hardware removal and bone allograft waist and L2-3.  Refilled: oxyCODONE 7.5/325mg  one tablet every 4 hours as needed #100 and OPANA 20 mg one tablet every 12 hours #60.  2. Greater Trochanteric Bursitis: No Complaints Voiced Today. Continue with heat and exercise regimen.   15 minutes of face to face patient care time was spent during this visit. All questions were encouraged and answered.   F/U in 1 month

## 2014-06-18 LAB — PMP ALCOHOL METABOLITE (ETG): Ethyl Glucuronide (EtG): NEGATIVE ng/mL

## 2014-06-21 LAB — OPIATES/OPIOIDS (LC/MS-MS)
Codeine Urine: NEGATIVE ng/mL (ref <50)
HYDROCODONE: NEGATIVE ng/mL (ref <50)
HYDROMORPHONE: NEGATIVE ng/mL (ref <50)
Morphine Urine: NEGATIVE ng/mL (ref <50)
Norhydrocodone, Ur: NEGATIVE ng/mL (ref <50)
Noroxycodone, Ur: 1483 ng/mL (ref <50)
OXYCODONE, UR: 361 ng/mL (ref <50)
Oxymorphone: 7783 ng/mL (ref <50)

## 2014-06-21 LAB — OXYCODONE, URINE (LC/MS-MS)
NOROXYCODONE, UR: 1483 ng/mL (ref <50)
OXYCODONE, UR: 361 ng/mL (ref <50)
OXYMORPHONE, URINE: 7783 ng/mL (ref <50)

## 2014-06-24 LAB — PRESCRIPTION MONITORING PROFILE (SOLSTAS)
AMPHETAMINE/METH: NEGATIVE ng/mL
BENZODIAZEPINE SCREEN, URINE: NEGATIVE ng/mL
Barbiturate Screen, Urine: NEGATIVE ng/mL
Buprenorphine, Urine: NEGATIVE ng/mL
CANNABINOID SCRN UR: NEGATIVE ng/mL
CARISOPRODOL, URINE: NEGATIVE ng/mL
COCAINE METABOLITES: NEGATIVE ng/mL
CREATININE, URINE: 24.72 mg/dL (ref >20.0)
Fentanyl, Ur: NEGATIVE ng/mL
MDMA URINE: NEGATIVE ng/mL
MEPERIDINE UR: NEGATIVE ng/mL
Methadone Screen, Urine: NEGATIVE ng/mL
Nitrites, Initial: NEGATIVE ug/mL
Propoxyphene: NEGATIVE ng/mL
TRAMADOL UR: NEGATIVE ng/mL
Tapentadol, urine: NEGATIVE ng/mL
ZOLPIDEM, URINE: NEGATIVE ng/mL
pH, Initial: 5 pH (ref 4.5–8.9)

## 2014-07-02 NOTE — Progress Notes (Signed)
Urine drug screen for this encounter is consistent for prescribed medication 

## 2014-07-10 ENCOUNTER — Encounter: Payer: Medicare Other | Admitting: Registered Nurse

## 2014-07-11 ENCOUNTER — Ambulatory Visit: Payer: Self-pay | Admitting: Registered Nurse

## 2014-07-11 ENCOUNTER — Encounter: Payer: Medicare Other | Attending: Physical Medicine and Rehabilitation | Admitting: Registered Nurse

## 2014-07-11 ENCOUNTER — Encounter: Payer: Self-pay | Admitting: Registered Nurse

## 2014-07-11 VITALS — BP 129/66 | HR 64 | Resp 14

## 2014-07-11 DIAGNOSIS — Z79899 Other long term (current) drug therapy: Secondary | ICD-10-CM | POA: Diagnosis not present

## 2014-07-11 DIAGNOSIS — M5416 Radiculopathy, lumbar region: Secondary | ICD-10-CM | POA: Diagnosis not present

## 2014-07-11 DIAGNOSIS — M542 Cervicalgia: Secondary | ICD-10-CM | POA: Insufficient documentation

## 2014-07-11 DIAGNOSIS — Z5181 Encounter for therapeutic drug level monitoring: Secondary | ICD-10-CM | POA: Insufficient documentation

## 2014-07-11 DIAGNOSIS — M961 Postlaminectomy syndrome, not elsewhere classified: Secondary | ICD-10-CM | POA: Diagnosis not present

## 2014-07-11 DIAGNOSIS — Z9889 Other specified postprocedural states: Secondary | ICD-10-CM

## 2014-07-11 MED ORDER — OXYMORPHONE HCL ER 20 MG PO TB12: 20.0000 mg | ORAL_TABLET | Freq: Two times a day (BID) | ORAL | Status: DC

## 2014-07-11 MED ORDER — OXYCODONE-ACETAMINOPHEN 7.5-325 MG PO TABS: 1.0000 | ORAL_TABLET | ORAL | Status: DC | PRN

## 2014-07-11 NOTE — Progress Notes (Signed)
Subjective:    Patient ID: Bruce Olson, male    DOB: 03-30-1955, 59 y.o.   MRN: [id number]  HPI: Mr. SANDFORD DIOP is a 59 year old male who returns for follow up for chronic pain and medication refill. Marland Kitchen He says his pain is located in his lower back. He rates his pain 8. He's wearing his soft back brace. His current exercise regime is walking and performing stretching exercises.  Pain Inventory Average Pain 8 Pain Right Now 8 My pain is sharp, stabbing and tingling  In the last 24 hours, has pain interfered with the following? General activity 8 Relation with others 8 Enjoyment of life 8 What TIME of day is your pain at its worst? all Sleep (in general) Poor  Pain is worse with: walking, sitting and standing Pain improves with: medication and TENS Relief from Meds: 8  Mobility walk without assistance walk with assistance use a walker how many minutes can you walk? 15 ability to climb steps?  yes do you drive?  yes  Function disabled: date disabled .  Neuro/Psych weakness numbness tremor trouble walking  Prior Studies Any changes since last visit?  no  Physicians involved in your care Any changes since last visit?  no   Family History  Problem Relation Age of Onset  . Anesthesia problems Neg Hx   . Hypotension Neg Hx   . Malignant hyperthermia Neg Hx   . Pseudochol deficiency Neg Hx    History   Social History  . Marital Status: Single    Spouse Name: N/A  . Number of Children: N/A  . Years of Education: N/A   Social History Main Topics  . Smoking status: Current Every Day Smoker -- 1.50 packs/day for 33 years    Types: Cigarettes  . Smokeless tobacco: Never Used  . Alcohol Use: Yes     Comment: beer , q couple of months  . Drug Use: No  . Sexual Activity: No     Comment: back problems   Other Topics Concern  . None   Social History Narrative   Past Surgical History  Procedure Laterality Date  . Back surgery  2013    Back  surgery  (fusion)09/15/2010 & 2013  . Hardware removal N/A 03/14/2013    Procedure: HARDWARE REMOVAL;  Surgeon: Eustace Moore, MD;  Location: [place] [facility];  Service: Neurosurgery;  Laterality: N/A;  . Back surgery  03-2013   Past Medical History  Diagnosis Date  . No pertinent past medical history   . Occupational injury 2013    resulted in back injury that led to surgery   BP 129/66 mmHg  Pulse 64  Resp 14  SpO2 98%  Opioid Risk Score:   Fall Risk Score: Low Fall Risk (0-5 points)`1  Depression screen PHQ 2/9  Depression screen PHQ 2/9 04/21/2014  Decreased Interest 2  Down, Depressed, Hopeless 0  PHQ - 2 Score 2  Altered sleeping 3  Tired, decreased energy 3  Change in appetite 0  Feeling bad or failure about yourself  0  Trouble concentrating 2  Moving slowly or fidgety/restless 0  Suicidal thoughts 0  PHQ-9 Score 10     Review of Systems  Musculoskeletal: Positive for gait problem.  Neurological: Positive for tremors, weakness and numbness.  All other systems reviewed and are negative.      Objective:   Physical Exam  Constitutional: He is oriented to person, place, and time. He appears well-developed and  well-nourished.  HENT:  Head: Normocephalic and atraumatic.  Neck: Normal range of motion. Neck supple.  Cardiovascular: Normal rate and regular rhythm.   Pulmonary/Chest: Effort normal and breath sounds normal.  Musculoskeletal:  Normal Muscle Bulk and Muscle Testing Reveals: Upper Extremities: Full ROM and Muscle Strength 5/5 Lumbar Paraspinal Tenderness: L-1- L-4 Lower Extremities: Full ROM and Muscle Strength 5/5 Arises from chair with ease Narrow Based gait  Neurological: He is alert and oriented to person, place, and time.  Skin: Skin is warm and dry.  Psychiatric: He has a normal mood and affect.  Nursing note and vitals reviewed.         Assessment & Plan:  1. Lumbar postlaminectomy syndrome status post lumbar fusion x3: His most recent  surgery was 03/14/2013. He had hardware removal and bone allograft waist and L2-3.  Refilled: oxyCODONE 7.5/325mg  one tablet every 4 hours as needed #100 and OPANA 20 mg one tablet every 12 hours #60. Second script given to accommodate scheduled appointment. 2. Greater Trochanteric Bursitis: No Complaints Voiced Today. Continue with heat and exercise regimen.   15 minutes of face to face patient care time was spent during this visit. All questions were encouraged and answered.   F/U in 1 month

## 2014-08-13 ENCOUNTER — Telehealth: Payer: Self-pay | Admitting: *Deleted

## 2014-08-13 NOTE — Telephone Encounter (Signed)
I spoke with Bruce Olson and reviewed expectations about his medication.  [name] said that he could fill them early this one time but that at his visit on 08/20/14 he will be expected to have the correct pill count.  He is going to take his rx to the pharmacy tomorrow and they will call to get ok to fill.

## 2014-08-13 NOTE — Telephone Encounter (Signed)
Need someone to call pharmacy and allow his pain med to be refilled.  [name] wrote DNF until 08/15/14.  By [employer] he filled his last Rxs on 07/14/14, which would seem that today is due for refill. But in looking at his pill count on 07/11/14, he had #12 Opana ER (6 days) and #23 percocet.  He has been taking 1.5 tabs   3 x day he says. That is a total of 4.5 pills per day.  His Rx says he can take no more than 4 pills per day.  I have spoke with [name] and she will allow him to fill it this time but he is to not exceed 4 pills per day, and that is only on 10 days out of the month that he can do it and stay within his disp# of 100.  When he comes back  on 08/20/14, he should have approx  #51 opana ER and approx #73-75 percocet.(fill date 08/13/14, 3 pills day for 8 days =24 pills from 100 pills)

## 2014-08-20 ENCOUNTER — Encounter: Payer: Self-pay | Admitting: Registered Nurse

## 2014-08-20 ENCOUNTER — Encounter: Payer: Medicare Other | Attending: Physical Medicine and Rehabilitation | Admitting: Registered Nurse

## 2014-08-20 VITALS — BP 145/59 | HR 80 | Resp 16

## 2014-08-20 DIAGNOSIS — Z5181 Encounter for therapeutic drug level monitoring: Secondary | ICD-10-CM

## 2014-08-20 DIAGNOSIS — M5416 Radiculopathy, lumbar region: Secondary | ICD-10-CM

## 2014-08-20 DIAGNOSIS — Z9889 Other specified postprocedural states: Secondary | ICD-10-CM

## 2014-08-20 DIAGNOSIS — M961 Postlaminectomy syndrome, not elsewhere classified: Secondary | ICD-10-CM | POA: Diagnosis not present

## 2014-08-20 DIAGNOSIS — Z79899 Other long term (current) drug therapy: Secondary | ICD-10-CM | POA: Diagnosis not present

## 2014-08-20 DIAGNOSIS — M542 Cervicalgia: Secondary | ICD-10-CM | POA: Insufficient documentation

## 2014-08-20 MED ORDER — OXYCODONE-ACETAMINOPHEN 7.5-325 MG PO TABS: 1.0000 | ORAL_TABLET | ORAL | Status: DC | PRN

## 2014-08-20 MED ORDER — OXYMORPHONE HCL ER 20 MG PO TB12: 20.0000 mg | ORAL_TABLET | Freq: Two times a day (BID) | ORAL | Status: DC

## 2014-08-20 NOTE — Progress Notes (Signed)
Subjective:    Patient ID: Bruce Olson, male    DOB: 04-08-1955, 59 y.o.   MRN: [id number]  HPI: [name] is a 59 year old male who returns for follow up for chronic pain and medication refill. Marland Kitchen He says his pain is located in his lower back. He rates his pain 8. He's wearing his soft back brace. His current exercise regime is walking and performing stretching exercises.  Pain Inventory Average Pain 8 Pain Right Now 8 My pain is sharp, stabbing and tingling  In the last 24 hours, has pain interfered with the following? General activity 8 Relation with others 8 Enjoyment of life 8 What TIME of day is your pain at its worst? all Sleep (in general) Poor  Pain is worse with: walking, sitting and standing Pain improves with: medication and TENS Relief from Meds: 3  Reported "a little"  Mobility walk without assistance use a cane how many minutes can you walk? 5 ability to climb steps?  yes do you drive?  yes  Function disabled: date disabled 05/2010  Neuro/Psych weakness numbness tremor trouble walking  Prior Studies Any changes since last visit?  no  Physicians involved in your care Any changes since last visit?  no   Family History  Problem Relation Age of Onset  . Anesthesia problems Neg Hx   . Hypotension Neg Hx   . Malignant hyperthermia Neg Hx   . Pseudochol deficiency Neg Hx    History   Social History  . Marital Status: Single    Spouse Name: N/A  . Number of Children: N/A  . Years of Education: N/A   Social History Main Topics  . Smoking status: Current Every Day Smoker -- 1.50 packs/day for 33 years    Types: Cigarettes  . Smokeless tobacco: Never Used  . Alcohol Use: Yes     Comment: beer , q couple of months  . Drug Use: No  . Sexual Activity: No     Comment: back problems   Other Topics Concern  . None   Social History Narrative   Past Surgical History  Procedure Laterality Date  . Back surgery  2013    Back  surgery  (fusion)09/15/2010 & 2013  . Hardware removal N/A 03/14/2013    Procedure: HARDWARE REMOVAL;  Surgeon: Eustace Moore, MD;  Location: [place] [facility];  Service: Neurosurgery;  Laterality: N/A;  . Back surgery  03-2013   Past Medical History  Diagnosis Date  . No pertinent past medical history   . Occupational injury 2013    resulted in back injury that led to surgery   BP 145/59 mmHg  Pulse 80  Resp 16  SpO2 95%  Opioid Risk Score:   Fall Risk Score:  `1  Depression screen PHQ 2/9  Depression screen [facility] 2/9 08/20/2014 04/21/2014  Decreased Interest 2 2  Down, Depressed, Hopeless 0 0  PHQ - 2 Score 2 2  Altered sleeping - 3  Tired, decreased energy - 3  Change in appetite - 0  Feeling bad or failure about yourself  - 0  Trouble concentrating - 2  Moving slowly or fidgety/restless - 0  Suicidal thoughts - 0  PHQ-9 Score - 10     Review of Systems  Musculoskeletal: Positive for gait problem.  Neurological: Positive for tremors, weakness and numbness.  All other systems reviewed and are negative.      Objective:   Physical Exam  Constitutional: He is  oriented to person, place, and time. He appears well-developed and well-nourished.  HENT:  Head: Normocephalic and atraumatic.  Neck: Normal range of motion. Neck supple.  Cardiovascular: Normal rate and regular rhythm.   Pulmonary/Chest: Effort normal and breath sounds normal.  Musculoskeletal:  Normal Muscle Bulk and Muscle Testing Reveals: Upper Extremities: Full ROM and Muscle Strength 5/5 Thoracic and Lumbar Hypersensitivity Lower Extremities: Full ROM and Muscle Strength 5/5 Arises from chair with ease  Neurological: He is alert and oriented to person, place, and time.  Skin: Skin is warm and dry.  Psychiatric: He has a normal mood and affect.  Nursing note and vitals reviewed.         Assessment & Plan:  1. Lumbar postlaminectomy syndrome status post lumbar fusion x3: His most recent surgery was  03/14/2013. He had hardware removal and bone allograft waist and L2-3.  Refilled: oxyCODONE 7.5/325mg  one tablet every 4 hours as needed #100 and OPANA 20 mg one tablet every 12 hours #60.  2. Greater Trochanteric Bursitis: No Complaints Voiced Today. Continue with heat and exercise regimen.   15 minutes of face to face patient care time was spent during this visit. All questions were encouraged and answered.   F/U in 1 month

## 2014-09-22 ENCOUNTER — Encounter: Payer: Medicare Other | Attending: Physical Medicine and Rehabilitation | Admitting: Registered Nurse

## 2014-09-22 ENCOUNTER — Encounter: Payer: Self-pay | Admitting: Registered Nurse

## 2014-09-22 VITALS — BP 132/66 | HR 81

## 2014-09-22 DIAGNOSIS — M5416 Radiculopathy, lumbar region: Secondary | ICD-10-CM | POA: Insufficient documentation

## 2014-09-22 DIAGNOSIS — M961 Postlaminectomy syndrome, not elsewhere classified: Secondary | ICD-10-CM | POA: Diagnosis not present

## 2014-09-22 DIAGNOSIS — M542 Cervicalgia: Secondary | ICD-10-CM | POA: Insufficient documentation

## 2014-09-22 DIAGNOSIS — Z5181 Encounter for therapeutic drug level monitoring: Secondary | ICD-10-CM

## 2014-09-22 DIAGNOSIS — Z79899 Other long term (current) drug therapy: Secondary | ICD-10-CM | POA: Diagnosis not present

## 2014-09-22 DIAGNOSIS — Z9889 Other specified postprocedural states: Secondary | ICD-10-CM

## 2014-09-22 MED ORDER — OXYCODONE-ACETAMINOPHEN 7.5-325 MG PO TABS: 1.0000 | ORAL_TABLET | ORAL | Status: DC | PRN

## 2014-09-22 MED ORDER — OXYMORPHONE HCL ER 20 MG PO TB12: 20.0000 mg | ORAL_TABLET | Freq: Two times a day (BID) | ORAL | Status: DC

## 2014-09-22 NOTE — Progress Notes (Signed)
Subjective:    Patient ID: Bruce Olson, male    DOB: 07-Nov-1955, 59 y.o.   MRN: [id number]  HPI: Bruce Olson is a 59 year old male who returns for follow up for chronic pain and medication refill. Marland Kitchen He says his pain is located in his lower back radiating into his right hip.Marland Kitchen He rates his pain 8. He's wearing his soft back brace. His current exercise regime is walking and performing stretching exercises.  Pain Inventory Average Pain 8 Pain Right Now 8 My pain is sharp, stabbing and tingling  In the last 24 hours, has pain interfered with the following? General activity 8 Relation with others 8 Enjoyment of life 8 What TIME of day is your pain at its worst? morning, daytime, evening, night Sleep (in general) Poor  Pain is worse with: walking, sitting and standing Pain improves with: medication and TENS Relief from Meds: 8  Mobility walk without assistance use a walker ability to climb steps?  yes do you drive?  yes  Function disabled: date disabled 05/2010  Neuro/Psych weakness numbness tremor trouble walking  Prior Studies Any changes since last visit?  no x-rays CT/MRI  Physicians involved in your care Any changes since last visit?  no Neurologist Dr. Sherley Bounds Neurosurgeon na   Family History  Problem Relation Age of Onset  . Anesthesia problems Neg Hx   . Hypotension Neg Hx   . Malignant hyperthermia Neg Hx   . Pseudochol deficiency Neg Hx    Social History   Social History  . Marital Status: Single    Spouse Name: N/A  . Number of Children: N/A  . Years of Education: N/A   Social History Main Topics  . Smoking status: Current Every Day Smoker -- 1.50 packs/day for 33 years    Types: Cigarettes  . Smokeless tobacco: Never Used  . Alcohol Use: Yes     Comment: beer , q couple of months  . Drug Use: No  . Sexual Activity: No     Comment: back problems   Other Topics Concern  . None   Social History Narrative   Past  Surgical History  Procedure Laterality Date  . Back surgery  2013    Back surgery  (fusion)09/15/2010 & 2013  . Hardware removal N/A 03/14/2013    Procedure: HARDWARE REMOVAL;  Surgeon: Eustace Moore, MD;  Location: [place] [facility];  Service: Neurosurgery;  Laterality: N/A;  . Back surgery  03-2013   Past Medical History  Diagnosis Date  . No pertinent past medical history   . Occupational injury 2013    resulted in back injury that led to surgery   BP 132/66 mmHg  Pulse 81  SpO2 96%  Opioid Risk Score:   Fall Risk Score:  `1  Depression screen PHQ 2/9  Depression screen [facility] 2/9 09/22/2014 08/20/2014 04/21/2014  Decreased Interest 0 2 2  Down, Depressed, Hopeless 0 0 0  PHQ - 2 Score 0 2 2  Altered sleeping - - 3  Tired, decreased energy - - 3  Change in appetite - - 0  Feeling bad or failure about yourself  - - 0  Trouble concentrating - - 2  Moving slowly or fidgety/restless - - 0  Suicidal thoughts - - 0  PHQ-9 Score - - 10     Review of Systems  All other systems reviewed and are negative.      Objective:   Physical Exam  Constitutional: He  is oriented to person, place, and time. He appears well-developed and well-nourished.  HENT:  Head: Normocephalic and atraumatic.  Neck: Normal range of motion. Neck supple.  Cardiovascular: Normal rate and regular rhythm.   Pulmonary/Chest: Effort normal and breath sounds normal.  Musculoskeletal:  Normal Muscle Bulk and Muscle Testing Reveals: Upper Extremities: Full ROM and Muscle Strength 5/5 Lumbar Paraspinal Tenderness: L-1-L-4 Lower Extremities: Full ROM and Muscle Strength 5/5 Arises from chair with ease Narrow Based gait  Neurological: He is alert and oriented to person, place, and time.  Skin: Skin is warm and dry.  Psychiatric: He has a normal mood and affect.  Nursing note and vitals reviewed.         Assessment & Plan:  1. Lumbar postlaminectomy syndrome status post lumbar fusion x3: His most recent  surgery was 03/14/2013. He had hardware removal and bone allograft waist and L2-3.  Refilled: oxyCODONE 7.5/325mg  one tablet every 4 hours as needed #100 and OPANA 20 mg one tablet every 12 hours #60.  2. Greater Trochanteric Bursitis: No Complaints Voiced Today. Continue with heat and exercise regimen.   15 minutes of face to face patient care time was spent during this visit. All questions were encouraged and answered.   F/U in 1 month

## 2014-10-30 ENCOUNTER — Other Ambulatory Visit: Payer: Self-pay | Admitting: Physical Medicine & Rehabilitation

## 2014-10-30 ENCOUNTER — Encounter: Payer: Self-pay | Admitting: Registered Nurse

## 2014-10-30 ENCOUNTER — Encounter: Payer: Medicare Other | Attending: Physical Medicine and Rehabilitation | Admitting: Registered Nurse

## 2014-10-30 VITALS — BP 128/68 | HR 60 | Resp 14

## 2014-10-30 DIAGNOSIS — M5416 Radiculopathy, lumbar region: Secondary | ICD-10-CM

## 2014-10-30 DIAGNOSIS — Z5181 Encounter for therapeutic drug level monitoring: Secondary | ICD-10-CM

## 2014-10-30 DIAGNOSIS — Z9889 Other specified postprocedural states: Secondary | ICD-10-CM | POA: Diagnosis not present

## 2014-10-30 DIAGNOSIS — M542 Cervicalgia: Secondary | ICD-10-CM | POA: Insufficient documentation

## 2014-10-30 DIAGNOSIS — M961 Postlaminectomy syndrome, not elsewhere classified: Secondary | ICD-10-CM

## 2014-10-30 DIAGNOSIS — Z79899 Other long term (current) drug therapy: Secondary | ICD-10-CM | POA: Diagnosis not present

## 2014-10-30 MED ORDER — OXYCODONE-ACETAMINOPHEN 7.5-325 MG PO TABS: 1.0000 | ORAL_TABLET | ORAL | Status: DC | PRN

## 2014-10-30 MED ORDER — OXYMORPHONE HCL ER 20 MG PO TB12: 20.0000 mg | ORAL_TABLET | Freq: Two times a day (BID) | ORAL | Status: DC

## 2014-10-30 MED ORDER — PREGABALIN 100 MG PO CAPS: 100.0000 mg | ORAL_CAPSULE | Freq: Three times a day (TID) | ORAL | Status: DC

## 2014-10-30 NOTE — Progress Notes (Signed)
Subjective:    Patient ID: Bruce Olson, male    DOB: Jun 21, 1955, 59 y.o.   MRN: [id number]  HPI: [name] is a 59 year old male who returns for follow up for chronic pain and medication refill. Marland Kitchen He says his pain is located in his mid- lower back. He rates his pain 8. He's wearing his soft back brace. His current exercise regime is walking and performing stretching exercises.  Pain Inventory Average Pain 8 Pain Right Now 8 My pain is sharp, stabbing and tingling  In the last 24 hours, has pain interfered with the following? General activity 8 Relation with others 8 Enjoyment of life 8 What TIME of day is your pain at its worst? all Sleep (in general) Poor  Pain is worse with: walking, sitting and standing Pain improves with: medication and TENS Relief from Meds: 8  Mobility walk without assistance use a cane how many minutes can you walk? 10 ability to climb steps?  yes do you drive?  yes  Function disabled: date disabled .  Neuro/Psych weakness numbness tremor trouble walking  Prior Studies Any changes since last visit?  no  Physicians involved in your care Any changes since last visit?  no   Family History  Problem Relation Age of Onset  . Anesthesia problems Neg Hx   . Hypotension Neg Hx   . Malignant hyperthermia Neg Hx   . Pseudochol deficiency Neg Hx    Social History   Social History  . Marital Status: Single    Spouse Name: N/A  . Number of Children: N/A  . Years of Education: N/A   Social History Main Topics  . Smoking status: Current Every Day Smoker -- 1.50 packs/day for 33 years    Types: Cigarettes  . Smokeless tobacco: Never Used  . Alcohol Use: Yes     Comment: beer , q couple of months  . Drug Use: No  . Sexual Activity: No     Comment: back problems   Other Topics Concern  . None   Social History Narrative   Past Surgical History  Procedure Laterality Date  . Back surgery  2013    Back surgery   (fusion)09/15/2010 & 2013  . Hardware removal N/A 03/14/2013    Procedure: HARDWARE REMOVAL;  Surgeon: Eustace Moore, MD;  Location: [place] [facility];  Service: Neurosurgery;  Laterality: N/A;  . Back surgery  03-2013   Past Medical History  Diagnosis Date  . No pertinent past medical history   . Occupational injury 2013    resulted in back injury that led to surgery   BP 128/68 mmHg  Pulse 58  Resp 14  SpO2 100%  Opioid Risk Score:   Fall Risk Score:  `1  Depression screen PHQ 2/9  Depression screen [facility] 2/9 09/22/2014 08/20/2014 04/21/2014  Decreased Interest 0 2 2  Down, Depressed, Hopeless 0 0 0  PHQ - 2 Score 0 2 2  Altered sleeping - - 3  Tired, decreased energy - - 3  Change in appetite - - 0  Feeling bad or failure about yourself  - - 0  Trouble concentrating - - 2  Moving slowly or fidgety/restless - - 0  Suicidal thoughts - - 0  PHQ-9 Score - - 10     Review of Systems  Musculoskeletal: Positive for gait problem.  Neurological: Positive for tremors, weakness and numbness.  All other systems reviewed and are negative.  Objective:   Physical Exam  Constitutional: He is oriented to person, place, and time. He appears well-developed and well-nourished.  HENT:  Head: Normocephalic and atraumatic.  Neck: Normal range of motion. Neck supple.  Cardiovascular: Normal rate and regular rhythm.   Pulmonary/Chest: Effort normal and breath sounds normal.  Musculoskeletal:  Normal Muscle Bulk and Muscle Testing Reveals: Upper Extremities: Full ROM and Muscle Strength 5/5 Thoracic Paraspinal Tenderness: T-11- T-12 Lumbar Paraspinal Tenderness: L-2- L-4 Lower Extremities: Full ROM and Muscle Strength 5/5 Arises from chair with ease Narrow Based Gait  Neurological: He is alert and oriented to person, place, and time.  Skin: Skin is warm and dry.  Psychiatric: He has a normal mood and affect.  Nursing note and vitals reviewed.         Assessment & Plan:  1.  Lumbar postlaminectomy syndrome status post lumbar fusion x3: His most recent surgery was 03/14/2013. He had hardware removal and bone allograft waist and L2-3.  Refilled: oxyCODONE 7.5/325mg  one tablet every 4 hours as needed #100 and OPANA 20 mg one tablet every 12 hours #60.  2. Greater Trochanteric Bursitis: No Complaints Voiced Today. Continue with heat and exercise regimen.   15 minutes of face to face patient care time was spent during this visit. All questions were encouraged and answered.   F/U in 1 month

## 2014-10-31 LAB — PMP ALCOHOL METABOLITE (ETG): Ethyl Glucuronide (EtG): NEGATIVE ng/mL

## 2014-11-03 LAB — OPIATES/OPIOIDS (LC/MS-MS)
Codeine Urine: NEGATIVE ng/mL (ref <50)
HYDROCODONE: NEGATIVE ng/mL (ref <50)
HYDROMORPHONE: NEGATIVE ng/mL (ref <50)
MORPHINE: NEGATIVE ng/mL (ref <50)
NORHYDROCODONE, UR: NEGATIVE ng/mL (ref <50)
NOROXYCODONE, UR: 3246 ng/mL (ref <50)
OXYCODONE, UR: 2219 ng/mL (ref <50)
Oxymorphone: 11624 ng/mL (ref <50)

## 2014-11-03 LAB — OXYCODONE, URINE (LC/MS-MS)
Noroxycodone, Ur: 3246 ng/mL (ref <50)
Oxycodone, ur: 2219 ng/mL (ref <50)
Oxymorphone: 11624 ng/mL (ref <50)

## 2014-11-04 LAB — PRESCRIPTION MONITORING PROFILE (SOLSTAS)
AMPHETAMINE/METH: NEGATIVE ng/mL
Barbiturate Screen, Urine: NEGATIVE ng/mL
Benzodiazepine Screen, Urine: NEGATIVE ng/mL
Buprenorphine, Urine: NEGATIVE ng/mL
CANNABINOID SCRN UR: NEGATIVE ng/mL
Carisoprodol, Urine: NEGATIVE ng/mL
Cocaine Metabolites: NEGATIVE ng/mL
Creatinine, Urine: 45.41 mg/dL (ref >20.0)
FENTANYL URINE: NEGATIVE ng/mL
MDMA URINE: NEGATIVE ng/mL
METHADONE SCREEN, URINE: NEGATIVE ng/mL
Meperidine, Ur: NEGATIVE ng/mL
NITRITES URINE, INITIAL: NEGATIVE ug/mL
PROPOXYPHENE: NEGATIVE ng/mL
TAPENTADOLUR: NEGATIVE ng/mL
TRAMADOL UR: NEGATIVE ng/mL
Zolpidem, Urine: NEGATIVE ng/mL
pH, Initial: 5.1 pH (ref 4.5–8.9)

## 2014-11-13 NOTE — Progress Notes (Signed)
Urine drug screen for this encounter is consistent for prescribed medication 

## 2014-11-28 ENCOUNTER — Encounter: Payer: Self-pay | Admitting: Registered Nurse

## 2014-11-28 ENCOUNTER — Encounter: Payer: Medicare Other | Attending: Physical Medicine and Rehabilitation | Admitting: Registered Nurse

## 2014-11-28 VITALS — BP 124/62 | HR 64

## 2014-11-28 DIAGNOSIS — Z79899 Other long term (current) drug therapy: Secondary | ICD-10-CM

## 2014-11-28 DIAGNOSIS — M961 Postlaminectomy syndrome, not elsewhere classified: Secondary | ICD-10-CM | POA: Diagnosis not present

## 2014-11-28 DIAGNOSIS — Z9889 Other specified postprocedural states: Secondary | ICD-10-CM

## 2014-11-28 DIAGNOSIS — Z5181 Encounter for therapeutic drug level monitoring: Secondary | ICD-10-CM

## 2014-11-28 DIAGNOSIS — M542 Cervicalgia: Secondary | ICD-10-CM | POA: Diagnosis not present

## 2014-11-28 DIAGNOSIS — M5416 Radiculopathy, lumbar region: Secondary | ICD-10-CM | POA: Diagnosis not present

## 2014-11-28 MED ORDER — OXYMORPHONE HCL ER 20 MG PO TB12: 20.0000 mg | ORAL_TABLET | Freq: Two times a day (BID) | ORAL | Status: DC

## 2014-11-28 MED ORDER — OXYCODONE-ACETAMINOPHEN 7.5-325 MG PO TABS: 1.0000 | ORAL_TABLET | ORAL | Status: DC | PRN

## 2014-11-28 NOTE — Progress Notes (Signed)
Subjective:    Patient ID: Bruce Olson, male    DOB: 1955-09-06, 59 y.o.   MRN: [id number]  HPI: [name] is a 59 year old male who returns for follow up for chronic pain and medication refill. Marland Kitchen He says his pain is located in his lower back. He rates his pain 8. He's wearing his soft back brace. His current exercise regime is walking and performing stretching exercises.  Pain Inventory Average Pain 8 Pain Right Now 8 My pain is sharp, stabbing and tingling  In the last 24 hours, has pain interfered with the following? General activity 8 Relation with others 8 Enjoyment of life 8 What TIME of day is your pain at its worst? Morning, Daytime, Evening and Night Sleep (in general) Poor  Pain is worse with: walking, sitting and standing Pain improves with: medication and TENS Relief from Meds: 8  Mobility walk without assistance walk with assistance use a cane how many minutes can you walk? 15 ability to climb steps?  yes do you drive?  yes  Function disabled: date disabled 05/2010  Neuro/Psych weakness numbness tremor trouble walking  Prior Studies Any changes since last visit?  no  Physicians involved in your care Any changes since last visit?  no   Family History  Problem Relation Age of Onset  . Anesthesia problems Neg Hx   . Hypotension Neg Hx   . Malignant hyperthermia Neg Hx   . Pseudochol deficiency Neg Hx    Social History   Social History  . Marital Status: Single    Spouse Name: N/A  . Number of Children: N/A  . Years of Education: N/A   Social History Main Topics  . Smoking status: Current Every Day Smoker -- 1.50 packs/day for 33 years    Types: Cigarettes  . Smokeless tobacco: Never Used  . Alcohol Use: Yes     Comment: beer , q couple of months  . Drug Use: No  . Sexual Activity: No     Comment: back problems   Other Topics Concern  . None   Social History Narrative   Past Surgical History  Procedure  Laterality Date  . Back surgery  2013    Back surgery  (fusion)09/15/2010 & 2013  . Hardware removal N/A 03/14/2013    Procedure: HARDWARE REMOVAL;  Surgeon: Bruce Moore, MD;  Location: [place] [facility];  Service: Neurosurgery;  Laterality: N/A;  . Back surgery  03-2013   Past Medical History  Diagnosis Date  . No pertinent past medical history   . Occupational injury 2013    resulted in back injury that led to surgery   BP 124/62 mmHg  Pulse 64  SpO2 97%  Opioid Risk Score:   Fall Risk Score:  `1  Depression screen PHQ 2/9  Depression screen [facility] 2/9 09/22/2014 08/20/2014 04/21/2014  Decreased Interest 0 2 2  Down, Depressed, Hopeless 0 0 0  PHQ - 2 Score 0 2 2  Altered sleeping - - 3  Tired, decreased energy - - 3  Change in appetite - - 0  Feeling bad or failure about yourself  - - 0  Trouble concentrating - - 2  Moving slowly or fidgety/restless - - 0  Suicidal thoughts - - 0  PHQ-9 Score - - 10     Review of Systems  Neurological: Positive for tremors, weakness and numbness.       Gait Instability  All other systems reviewed and are  negative.      Objective:   Physical Exam  Constitutional: He is oriented to person, place, and time. He appears well-developed and well-nourished.  HENT:  Head: Normocephalic and atraumatic.  Neck: Normal range of motion. Neck supple.  Cardiovascular: Normal rate and regular rhythm.   Pulmonary/Chest: Effort normal and breath sounds normal.  Musculoskeletal:  Normal Muscle Bulk and Muscle Testing Reveals: Upper Extremities: Full ROM and Muscle Strength 5/5 Thoracic Paraspinal Tenderness: T-10- T-12 Lumbar Paraspinal Tenderness: L-1- L-4 Lower Extremities: Full ROM and Muscle Strength 5/5 Arises from Table with Ease Narrow Based Gait   Neurological: He is alert and oriented to person, place, and time.  Skin: Skin is warm and dry.  Psychiatric: He has a normal mood and affect.  Nursing note and vitals reviewed.           Assessment & Plan:  1. Lumbar postlaminectomy syndrome status post lumbar fusion x3: His most recent surgery was 03/14/2013. He had hardware removal and bone allograft waist and L2-3.  Refilled: oxyCODONE 7.5/325mg  one tablet every 4 hours as needed #100 and OPANA 20 mg one tablet every 12 hours #60.  2. Greater Trochanteric Bursitis: No Complaints Voiced Today. Continue with heat and exercise regimen.   15 minutes of face to face patient care time was spent during this visit. All questions were encouraged and answered.   F/U in 1 month

## 2014-12-10 DIAGNOSIS — K1370 Unspecified lesions of oral mucosa: Secondary | ICD-10-CM | POA: Diagnosis not present

## 2014-12-29 ENCOUNTER — Encounter: Payer: Self-pay | Admitting: Physical Medicine & Rehabilitation

## 2014-12-29 ENCOUNTER — Encounter: Payer: Medicare Other | Attending: Physical Medicine and Rehabilitation

## 2014-12-29 ENCOUNTER — Ambulatory Visit (HOSPITAL_BASED_OUTPATIENT_CLINIC_OR_DEPARTMENT_OTHER): Payer: Medicare Other | Admitting: Physical Medicine & Rehabilitation

## 2014-12-29 VITALS — BP 130/71 | HR 61 | Resp 14

## 2014-12-29 DIAGNOSIS — M961 Postlaminectomy syndrome, not elsewhere classified: Secondary | ICD-10-CM | POA: Insufficient documentation

## 2014-12-29 DIAGNOSIS — Z79899 Other long term (current) drug therapy: Secondary | ICD-10-CM | POA: Diagnosis not present

## 2014-12-29 DIAGNOSIS — M542 Cervicalgia: Secondary | ICD-10-CM | POA: Insufficient documentation

## 2014-12-29 DIAGNOSIS — Z5181 Encounter for therapeutic drug level monitoring: Secondary | ICD-10-CM | POA: Diagnosis not present

## 2014-12-29 DIAGNOSIS — M5416 Radiculopathy, lumbar region: Secondary | ICD-10-CM | POA: Diagnosis not present

## 2014-12-29 DIAGNOSIS — Z9889 Other specified postprocedural states: Secondary | ICD-10-CM | POA: Insufficient documentation

## 2014-12-29 MED ORDER — OXYMORPHONE HCL ER 20 MG PO TB12: 20.0000 mg | ORAL_TABLET | Freq: Two times a day (BID) | ORAL | Status: DC

## 2014-12-29 MED ORDER — OXYCODONE-ACETAMINOPHEN 7.5-325 MG PO TABS: 1.0000 | ORAL_TABLET | ORAL | Status: DC | PRN

## 2014-12-29 NOTE — Progress Notes (Signed)
Subjective:    Patient ID: Bruce Olson, male    DOB: [birth year], 59 y.o.   MRN: [id number]  HPI 59 year old male with history of lumbar spinal stenosis. He underwent L2-S1 fusio in 2013. He had loosening of the L2  Screws as well as right L4 screw. Had screw removal Continues have chronic low back pain. 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  No new medical issues. We discussed that he needs a primary care physician. Pain Inventory Average Pain 8 Pain Right Now 8 My pain is sharp, stabbing and tingling  In the last 24 hours, has pain interfered with the following? General activity 8 Relation with others 8 Enjoyment of life 8 What TIME of day is your pain at its worst? all Sleep (in general) Poor  Pain is worse with: walking, sitting and standing Pain improves with: medication and TENS Relief from Meds: 8  Mobility walk without assistance use a cane how many minutes can you walk? 10 ability to climb steps?  yes do you drive?  yes  Function disabled: date disabled .  Neuro/Psych weakness numbness tremor  Prior Studies Any changes since last visit?  no  Physicians involved in your care Any changes since last visit?  no   Family History  Problem Relation Age of Onset  . Anesthesia problems Neg Hx   . Hypotension Neg Hx   . Malignant hyperthermia Neg Hx   . Pseudochol deficiency Neg Hx    Social History   Social History  . Marital Status: Single    Spouse Name: N/A  . Number of Children: N/A  . Years of Education: N/A   Social History Main Topics  . Smoking status: Current Every Day Smoker -- 1.50 packs/day for 33 years    Types: Cigarettes  . Smokeless tobacco: Never Used  . Alcohol Use: Yes     Comment: beer , q couple of months  . Drug Use: No    . Sexual Activity: No     Comment: back problems   Other Topics Concern  . None   Social History Narrative   Past Surgical History  Procedure Laterality Date  . Back surgery  2013    Back surgery  (fusion)09/15/2010 & 2013  . Hardware removal N/A 03/14/2013    Procedure: HARDWARE REMOVAL;  Surgeon: Eustace Moore, MD;  Location: [facility];  Service: Neurosurgery;  Laterality: N/A;  . Back surgery  03-2013   Past Medical History  Diagnosis Date  . No pertinent past medical history   . Occupational injury 2013    resulted in back injury that led to surgery   BP 130/71 mmHg  Pulse 61  Resp 14  SpO2 99%  Opioid Risk Score:   Fall Risk Score:  `1  Depression screen PHQ 2/9  Depression screen [facility] 2/9 09/22/2014 08/20/2014 04/21/2014  Decreased Interest 0 2 2  Down, Depressed, Hopeless 0 0 0  PHQ - 2 Score 0 2 2  Altered sleeping - - 3  Tired, decreased energy - - 3  Change in appetite - - 0  Feeling bad or failure about yourself  - - 0  Trouble concentrating - - 2  Moving slowly or fidgety/restless - - 0  Suicidal thoughts - - 0  PHQ-9 Score - - 10  Review of Systems  Neurological: Positive for tremors, weakness and numbness.  All other systems reviewed and are negative.      Objective:   Physical Exam  Constitutional: He is oriented to person, place, and time. He appears well-developed and well-nourished.  HENT:  Head: Normocephalic and atraumatic.  Eyes: Conjunctivae and EOM are normal. Pupils are equal, round, and reactive to light.  Neck: Normal range of motion.  Neurological: He is alert and oriented to person, place, and time.  Reflex Scores:      Patellar reflexes are 2+ on the right side and 2+ on the left side.      Achilles reflexes are 2+ on the right side and 2+ on the left side. Negative straight leg raising  Psychiatric: He has a normal mood and affect.  Nursing note and vitals reviewed.   Lumbar spine has limited range of motion with  flexion extension lateral bending and rotation. Negative straight leg raising Normal strength bilateral hip flexors knee extensors ankle dorsiflexors. Deep tendon reflexes 2+ bilateral patellar and Achilles      Assessment & Plan:   1. Chronic Lumbar post laminectomy syndromeWith both axial pain and neurogenic pain affecting the right lower extremity. Continue current medications including Opana ER 20 mg twice a day, 60 tablets per month Oxycodone 7.5 mg 3 times a day, Occasionally 4 times a day. 100 tablets to last for one month Lyrica 100 mg 3 times a day, 90 tablets per month with refills Last urine drug screen 10/30/2014 was consistent Continue opioid monitoring program. This consists of regular clinic visits, examinations, urine drug screen, pill counts as well as use of [place] controlled substance reporting System.  Practitioner visit one month

## 2014-12-29 NOTE — Patient Instructions (Signed)
Please make an appointment with a primary care physician

## 2015-01-27 ENCOUNTER — Encounter: Payer: Self-pay | Admitting: Registered Nurse

## 2015-01-27 ENCOUNTER — Encounter: Payer: Medicare Other | Attending: Physical Medicine and Rehabilitation | Admitting: Registered Nurse

## 2015-01-27 VITALS — BP 122/69 | HR 64

## 2015-01-27 DIAGNOSIS — M542 Cervicalgia: Secondary | ICD-10-CM | POA: Diagnosis not present

## 2015-01-27 DIAGNOSIS — Z79899 Other long term (current) drug therapy: Secondary | ICD-10-CM

## 2015-01-27 DIAGNOSIS — M961 Postlaminectomy syndrome, not elsewhere classified: Secondary | ICD-10-CM

## 2015-01-27 DIAGNOSIS — Z9889 Other specified postprocedural states: Secondary | ICD-10-CM | POA: Diagnosis not present

## 2015-01-27 DIAGNOSIS — Z5181 Encounter for therapeutic drug level monitoring: Secondary | ICD-10-CM | POA: Diagnosis not present

## 2015-01-27 DIAGNOSIS — M5416 Radiculopathy, lumbar region: Secondary | ICD-10-CM | POA: Diagnosis not present

## 2015-01-27 MED ORDER — OXYMORPHONE HCL ER 20 MG PO TB12: 20.0000 mg | ORAL_TABLET | Freq: Two times a day (BID) | ORAL | Status: DC

## 2015-01-27 MED ORDER — OXYCODONE-ACETAMINOPHEN 7.5-325 MG PO TABS: 1.0000 | ORAL_TABLET | ORAL | Status: DC | PRN

## 2015-01-27 NOTE — Progress Notes (Signed)
Subjective:    Patient ID: Bruce Olson, male    DOB: May 08, 1955, 59 y.o.   MRN: [id number]  HPI: [name] is a 59 year old male who returns for follow up for chronic pain and medication refill. Marland Kitchen He says his pain is located in his lower back. He rates his pain 8.His current exercise regime is walking and performing stretching exercises.  Pain Inventory Average Pain 8 Pain Right Now 8 My pain is sharp, stabbing and tingling  In the last 24 hours, has pain interfered with the following? General activity 8 Relation with others 8 Enjoyment of life 8 What TIME of day is your pain at its worst? all Sleep (in general) Poor  Pain is worse with: walking, sitting and standing Pain improves with: medication and TENS Relief from Meds: 8  Mobility walk without assistance use a cane how many minutes can you walk? 15 ability to climb steps?  yes do you drive?  yes  Function disabled: date disabled 2012  Neuro/Psych weakness numbness tremor trouble walking  Prior Studies Any changes since last visit?  no  Physicians involved in your care Any changes since last visit?  no   Family History  Problem Relation Age of Onset  . Anesthesia problems Neg Hx   . Hypotension Neg Hx   . Malignant hyperthermia Neg Hx   . Pseudochol deficiency Neg Hx    Social History   Social History  . Marital Status: Single    Spouse Name: N/A  . Number of Children: N/A  . Years of Education: N/A   Social History Main Topics  . Smoking status: Current Every Day Smoker -- 1.50 packs/day for 33 years    Types: Cigarettes  . Smokeless tobacco: Never Used  . Alcohol Use: Yes     Comment: beer , q couple of months  . Drug Use: No  . Sexual Activity: No     Comment: back problems   Other Topics Concern  . None   Social History Narrative   Past Surgical History  Procedure Laterality Date  . Back surgery  2013    Back surgery  (fusion)09/15/2010 & 2013  . Hardware  removal N/A 03/14/2013    Procedure: HARDWARE REMOVAL;  Surgeon: Eustace Moore, MD;  Location: [place] [facility];  Service: Neurosurgery;  Laterality: N/A;  . Back surgery  03-2013   Past Medical History  Diagnosis Date  . No pertinent past medical history   . Occupational injury 2013    resulted in back injury that led to surgery   BP 122/69 mmHg  Pulse 64  SpO2 97%  Opioid Risk Score:   Fall Risk Score:  `1  Depression screen PHQ 2/9  Depression screen [facility] 2/9 09/22/2014 08/20/2014 04/21/2014  Decreased Interest 0 2 2  Down, Depressed, Hopeless 0 0 0  PHQ - 2 Score 0 2 2  Altered sleeping - - 3  Tired, decreased energy - - 3  Change in appetite - - 0  Feeling bad or failure about yourself  - - 0  Trouble concentrating - - 2  Moving slowly or fidgety/restless - - 0  Suicidal thoughts - - 0  PHQ-9 Score - - 10     Review of Systems  All other systems reviewed and are negative.      Objective:   Physical Exam  Constitutional: He is oriented to person, place, and time. He appears well-developed and well-nourished.  HENT:  Head:  Normocephalic and atraumatic.  Neck: Normal range of motion. Neck supple.  Cardiovascular: Normal rate and regular rhythm.   Pulmonary/Chest: Effort normal.  Musculoskeletal:  Normal Muscle Bulk and Muscle Testing Reveals: Upper Extremities: Full ROM and Muscle Strength 5/5 Lumbar Paraspinal Tenderness: L-3- L-5 Lower Extremities: Full ROM and Muscle Strength 5/5 Arises from chair with ease Narrow Based gait  Neurological: He is alert and oriented to person, place, and time.  Skin: Skin is warm and dry.  Psychiatric: He has a normal mood and affect.  Nursing note and vitals reviewed.         Assessment & Plan:  1. Lumbar postlaminectomy syndrome status post lumbar fusion x3: His most recent surgery was 03/14/2013. He had hardware removal and bone allograft waist and L2-3.  Refilled: oxyCODONE 7.5/325mg  one tablet every 4 hours as needed  #100 and OPANA 20 mg one tablet every 12 hours #60.  2. Greater Trochanteric Bursitis: No Complaints Voiced Today. Continue with heat and exercise regimen.   15 minutes of face to face patient care time was spent during this visit. All questions were encouraged and answered.   F/U in 1 month

## 2015-02-03 IMAGING — CR DG CHEST 2V
2 series · 2 of 2 positions shown · non-contrast
Comparison: 09/13/2010.

CLINICAL DATA: Preoperative respiratory evaluation prior to lumbar
spine surgery.

EXAM:
CHEST  2 VIEW

[w chest pa]
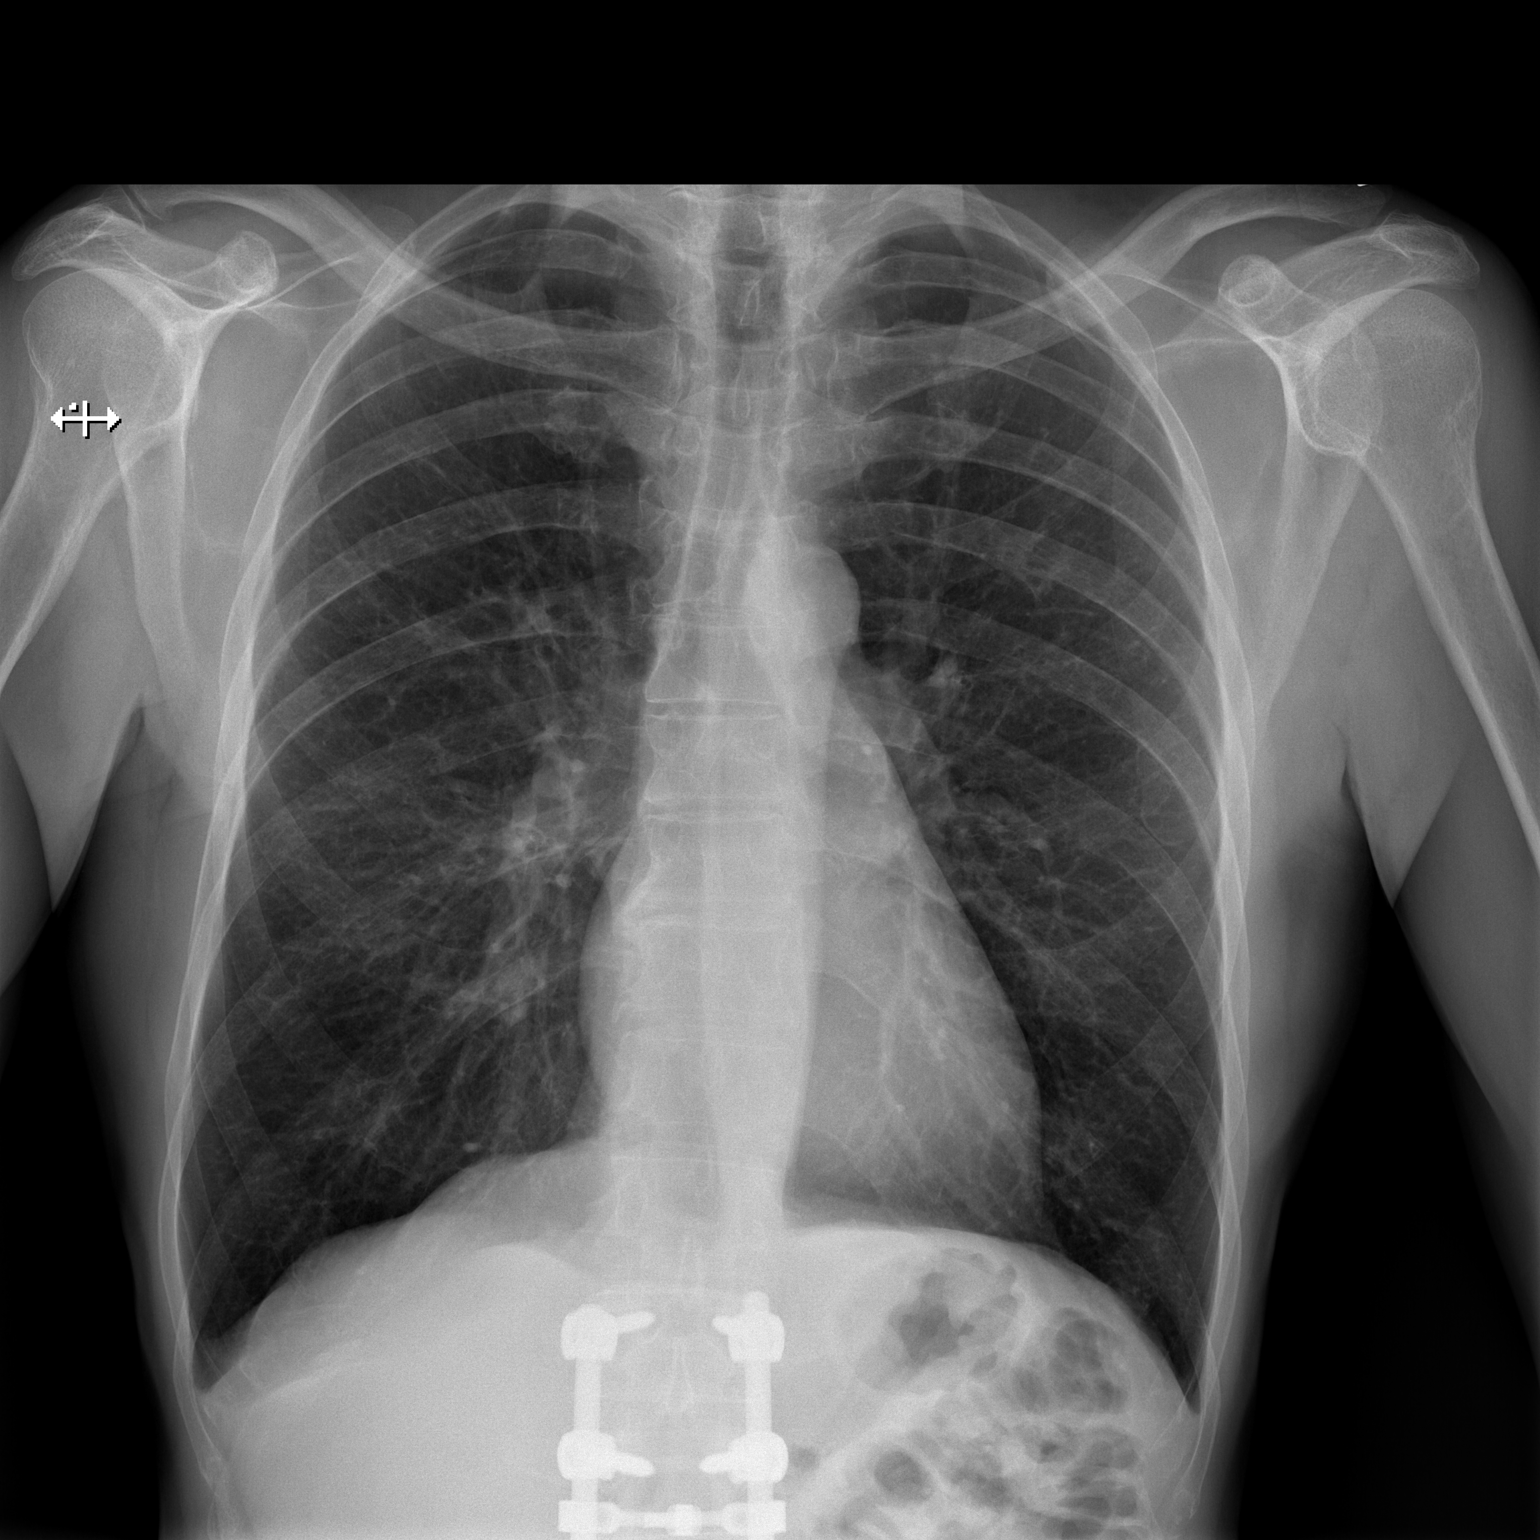

[w chest lat]
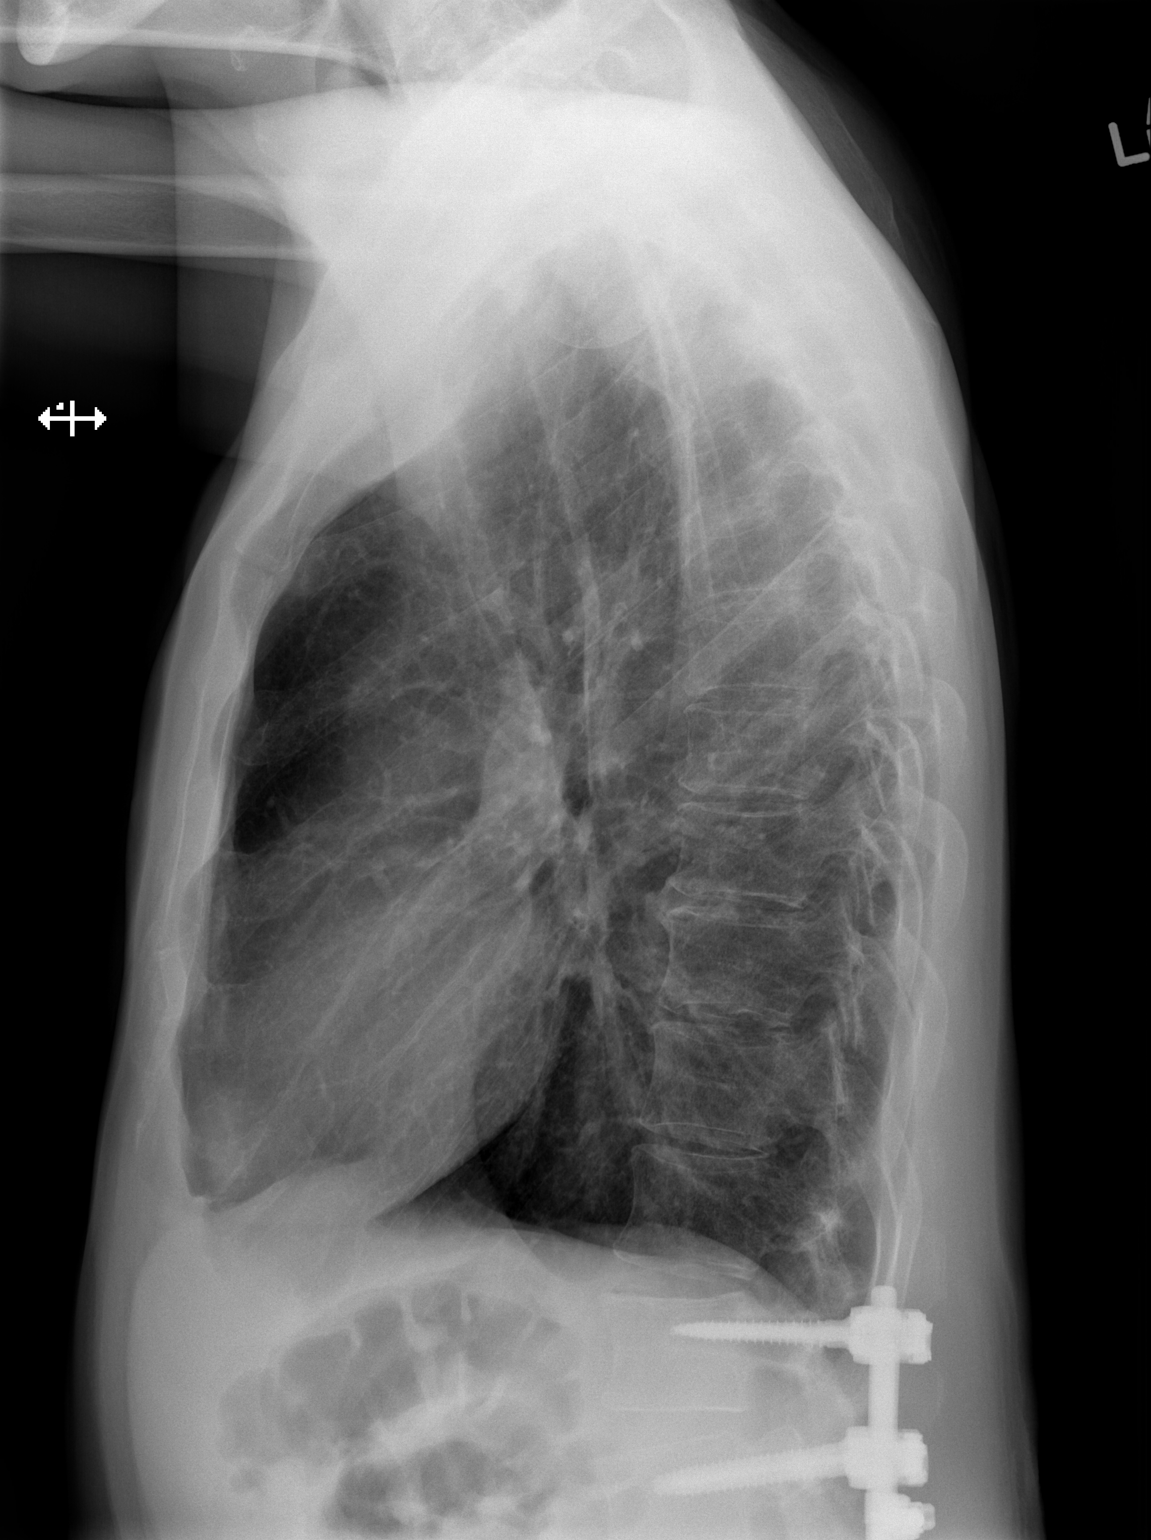

[2 of 2 positions shown; findings below may reference images not displayed]

FINDINGS: Cardiomediastinal silhouette unremarkable and unchanged.
Hyperinflation. Mildly prominent bronchovascular markings diffusely
and mild central peribronchial thickening, unchanged. Lungs clear.
Bronchovascular markings normal. Pulmonary vascularity normal. No
visible pleural effusions. No pneumothorax. Mild degenerative
changes involving the thoracic spine. Prior lumbar fusion.
IMPRESSION: COPD/emphysema. No acute cardiopulmonary disease. Stable
examination.

## 2015-03-03 ENCOUNTER — Encounter: Payer: Self-pay | Admitting: Registered Nurse

## 2015-03-03 ENCOUNTER — Encounter: Payer: Medicare Other | Attending: Physical Medicine and Rehabilitation | Admitting: Registered Nurse

## 2015-03-03 VITALS — BP 144/70 | HR 75 | Resp 14

## 2015-03-03 DIAGNOSIS — Z79899 Other long term (current) drug therapy: Secondary | ICD-10-CM | POA: Diagnosis not present

## 2015-03-03 DIAGNOSIS — Z9889 Other specified postprocedural states: Secondary | ICD-10-CM | POA: Diagnosis not present

## 2015-03-03 DIAGNOSIS — M542 Cervicalgia: Secondary | ICD-10-CM | POA: Insufficient documentation

## 2015-03-03 DIAGNOSIS — Z5181 Encounter for therapeutic drug level monitoring: Secondary | ICD-10-CM | POA: Insufficient documentation

## 2015-03-03 DIAGNOSIS — M961 Postlaminectomy syndrome, not elsewhere classified: Secondary | ICD-10-CM | POA: Insufficient documentation

## 2015-03-03 DIAGNOSIS — M5416 Radiculopathy, lumbar region: Secondary | ICD-10-CM | POA: Insufficient documentation

## 2015-03-03 MED ORDER — OXYMORPHONE HCL ER 20 MG PO TB12: 20.0000 mg | ORAL_TABLET | Freq: Two times a day (BID) | ORAL | Status: DC

## 2015-03-03 MED ORDER — OXYCODONE-ACETAMINOPHEN 7.5-325 MG PO TABS: 1.0000 | ORAL_TABLET | ORAL | Status: DC | PRN

## 2015-03-03 NOTE — Addendum Note (Signed)
Addended by: Geryl Rankins D on: 03/03/2015 01:17 PM   Modules accepted: Orders

## 2015-03-03 NOTE — Progress Notes (Signed)
   Subjective:    Patient ID: Bruce Olson, male    DOB: Nov 11, 1955, 60 y.o.   MRN: [id number]  HPI   Review of Systems     Objective:   Physical Exam        Assessment & Plan:

## 2015-03-03 NOTE — Progress Notes (Signed)
Subjective:    Patient ID: Bruce Olson, male    DOB: 11/20/1955, 60 y.o.   MRN: [id number]  HPI: [name] is a 60 year old male who returns for follow up for chronic pain and medication refill. Marland Kitchen He says his pain is located in his mid- lower back. He rates his pain 8. Also states he's been experiencing increase intensity of pain with colder weather. His current exercise regime is walking and performing stretching exercises.  Pain Inventory Average Pain 8 Pain Right Now 8 My pain is sharp, stabbing and tingling  In the last 24 hours, has pain interfered with the following? General activity 8 Relation with others 8 Enjoyment of life 8 What TIME of day is your pain at its worst? Morning, Daytime ,Evening & Night Sleep (in general) Poor  Pain is worse with: walking, sitting and standing Pain improves with: medication and TENS Relief from Meds: 8  Mobility walk without assistance use a cane how many minutes can you walk? 1/4 mile ability to climb steps?  yes do you drive?  yes  Function disabled: date disabled 10-21-10  Neuro/Psych weakness numbness tremor trouble walking  Prior Studies Any changes since last visit?  no x-rays CT/MRI  Physicians involved in your care Any changes since last visit?  no Neurosurgeon .   Family History  Problem Relation Age of Onset  . Anesthesia problems Neg Hx   . Hypotension Neg Hx   . Malignant hyperthermia Neg Hx   . Pseudochol deficiency Neg Hx    Social History   Social History  . Marital Status: Single    Spouse Name: N/A  . Number of Children: N/A  . Years of Education: N/A   Social History Main Topics  . Smoking status: Current Every Day Smoker -- 1.50 packs/day for 33 years    Types: Cigarettes  . Smokeless tobacco: Never Used  . Alcohol Use: Yes     Comment: beer , q couple of months  . Drug Use: No  . Sexual Activity: No     Comment: back problems   Other Topics Concern  . None    Social History Narrative   Past Surgical History  Procedure Laterality Date  . Back surgery  2013    Back surgery  (fusion)09/15/2010 & 2013  . Hardware removal N/A 03/14/2013    Procedure: HARDWARE REMOVAL;  Surgeon: Eustace Moore, MD;  Location: [place];  Service: Neurosurgery;  Laterality: N/A;  . Back surgery  03-2013   Past Medical History  Diagnosis Date  . No pertinent past medical history   . Occupational injury 2013    resulted in back injury that led to surgery   BP 144/70 mmHg  Pulse 75  Resp 14  SpO2 98%  Opioid Risk Score:   Fall Risk Score:  `1  Depression screen PHQ 2/9  Depression screen [facility] 2/9 09/22/2014 08/20/2014 04/21/2014  Decreased Interest 0 2 2  Down, Depressed, Hopeless 0 0 0  PHQ - 2 Score 0 2 2  Altered sleeping - - 3  Tired, decreased energy - - 3  Change in appetite - - 0  Feeling bad or failure about yourself  - - 0  Trouble concentrating - - 2  Moving slowly or fidgety/restless - - 0  Suicidal thoughts - - 0  PHQ-9 Score - - 10     Review of Systems  Musculoskeletal: Positive for gait problem.  Neurological: Positive for tremors,  weakness and numbness.  All other systems reviewed and are negative.      Objective:   Physical Exam  Constitutional: He is oriented to person, place, and time. He appears well-developed and well-nourished.  HENT:  Head: Normocephalic and atraumatic.  Neck: Normal range of motion. Neck supple.  Cardiovascular: Normal rate and regular rhythm.   Pulmonary/Chest: Effort normal and breath sounds normal.  Musculoskeletal:  Normal Muscle Bulk and Muscle Testing Reveals: Upper Extremities: Full ROM and Muscle Strength 5/5 Thoracic Paraspinal Tenderness: T-9- T-11 Lumbar Hypersensitivity Lower Extremities: Full ROM and Muscle Strength 5/5  Arises from Table with ease Narrow Based Gait  Neurological: He is alert and oriented to person, place, and time.  Skin: Skin is warm and dry.  Psychiatric: He  has a normal mood and affect.  Nursing note and vitals reviewed.         Assessment & Plan:  1. Lumbar postlaminectomy syndrome status post lumbar fusion x3: His most recent surgery was 03/14/2013. He had hardware removal and bone allograft waist and L2-3.  Refilled: oxyCODONE 7.5/325mg  one tablet every 4 hours as needed #100 and OPANA 20 mg one tablet every 12 hours #60.  2. Greater Trochanteric Bursitis: No Complaints Voiced Today. Continue with heat and exercise regimen.   15 minutes of face to face patient care time was spent during this visit. All questions were encouraged and answered.   F/U in 1 month

## 2015-03-09 LAB — TOXASSURE SELECT,+ANTIDEPR,UR: PDF: 0

## 2015-03-11 NOTE — Progress Notes (Signed)
Urine drug screen for this encounter is consistent for prescribed medication. However it is also positive for alcohol. This is the first positive for alcohol for Mr Calma. A formal warning letter about use of alcohol while being prescribed narcotics will be mailed to Mr Winders.

## 2015-03-31 ENCOUNTER — Encounter: Payer: Medicare Other | Attending: Physical Medicine and Rehabilitation | Admitting: Registered Nurse

## 2015-03-31 ENCOUNTER — Encounter: Payer: Self-pay | Admitting: Registered Nurse

## 2015-03-31 VITALS — BP 141/65 | HR 82

## 2015-03-31 DIAGNOSIS — Z5181 Encounter for therapeutic drug level monitoring: Secondary | ICD-10-CM | POA: Diagnosis not present

## 2015-03-31 DIAGNOSIS — M5416 Radiculopathy, lumbar region: Secondary | ICD-10-CM

## 2015-03-31 DIAGNOSIS — Z79899 Other long term (current) drug therapy: Secondary | ICD-10-CM | POA: Diagnosis not present

## 2015-03-31 DIAGNOSIS — Z9889 Other specified postprocedural states: Secondary | ICD-10-CM | POA: Diagnosis not present

## 2015-03-31 DIAGNOSIS — M542 Cervicalgia: Secondary | ICD-10-CM | POA: Diagnosis not present

## 2015-03-31 DIAGNOSIS — M961 Postlaminectomy syndrome, not elsewhere classified: Secondary | ICD-10-CM

## 2015-03-31 MED ORDER — OXYCODONE-ACETAMINOPHEN 7.5-325 MG PO TABS: 1.0000 | ORAL_TABLET | ORAL | Status: DC | PRN

## 2015-03-31 MED ORDER — OXYMORPHONE HCL ER 20 MG PO TB12: 20.0000 mg | ORAL_TABLET | Freq: Two times a day (BID) | ORAL | Status: DC

## 2015-03-31 MED ORDER — PREGABALIN 100 MG PO CAPS: 100.0000 mg | ORAL_CAPSULE | Freq: Three times a day (TID) | ORAL | Status: DC

## 2015-03-31 NOTE — Progress Notes (Signed)
Subjective:    Patient ID: Bruce Olson, male    DOB: [birth year], 60 y.o.   MRN: [id number]  HPI: [name] is a 60 year old male who returns for follow up for chronic pain and medication refill. Marland Kitchen He states his pain is located in his mid- lower back. He rates his pain 8. His current exercise regime is walking and performing stretching exercises.  Pain Inventory Average Pain 8 Pain Right Now 8 My pain is sharp, stabbing and tingling  In the last 24 hours, has pain interfered with the following? General activity 8 Relation with others 8 Enjoyment of life 8 What TIME of day is your pain at its worst? All times Sleep (in general) Poor  Pain is worse with: walking, sitting and standing Pain improves with: heat/ice, medication and TENS Relief from Meds: 8  Mobility walk without assistance use a cane how many minutes can you walk? 1/4 mile ability to climb steps?  yes do you drive?  yes  Function disabled: date disabled 05/2010  Neuro/Psych weakness numbness tremor trouble walking  Prior Studies Any changes since last visit?  no  Physicians involved in your care Any changes since last visit?  no   Family History  Problem Relation Age of Onset  . Anesthesia problems Neg Hx   . Hypotension Neg Hx   . Malignant hyperthermia Neg Hx   . Pseudochol deficiency Neg Hx    Social History   Social History  . Marital Status: Single    Spouse Name: N/A  . Number of Children: N/A  . Years of Education: N/A   Social History Main Topics  . Smoking status: Current Every Day Smoker -- 1.50 packs/day for 33 years    Types: Cigarettes  . Smokeless tobacco: Never Used  . Alcohol Use: Yes     Comment: beer , q couple of months  . Drug Use: No  . Sexual Activity: No     Comment: back problems   Other Topics Concern  . None   Social History Narrative   Past Surgical History  Procedure Laterality Date  . Back surgery  2013    Back surgery   (fusion)09/15/2010 & 2013  . Hardware removal N/A 03/14/2013    Procedure: HARDWARE REMOVAL;  Surgeon: Eustace Moore, MD;  Location: [place] [facility];  Service: Neurosurgery;  Laterality: N/A;  . Back surgery  03-2013   Past Medical History  Diagnosis Date  . No pertinent past medical history   . Occupational injury 2013    resulted in back injury that led to surgery   BP 141/65 mmHg  Pulse 82  SpO2 99%  Opioid Risk Score:   Fall Risk Score:  `1  Depression screen PHQ 2/9  Depression screen [facility] 2/9 09/22/2014 08/20/2014 04/21/2014  Decreased Interest 0 2 2  Down, Depressed, Hopeless 0 0 0  PHQ - 2 Score 0 2 2  Altered sleeping - - 3  Tired, decreased energy - - 3  Change in appetite - - 0  Feeling bad or failure about yourself  - - 0  Trouble concentrating - - 2  Moving slowly or fidgety/restless - - 0  Suicidal thoughts - - 0  PHQ-9 Score - - 10     Review of Systems  All other systems reviewed and are negative.      Objective:   Physical Exam  Constitutional: He is oriented to person, place, and time. He appears well-developed and  well-nourished.  HENT:  Head: Normocephalic and atraumatic.  Neck: Normal range of motion. Neck supple.  Cardiovascular: Normal rate and regular rhythm.   Pulmonary/Chest: Effort normal and breath sounds normal.  Musculoskeletal:  Normal Muscle Bulk and Muscle Testing Reveals: Upper Extremities: Full ROM and Muscle Strength 5/5 Thoracic Hypersensitivity: T-T-8- T-12 Lumbar Hypersensitivity Wearing soft back brace Lower Extremities: Full ROM and Muscle Strength 5/5 Arises from table with ease Narrow Based Gait  Neurological: He is alert and oriented to person, place, and time.  Skin: Skin is warm and dry.  Psychiatric: He has a normal mood and affect.  Nursing note and vitals reviewed.         Assessment & Plan:  1. Lumbar postlaminectomy syndrome status post lumbar fusion x3: His most recent surgery was 03/14/2013. He had hardware  removal and bone allograft waist and L2-3.  Refilled: oxyCODONE 7.5/325mg  one tablet every 4 hours as needed #100 and OPANA 20 mg one tablet every 12 hours #60.  2. Greater Trochanteric Bursitis: No Complaints Voiced Today. Continue with heat and exercise regimen.   15 minutes of face to face patient care time was spent during this visit. All questions were encouraged and answered.   F/U in 1 month

## 2015-04-18 DIAGNOSIS — Z23 Encounter for immunization: Secondary | ICD-10-CM | POA: Diagnosis not present

## 2015-04-18 DIAGNOSIS — F17200 Nicotine dependence, unspecified, uncomplicated: Secondary | ICD-10-CM | POA: Diagnosis not present

## 2015-04-18 DIAGNOSIS — S0181XA Laceration without foreign body of other part of head, initial encounter: Secondary | ICD-10-CM | POA: Diagnosis not present

## 2015-05-04 ENCOUNTER — Encounter: Payer: Self-pay | Admitting: Registered Nurse

## 2015-05-04 ENCOUNTER — Encounter: Payer: Medicare Other | Attending: Physical Medicine and Rehabilitation | Admitting: Registered Nurse

## 2015-05-04 VITALS — BP 140/70 | HR 84 | Resp 14

## 2015-05-04 DIAGNOSIS — Z5181 Encounter for therapeutic drug level monitoring: Secondary | ICD-10-CM | POA: Insufficient documentation

## 2015-05-04 DIAGNOSIS — M5416 Radiculopathy, lumbar region: Secondary | ICD-10-CM | POA: Insufficient documentation

## 2015-05-04 DIAGNOSIS — M542 Cervicalgia: Secondary | ICD-10-CM | POA: Insufficient documentation

## 2015-05-04 DIAGNOSIS — Z9889 Other specified postprocedural states: Secondary | ICD-10-CM | POA: Insufficient documentation

## 2015-05-04 DIAGNOSIS — Z79899 Other long term (current) drug therapy: Secondary | ICD-10-CM | POA: Diagnosis not present

## 2015-05-04 DIAGNOSIS — M961 Postlaminectomy syndrome, not elsewhere classified: Secondary | ICD-10-CM | POA: Diagnosis not present

## 2015-05-04 MED ORDER — OXYCODONE-ACETAMINOPHEN 7.5-325 MG PO TABS: 1.0000 | ORAL_TABLET | ORAL | Status: DC | PRN

## 2015-05-04 MED ORDER — OXYMORPHONE HCL ER 20 MG PO TB12: 20.0000 mg | ORAL_TABLET | Freq: Two times a day (BID) | ORAL | Status: DC

## 2015-05-04 NOTE — Progress Notes (Signed)
Subjective:    Patient ID: Bruce Olson, male    DOB: 1955/04/05, 60 y.o.   MRN: [id number]  HPI: [name] is a 60 year old male who returns for follow up for chronic pain and medication refill. Marland Kitchen He states his pain is located in his mid- lower back. He rates his pain 8. His current exercise regime is walking and performing stretching exercises. Also states two weeks ago a ladder slid off his car port and hit him on left temporal. He went to [facility] he had sutures placed.  Pain Inventory Average Pain 8 Pain Right Now 8 My pain is sharp and stabbing  In the last 24 hours, has pain interfered with the following? General activity 8 Relation with others 8 Enjoyment of life 8 What TIME of day is your pain at its worst? morning, daytime, evening, night Sleep (in general) Poor  Pain is worse with: walking, sitting and standing Pain improves with: heat/ice, medication and TENS Relief from Meds: 8  Mobility walk without assistance use a cane how many minutes can you walk? 1/4 mile ability to climb steps?  yes do you drive?  yes  Function disabled: date disabled 05/2010  Neuro/Psych weakness numbness tremor trouble walking  Prior Studies Any changes since last visit?  no x-rays CT/MRI  Physicians involved in your care Any changes since last visit?  no Neurologist . Neurosurgeon .   Family History  Problem Relation Age of Onset  . Anesthesia problems Neg Hx   . Hypotension Neg Hx   . Malignant hyperthermia Neg Hx   . Pseudochol deficiency Neg Hx    Social History   Social History  . Marital Status: Single    Spouse Name: N/A  . Number of Children: N/A  . Years of Education: N/A   Social History Main Topics  . Smoking status: Current Every Day Smoker -- 1.50 packs/day for 33 years    Types: Cigarettes  . Smokeless tobacco: Never Used  . Alcohol Use: Yes     Comment: beer , q couple of months  . Drug Use: No  . Sexual  Activity: No     Comment: back problems   Other Topics Concern  . None   Social History Narrative   Past Surgical History  Procedure Laterality Date  . Back surgery  2013    Back surgery  (fusion)09/15/2010 & 2013  . Hardware removal N/A 03/14/2013    Procedure: HARDWARE REMOVAL;  Surgeon: Eustace Moore, MD;  Location: [place] [facility];  Service: Neurosurgery;  Laterality: N/A;  . Back surgery  03-2013   Past Medical History  Diagnosis Date  . No pertinent past medical history   . Occupational injury 2013    resulted in back injury that led to surgery   BP 140/70 mmHg  Pulse 84  Resp 14  Opioid Risk Score:   Fall Risk Score:  `1  Depression screen PHQ 2/9  Depression screen [facility] 2/9 09/22/2014 08/20/2014 04/21/2014  Decreased Interest 0 2 2  Down, Depressed, Hopeless 0 0 0  PHQ - 2 Score 0 2 2  Altered sleeping - - 3  Tired, decreased energy - - 3  Change in appetite - - 0  Feeling bad or failure about yourself  - - 0  Trouble concentrating - - 2  Moving slowly or fidgety/restless - - 0  Suicidal thoughts - - 0  PHQ-9 Score - - 10  Review of Systems  Musculoskeletal: Positive for gait problem.  Neurological: Positive for tremors, weakness and numbness.  All other systems reviewed and are negative.      Objective:   Physical Exam  Constitutional: He is oriented to person, place, and time. He appears well-developed and well-nourished.  HENT:  Head: Normocephalic and atraumatic.  Neck: Normal range of motion. Neck supple.  Cardiovascular: Normal rate and regular rhythm.   Pulmonary/Chest: Effort normal and breath sounds normal.  Musculoskeletal:  Normal Muscle Bulk and Muscle Testing Reveals: Upper Extremities: Full ROM and Muscle Strength 5/5 Lumbar Hypersensitivity Lower Extremities: Full ROM and Muscle strength 5/5 Arises from table with ease Narrow Based Gait  Neurological: He is alert and oriented to person, place, and time.  Skin: Skin is warm and  dry.  Psychiatric: He has a normal mood and affect.  Nursing note and vitals reviewed.         Assessment & Plan:  1. Lumbar postlaminectomy syndrome status post lumbar fusion x3: His most recent surgery was 03/14/2013. He had hardware removal and bone allograft waist and L2-3.  Refilled: oxyCODONE 7.5/325mg  one tablet every 4 hours as needed #100 and OPANA 20 mg one tablet every 12 hours #60.  2. Greater Trochanteric Bursitis: No Complaints Voiced Today. Continue with heat and exercise regimen.   15 minutes of face to face patient care time was spent during this visit. All questions were encouraged and answered.   F/U in 1 month

## 2015-06-03 ENCOUNTER — Encounter: Payer: Self-pay | Admitting: Registered Nurse

## 2015-06-03 ENCOUNTER — Encounter: Payer: Medicare Other | Attending: Physical Medicine and Rehabilitation | Admitting: Registered Nurse

## 2015-06-03 VITALS — BP 121/56 | HR 67 | Resp 14

## 2015-06-03 DIAGNOSIS — Z9889 Other specified postprocedural states: Secondary | ICD-10-CM | POA: Insufficient documentation

## 2015-06-03 DIAGNOSIS — M5416 Radiculopathy, lumbar region: Secondary | ICD-10-CM | POA: Diagnosis not present

## 2015-06-03 DIAGNOSIS — G894 Chronic pain syndrome: Secondary | ICD-10-CM | POA: Diagnosis not present

## 2015-06-03 DIAGNOSIS — Z5181 Encounter for therapeutic drug level monitoring: Secondary | ICD-10-CM | POA: Diagnosis not present

## 2015-06-03 DIAGNOSIS — Z79899 Other long term (current) drug therapy: Secondary | ICD-10-CM | POA: Insufficient documentation

## 2015-06-03 DIAGNOSIS — M542 Cervicalgia: Secondary | ICD-10-CM | POA: Diagnosis not present

## 2015-06-03 DIAGNOSIS — M961 Postlaminectomy syndrome, not elsewhere classified: Secondary | ICD-10-CM | POA: Insufficient documentation

## 2015-06-03 MED ORDER — OXYCODONE-ACETAMINOPHEN 7.5-325 MG PO TABS: 1.0000 | ORAL_TABLET | ORAL | Status: DC | PRN

## 2015-06-03 MED ORDER — OXYMORPHONE HCL ER 20 MG PO TB12: 20.0000 mg | ORAL_TABLET | Freq: Two times a day (BID) | ORAL | Status: DC

## 2015-06-03 NOTE — Progress Notes (Signed)
Subjective:    Patient ID: Bruce Olson, male    DOB: [birth year], 60 y.o.   MRN: [id number]  HPI: Bruce Olson is a 60 year old male who returns for follow up for chronic pain and medication refill. He states his pain is located in his mid- lower back. He rates his pain 8. His current exercise regime is walking and performing stretching exercises. Also states he has noticed a hard lump on the left side of his neck he has an appointment with his PCP today.  Pain Inventory Average Pain 8 Pain Right Now 8 My pain is sharp, stabbing and tingling  In the last 24 hours, has pain interfered with the following? General activity 8 Relation with others 8 Enjoyment of life 8 What TIME of day is your pain at its worst? all Sleep (in general) Poor  Pain is worse with: walking, sitting and standing Pain improves with: medication and TENS Relief from Meds: 8  Mobility walk without assistance use a cane how many minutes can you walk? 10-20 ability to climb steps?  yes do you drive?  yes  Function disabled: date disabled .  Neuro/Psych weakness numbness tremor trouble walking  Prior Studies Any changes since last visit?  no  Physicians involved in your care Any changes since last visit?  no   Family History  Problem Relation Age of Onset  . Anesthesia problems Neg Hx   . Hypotension Neg Hx   . Malignant hyperthermia Neg Hx   . Pseudochol deficiency Neg Hx    Social History   Social History  . Marital Status: Single    Spouse Name: N/A  . Number of Children: N/A  . Years of Education: N/A   Social History Main Topics  . Smoking status: Current Every Day Smoker -- 1.50 packs/day for 33 years    Types: Cigarettes  . Smokeless tobacco: Never Used  . Alcohol Use: Yes     Comment: beer , q couple of months  . Drug Use: No  . Sexual Activity: No     Comment: back problems   Other Topics Concern  . None   Social History Narrative   Past Surgical  History  Procedure Laterality Date  . Back surgery  2013    Back surgery  (fusion)09/15/2010 & 2013  . Hardware removal N/A 03/14/2013    Procedure: HARDWARE REMOVAL;  Surgeon: Eustace Moore, MD;  Location: [place] [facility];  Service: Neurosurgery;  Laterality: N/A;  . Back surgery  03-2013   Past Medical History  Diagnosis Date  . No pertinent past medical history   . Occupational injury 2013    resulted in back injury that led to surgery   BP 121/56 mmHg  Pulse 67  Resp 14  SpO2 97%  Opioid Risk Score:   Fall Risk Score:  `1  Depression screen PHQ 2/9  Depression screen [facility] 2/9 09/22/2014 08/20/2014 04/21/2014  Decreased Interest 0 2 2  Down, Depressed, Hopeless 0 0 0  PHQ - 2 Score 0 2 2  Altered sleeping - - 3  Tired, decreased energy - - 3  Change in appetite - - 0  Feeling bad or failure about yourself  - - 0  Trouble concentrating - - 2  Moving slowly or fidgety/restless - - 0  Suicidal thoughts - - 0  PHQ-9 Score - - 10     Review of Systems  All other systems reviewed and are negative.  Objective:   Physical Exam  Constitutional: He is oriented to person, place, and time. He appears well-developed and well-nourished.  HENT:  Head: Normocephalic and atraumatic.  Neck: Normal range of motion. Neck supple.  Cardiovascular: Normal rate and regular rhythm.   Pulmonary/Chest: Effort normal and breath sounds normal.  Musculoskeletal:  Normal Muscle Bulk and Muscle Testing Reveals: Upper Extremities: Full ROM and Muscle Strength 5/5 Thoracic Paraspinal Tenderness: T-10- T-12 Lumbar Hypersensitivity Lower Extremities: Full ROM and Muscle Strength 5/5 Arises from Table with ease Narrow Based Gait  Neurological: He is alert and oriented to person, place, and time.  Skin: Skin is warm and dry.  Hard Nodule left side neck  Psychiatric: He has a normal mood and affect.  Nursing note and vitals reviewed.         Assessment & Plan:  1. Lumbar postlaminectomy  syndrome status post lumbar fusion x3: His most recent surgery was 03/14/2013. He had hardware removal and bone allograft waist and L2-3.  Refilled: oxyCODONE 7.5/325mg  one tablet every 4 hours as needed #100 and OPANA 20 mg one tablet every 12 hours #60.  We will continue the opioid monitoring program, this consists of regular clinic visits, examinations, urine drug screen, pill counts as well as use of [place] Controlled Substance Reporting System. 2. Greater Trochanteric Bursitis: No Complaints Voiced Today. Continue with heat and exercise regimen.   15 minutes of face to face patient care time was spent during this visit. All questions were encouraged and answered.   F/U in 1 month

## 2015-06-08 DIAGNOSIS — L723 Sebaceous cyst: Secondary | ICD-10-CM | POA: Diagnosis not present

## 2015-06-08 DIAGNOSIS — R221 Localized swelling, mass and lump, neck: Secondary | ICD-10-CM | POA: Diagnosis not present

## 2015-06-10 LAB — TOXASSURE SELECT,+ANTIDEPR,UR

## 2015-06-10 NOTE — Progress Notes (Signed)
Urine drug screen for this encounter is consistent for prescribed medication 

## 2015-06-12 DIAGNOSIS — R221 Localized swelling, mass and lump, neck: Secondary | ICD-10-CM | POA: Diagnosis not present

## 2015-06-16 DIAGNOSIS — R221 Localized swelling, mass and lump, neck: Secondary | ICD-10-CM | POA: Diagnosis not present

## 2015-06-16 DIAGNOSIS — L723 Sebaceous cyst: Secondary | ICD-10-CM | POA: Diagnosis not present

## 2015-06-16 DIAGNOSIS — J351 Hypertrophy of tonsils: Secondary | ICD-10-CM | POA: Diagnosis not present

## 2015-06-16 DIAGNOSIS — J359 Chronic disease of tonsils and adenoids, unspecified: Secondary | ICD-10-CM | POA: Diagnosis not present

## 2015-06-22 DIAGNOSIS — J359 Chronic disease of tonsils and adenoids, unspecified: Secondary | ICD-10-CM | POA: Diagnosis not present

## 2015-06-22 DIAGNOSIS — R896 Abnormal cytological findings in specimens from other organs, systems and tissues: Secondary | ICD-10-CM | POA: Diagnosis not present

## 2015-06-22 DIAGNOSIS — C099 Malignant neoplasm of tonsil, unspecified: Secondary | ICD-10-CM | POA: Diagnosis not present

## 2015-06-24 DIAGNOSIS — C099 Malignant neoplasm of tonsil, unspecified: Secondary | ICD-10-CM | POA: Diagnosis not present

## 2015-07-03 ENCOUNTER — Ambulatory Visit (HOSPITAL_BASED_OUTPATIENT_CLINIC_OR_DEPARTMENT_OTHER): Payer: Medicare Other | Admitting: Physical Medicine & Rehabilitation

## 2015-07-03 ENCOUNTER — Encounter: Payer: Self-pay | Admitting: Physical Medicine & Rehabilitation

## 2015-07-03 ENCOUNTER — Encounter: Payer: Medicare Other | Attending: Physical Medicine and Rehabilitation

## 2015-07-03 VITALS — BP 144/74 | HR 81 | Resp 15

## 2015-07-03 DIAGNOSIS — Z5181 Encounter for therapeutic drug level monitoring: Secondary | ICD-10-CM | POA: Diagnosis not present

## 2015-07-03 DIAGNOSIS — Z79899 Other long term (current) drug therapy: Secondary | ICD-10-CM | POA: Diagnosis not present

## 2015-07-03 DIAGNOSIS — M542 Cervicalgia: Secondary | ICD-10-CM | POA: Diagnosis not present

## 2015-07-03 DIAGNOSIS — Z9889 Other specified postprocedural states: Secondary | ICD-10-CM | POA: Insufficient documentation

## 2015-07-03 DIAGNOSIS — M5416 Radiculopathy, lumbar region: Secondary | ICD-10-CM | POA: Insufficient documentation

## 2015-07-03 DIAGNOSIS — M961 Postlaminectomy syndrome, not elsewhere classified: Secondary | ICD-10-CM | POA: Diagnosis not present

## 2015-07-03 MED ORDER — OXYCODONE-ACETAMINOPHEN 7.5-325 MG PO TABS: 1.0000 | ORAL_TABLET | ORAL | Status: DC | PRN

## 2015-07-03 MED ORDER — OXYMORPHONE HCL ER 20 MG PO TB12: 20.0000 mg | ORAL_TABLET | Freq: Two times a day (BID) | ORAL | Status: DC

## 2015-07-03 MED ORDER — NALOXONE HCL 4 MG/0.1ML NA LIQD: 1.0000 | Freq: Once | NASAL | Status: DC

## 2015-07-03 NOTE — Patient Instructions (Signed)
If Opana ER is taken off the market, would substitute OxyContin

## 2015-07-03 NOTE — Progress Notes (Signed)
Subjective:    Patient ID: Bruce Olson, male    DOB: 12-07-1955, 60 y.o.   MRN: [id number]  HPI L2-S1 fusion in 2013. He had loosening of the L2  Screws as well as right L4 screw. Had screw removal Continues have chronic low back pain. 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  Gets partial relief with TENs unit, uses at night for 1 hour, helps pt relax  Also wears lumbar orthosis.  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 Patient is getting a second opinion at [place] Average Pain 8 Pain Right Now 8 My pain is sharp, stabbing and tingling  In the last 24 hours, has pain interfered with the following? General activity 8 Relation with others 8 Enjoyment of life 8 What TIME of day is your pain at its worst? morning, daytime, evening, night Sleep (in general) Poor  Pain is worse with: walking, sitting and standing Pain improves with: medication and TENS Relief from Meds: 8  Mobility walk without assistance use a cane ability to climb steps?  yes do you drive?  yes  Function disabled: date disabled 05/21/10  Neuro/Psych weakness numbness tremor trouble walking  Prior Studies Any changes since last visit?  no x-rays CT/MRI  Physicians involved in your care Any changes since last visit?  no Neurologist . Neurosurgeon .   Family History  Problem Relation Age of Onset  . Anesthesia problems Neg Hx   . Hypotension Neg Hx   . Malignant hyperthermia Neg Hx   . Pseudochol deficiency Neg Hx    Social History   Social History  . Marital Status: Single    Spouse Name: N/A  . Number of Children: N/A  . Years of Education: N/A   Social History Main Topics  .  Smoking status: Current Every Day Smoker -- 1.50 packs/day for 33 years    Types: Cigarettes  . Smokeless tobacco: Never Used  . Alcohol Use: Yes     Comment: beer , q couple of months  . Drug Use: No  . Sexual Activity: No     Comment: back problems   Other Topics Concern  . None   Social History Narrative   Past Surgical History  Procedure Laterality Date  . Back surgery  2013    Back surgery  (fusion)09/15/2010 & 2013  . Hardware removal N/A 03/14/2013    Procedure: HARDWARE REMOVAL;  Surgeon: Eustace Moore, MD;  Location: [place] [facility];  Service: Neurosurgery;  Laterality: N/A;  . Back surgery  03-2013   Past Medical History  Diagnosis Date  . No pertinent past medical history   . Occupational injury 2013    resulted in back injury that led to surgery   BP 144/74 mmHg  Pulse 81  Resp 15  SpO2 97%  Opioid Risk Score:   Fall Risk Score:  `1  Depression screen PHQ 2/9  Depression screen [facility] 2/9 09/22/2014 08/20/2014 04/21/2014  Decreased Interest 0 2 2  Down, Depressed, Hopeless 0 0 0  PHQ - 2 Score 0 2 2  Altered sleeping - - 3  Tired, decreased energy - - 3  Change in appetite - - 0  Feeling bad or failure about yourself  - - 0  Trouble concentrating - - 2  Moving slowly or fidgety/restless - - 0  Suicidal thoughts - - 0  PHQ-9 Score - - 10     Review of Systems  Musculoskeletal: Positive for gait problem.  Neurological: Positive for tremors, weakness and numbness.  All other systems reviewed and are negative.      Objective:   Physical Exam  Musculoskeletal:       Right hip: He exhibits normal range of motion and no bony tenderness.       Left hip: He exhibits normal range of motion and no bony tenderness.       Lumbar back: He exhibits decreased range of motion, tenderness and deformity.  Lumbar range of motion 0-25% flexion extension lateral bending and rotation Flatback deformity  Healed lumbar incision from L1-S1  Neurological:  Reflex Scores:       Patellar reflexes are 2+ on the right side and 2+ on the left side.      Achilles reflexes are 2+ on the right side and 2+ on the left side. Pinprick intact at bilateral L3-L4 L5-S1 dermatomal distribution  Motor strength is 5/5 bilateral hip flexion and extension strength to  Flexor          Assessment & Plan:  1.  Lumbar postlaminectomy syndrome with chronic postoperative axial pain as well as chronic right lower extremity radiculopathic pain.  Currently on Opana 20 mg extended release twice a day. #60 no refills. We discussed that this may be taken off the market. If so would substitute OxyContin 20 mg twice a day  Continue Percocet 7.5 mg 3-4 tablets per day #100  Given that he is on a high-dose opioid regimen, greater than 120 mg of morphine equivalent per day, we will prescribe Narcan nasal spray to be used if there is a overdose.  Continue opioid monitoring program. This consists of regular clinic visits, examinations, urine drug screen, pill counts as well as use of [place] controlled substance reporting System.

## 2015-07-15 DIAGNOSIS — C099 Malignant neoplasm of tonsil, unspecified: Secondary | ICD-10-CM | POA: Diagnosis not present

## 2015-07-15 DIAGNOSIS — F17210 Nicotine dependence, cigarettes, uncomplicated: Secondary | ICD-10-CM | POA: Diagnosis not present

## 2015-07-22 DIAGNOSIS — C099 Malignant neoplasm of tonsil, unspecified: Secondary | ICD-10-CM | POA: Diagnosis not present

## 2015-07-22 DIAGNOSIS — R1312 Dysphagia, oropharyngeal phase: Secondary | ICD-10-CM | POA: Diagnosis not present

## 2015-07-22 DIAGNOSIS — F17210 Nicotine dependence, cigarettes, uncomplicated: Secondary | ICD-10-CM | POA: Diagnosis not present

## 2015-07-24 DIAGNOSIS — C099 Malignant neoplasm of tonsil, unspecified: Secondary | ICD-10-CM | POA: Diagnosis not present

## 2015-07-28 DIAGNOSIS — C099 Malignant neoplasm of tonsil, unspecified: Secondary | ICD-10-CM | POA: Diagnosis not present

## 2015-07-28 DIAGNOSIS — C109 Malignant neoplasm of oropharynx, unspecified: Secondary | ICD-10-CM | POA: Diagnosis not present

## 2015-07-28 DIAGNOSIS — Z931 Gastrostomy status: Secondary | ICD-10-CM | POA: Diagnosis not present

## 2015-07-28 DIAGNOSIS — F17210 Nicotine dependence, cigarettes, uncomplicated: Secondary | ICD-10-CM | POA: Diagnosis not present

## 2015-07-29 DIAGNOSIS — C099 Malignant neoplasm of tonsil, unspecified: Secondary | ICD-10-CM | POA: Diagnosis not present

## 2015-07-29 DIAGNOSIS — R918 Other nonspecific abnormal finding of lung field: Secondary | ICD-10-CM | POA: Diagnosis not present

## 2015-07-29 DIAGNOSIS — C098 Malignant neoplasm of overlapping sites of tonsil: Secondary | ICD-10-CM | POA: Diagnosis not present

## 2015-07-29 DIAGNOSIS — M549 Dorsalgia, unspecified: Secondary | ICD-10-CM | POA: Diagnosis not present

## 2015-07-29 DIAGNOSIS — G8929 Other chronic pain: Secondary | ICD-10-CM | POA: Diagnosis not present

## 2015-07-29 DIAGNOSIS — F17210 Nicotine dependence, cigarettes, uncomplicated: Secondary | ICD-10-CM | POA: Diagnosis not present

## 2015-07-29 DIAGNOSIS — F17200 Nicotine dependence, unspecified, uncomplicated: Secondary | ICD-10-CM | POA: Diagnosis not present

## 2015-07-30 DIAGNOSIS — C099 Malignant neoplasm of tonsil, unspecified: Secondary | ICD-10-CM | POA: Diagnosis not present

## 2015-07-30 DIAGNOSIS — C770 Secondary and unspecified malignant neoplasm of lymph nodes of head, face and neck: Secondary | ICD-10-CM | POA: Diagnosis not present

## 2015-07-31 ENCOUNTER — Encounter: Payer: Self-pay | Admitting: Registered Nurse

## 2015-07-31 ENCOUNTER — Encounter (HOSPITAL_BASED_OUTPATIENT_CLINIC_OR_DEPARTMENT_OTHER): Payer: Medicare Other | Admitting: Registered Nurse

## 2015-07-31 VITALS — BP 124/71 | HR 67

## 2015-07-31 DIAGNOSIS — M542 Cervicalgia: Secondary | ICD-10-CM | POA: Diagnosis not present

## 2015-07-31 DIAGNOSIS — Z5181 Encounter for therapeutic drug level monitoring: Secondary | ICD-10-CM

## 2015-07-31 DIAGNOSIS — Z79899 Other long term (current) drug therapy: Secondary | ICD-10-CM | POA: Diagnosis not present

## 2015-07-31 DIAGNOSIS — M961 Postlaminectomy syndrome, not elsewhere classified: Secondary | ICD-10-CM

## 2015-07-31 DIAGNOSIS — G894 Chronic pain syndrome: Secondary | ICD-10-CM

## 2015-07-31 DIAGNOSIS — M5416 Radiculopathy, lumbar region: Secondary | ICD-10-CM

## 2015-07-31 DIAGNOSIS — Z9889 Other specified postprocedural states: Secondary | ICD-10-CM

## 2015-07-31 MED ORDER — OXYCODONE-ACETAMINOPHEN 7.5-325 MG PO TABS: 1.0000 | ORAL_TABLET | ORAL | Status: DC | PRN

## 2015-07-31 MED ORDER — OXYMORPHONE HCL ER 20 MG PO TB12: 20.0000 mg | ORAL_TABLET | Freq: Two times a day (BID) | ORAL | Status: DC

## 2015-07-31 NOTE — Progress Notes (Signed)
Subjective:    Patient ID: Bruce Olson, male    DOB: 01/08/1956, 60 y.o.   MRN: [id number]  HPI: Bruce Olson is a 60 year old male who returns for follow up for chronic pain and medication refill. He states his pain is located in his mid- lower back. He rates his pain 8. His current exercise regime is walking and performing stretching exercises. Also states he has been diagnosed with Squamous Cell Carcinoma Stage 4, he will be starting Chemotherapy and Radiation next month. His oncologist is Dr. Dewitt Hoes Porosnicu at [facility].  Pain Inventory Average Pain 8 Pain Right Now 8 My pain is sharp, stabbing and tingling  In the last 24 hours, has pain interfered with the following? General activity 8 Relation with others 8 Enjoyment of life 8 What TIME of day is your pain at its worst? all Sleep (in general) Poor  Pain is worse with: walking, sitting and standing Pain improves with: medication and TENS Relief from Meds: 8  Mobility walk without assistance use a cane how many minutes can you walk? 10 ability to climb steps?  yes do you drive?  yes  Function disabled: date disabled .  Neuro/Psych weakness numbness tremor trouble walking  Prior Studies Any changes since last visit?  no x-rays CT/MRI  Physicians involved in your care Any changes since last visit?  no   Family History  Problem Relation Age of Onset  . Anesthesia problems Neg Hx   . Hypotension Neg Hx   . Malignant hyperthermia Neg Hx   . Pseudochol deficiency Neg Hx    Social History   Social History  . Marital Status: Single    Spouse Name: N/A  . Number of Children: N/A  . Years of Education: N/A   Social History Main Topics  . Smoking status: Current Every Day Smoker -- 1.50 packs/day for 33 years    Types: Cigarettes  . Smokeless tobacco: Never Used  . Alcohol Use: Yes     Comment: beer , q couple of months  . Drug Use: No  . Sexual Activity: No     Comment:  back problems   Other Topics Concern  . None   Social History Narrative   Past Surgical History  Procedure Laterality Date  . Back surgery  2013    Back surgery  (fusion)09/15/2010 & 2013  . Hardware removal N/A 03/14/2013    Procedure: HARDWARE REMOVAL;  Surgeon: Eustace Moore, MD;  Location: [place] [facility];  Service: Neurosurgery;  Laterality: N/A;  . Back surgery  03-2013   Past Medical History  Diagnosis Date  . No pertinent past medical history   . Occupational injury 2013    resulted in back injury that led to surgery   BP 124/71 mmHg  Pulse 67  SpO2 96%  Opioid Risk Score:   Fall Risk Score:  `1  Depression screen PHQ 2/9  Depression screen [facility] 2/9 09/22/2014 08/20/2014 04/21/2014  Decreased Interest 0 2 2  Down, Depressed, Hopeless 0 0 0  PHQ - 2 Score 0 2 2  Altered sleeping - - 3  Tired, decreased energy - - 3  Change in appetite - - 0  Feeling bad or failure about yourself  - - 0  Trouble concentrating - - 2  Moving slowly or fidgety/restless - - 0  Suicidal thoughts - - 0  PHQ-9 Score - - 10     Review of Systems  HENT: Negative.  Eyes: Negative.   Respiratory: Negative.   Cardiovascular: Negative.   Gastrointestinal: Negative.   Endocrine: Negative.   Musculoskeletal: Positive for back pain and gait problem.  Skin: Negative.   Allergic/Immunologic: Negative.   Neurological: Positive for tremors, weakness and numbness.  Hematological: Negative.   Psychiatric/Behavioral: Negative.   All other systems reviewed and are negative.      Objective:   Physical Exam  Constitutional: He is oriented to person, place, and time. He appears well-developed and well-nourished.  HENT:  Head: Normocephalic and atraumatic.  Neck: Normal range of motion. Neck supple.  Cardiovascular: Normal rate and regular rhythm.   Pulmonary/Chest: Effort normal and breath sounds normal.  Musculoskeletal:  Normal Muscle Bulk and Muscle Testing reveals: Upper Extremities:  Full ROM and Muscle Strength 5/5 Thoracic and Lumbar Hypersensitivity Wearing soft back brace Lower Extremities: Full ROM and Muscle Strength 5/5 Arises from Table with ease Narrow Based Gait  Neurological: He is alert and oriented to person, place, and time.  Skin: Skin is warm and dry.  Psychiatric: He has a normal mood and affect.  Nursing note and vitals reviewed.         Assessment & Plan:  1. Lumbar postlaminectomy syndrome status post lumbar fusion x3: His most recent surgery was 03/14/2013. He had hardware removal and bone allograft waist and L2-3.  Refilled: oxyCODONE 7.5/325mg  one tablet every 4 hours as needed #100 and OPANA 20 mg one tablet every 12 hours #60.  We will continue the opioid monitoring program, this consists of regular clinic visits, examinations, urine drug screen, pill counts as well as use of [place] Controlled Substance Reporting System. 2. Greater Trochanteric Bursitis: No Complaints Voiced Today. Continue with heat and exercise regimen.   15 minutes of face to face patient care time was spent during this visit. All questions were encouraged and answered.   F/U in 1 month

## 2015-08-02 DIAGNOSIS — C770 Secondary and unspecified malignant neoplasm of lymph nodes of head, face and neck: Secondary | ICD-10-CM | POA: Diagnosis not present

## 2015-08-02 DIAGNOSIS — C099 Malignant neoplasm of tonsil, unspecified: Secondary | ICD-10-CM | POA: Diagnosis not present

## 2015-08-07 DIAGNOSIS — K9423 Gastrostomy malfunction: Secondary | ICD-10-CM | POA: Diagnosis not present

## 2015-08-07 DIAGNOSIS — C770 Secondary and unspecified malignant neoplasm of lymph nodes of head, face and neck: Secondary | ICD-10-CM | POA: Diagnosis not present

## 2015-08-07 DIAGNOSIS — C099 Malignant neoplasm of tonsil, unspecified: Secondary | ICD-10-CM | POA: Diagnosis not present

## 2015-08-07 DIAGNOSIS — Z510 Encounter for antineoplastic radiation therapy: Secondary | ICD-10-CM | POA: Diagnosis not present

## 2015-08-11 DIAGNOSIS — C099 Malignant neoplasm of tonsil, unspecified: Secondary | ICD-10-CM | POA: Diagnosis not present

## 2015-08-11 DIAGNOSIS — C770 Secondary and unspecified malignant neoplasm of lymph nodes of head, face and neck: Secondary | ICD-10-CM | POA: Diagnosis not present

## 2015-08-11 DIAGNOSIS — Z510 Encounter for antineoplastic radiation therapy: Secondary | ICD-10-CM | POA: Diagnosis not present

## 2015-08-11 DIAGNOSIS — K9423 Gastrostomy malfunction: Secondary | ICD-10-CM | POA: Diagnosis not present

## 2015-08-17 DIAGNOSIS — K9423 Gastrostomy malfunction: Secondary | ICD-10-CM | POA: Diagnosis not present

## 2015-08-17 DIAGNOSIS — C770 Secondary and unspecified malignant neoplasm of lymph nodes of head, face and neck: Secondary | ICD-10-CM | POA: Diagnosis not present

## 2015-08-17 DIAGNOSIS — Z510 Encounter for antineoplastic radiation therapy: Secondary | ICD-10-CM | POA: Diagnosis not present

## 2015-08-17 DIAGNOSIS — C099 Malignant neoplasm of tonsil, unspecified: Secondary | ICD-10-CM | POA: Diagnosis not present

## 2015-08-18 DIAGNOSIS — C099 Malignant neoplasm of tonsil, unspecified: Secondary | ICD-10-CM | POA: Diagnosis not present

## 2015-08-18 DIAGNOSIS — Z510 Encounter for antineoplastic radiation therapy: Secondary | ICD-10-CM | POA: Diagnosis not present

## 2015-08-18 DIAGNOSIS — K9423 Gastrostomy malfunction: Secondary | ICD-10-CM | POA: Diagnosis not present

## 2015-08-18 DIAGNOSIS — C770 Secondary and unspecified malignant neoplasm of lymph nodes of head, face and neck: Secondary | ICD-10-CM | POA: Diagnosis not present

## 2015-08-19 DIAGNOSIS — C099 Malignant neoplasm of tonsil, unspecified: Secondary | ICD-10-CM | POA: Diagnosis not present

## 2015-08-19 DIAGNOSIS — C770 Secondary and unspecified malignant neoplasm of lymph nodes of head, face and neck: Secondary | ICD-10-CM | POA: Diagnosis not present

## 2015-08-19 DIAGNOSIS — M545 Low back pain: Secondary | ICD-10-CM | POA: Diagnosis not present

## 2015-08-19 DIAGNOSIS — Z510 Encounter for antineoplastic radiation therapy: Secondary | ICD-10-CM | POA: Diagnosis not present

## 2015-08-19 DIAGNOSIS — Z431 Encounter for attention to gastrostomy: Secondary | ICD-10-CM | POA: Diagnosis not present

## 2015-08-19 DIAGNOSIS — M1990 Unspecified osteoarthritis, unspecified site: Secondary | ICD-10-CM | POA: Diagnosis not present

## 2015-08-19 DIAGNOSIS — Z79899 Other long term (current) drug therapy: Secondary | ICD-10-CM | POA: Diagnosis not present

## 2015-08-19 DIAGNOSIS — F17210 Nicotine dependence, cigarettes, uncomplicated: Secondary | ICD-10-CM | POA: Diagnosis not present

## 2015-08-19 DIAGNOSIS — G8929 Other chronic pain: Secondary | ICD-10-CM | POA: Diagnosis not present

## 2015-08-19 DIAGNOSIS — G4733 Obstructive sleep apnea (adult) (pediatric): Secondary | ICD-10-CM | POA: Diagnosis not present

## 2015-08-19 DIAGNOSIS — R1319 Other dysphagia: Secondary | ICD-10-CM | POA: Diagnosis not present

## 2015-08-19 DIAGNOSIS — K9423 Gastrostomy malfunction: Secondary | ICD-10-CM | POA: Diagnosis not present

## 2015-08-19 DIAGNOSIS — R1312 Dysphagia, oropharyngeal phase: Secondary | ICD-10-CM | POA: Diagnosis not present

## 2015-08-24 DIAGNOSIS — K9423 Gastrostomy malfunction: Secondary | ICD-10-CM | POA: Diagnosis not present

## 2015-08-24 DIAGNOSIS — Z510 Encounter for antineoplastic radiation therapy: Secondary | ICD-10-CM | POA: Diagnosis not present

## 2015-08-24 DIAGNOSIS — C770 Secondary and unspecified malignant neoplasm of lymph nodes of head, face and neck: Secondary | ICD-10-CM | POA: Diagnosis not present

## 2015-08-24 DIAGNOSIS — C099 Malignant neoplasm of tonsil, unspecified: Secondary | ICD-10-CM | POA: Diagnosis not present

## 2015-08-25 DIAGNOSIS — Z510 Encounter for antineoplastic radiation therapy: Secondary | ICD-10-CM | POA: Diagnosis not present

## 2015-08-25 DIAGNOSIS — C770 Secondary and unspecified malignant neoplasm of lymph nodes of head, face and neck: Secondary | ICD-10-CM | POA: Diagnosis not present

## 2015-08-25 DIAGNOSIS — C099 Malignant neoplasm of tonsil, unspecified: Secondary | ICD-10-CM | POA: Diagnosis not present

## 2015-08-25 DIAGNOSIS — K9423 Gastrostomy malfunction: Secondary | ICD-10-CM | POA: Diagnosis not present

## 2015-08-26 ENCOUNTER — Encounter: Payer: Medicare Other | Attending: Physical Medicine and Rehabilitation | Admitting: Registered Nurse

## 2015-08-26 ENCOUNTER — Encounter: Payer: Self-pay | Admitting: Registered Nurse

## 2015-08-26 VITALS — BP 147/71 | HR 75 | Temp 98.8°F

## 2015-08-26 DIAGNOSIS — Z5181 Encounter for therapeutic drug level monitoring: Secondary | ICD-10-CM | POA: Diagnosis not present

## 2015-08-26 DIAGNOSIS — Z79899 Other long term (current) drug therapy: Secondary | ICD-10-CM | POA: Diagnosis not present

## 2015-08-26 DIAGNOSIS — K9423 Gastrostomy malfunction: Secondary | ICD-10-CM | POA: Diagnosis not present

## 2015-08-26 DIAGNOSIS — M5416 Radiculopathy, lumbar region: Secondary | ICD-10-CM | POA: Diagnosis not present

## 2015-08-26 DIAGNOSIS — G894 Chronic pain syndrome: Secondary | ICD-10-CM | POA: Diagnosis not present

## 2015-08-26 DIAGNOSIS — C770 Secondary and unspecified malignant neoplasm of lymph nodes of head, face and neck: Secondary | ICD-10-CM | POA: Diagnosis not present

## 2015-08-26 DIAGNOSIS — Z9889 Other specified postprocedural states: Secondary | ICD-10-CM

## 2015-08-26 DIAGNOSIS — M542 Cervicalgia: Secondary | ICD-10-CM | POA: Diagnosis not present

## 2015-08-26 DIAGNOSIS — M961 Postlaminectomy syndrome, not elsewhere classified: Secondary | ICD-10-CM | POA: Diagnosis not present

## 2015-08-26 DIAGNOSIS — Z510 Encounter for antineoplastic radiation therapy: Secondary | ICD-10-CM | POA: Diagnosis not present

## 2015-08-26 DIAGNOSIS — C099 Malignant neoplasm of tonsil, unspecified: Secondary | ICD-10-CM | POA: Diagnosis not present

## 2015-08-26 MED ORDER — OXYCODONE-ACETAMINOPHEN 7.5-325 MG PO TABS: 1.0000 | ORAL_TABLET | ORAL | 0 refills | Status: DC | PRN

## 2015-08-26 MED ORDER — OXYMORPHONE HCL ER 20 MG PO TB12: 20.0000 mg | ORAL_TABLET | Freq: Two times a day (BID) | ORAL | 0 refills | Status: DC

## 2015-08-26 NOTE — Progress Notes (Signed)
Subjective:    Patient ID: Bruce Olson, male    DOB: [birth year], 60 y.o.   MRN: [id number]  HPI: [name] is a 60 year old male who returns for follow up for chronic pain and medication refill. He states his pain is located in his mid- lower back. He rates his pain 8. His current exercise regime is walking and performing stretching exercises. [name] has been diagnosed with Squamous Cell Carcinoma Stage 4, he has received six  Radiation treatments and will be starting Chemotherapy tomorrow (08/27/15).  Peg tube has been placed.His oncologist is Dr. Dewitt Hoes Porosnicu at [facility].  Pain Inventory Average Pain 8 Pain Right Now 8 My pain is sharp, stabbing and tingling  In the last 24 hours, has pain interfered with the following? General activity 8 Relation with others 8 Enjoyment of life 8 What TIME of day is your pain at its worst? all Sleep (in general) Poor  Pain is worse with: walking, sitting and standing Pain improves with: medication and TENS Relief from Meds: 8  Mobility walk without assistance use a cane use a walker how many minutes can you walk? 1/4 mile ability to climb steps?  yes do you drive?  yes  Function disabled: date disabled 05/2010  Neuro/Psych weakness numbness tremor trouble walking  Prior Studies Any changes since last visit?  yes chemo radiation and feeding tube  Physicians involved in your care Any changes since last visit?  no   Family History  Problem Relation Age of Onset  . Anesthesia problems Neg Hx   . Hypotension Neg Hx   . Malignant hyperthermia Neg Hx   . Pseudochol deficiency Neg Hx    Social History   Social History  . Marital status: Single    Spouse name: N/A  . Number of children: N/A  . Years of education: N/A   Social History Main Topics  . Smoking status: Current Every Day Smoker    Packs/day: 1.50    Years: 33.00    Types: Cigarettes  . Smokeless tobacco: Never Used  .  Alcohol use Yes     Comment: beer , q couple of months  . Drug use: No  . Sexual activity: No     Comment: back problems   Other Topics Concern  . None   Social History Narrative  . None   Past Surgical History:  Procedure Laterality Date  . BACK SURGERY  2013   Back surgery  (fusion)09/15/2010 & 2013  . BACK SURGERY  03-2013  . HARDWARE REMOVAL N/A 03/14/2013   Procedure: HARDWARE REMOVAL;  Surgeon: Eustace Moore, MD;  Location: [place] [facility];  Service: Neurosurgery;  Laterality: N/A;   Past Medical History:  Diagnosis Date  . No pertinent past medical history   . Occupational injury 2013   resulted in back injury that led to surgery   There were no vitals taken for this visit.  Opioid Risk Score:   Fall Risk Score:  `1  Depression screen PHQ 2/9  Depression screen [facility] 2/9 08/26/2015 09/22/2014 08/20/2014 04/21/2014  Decreased Interest 0 0 2 2  Down, Depressed, Hopeless 0 0 0 0  PHQ - 2 Score 0 0 2 2  Altered sleeping - - - 3  Tired, decreased energy - - - 3  Change in appetite - - - 0  Feeling bad or failure about yourself  - - - 0  Trouble concentrating - - - 2  Moving slowly  or fidgety/restless - - - 0  Suicidal thoughts - - - 0  PHQ-9 Score - - - 10     Review of Systems  All other systems reviewed and are negative.      Objective:   Physical Exam  Constitutional: He is oriented to person, place, and time. He appears well-developed and well-nourished.  HENT:  Head: Normocephalic and atraumatic.  Neck: Normal range of motion. Neck supple.  Cardiovascular: Normal rate and regular rhythm.   Pulmonary/Chest: Effort normal and breath sounds normal.  Abdominal:  LLQ with Peg Tube Placed Dressing Intact  Musculoskeletal:  Normal Muscle Bulk and Muscle Testing Reveals: Upper Extremities: Full ROM and Muscle Strength 5/5 Lumbar Paraspinal Tenderness: L-3- L-5 Lower Extremities: Full ROM and Muscle Strength 5/5 Arises from table with ease Narrow based Gait    Neurological: He is alert and oriented to person, place, and time.  Skin: Skin is warm and dry.  Psychiatric: He has a normal mood and affect.  Nursing note and vitals reviewed.         Assessment & Plan:  1. Lumbar postlaminectomy syndrome status post lumbar fusion x3: His most recent surgery was 03/14/2013. He had hardware removal and bone allograft waist and L2-3.  Refilled: oxyCODONE 7.5/325mg  one tablet every 4 hours as needed #100 and OPANA 20 mg one tablet every 12 hours #60.  We will continue the opioid monitoring program, this consists of regular clinic visits, examinations, urine drug screen, pill counts as well as use of [place] Controlled Substance Reporting System. 2. Greater Trochanteric Bursitis: No Complaints Voiced Today. Continue with heat and exercise regimen.   15 minutes of face to face patient care time was spent during this visit. All questions were encouraged and answered.   F/U in 1 month

## 2015-08-27 ENCOUNTER — Ambulatory Visit: Payer: Self-pay | Admitting: Registered Nurse

## 2015-08-27 DIAGNOSIS — Z510 Encounter for antineoplastic radiation therapy: Secondary | ICD-10-CM | POA: Diagnosis not present

## 2015-08-27 DIAGNOSIS — C099 Malignant neoplasm of tonsil, unspecified: Secondary | ICD-10-CM | POA: Diagnosis not present

## 2015-08-27 DIAGNOSIS — G8929 Other chronic pain: Secondary | ICD-10-CM | POA: Diagnosis not present

## 2015-08-27 DIAGNOSIS — C770 Secondary and unspecified malignant neoplasm of lymph nodes of head, face and neck: Secondary | ICD-10-CM | POA: Diagnosis not present

## 2015-08-27 DIAGNOSIS — R7989 Other specified abnormal findings of blood chemistry: Secondary | ICD-10-CM | POA: Diagnosis not present

## 2015-08-27 DIAGNOSIS — Z5111 Encounter for antineoplastic chemotherapy: Secondary | ICD-10-CM | POA: Diagnosis not present

## 2015-08-27 DIAGNOSIS — K9423 Gastrostomy malfunction: Secondary | ICD-10-CM | POA: Diagnosis not present

## 2015-08-28 DIAGNOSIS — C099 Malignant neoplasm of tonsil, unspecified: Secondary | ICD-10-CM | POA: Diagnosis not present

## 2015-08-28 DIAGNOSIS — C770 Secondary and unspecified malignant neoplasm of lymph nodes of head, face and neck: Secondary | ICD-10-CM | POA: Diagnosis not present

## 2015-08-28 DIAGNOSIS — K9423 Gastrostomy malfunction: Secondary | ICD-10-CM | POA: Diagnosis not present

## 2015-08-28 DIAGNOSIS — Z510 Encounter for antineoplastic radiation therapy: Secondary | ICD-10-CM | POA: Diagnosis not present

## 2015-08-31 DIAGNOSIS — K9423 Gastrostomy malfunction: Secondary | ICD-10-CM | POA: Diagnosis not present

## 2015-08-31 DIAGNOSIS — Z931 Gastrostomy status: Secondary | ICD-10-CM | POA: Diagnosis not present

## 2015-08-31 DIAGNOSIS — C770 Secondary and unspecified malignant neoplasm of lymph nodes of head, face and neck: Secondary | ICD-10-CM | POA: Diagnosis not present

## 2015-08-31 DIAGNOSIS — C099 Malignant neoplasm of tonsil, unspecified: Secondary | ICD-10-CM | POA: Diagnosis not present

## 2015-08-31 DIAGNOSIS — Z9889 Other specified postprocedural states: Secondary | ICD-10-CM | POA: Diagnosis not present

## 2015-08-31 DIAGNOSIS — Z510 Encounter for antineoplastic radiation therapy: Secondary | ICD-10-CM | POA: Diagnosis not present

## 2015-09-01 DIAGNOSIS — Z510 Encounter for antineoplastic radiation therapy: Secondary | ICD-10-CM | POA: Diagnosis not present

## 2015-09-01 DIAGNOSIS — C099 Malignant neoplasm of tonsil, unspecified: Secondary | ICD-10-CM | POA: Diagnosis not present

## 2015-09-01 DIAGNOSIS — C770 Secondary and unspecified malignant neoplasm of lymph nodes of head, face and neck: Secondary | ICD-10-CM | POA: Diagnosis not present

## 2015-09-01 DIAGNOSIS — K9423 Gastrostomy malfunction: Secondary | ICD-10-CM | POA: Diagnosis not present

## 2015-09-02 DIAGNOSIS — K9423 Gastrostomy malfunction: Secondary | ICD-10-CM | POA: Diagnosis not present

## 2015-09-02 DIAGNOSIS — Z510 Encounter for antineoplastic radiation therapy: Secondary | ICD-10-CM | POA: Diagnosis not present

## 2015-09-02 DIAGNOSIS — C099 Malignant neoplasm of tonsil, unspecified: Secondary | ICD-10-CM | POA: Diagnosis not present

## 2015-09-02 DIAGNOSIS — C770 Secondary and unspecified malignant neoplasm of lymph nodes of head, face and neck: Secondary | ICD-10-CM | POA: Diagnosis not present

## 2015-09-03 DIAGNOSIS — F17210 Nicotine dependence, cigarettes, uncomplicated: Secondary | ICD-10-CM | POA: Diagnosis not present

## 2015-09-03 DIAGNOSIS — C770 Secondary and unspecified malignant neoplasm of lymph nodes of head, face and neck: Secondary | ICD-10-CM | POA: Diagnosis not present

## 2015-09-03 DIAGNOSIS — C099 Malignant neoplasm of tonsil, unspecified: Secondary | ICD-10-CM | POA: Diagnosis not present

## 2015-09-03 DIAGNOSIS — Z931 Gastrostomy status: Secondary | ICD-10-CM | POA: Diagnosis not present

## 2015-09-03 DIAGNOSIS — Z5111 Encounter for antineoplastic chemotherapy: Secondary | ICD-10-CM | POA: Diagnosis not present

## 2015-09-03 DIAGNOSIS — Z510 Encounter for antineoplastic radiation therapy: Secondary | ICD-10-CM | POA: Diagnosis not present

## 2015-09-03 DIAGNOSIS — Z79899 Other long term (current) drug therapy: Secondary | ICD-10-CM | POA: Diagnosis not present

## 2015-09-03 DIAGNOSIS — G8929 Other chronic pain: Secondary | ICD-10-CM | POA: Diagnosis not present

## 2015-09-04 DIAGNOSIS — C770 Secondary and unspecified malignant neoplasm of lymph nodes of head, face and neck: Secondary | ICD-10-CM | POA: Diagnosis not present

## 2015-09-04 DIAGNOSIS — Z510 Encounter for antineoplastic radiation therapy: Secondary | ICD-10-CM | POA: Diagnosis not present

## 2015-09-04 DIAGNOSIS — C099 Malignant neoplasm of tonsil, unspecified: Secondary | ICD-10-CM | POA: Diagnosis not present

## 2015-09-04 DIAGNOSIS — K9423 Gastrostomy malfunction: Secondary | ICD-10-CM | POA: Diagnosis not present

## 2015-09-07 DIAGNOSIS — Z510 Encounter for antineoplastic radiation therapy: Secondary | ICD-10-CM | POA: Diagnosis not present

## 2015-09-07 DIAGNOSIS — C770 Secondary and unspecified malignant neoplasm of lymph nodes of head, face and neck: Secondary | ICD-10-CM | POA: Diagnosis not present

## 2015-09-07 DIAGNOSIS — C098 Malignant neoplasm of overlapping sites of tonsil: Secondary | ICD-10-CM | POA: Diagnosis not present

## 2015-09-07 DIAGNOSIS — R1312 Dysphagia, oropharyngeal phase: Secondary | ICD-10-CM | POA: Diagnosis not present

## 2015-09-07 DIAGNOSIS — C099 Malignant neoplasm of tonsil, unspecified: Secondary | ICD-10-CM | POA: Diagnosis not present

## 2015-09-08 DIAGNOSIS — C770 Secondary and unspecified malignant neoplasm of lymph nodes of head, face and neck: Secondary | ICD-10-CM | POA: Diagnosis not present

## 2015-09-08 DIAGNOSIS — Z431 Encounter for attention to gastrostomy: Secondary | ICD-10-CM | POA: Diagnosis not present

## 2015-09-08 DIAGNOSIS — C760 Malignant neoplasm of head, face and neck: Secondary | ICD-10-CM | POA: Diagnosis not present

## 2015-09-08 DIAGNOSIS — C099 Malignant neoplasm of tonsil, unspecified: Secondary | ICD-10-CM | POA: Diagnosis not present

## 2015-09-08 DIAGNOSIS — Z510 Encounter for antineoplastic radiation therapy: Secondary | ICD-10-CM | POA: Diagnosis not present

## 2015-09-09 DIAGNOSIS — C770 Secondary and unspecified malignant neoplasm of lymph nodes of head, face and neck: Secondary | ICD-10-CM | POA: Diagnosis not present

## 2015-09-09 DIAGNOSIS — Z510 Encounter for antineoplastic radiation therapy: Secondary | ICD-10-CM | POA: Diagnosis not present

## 2015-09-09 DIAGNOSIS — K9423 Gastrostomy malfunction: Secondary | ICD-10-CM | POA: Diagnosis not present

## 2015-09-09 DIAGNOSIS — C099 Malignant neoplasm of tonsil, unspecified: Secondary | ICD-10-CM | POA: Diagnosis not present

## 2015-09-10 DIAGNOSIS — C770 Secondary and unspecified malignant neoplasm of lymph nodes of head, face and neck: Secondary | ICD-10-CM | POA: Diagnosis not present

## 2015-09-10 DIAGNOSIS — C098 Malignant neoplasm of overlapping sites of tonsil: Secondary | ICD-10-CM | POA: Diagnosis not present

## 2015-09-10 DIAGNOSIS — C099 Malignant neoplasm of tonsil, unspecified: Secondary | ICD-10-CM | POA: Diagnosis not present

## 2015-09-10 DIAGNOSIS — F17210 Nicotine dependence, cigarettes, uncomplicated: Secondary | ICD-10-CM | POA: Diagnosis not present

## 2015-09-10 DIAGNOSIS — Z510 Encounter for antineoplastic radiation therapy: Secondary | ICD-10-CM | POA: Diagnosis not present

## 2015-09-10 DIAGNOSIS — Z5111 Encounter for antineoplastic chemotherapy: Secondary | ICD-10-CM | POA: Diagnosis not present

## 2015-09-10 DIAGNOSIS — M549 Dorsalgia, unspecified: Secondary | ICD-10-CM | POA: Diagnosis not present

## 2015-09-11 DIAGNOSIS — Z510 Encounter for antineoplastic radiation therapy: Secondary | ICD-10-CM | POA: Diagnosis not present

## 2015-09-11 DIAGNOSIS — C099 Malignant neoplasm of tonsil, unspecified: Secondary | ICD-10-CM | POA: Diagnosis not present

## 2015-09-11 DIAGNOSIS — C770 Secondary and unspecified malignant neoplasm of lymph nodes of head, face and neck: Secondary | ICD-10-CM | POA: Diagnosis not present

## 2015-09-11 DIAGNOSIS — K9423 Gastrostomy malfunction: Secondary | ICD-10-CM | POA: Diagnosis not present

## 2015-09-14 DIAGNOSIS — C099 Malignant neoplasm of tonsil, unspecified: Secondary | ICD-10-CM | POA: Diagnosis not present

## 2015-09-14 DIAGNOSIS — Z510 Encounter for antineoplastic radiation therapy: Secondary | ICD-10-CM | POA: Diagnosis not present

## 2015-09-14 DIAGNOSIS — R1312 Dysphagia, oropharyngeal phase: Secondary | ICD-10-CM | POA: Diagnosis not present

## 2015-09-14 DIAGNOSIS — C770 Secondary and unspecified malignant neoplasm of lymph nodes of head, face and neck: Secondary | ICD-10-CM | POA: Diagnosis not present

## 2015-09-14 DIAGNOSIS — C098 Malignant neoplasm of overlapping sites of tonsil: Secondary | ICD-10-CM | POA: Diagnosis not present

## 2015-09-15 DIAGNOSIS — C770 Secondary and unspecified malignant neoplasm of lymph nodes of head, face and neck: Secondary | ICD-10-CM | POA: Diagnosis not present

## 2015-09-15 DIAGNOSIS — K9423 Gastrostomy malfunction: Secondary | ICD-10-CM | POA: Diagnosis not present

## 2015-09-15 DIAGNOSIS — C099 Malignant neoplasm of tonsil, unspecified: Secondary | ICD-10-CM | POA: Diagnosis not present

## 2015-09-15 DIAGNOSIS — Z510 Encounter for antineoplastic radiation therapy: Secondary | ICD-10-CM | POA: Diagnosis not present

## 2015-09-16 DIAGNOSIS — C770 Secondary and unspecified malignant neoplasm of lymph nodes of head, face and neck: Secondary | ICD-10-CM | POA: Diagnosis not present

## 2015-09-16 DIAGNOSIS — Z510 Encounter for antineoplastic radiation therapy: Secondary | ICD-10-CM | POA: Diagnosis not present

## 2015-09-16 DIAGNOSIS — K9423 Gastrostomy malfunction: Secondary | ICD-10-CM | POA: Diagnosis not present

## 2015-09-16 DIAGNOSIS — C099 Malignant neoplasm of tonsil, unspecified: Secondary | ICD-10-CM | POA: Diagnosis not present

## 2015-09-17 DIAGNOSIS — C770 Secondary and unspecified malignant neoplasm of lymph nodes of head, face and neck: Secondary | ICD-10-CM | POA: Diagnosis not present

## 2015-09-17 DIAGNOSIS — F17210 Nicotine dependence, cigarettes, uncomplicated: Secondary | ICD-10-CM | POA: Diagnosis not present

## 2015-09-17 DIAGNOSIS — C099 Malignant neoplasm of tonsil, unspecified: Secondary | ICD-10-CM | POA: Diagnosis not present

## 2015-09-17 DIAGNOSIS — Z5111 Encounter for antineoplastic chemotherapy: Secondary | ICD-10-CM | POA: Diagnosis not present

## 2015-09-17 DIAGNOSIS — N179 Acute kidney failure, unspecified: Secondary | ICD-10-CM | POA: Diagnosis not present

## 2015-09-17 DIAGNOSIS — Z510 Encounter for antineoplastic radiation therapy: Secondary | ICD-10-CM | POA: Diagnosis not present

## 2015-09-18 DIAGNOSIS — Z510 Encounter for antineoplastic radiation therapy: Secondary | ICD-10-CM | POA: Diagnosis not present

## 2015-09-18 DIAGNOSIS — R638 Other symptoms and signs concerning food and fluid intake: Secondary | ICD-10-CM | POA: Diagnosis not present

## 2015-09-18 DIAGNOSIS — C099 Malignant neoplasm of tonsil, unspecified: Secondary | ICD-10-CM | POA: Diagnosis not present

## 2015-09-18 DIAGNOSIS — C770 Secondary and unspecified malignant neoplasm of lymph nodes of head, face and neck: Secondary | ICD-10-CM | POA: Diagnosis not present

## 2015-09-18 DIAGNOSIS — R1312 Dysphagia, oropharyngeal phase: Secondary | ICD-10-CM | POA: Diagnosis not present

## 2015-09-18 DIAGNOSIS — C098 Malignant neoplasm of overlapping sites of tonsil: Secondary | ICD-10-CM | POA: Diagnosis not present

## 2015-09-21 DIAGNOSIS — C098 Malignant neoplasm of overlapping sites of tonsil: Secondary | ICD-10-CM | POA: Diagnosis not present

## 2015-09-21 DIAGNOSIS — Z510 Encounter for antineoplastic radiation therapy: Secondary | ICD-10-CM | POA: Diagnosis not present

## 2015-09-21 DIAGNOSIS — C099 Malignant neoplasm of tonsil, unspecified: Secondary | ICD-10-CM | POA: Diagnosis not present

## 2015-09-21 DIAGNOSIS — R1312 Dysphagia, oropharyngeal phase: Secondary | ICD-10-CM | POA: Diagnosis not present

## 2015-09-21 DIAGNOSIS — C770 Secondary and unspecified malignant neoplasm of lymph nodes of head, face and neck: Secondary | ICD-10-CM | POA: Diagnosis not present

## 2015-09-22 DIAGNOSIS — Z510 Encounter for antineoplastic radiation therapy: Secondary | ICD-10-CM | POA: Diagnosis not present

## 2015-09-22 DIAGNOSIS — C099 Malignant neoplasm of tonsil, unspecified: Secondary | ICD-10-CM | POA: Diagnosis not present

## 2015-09-22 DIAGNOSIS — K9423 Gastrostomy malfunction: Secondary | ICD-10-CM | POA: Diagnosis not present

## 2015-09-22 DIAGNOSIS — C770 Secondary and unspecified malignant neoplasm of lymph nodes of head, face and neck: Secondary | ICD-10-CM | POA: Diagnosis not present

## 2015-09-23 DIAGNOSIS — K9423 Gastrostomy malfunction: Secondary | ICD-10-CM | POA: Diagnosis not present

## 2015-09-23 DIAGNOSIS — C099 Malignant neoplasm of tonsil, unspecified: Secondary | ICD-10-CM | POA: Diagnosis not present

## 2015-09-23 DIAGNOSIS — Z510 Encounter for antineoplastic radiation therapy: Secondary | ICD-10-CM | POA: Diagnosis not present

## 2015-09-23 DIAGNOSIS — C770 Secondary and unspecified malignant neoplasm of lymph nodes of head, face and neck: Secondary | ICD-10-CM | POA: Diagnosis not present

## 2015-09-24 DIAGNOSIS — M549 Dorsalgia, unspecified: Secondary | ICD-10-CM | POA: Diagnosis not present

## 2015-09-24 DIAGNOSIS — C770 Secondary and unspecified malignant neoplasm of lymph nodes of head, face and neck: Secondary | ICD-10-CM | POA: Diagnosis not present

## 2015-09-24 DIAGNOSIS — R1312 Dysphagia, oropharyngeal phase: Secondary | ICD-10-CM | POA: Diagnosis not present

## 2015-09-24 DIAGNOSIS — G8929 Other chronic pain: Secondary | ICD-10-CM | POA: Diagnosis not present

## 2015-09-24 DIAGNOSIS — N179 Acute kidney failure, unspecified: Secondary | ICD-10-CM | POA: Diagnosis not present

## 2015-09-24 DIAGNOSIS — Z5111 Encounter for antineoplastic chemotherapy: Secondary | ICD-10-CM | POA: Diagnosis not present

## 2015-09-24 DIAGNOSIS — L589 Radiodermatitis, unspecified: Secondary | ICD-10-CM | POA: Diagnosis not present

## 2015-09-24 DIAGNOSIS — Z510 Encounter for antineoplastic radiation therapy: Secondary | ICD-10-CM | POA: Diagnosis not present

## 2015-09-24 DIAGNOSIS — F17210 Nicotine dependence, cigarettes, uncomplicated: Secondary | ICD-10-CM | POA: Diagnosis not present

## 2015-09-24 DIAGNOSIS — C099 Malignant neoplasm of tonsil, unspecified: Secondary | ICD-10-CM | POA: Diagnosis not present

## 2015-09-25 DIAGNOSIS — C770 Secondary and unspecified malignant neoplasm of lymph nodes of head, face and neck: Secondary | ICD-10-CM | POA: Diagnosis not present

## 2015-09-25 DIAGNOSIS — Z510 Encounter for antineoplastic radiation therapy: Secondary | ICD-10-CM | POA: Diagnosis not present

## 2015-09-25 DIAGNOSIS — K9423 Gastrostomy malfunction: Secondary | ICD-10-CM | POA: Diagnosis not present

## 2015-09-25 DIAGNOSIS — C099 Malignant neoplasm of tonsil, unspecified: Secondary | ICD-10-CM | POA: Diagnosis not present

## 2015-09-28 DIAGNOSIS — C099 Malignant neoplasm of tonsil, unspecified: Secondary | ICD-10-CM | POA: Diagnosis not present

## 2015-09-28 DIAGNOSIS — Z510 Encounter for antineoplastic radiation therapy: Secondary | ICD-10-CM | POA: Diagnosis not present

## 2015-09-28 DIAGNOSIS — K9423 Gastrostomy malfunction: Secondary | ICD-10-CM | POA: Diagnosis not present

## 2015-09-28 DIAGNOSIS — C770 Secondary and unspecified malignant neoplasm of lymph nodes of head, face and neck: Secondary | ICD-10-CM | POA: Diagnosis not present

## 2015-09-29 DIAGNOSIS — C099 Malignant neoplasm of tonsil, unspecified: Secondary | ICD-10-CM | POA: Diagnosis not present

## 2015-09-29 DIAGNOSIS — Z510 Encounter for antineoplastic radiation therapy: Secondary | ICD-10-CM | POA: Diagnosis not present

## 2015-09-29 DIAGNOSIS — C770 Secondary and unspecified malignant neoplasm of lymph nodes of head, face and neck: Secondary | ICD-10-CM | POA: Diagnosis not present

## 2015-09-29 DIAGNOSIS — K9423 Gastrostomy malfunction: Secondary | ICD-10-CM | POA: Diagnosis not present

## 2015-09-30 ENCOUNTER — Encounter: Payer: Self-pay | Admitting: Registered Nurse

## 2015-09-30 ENCOUNTER — Encounter: Payer: Medicare Other | Attending: Physical Medicine and Rehabilitation | Admitting: Registered Nurse

## 2015-09-30 VITALS — BP 104/61 | HR 82

## 2015-09-30 DIAGNOSIS — C770 Secondary and unspecified malignant neoplasm of lymph nodes of head, face and neck: Secondary | ICD-10-CM | POA: Diagnosis not present

## 2015-09-30 DIAGNOSIS — Z5181 Encounter for therapeutic drug level monitoring: Secondary | ICD-10-CM | POA: Diagnosis not present

## 2015-09-30 DIAGNOSIS — M961 Postlaminectomy syndrome, not elsewhere classified: Secondary | ICD-10-CM | POA: Diagnosis not present

## 2015-09-30 DIAGNOSIS — G894 Chronic pain syndrome: Secondary | ICD-10-CM

## 2015-09-30 DIAGNOSIS — K9423 Gastrostomy malfunction: Secondary | ICD-10-CM | POA: Diagnosis not present

## 2015-09-30 DIAGNOSIS — Z510 Encounter for antineoplastic radiation therapy: Secondary | ICD-10-CM | POA: Diagnosis not present

## 2015-09-30 DIAGNOSIS — Z9889 Other specified postprocedural states: Secondary | ICD-10-CM | POA: Diagnosis not present

## 2015-09-30 DIAGNOSIS — M542 Cervicalgia: Secondary | ICD-10-CM | POA: Diagnosis not present

## 2015-09-30 DIAGNOSIS — M5416 Radiculopathy, lumbar region: Secondary | ICD-10-CM

## 2015-09-30 DIAGNOSIS — C099 Malignant neoplasm of tonsil, unspecified: Secondary | ICD-10-CM | POA: Diagnosis not present

## 2015-09-30 DIAGNOSIS — Z79899 Other long term (current) drug therapy: Secondary | ICD-10-CM

## 2015-09-30 MED ORDER — OXYMORPHONE HCL ER 20 MG PO TB12: 20.0000 mg | ORAL_TABLET | Freq: Two times a day (BID) | ORAL | 0 refills | Status: DC

## 2015-09-30 MED ORDER — PREGABALIN 100 MG PO CAPS: 100.0000 mg | ORAL_CAPSULE | Freq: Three times a day (TID) | ORAL | 5 refills | Status: DC

## 2015-09-30 MED ORDER — OXYCODONE-ACETAMINOPHEN 7.5-325 MG PO TABS: 1.0000 | ORAL_TABLET | ORAL | 0 refills | Status: DC | PRN

## 2015-10-01 ENCOUNTER — Encounter: Payer: Self-pay | Admitting: Registered Nurse

## 2015-10-01 DIAGNOSIS — F17210 Nicotine dependence, cigarettes, uncomplicated: Secondary | ICD-10-CM | POA: Diagnosis not present

## 2015-10-01 DIAGNOSIS — Z931 Gastrostomy status: Secondary | ICD-10-CM | POA: Diagnosis not present

## 2015-10-01 DIAGNOSIS — C099 Malignant neoplasm of tonsil, unspecified: Secondary | ICD-10-CM | POA: Diagnosis not present

## 2015-10-01 DIAGNOSIS — G8929 Other chronic pain: Secondary | ICD-10-CM | POA: Diagnosis not present

## 2015-10-01 DIAGNOSIS — Z510 Encounter for antineoplastic radiation therapy: Secondary | ICD-10-CM | POA: Diagnosis not present

## 2015-10-01 DIAGNOSIS — C770 Secondary and unspecified malignant neoplasm of lymph nodes of head, face and neck: Secondary | ICD-10-CM | POA: Diagnosis not present

## 2015-10-01 DIAGNOSIS — M549 Dorsalgia, unspecified: Secondary | ICD-10-CM | POA: Diagnosis not present

## 2015-10-01 DIAGNOSIS — R7989 Other specified abnormal findings of blood chemistry: Secondary | ICD-10-CM | POA: Diagnosis not present

## 2015-10-01 DIAGNOSIS — Z720 Tobacco use: Secondary | ICD-10-CM | POA: Diagnosis not present

## 2015-10-01 DIAGNOSIS — Z5111 Encounter for antineoplastic chemotherapy: Secondary | ICD-10-CM | POA: Diagnosis not present

## 2015-10-01 NOTE — Progress Notes (Signed)
Subjective:    Patient ID: Bruce Olson, male    DOB: 1955-12-30, 60 y.o.   MRN: [id number]  HPI: [name] is a 60 year old male who returns for follow up for chronic pain and medication refill. He states his pain is located in his mid- lower back. He rates his pain 8. His current exercise regime is walking and performing stretching exercises.  Mr. [name] has been diagnosed with Squamous Cell Carcinoma Stage 4, he's receiving   Radiation and Chemotherapy.  Peg tube scheduled to be removed on 10/01/2015 His oncologist is Dr. Dewitt Hoes Porosnicu at [facility].  Pain Inventory Average Pain 8 Pain Right Now 8 My pain is sharp, stabbing, tingling and aching  In the last 24 hours, has pain interfered with the following? General activity 8 Relation with others 8 Enjoyment of life 8 What TIME of day is your pain at its worst? morning, daytime, evening, night Sleep (in general) Poor  Pain is worse with: walking, sitting and standing Pain improves with: medication and TENS Relief from Meds: 8  Mobility walk without assistance use a cane how many minutes can you walk? 1/4 mile ability to climb steps?  yes do you drive?  yes  Function disabled: date disabled 05/2010  Neuro/Psych weakness numbness tremor trouble walking  Prior Studies Any changes since last visit?  no  Physicians involved in your care Any changes since last visit?  no   Family History  Problem Relation Age of Onset  . Anesthesia problems Neg Hx   . Hypotension Neg Hx   . Malignant hyperthermia Neg Hx   . Pseudochol deficiency Neg Hx    Social History   Social History  . Marital status: Single    Spouse name: N/A  . Number of children: N/A  . Years of education: N/A   Social History Main Topics  . Smoking status: Current Every Day Smoker    Packs/day: 1.50    Years: 33.00    Types: Cigarettes  . Smokeless tobacco: Never Used  . Alcohol use Yes     Comment: beer , q  couple of months  . Drug use: No  . Sexual activity: No     Comment: back problems   Other Topics Concern  . None   Social History Narrative  . None   Past Surgical History:  Procedure Laterality Date  . BACK SURGERY  2013   Back surgery  (fusion)09/15/2010 & 2013  . BACK SURGERY  03-2013  . HARDWARE REMOVAL N/A 03/14/2013   Procedure: HARDWARE REMOVAL;  Surgeon: Eustace Moore, MD;  Location: [place] [facility];  Service: Neurosurgery;  Laterality: N/A;   Past Medical History:  Diagnosis Date  . No pertinent past medical history   . Occupational injury 2013   resulted in back injury that led to surgery   BP 104/61 (BP Location: Right Arm, Patient Position: Sitting)   Pulse 82   SpO2 97%   Opioid Risk Score:   Fall Risk Score:  `1  Depression screen PHQ 2/9  Depression screen [facility] 2/9 08/26/2015 09/22/2014 08/20/2014 04/21/2014  Decreased Interest 0 0 2 2  Down, Depressed, Hopeless 0 0 0 0  PHQ - 2 Score 0 0 2 2  Altered sleeping - - - 3  Tired, decreased energy - - - 3  Change in appetite - - - 0  Feeling bad or failure about yourself  - - - 0  Trouble concentrating - - -  2  Moving slowly or fidgety/restless - - - 0  Suicidal thoughts - - - 0  PHQ-9 Score - - - 10    Review of Systems  Musculoskeletal: Positive for gait problem.  Neurological: Positive for tremors, weakness and numbness.  All other systems reviewed and are negative.      Objective:   Physical Exam  Constitutional: He is oriented to person, place, and time. He appears well-developed and well-nourished.  HENT:  Head: Normocephalic and atraumatic.  Neck: Normal range of motion. Neck supple.  Cardiovascular: Normal rate and regular rhythm.   Pulmonary/Chest: Effort normal and breath sounds normal.  Musculoskeletal:  Normal Muscle Bulk and Muscle Testing Reveals: Upper Extremities: Full ROM and Muscle Strength 5/5 Thoracic and Lumbar Hypersensitivity Lower Extremities: Full ROM and Muscle Strength  5/5 Arises from Table with ease Narrow Based Gait  Neurological: He is alert and oriented to person, place, and time.  Skin: Skin is warm and dry.  Psychiatric: He has a normal mood and affect.  Nursing note and vitals reviewed.         Assessment & Plan:  1. Lumbar postlaminectomy syndrome status post lumbar fusion x3: His most recent surgery was 03/14/2013. He had hardware removal and bone allograft waist and L2-3.  Refilled: oxyCODONE 7.5/325mg  one tablet every 4 hours as needed #100 and OPANA 20 mg one tablet every 12 hours #60.  We will continue the opioid monitoring program, this consists of regular clinic visits, examinations, urine drug screen, pill counts as well as use of [place] Controlled Substance Reporting System.  15 minutes of face to face patient care time was spent during this visit. All questions were encouraged and answered.   F/U in 1 month

## 2015-10-02 DIAGNOSIS — C770 Secondary and unspecified malignant neoplasm of lymph nodes of head, face and neck: Secondary | ICD-10-CM | POA: Diagnosis not present

## 2015-10-02 DIAGNOSIS — C7989 Secondary malignant neoplasm of other specified sites: Secondary | ICD-10-CM | POA: Diagnosis not present

## 2015-10-02 DIAGNOSIS — Z9221 Personal history of antineoplastic chemotherapy: Secondary | ICD-10-CM | POA: Diagnosis not present

## 2015-10-02 DIAGNOSIS — C099 Malignant neoplasm of tonsil, unspecified: Secondary | ICD-10-CM | POA: Diagnosis not present

## 2015-10-02 DIAGNOSIS — Z510 Encounter for antineoplastic radiation therapy: Secondary | ICD-10-CM | POA: Diagnosis not present

## 2015-10-03 DIAGNOSIS — R109 Unspecified abdominal pain: Secondary | ICD-10-CM | POA: Diagnosis not present

## 2015-10-03 DIAGNOSIS — F17210 Nicotine dependence, cigarettes, uncomplicated: Secondary | ICD-10-CM | POA: Diagnosis not present

## 2015-10-03 DIAGNOSIS — K9422 Gastrostomy infection: Secondary | ICD-10-CM | POA: Diagnosis not present

## 2015-10-06 DIAGNOSIS — Z510 Encounter for antineoplastic radiation therapy: Secondary | ICD-10-CM | POA: Diagnosis not present

## 2015-10-06 DIAGNOSIS — C770 Secondary and unspecified malignant neoplasm of lymph nodes of head, face and neck: Secondary | ICD-10-CM | POA: Diagnosis not present

## 2015-10-06 DIAGNOSIS — C7989 Secondary malignant neoplasm of other specified sites: Secondary | ICD-10-CM | POA: Diagnosis not present

## 2015-10-06 DIAGNOSIS — C099 Malignant neoplasm of tonsil, unspecified: Secondary | ICD-10-CM | POA: Diagnosis not present

## 2015-10-06 DIAGNOSIS — Z9221 Personal history of antineoplastic chemotherapy: Secondary | ICD-10-CM | POA: Diagnosis not present

## 2015-10-07 DIAGNOSIS — K316 Fistula of stomach and duodenum: Secondary | ICD-10-CM | POA: Diagnosis not present

## 2015-10-07 DIAGNOSIS — Z510 Encounter for antineoplastic radiation therapy: Secondary | ICD-10-CM | POA: Diagnosis not present

## 2015-10-07 DIAGNOSIS — Z931 Gastrostomy status: Secondary | ICD-10-CM | POA: Diagnosis not present

## 2015-10-07 DIAGNOSIS — C099 Malignant neoplasm of tonsil, unspecified: Secondary | ICD-10-CM | POA: Diagnosis not present

## 2015-10-07 DIAGNOSIS — C770 Secondary and unspecified malignant neoplasm of lymph nodes of head, face and neck: Secondary | ICD-10-CM | POA: Diagnosis not present

## 2015-10-07 DIAGNOSIS — M545 Low back pain: Secondary | ICD-10-CM | POA: Diagnosis not present

## 2015-10-07 DIAGNOSIS — K9420 Gastrostomy complication, unspecified: Secondary | ICD-10-CM | POA: Diagnosis not present

## 2015-10-08 DIAGNOSIS — K9420 Gastrostomy complication, unspecified: Secondary | ICD-10-CM | POA: Diagnosis not present

## 2015-10-08 DIAGNOSIS — K316 Fistula of stomach and duodenum: Secondary | ICD-10-CM | POA: Diagnosis not present

## 2015-10-09 DIAGNOSIS — K2950 Unspecified chronic gastritis without bleeding: Secondary | ICD-10-CM | POA: Diagnosis not present

## 2015-10-09 DIAGNOSIS — K316 Fistula of stomach and duodenum: Secondary | ICD-10-CM | POA: Diagnosis not present

## 2015-10-10 DIAGNOSIS — D72819 Decreased white blood cell count, unspecified: Secondary | ICD-10-CM | POA: Diagnosis present

## 2015-10-10 DIAGNOSIS — K668 Other specified disorders of peritoneum: Secondary | ICD-10-CM | POA: Diagnosis not present

## 2015-10-10 DIAGNOSIS — B9681 Helicobacter pylori [H. pylori] as the cause of diseases classified elsewhere: Secondary | ICD-10-CM | POA: Diagnosis present

## 2015-10-10 DIAGNOSIS — J189 Pneumonia, unspecified organism: Secondary | ICD-10-CM | POA: Diagnosis not present

## 2015-10-10 DIAGNOSIS — E870 Hyperosmolality and hypernatremia: Secondary | ICD-10-CM | POA: Diagnosis not present

## 2015-10-10 DIAGNOSIS — Z4659 Encounter for fitting and adjustment of other gastrointestinal appliance and device: Secondary | ICD-10-CM | POA: Diagnosis not present

## 2015-10-10 DIAGNOSIS — K922 Gastrointestinal hemorrhage, unspecified: Secondary | ICD-10-CM | POA: Diagnosis not present

## 2015-10-10 DIAGNOSIS — J9600 Acute respiratory failure, unspecified whether with hypoxia or hypercapnia: Secondary | ICD-10-CM | POA: Diagnosis not present

## 2015-10-10 DIAGNOSIS — J982 Interstitial emphysema: Secondary | ICD-10-CM | POA: Diagnosis not present

## 2015-10-10 DIAGNOSIS — J9690 Respiratory failure, unspecified, unspecified whether with hypoxia or hypercapnia: Secondary | ICD-10-CM | POA: Diagnosis not present

## 2015-10-10 DIAGNOSIS — R0689 Other abnormalities of breathing: Secondary | ICD-10-CM | POA: Diagnosis not present

## 2015-10-10 DIAGNOSIS — K316 Fistula of stomach and duodenum: Secondary | ICD-10-CM | POA: Diagnosis not present

## 2015-10-10 DIAGNOSIS — R1312 Dysphagia, oropharyngeal phase: Secondary | ICD-10-CM | POA: Diagnosis not present

## 2015-10-10 DIAGNOSIS — J9601 Acute respiratory failure with hypoxia: Secondary | ICD-10-CM | POA: Diagnosis not present

## 2015-10-10 DIAGNOSIS — M549 Dorsalgia, unspecified: Secondary | ICD-10-CM | POA: Diagnosis not present

## 2015-10-10 DIAGNOSIS — G8929 Other chronic pain: Secondary | ICD-10-CM | POA: Diagnosis not present

## 2015-10-10 DIAGNOSIS — Z681 Body mass index (BMI) 19 or less, adult: Secondary | ICD-10-CM | POA: Diagnosis not present

## 2015-10-10 DIAGNOSIS — I499 Cardiac arrhythmia, unspecified: Secondary | ICD-10-CM | POA: Diagnosis not present

## 2015-10-10 DIAGNOSIS — R918 Other nonspecific abnormal finding of lung field: Secondary | ICD-10-CM | POA: Diagnosis not present

## 2015-10-10 DIAGNOSIS — N179 Acute kidney failure, unspecified: Secondary | ICD-10-CM | POA: Diagnosis not present

## 2015-10-10 DIAGNOSIS — J90 Pleural effusion, not elsewhere classified: Secondary | ICD-10-CM | POA: Diagnosis not present

## 2015-10-10 DIAGNOSIS — Z452 Encounter for adjustment and management of vascular access device: Secondary | ICD-10-CM | POA: Diagnosis not present

## 2015-10-10 DIAGNOSIS — E876 Hypokalemia: Secondary | ICD-10-CM | POA: Diagnosis not present

## 2015-10-10 DIAGNOSIS — K9423 Gastrostomy malfunction: Secondary | ICD-10-CM | POA: Diagnosis not present

## 2015-10-10 DIAGNOSIS — R739 Hyperglycemia, unspecified: Secondary | ICD-10-CM | POA: Diagnosis not present

## 2015-10-10 DIAGNOSIS — Z8711 Personal history of peptic ulcer disease: Secondary | ICD-10-CM | POA: Diagnosis not present

## 2015-10-10 DIAGNOSIS — K259 Gastric ulcer, unspecified as acute or chronic, without hemorrhage or perforation: Secondary | ICD-10-CM | POA: Diagnosis not present

## 2015-10-10 DIAGNOSIS — F17210 Nicotine dependence, cigarettes, uncomplicated: Secondary | ICD-10-CM | POA: Diagnosis present

## 2015-10-10 DIAGNOSIS — E46 Unspecified protein-calorie malnutrition: Secondary | ICD-10-CM | POA: Diagnosis not present

## 2015-10-10 DIAGNOSIS — R000 Tachycardia, unspecified: Secondary | ICD-10-CM | POA: Diagnosis not present

## 2015-10-10 DIAGNOSIS — Y95 Nosocomial condition: Secondary | ICD-10-CM | POA: Diagnosis not present

## 2015-10-10 DIAGNOSIS — C099 Malignant neoplasm of tonsil, unspecified: Secondary | ICD-10-CM | POA: Diagnosis not present

## 2015-10-10 DIAGNOSIS — Z7982 Long term (current) use of aspirin: Secondary | ICD-10-CM | POA: Diagnosis not present

## 2015-10-10 DIAGNOSIS — K920 Hematemesis: Secondary | ICD-10-CM | POA: Diagnosis not present

## 2015-10-10 DIAGNOSIS — L598 Other specified disorders of the skin and subcutaneous tissue related to radiation: Secondary | ICD-10-CM | POA: Diagnosis not present

## 2015-10-10 DIAGNOSIS — Z79899 Other long term (current) drug therapy: Secondary | ICD-10-CM | POA: Diagnosis not present

## 2015-10-10 DIAGNOSIS — Z79891 Long term (current) use of opiate analgesic: Secondary | ICD-10-CM | POA: Diagnosis not present

## 2015-10-10 DIAGNOSIS — K632 Fistula of intestine: Secondary | ICD-10-CM | POA: Diagnosis not present

## 2015-10-10 DIAGNOSIS — E878 Other disorders of electrolyte and fluid balance, not elsewhere classified: Secondary | ICD-10-CM | POA: Diagnosis not present

## 2015-10-10 DIAGNOSIS — I10 Essential (primary) hypertension: Secondary | ICD-10-CM | POA: Diagnosis not present

## 2015-10-19 DIAGNOSIS — R1312 Dysphagia, oropharyngeal phase: Secondary | ICD-10-CM | POA: Diagnosis not present

## 2015-10-19 DIAGNOSIS — G8929 Other chronic pain: Secondary | ICD-10-CM | POA: Diagnosis not present

## 2015-10-19 DIAGNOSIS — C098 Malignant neoplasm of overlapping sites of tonsil: Secondary | ICD-10-CM | POA: Diagnosis not present

## 2015-10-19 DIAGNOSIS — M545 Low back pain: Secondary | ICD-10-CM | POA: Diagnosis not present

## 2015-10-19 DIAGNOSIS — K316 Fistula of stomach and duodenum: Secondary | ICD-10-CM | POA: Diagnosis not present

## 2015-10-19 DIAGNOSIS — K9429 Other complications of gastrostomy: Secondary | ICD-10-CM | POA: Diagnosis not present

## 2015-10-20 DIAGNOSIS — C098 Malignant neoplasm of overlapping sites of tonsil: Secondary | ICD-10-CM | POA: Diagnosis not present

## 2015-10-20 DIAGNOSIS — K9429 Other complications of gastrostomy: Secondary | ICD-10-CM | POA: Diagnosis not present

## 2015-10-20 DIAGNOSIS — M545 Low back pain: Secondary | ICD-10-CM | POA: Diagnosis not present

## 2015-10-20 DIAGNOSIS — R1312 Dysphagia, oropharyngeal phase: Secondary | ICD-10-CM | POA: Diagnosis not present

## 2015-10-20 DIAGNOSIS — G8929 Other chronic pain: Secondary | ICD-10-CM | POA: Diagnosis not present

## 2015-10-20 DIAGNOSIS — K316 Fistula of stomach and duodenum: Secondary | ICD-10-CM | POA: Diagnosis not present

## 2015-10-22 DIAGNOSIS — K9429 Other complications of gastrostomy: Secondary | ICD-10-CM | POA: Diagnosis not present

## 2015-10-22 DIAGNOSIS — C098 Malignant neoplasm of overlapping sites of tonsil: Secondary | ICD-10-CM | POA: Diagnosis not present

## 2015-10-22 DIAGNOSIS — K316 Fistula of stomach and duodenum: Secondary | ICD-10-CM | POA: Diagnosis not present

## 2015-10-22 DIAGNOSIS — M545 Low back pain: Secondary | ICD-10-CM | POA: Diagnosis not present

## 2015-10-22 DIAGNOSIS — R1312 Dysphagia, oropharyngeal phase: Secondary | ICD-10-CM | POA: Diagnosis not present

## 2015-10-22 DIAGNOSIS — G8929 Other chronic pain: Secondary | ICD-10-CM | POA: Diagnosis not present

## 2015-10-23 DIAGNOSIS — C098 Malignant neoplasm of overlapping sites of tonsil: Secondary | ICD-10-CM | POA: Diagnosis not present

## 2015-10-23 DIAGNOSIS — K316 Fistula of stomach and duodenum: Secondary | ICD-10-CM | POA: Diagnosis not present

## 2015-10-23 DIAGNOSIS — M545 Low back pain: Secondary | ICD-10-CM | POA: Diagnosis not present

## 2015-10-23 DIAGNOSIS — K9429 Other complications of gastrostomy: Secondary | ICD-10-CM | POA: Diagnosis not present

## 2015-10-23 DIAGNOSIS — R1312 Dysphagia, oropharyngeal phase: Secondary | ICD-10-CM | POA: Diagnosis not present

## 2015-10-23 DIAGNOSIS — G8929 Other chronic pain: Secondary | ICD-10-CM | POA: Diagnosis not present

## 2015-10-26 DIAGNOSIS — K316 Fistula of stomach and duodenum: Secondary | ICD-10-CM | POA: Diagnosis not present

## 2015-10-26 DIAGNOSIS — K9429 Other complications of gastrostomy: Secondary | ICD-10-CM | POA: Diagnosis not present

## 2015-10-26 DIAGNOSIS — M545 Low back pain: Secondary | ICD-10-CM | POA: Diagnosis not present

## 2015-10-26 DIAGNOSIS — R1312 Dysphagia, oropharyngeal phase: Secondary | ICD-10-CM | POA: Diagnosis not present

## 2015-10-26 DIAGNOSIS — G8929 Other chronic pain: Secondary | ICD-10-CM | POA: Diagnosis not present

## 2015-10-26 DIAGNOSIS — C098 Malignant neoplasm of overlapping sites of tonsil: Secondary | ICD-10-CM | POA: Diagnosis not present

## 2015-10-28 ENCOUNTER — Encounter: Payer: Medicare Other | Admitting: Registered Nurse

## 2015-10-28 ENCOUNTER — Encounter: Payer: Self-pay | Admitting: Registered Nurse

## 2015-10-28 ENCOUNTER — Encounter: Payer: Medicare Other | Attending: Physical Medicine and Rehabilitation | Admitting: Registered Nurse

## 2015-10-28 VITALS — BP 112/63 | HR 98

## 2015-10-28 DIAGNOSIS — M5416 Radiculopathy, lumbar region: Secondary | ICD-10-CM | POA: Diagnosis not present

## 2015-10-28 DIAGNOSIS — Z5181 Encounter for therapeutic drug level monitoring: Secondary | ICD-10-CM | POA: Insufficient documentation

## 2015-10-28 DIAGNOSIS — G894 Chronic pain syndrome: Secondary | ICD-10-CM

## 2015-10-28 DIAGNOSIS — Z79899 Other long term (current) drug therapy: Secondary | ICD-10-CM | POA: Insufficient documentation

## 2015-10-28 DIAGNOSIS — Z9889 Other specified postprocedural states: Secondary | ICD-10-CM | POA: Insufficient documentation

## 2015-10-28 DIAGNOSIS — M542 Cervicalgia: Secondary | ICD-10-CM | POA: Diagnosis not present

## 2015-10-28 DIAGNOSIS — M961 Postlaminectomy syndrome, not elsewhere classified: Secondary | ICD-10-CM | POA: Insufficient documentation

## 2015-10-28 MED ORDER — OXYMORPHONE HCL ER 20 MG PO TB12: 20.0000 mg | ORAL_TABLET | Freq: Two times a day (BID) | ORAL | 0 refills | Status: DC

## 2015-10-28 MED ORDER — OXYCODONE-ACETAMINOPHEN 7.5-325 MG PO TABS: 1.0000 | ORAL_TABLET | ORAL | 0 refills | Status: DC | PRN

## 2015-10-28 NOTE — Progress Notes (Signed)
Subjective:    Patient ID: Bruce Olson, male    DOB: Dec 04, 1955, 60 y.o.   MRN: [id number]  HPI:  Bruce Olson is a 60 year old male who returns for follow up for chronic pain and medication refill. He states his pain is located in his lower back radiating into right lower extremity. Also states he's having increased intensity of numbness in right lower extremity from groin to knee.He rates his pain 8. His current exercise regime is physical therapy twice a week.  Mr. Stoia went to [facility] on 10/03/15 for abdominal pain with inflammation at PEG tube site and discharged. On 10/07/2015 Mr. Baz was admitted to [facility], they performed an Enterocutaneous Fistula Takedown by Dr. Marissa Calamity, abdominal dressing intact. Receiving wound care from [facility] twice a week.   Mr. Anspach been diagnosed with Squamous Cell Carcinoma Stage 4, he's receiving  Radiation and Chemotherapy. Peg tube scheduled to be removed on 10/01/2015 His oncologist is Dr. Dewitt Hoes Porosnicu at [facility].  Pain Inventory Average Pain 8 Pain Right Now 8 My pain is sharp, stabbing and tingling  In the last 24 hours, has pain interfered with the following? General activity 8 Relation with others 8 Enjoyment of life 8 What TIME of day is your pain at its worst? ALL Sleep (in general) Poor  Pain is worse with: walking, sitting and standing Pain improves with: medication and TENS Relief from Meds: 8  Mobility walk without assistance use a cane use a walker how many minutes can you walk? 10 mins ability to climb steps?  yes do you drive?  yes Do you have any goals in this area?  no  Function disabled: date disabled 05/2010 Do you have any goals in this area?  no  Neuro/Psych weakness numbness tingling trouble walking  Prior Studies Any changes since last visit?  no  Physicians involved in your  care Any changes since last visit?  no Primary care Sherley Bounds, MD   Family History  Problem Relation Age of Onset  . Anesthesia problems Neg Hx   . Hypotension Neg Hx   . Malignant hyperthermia Neg Hx   . Pseudochol deficiency Neg Hx    Social History   Social History  . Marital status: Single    Spouse name: N/A  . Number of children: N/A  . Years of education: N/A   Social History Main Topics  . Smoking status: Current Every Day Smoker    Packs/day: 1.50    Years: 33.00    Types: Cigarettes  . Smokeless tobacco: Never Used  . Alcohol use Yes     Comment: beer , q couple of months  . Drug use: No  . Sexual activity: No     Comment: back problems   Other Topics Concern  . Not on file   Social History Narrative  . No narrative on file   Past Surgical History:  Procedure Laterality Date  . BACK SURGERY  2013   Back surgery  (fusion)09/15/2010 & 2013  . BACK SURGERY  03-2013  . HARDWARE REMOVAL N/A 03/14/2013   Procedure: HARDWARE REMOVAL;  Surgeon: Eustace Moore, MD;  Location: [place] [facility];  Service: Neurosurgery;  Laterality: N/A;   Past Medical History:  Diagnosis Date  . No pertinent past medical history   . Occupational injury 2013   resulted in back injury that led to surgery   There were no  vitals taken for this visit.  Opioid Risk Score:   Fall Risk Score:  `1  Depression screen PHQ 2/9  Depression screen [facility] 2/9 08/26/2015 09/22/2014 08/20/2014 04/21/2014  Decreased Interest 0 0 2 2  Down, Depressed, Hopeless 0 0 0 0  PHQ - 2 Score 0 0 2 2  Altered sleeping - - - 3  Tired, decreased energy - - - 3  Change in appetite - - - 0  Feeling bad or failure about yourself  - - - 0  Trouble concentrating - - - 2  Moving slowly or fidgety/restless - - - 0  Suicidal thoughts - - - 0  PHQ-9 Score - - - 10    Review of Systems  Neurological: Positive for tremors, weakness and numbness.  All other systems reviewed and are negative.      Objective:    Physical Exam  Constitutional: He is oriented to person, place, and time. He appears well-developed and well-nourished.  HENT:  Head: Normocephalic and atraumatic.  Neck: Normal range of motion. Neck supple.  Cardiovascular: Normal rate and regular rhythm.   Pulmonary/Chest: Effort normal and breath sounds normal.  Abdominal:  Abdominal dressing intact  Musculoskeletal: He exhibits edema.  Muscle Waisting Noted and Muscle Strength Reveals: Upper Extremities: Full ROM and Muscle Strength 5/5 Lumbar Hypersensitivity Lower Extremities: Full ROM and Muscle Strength 5/5 Pitting Edema Noted Arises from Table slowly using walker for support Narrow Based Gait  Neurological: He is alert and oriented to person, place, and time.  Skin: Skin is warm and dry.  Psychiatric: He has a normal mood and affect.  Nursing note and vitals reviewed.         Assessment & Plan:  1. Lumbar postlaminectomy syndrome status post lumbar fusion x3: His most recent surgery was 03/14/2013. He had hardware removal and bone allograft waist and L2-3.  Refilled: oxyCODONE 7.5/325mg  one tablet every 4 hours as needed #100 and OPANA 20 mg one tablet every 12 hours #60.  We will continue the opioid monitoring program, this consists of regular clinic visits, examinations, urine drug screen, pill counts as well as use of [place] Controlled Substance Reporting System. 2. Lumbar Radiculopathy: Continue Lyrica  25 minutes of face to face patient care time was spent during this visit. All questions were encouraged and answered.   F/U in 1 month

## 2015-10-29 DIAGNOSIS — R1312 Dysphagia, oropharyngeal phase: Secondary | ICD-10-CM | POA: Diagnosis not present

## 2015-10-29 DIAGNOSIS — K316 Fistula of stomach and duodenum: Secondary | ICD-10-CM | POA: Diagnosis not present

## 2015-10-29 DIAGNOSIS — G8929 Other chronic pain: Secondary | ICD-10-CM | POA: Diagnosis not present

## 2015-10-29 DIAGNOSIS — K9429 Other complications of gastrostomy: Secondary | ICD-10-CM | POA: Diagnosis not present

## 2015-10-29 DIAGNOSIS — C098 Malignant neoplasm of overlapping sites of tonsil: Secondary | ICD-10-CM | POA: Diagnosis not present

## 2015-10-29 DIAGNOSIS — M545 Low back pain: Secondary | ICD-10-CM | POA: Diagnosis not present

## 2015-10-30 DIAGNOSIS — C098 Malignant neoplasm of overlapping sites of tonsil: Secondary | ICD-10-CM | POA: Diagnosis not present

## 2015-10-30 DIAGNOSIS — G8929 Other chronic pain: Secondary | ICD-10-CM | POA: Diagnosis not present

## 2015-10-30 DIAGNOSIS — M545 Low back pain: Secondary | ICD-10-CM | POA: Diagnosis not present

## 2015-10-30 DIAGNOSIS — K316 Fistula of stomach and duodenum: Secondary | ICD-10-CM | POA: Diagnosis not present

## 2015-10-30 DIAGNOSIS — K9429 Other complications of gastrostomy: Secondary | ICD-10-CM | POA: Diagnosis not present

## 2015-10-30 DIAGNOSIS — R1312 Dysphagia, oropharyngeal phase: Secondary | ICD-10-CM | POA: Diagnosis not present

## 2015-11-02 DIAGNOSIS — K9429 Other complications of gastrostomy: Secondary | ICD-10-CM | POA: Diagnosis not present

## 2015-11-02 DIAGNOSIS — K316 Fistula of stomach and duodenum: Secondary | ICD-10-CM | POA: Diagnosis not present

## 2015-11-02 DIAGNOSIS — M545 Low back pain: Secondary | ICD-10-CM | POA: Diagnosis not present

## 2015-11-02 DIAGNOSIS — C098 Malignant neoplasm of overlapping sites of tonsil: Secondary | ICD-10-CM | POA: Diagnosis not present

## 2015-11-02 DIAGNOSIS — G8929 Other chronic pain: Secondary | ICD-10-CM | POA: Diagnosis not present

## 2015-11-02 DIAGNOSIS — R1312 Dysphagia, oropharyngeal phase: Secondary | ICD-10-CM | POA: Diagnosis not present

## 2015-11-03 DIAGNOSIS — M545 Low back pain: Secondary | ICD-10-CM | POA: Diagnosis not present

## 2015-11-03 DIAGNOSIS — G8929 Other chronic pain: Secondary | ICD-10-CM | POA: Diagnosis not present

## 2015-11-03 DIAGNOSIS — C098 Malignant neoplasm of overlapping sites of tonsil: Secondary | ICD-10-CM | POA: Diagnosis not present

## 2015-11-03 DIAGNOSIS — K316 Fistula of stomach and duodenum: Secondary | ICD-10-CM | POA: Diagnosis not present

## 2015-11-03 DIAGNOSIS — R1312 Dysphagia, oropharyngeal phase: Secondary | ICD-10-CM | POA: Diagnosis not present

## 2015-11-03 DIAGNOSIS — K9429 Other complications of gastrostomy: Secondary | ICD-10-CM | POA: Diagnosis not present

## 2015-11-04 DIAGNOSIS — R1312 Dysphagia, oropharyngeal phase: Secondary | ICD-10-CM | POA: Diagnosis not present

## 2015-11-04 DIAGNOSIS — K9429 Other complications of gastrostomy: Secondary | ICD-10-CM | POA: Diagnosis not present

## 2015-11-04 DIAGNOSIS — K316 Fistula of stomach and duodenum: Secondary | ICD-10-CM | POA: Diagnosis not present

## 2015-11-04 DIAGNOSIS — C098 Malignant neoplasm of overlapping sites of tonsil: Secondary | ICD-10-CM | POA: Diagnosis not present

## 2015-11-04 DIAGNOSIS — M545 Low back pain: Secondary | ICD-10-CM | POA: Diagnosis not present

## 2015-11-04 DIAGNOSIS — G8929 Other chronic pain: Secondary | ICD-10-CM | POA: Diagnosis not present

## 2015-11-05 DIAGNOSIS — K9429 Other complications of gastrostomy: Secondary | ICD-10-CM | POA: Diagnosis not present

## 2015-11-05 DIAGNOSIS — M545 Low back pain: Secondary | ICD-10-CM | POA: Diagnosis not present

## 2015-11-05 DIAGNOSIS — G8929 Other chronic pain: Secondary | ICD-10-CM | POA: Diagnosis not present

## 2015-11-05 DIAGNOSIS — R1312 Dysphagia, oropharyngeal phase: Secondary | ICD-10-CM | POA: Diagnosis not present

## 2015-11-05 DIAGNOSIS — K316 Fistula of stomach and duodenum: Secondary | ICD-10-CM | POA: Diagnosis not present

## 2015-11-05 DIAGNOSIS — C098 Malignant neoplasm of overlapping sites of tonsil: Secondary | ICD-10-CM | POA: Diagnosis not present

## 2015-11-10 DIAGNOSIS — G8929 Other chronic pain: Secondary | ICD-10-CM | POA: Diagnosis not present

## 2015-11-10 DIAGNOSIS — K316 Fistula of stomach and duodenum: Secondary | ICD-10-CM | POA: Diagnosis not present

## 2015-11-10 DIAGNOSIS — R1312 Dysphagia, oropharyngeal phase: Secondary | ICD-10-CM | POA: Diagnosis not present

## 2015-11-10 DIAGNOSIS — K9429 Other complications of gastrostomy: Secondary | ICD-10-CM | POA: Diagnosis not present

## 2015-11-10 DIAGNOSIS — M545 Low back pain: Secondary | ICD-10-CM | POA: Diagnosis not present

## 2015-11-10 DIAGNOSIS — C098 Malignant neoplasm of overlapping sites of tonsil: Secondary | ICD-10-CM | POA: Diagnosis not present

## 2015-11-12 DIAGNOSIS — K9429 Other complications of gastrostomy: Secondary | ICD-10-CM | POA: Diagnosis not present

## 2015-11-12 DIAGNOSIS — R1312 Dysphagia, oropharyngeal phase: Secondary | ICD-10-CM | POA: Diagnosis not present

## 2015-11-12 DIAGNOSIS — K316 Fistula of stomach and duodenum: Secondary | ICD-10-CM | POA: Diagnosis not present

## 2015-11-12 DIAGNOSIS — C098 Malignant neoplasm of overlapping sites of tonsil: Secondary | ICD-10-CM | POA: Diagnosis not present

## 2015-11-12 DIAGNOSIS — M545 Low back pain: Secondary | ICD-10-CM | POA: Diagnosis not present

## 2015-11-12 DIAGNOSIS — G8929 Other chronic pain: Secondary | ICD-10-CM | POA: Diagnosis not present

## 2015-11-16 DIAGNOSIS — C098 Malignant neoplasm of overlapping sites of tonsil: Secondary | ICD-10-CM | POA: Diagnosis not present

## 2015-11-16 DIAGNOSIS — G8929 Other chronic pain: Secondary | ICD-10-CM | POA: Diagnosis not present

## 2015-11-16 DIAGNOSIS — R1312 Dysphagia, oropharyngeal phase: Secondary | ICD-10-CM | POA: Diagnosis not present

## 2015-11-16 DIAGNOSIS — M545 Low back pain: Secondary | ICD-10-CM | POA: Diagnosis not present

## 2015-11-16 DIAGNOSIS — K316 Fistula of stomach and duodenum: Secondary | ICD-10-CM | POA: Diagnosis not present

## 2015-11-16 DIAGNOSIS — K9429 Other complications of gastrostomy: Secondary | ICD-10-CM | POA: Diagnosis not present

## 2015-11-19 DIAGNOSIS — G8929 Other chronic pain: Secondary | ICD-10-CM | POA: Diagnosis not present

## 2015-11-19 DIAGNOSIS — C098 Malignant neoplasm of overlapping sites of tonsil: Secondary | ICD-10-CM | POA: Diagnosis not present

## 2015-11-19 DIAGNOSIS — M545 Low back pain: Secondary | ICD-10-CM | POA: Diagnosis not present

## 2015-11-19 DIAGNOSIS — R1312 Dysphagia, oropharyngeal phase: Secondary | ICD-10-CM | POA: Diagnosis not present

## 2015-11-19 DIAGNOSIS — K9429 Other complications of gastrostomy: Secondary | ICD-10-CM | POA: Diagnosis not present

## 2015-11-19 DIAGNOSIS — K316 Fistula of stomach and duodenum: Secondary | ICD-10-CM | POA: Diagnosis not present

## 2015-11-20 DIAGNOSIS — K316 Fistula of stomach and duodenum: Secondary | ICD-10-CM | POA: Diagnosis not present

## 2015-11-20 DIAGNOSIS — M545 Low back pain: Secondary | ICD-10-CM | POA: Diagnosis not present

## 2015-11-20 DIAGNOSIS — G8929 Other chronic pain: Secondary | ICD-10-CM | POA: Diagnosis not present

## 2015-11-20 DIAGNOSIS — K9429 Other complications of gastrostomy: Secondary | ICD-10-CM | POA: Diagnosis not present

## 2015-11-20 DIAGNOSIS — R1312 Dysphagia, oropharyngeal phase: Secondary | ICD-10-CM | POA: Diagnosis not present

## 2015-11-20 DIAGNOSIS — C098 Malignant neoplasm of overlapping sites of tonsil: Secondary | ICD-10-CM | POA: Diagnosis not present

## 2015-11-22 DIAGNOSIS — G8929 Other chronic pain: Secondary | ICD-10-CM | POA: Diagnosis not present

## 2015-11-22 DIAGNOSIS — R1312 Dysphagia, oropharyngeal phase: Secondary | ICD-10-CM | POA: Diagnosis not present

## 2015-11-22 DIAGNOSIS — K316 Fistula of stomach and duodenum: Secondary | ICD-10-CM | POA: Diagnosis not present

## 2015-11-22 DIAGNOSIS — M545 Low back pain: Secondary | ICD-10-CM | POA: Diagnosis not present

## 2015-11-22 DIAGNOSIS — K9429 Other complications of gastrostomy: Secondary | ICD-10-CM | POA: Diagnosis not present

## 2015-11-22 DIAGNOSIS — C098 Malignant neoplasm of overlapping sites of tonsil: Secondary | ICD-10-CM | POA: Diagnosis not present

## 2015-11-23 DIAGNOSIS — K9429 Other complications of gastrostomy: Secondary | ICD-10-CM | POA: Diagnosis not present

## 2015-11-23 DIAGNOSIS — M545 Low back pain: Secondary | ICD-10-CM | POA: Diagnosis not present

## 2015-11-23 DIAGNOSIS — K316 Fistula of stomach and duodenum: Secondary | ICD-10-CM | POA: Diagnosis not present

## 2015-11-23 DIAGNOSIS — C098 Malignant neoplasm of overlapping sites of tonsil: Secondary | ICD-10-CM | POA: Diagnosis not present

## 2015-11-23 DIAGNOSIS — R1312 Dysphagia, oropharyngeal phase: Secondary | ICD-10-CM | POA: Diagnosis not present

## 2015-11-23 DIAGNOSIS — G8929 Other chronic pain: Secondary | ICD-10-CM | POA: Diagnosis not present

## 2015-11-24 DIAGNOSIS — C098 Malignant neoplasm of overlapping sites of tonsil: Secondary | ICD-10-CM | POA: Diagnosis not present

## 2015-11-24 DIAGNOSIS — M545 Low back pain: Secondary | ICD-10-CM | POA: Diagnosis not present

## 2015-11-24 DIAGNOSIS — K9429 Other complications of gastrostomy: Secondary | ICD-10-CM | POA: Diagnosis not present

## 2015-11-24 DIAGNOSIS — R1312 Dysphagia, oropharyngeal phase: Secondary | ICD-10-CM | POA: Diagnosis not present

## 2015-11-24 DIAGNOSIS — G8929 Other chronic pain: Secondary | ICD-10-CM | POA: Diagnosis not present

## 2015-11-24 DIAGNOSIS — K316 Fistula of stomach and duodenum: Secondary | ICD-10-CM | POA: Diagnosis not present

## 2015-11-26 DIAGNOSIS — C098 Malignant neoplasm of overlapping sites of tonsil: Secondary | ICD-10-CM | POA: Diagnosis not present

## 2015-11-26 DIAGNOSIS — K9429 Other complications of gastrostomy: Secondary | ICD-10-CM | POA: Diagnosis not present

## 2015-11-26 DIAGNOSIS — G8929 Other chronic pain: Secondary | ICD-10-CM | POA: Diagnosis not present

## 2015-11-26 DIAGNOSIS — R1312 Dysphagia, oropharyngeal phase: Secondary | ICD-10-CM | POA: Diagnosis not present

## 2015-11-26 DIAGNOSIS — M545 Low back pain: Secondary | ICD-10-CM | POA: Diagnosis not present

## 2015-11-26 DIAGNOSIS — K316 Fistula of stomach and duodenum: Secondary | ICD-10-CM | POA: Diagnosis not present

## 2015-11-30 ENCOUNTER — Encounter: Payer: Medicare Other | Admitting: Registered Nurse

## 2015-11-30 DIAGNOSIS — K316 Fistula of stomach and duodenum: Secondary | ICD-10-CM | POA: Diagnosis not present

## 2015-11-30 DIAGNOSIS — G8929 Other chronic pain: Secondary | ICD-10-CM | POA: Diagnosis not present

## 2015-11-30 DIAGNOSIS — R1312 Dysphagia, oropharyngeal phase: Secondary | ICD-10-CM | POA: Diagnosis not present

## 2015-11-30 DIAGNOSIS — C098 Malignant neoplasm of overlapping sites of tonsil: Secondary | ICD-10-CM | POA: Diagnosis not present

## 2015-11-30 DIAGNOSIS — K9429 Other complications of gastrostomy: Secondary | ICD-10-CM | POA: Diagnosis not present

## 2015-11-30 DIAGNOSIS — M545 Low back pain: Secondary | ICD-10-CM | POA: Diagnosis not present

## 2015-12-01 DIAGNOSIS — G8929 Other chronic pain: Secondary | ICD-10-CM | POA: Diagnosis not present

## 2015-12-01 DIAGNOSIS — C098 Malignant neoplasm of overlapping sites of tonsil: Secondary | ICD-10-CM | POA: Diagnosis not present

## 2015-12-01 DIAGNOSIS — M545 Low back pain: Secondary | ICD-10-CM | POA: Diagnosis not present

## 2015-12-01 DIAGNOSIS — K316 Fistula of stomach and duodenum: Secondary | ICD-10-CM | POA: Diagnosis not present

## 2015-12-01 DIAGNOSIS — R1312 Dysphagia, oropharyngeal phase: Secondary | ICD-10-CM | POA: Diagnosis not present

## 2015-12-01 DIAGNOSIS — K9429 Other complications of gastrostomy: Secondary | ICD-10-CM | POA: Diagnosis not present

## 2015-12-02 ENCOUNTER — Encounter (INDEPENDENT_AMBULATORY_CARE_PROVIDER_SITE_OTHER): Payer: Self-pay

## 2015-12-02 ENCOUNTER — Encounter: Payer: Medicare Other | Attending: Physical Medicine and Rehabilitation | Admitting: Registered Nurse

## 2015-12-02 ENCOUNTER — Encounter: Payer: Self-pay | Admitting: Registered Nurse

## 2015-12-02 VITALS — BP 121/76 | HR 80

## 2015-12-02 DIAGNOSIS — Z9889 Other specified postprocedural states: Secondary | ICD-10-CM | POA: Diagnosis not present

## 2015-12-02 DIAGNOSIS — Z5181 Encounter for therapeutic drug level monitoring: Secondary | ICD-10-CM | POA: Insufficient documentation

## 2015-12-02 DIAGNOSIS — G894 Chronic pain syndrome: Secondary | ICD-10-CM | POA: Diagnosis not present

## 2015-12-02 DIAGNOSIS — M5416 Radiculopathy, lumbar region: Secondary | ICD-10-CM | POA: Insufficient documentation

## 2015-12-02 DIAGNOSIS — M542 Cervicalgia: Secondary | ICD-10-CM | POA: Diagnosis not present

## 2015-12-02 DIAGNOSIS — Z79899 Other long term (current) drug therapy: Secondary | ICD-10-CM | POA: Insufficient documentation

## 2015-12-02 DIAGNOSIS — M961 Postlaminectomy syndrome, not elsewhere classified: Secondary | ICD-10-CM | POA: Insufficient documentation

## 2015-12-02 MED ORDER — OXYCODONE-ACETAMINOPHEN 7.5-325 MG PO TABS: 1.0000 | ORAL_TABLET | ORAL | 0 refills | Status: DC | PRN

## 2015-12-02 MED ORDER — OXYMORPHONE HCL ER 20 MG PO TB12: 20.0000 mg | ORAL_TABLET | Freq: Two times a day (BID) | ORAL | 0 refills | Status: DC

## 2015-12-02 NOTE — Progress Notes (Signed)
Subjective:    Patient ID: Bruce Olson, male    DOB: Jul 30, 1955, 60 y.o.   MRN: [id number]  HPI: [name] is a 60 year old male who returns for follow up for chronic pain and medication refill. He states his pain is located in his upper and  lower back. He rates his pain 8. His current exercise regime is receiving home physical therapy twice a week and walking.  Pain Inventory Average Pain 8 Pain Right Now 8 My pain is sharp, stabbing and tingling  In the last 24 hours, has pain interfered with the following? General activity 8 Relation with others 8 Enjoyment of life 8 What TIME of day is your pain at its worst? All Sleep (in general) Poor  Pain is worse with: walking, sitting and standing Pain improves with: medication and TENS Relief from Meds: Little  Mobility walk without assistance use a cane use a walker how many minutes can you walk? 1/4 mile ability to climb steps?  yes do you drive?  yes  Function disabled: date disabled 05/2010  Neuro/Psych weakness numbness tremor trouble walking  Prior Studies Any changes since last visit?  no x-rays CT/MRI  Physicians involved in your care Any changes since last visit?  no Neurologist Sherley Bounds MD Neurosurgeon Sherley Bounds MD   Family History  Problem Relation Age of Onset  . Anesthesia problems Neg Hx   . Hypotension Neg Hx   . Malignant hyperthermia Neg Hx   . Pseudochol deficiency Neg Hx    Social History   Social History  . Marital status: Single    Spouse name: N/A  . Number of children: N/A  . Years of education: N/A   Social History Main Topics  . Smoking status: Current Every Day Smoker    Packs/day: 1.50    Years: 33.00    Types: Cigarettes  . Smokeless tobacco: Never Used  . Alcohol use Yes     Comment: beer , q couple of months  . Drug use: No  . Sexual activity: No     Comment: back problems   Other Topics Concern  . Not on file   Social History Narrative   . No narrative on file   Past Surgical History:  Procedure Laterality Date  . BACK SURGERY  2013   Back surgery  (fusion)09/15/2010 & 2013  . BACK SURGERY  03-2013  . HARDWARE REMOVAL N/A 03/14/2013   Procedure: HARDWARE REMOVAL;  Surgeon: Eustace Moore, MD;  Location: [place] [facility];  Service: Neurosurgery;  Laterality: N/A;   Past Medical History:  Diagnosis Date  . No pertinent past medical history   . Occupational injury 2013   resulted in back injury that led to surgery   There were no vitals taken for this visit.  Opioid Risk Score:   Fall Risk Score:  `1  Depression screen PHQ 2/9  Depression screen [facility] 2/9 08/26/2015 09/22/2014 08/20/2014 04/21/2014  Decreased Interest 0 0 2 2  Down, Depressed, Hopeless 0 0 0 0  PHQ - 2 Score 0 0 2 2  Altered sleeping - - - 3  Tired, decreased energy - - - 3  Change in appetite - - - 0  Feeling bad or failure about yourself  - - - 0  Trouble concentrating - - - 2  Moving slowly or fidgety/restless - - - 0  Suicidal thoughts - - - 0  PHQ-9 Score - - - 10  Review of Systems  Musculoskeletal: Positive for gait problem.  Neurological: Positive for tremors, weakness and numbness.  All other systems reviewed and are negative.      Objective:   Physical Exam  Constitutional: He is oriented to person, place, and time. He appears well-developed and well-nourished.  HENT:  Head: Normocephalic and atraumatic.  Neck: Normal range of motion. Neck supple.  Cardiovascular: Normal rate and regular rhythm.   Pulmonary/Chest: Effort normal and breath sounds normal.  Musculoskeletal:  Normal Muscle Bulk and Muscle Testing Reveals: Upper Extremities: Full ROM and Muscle Strength 5/5 Thoracic Hypersensitivity: T-7- T-12 Lumbar Hypersensitivity Lower Extremities: Full ROM and Muscle Strength 5/5 Arises from Table slowly Narrow Based Gait  Neurological: He is alert and oriented to person, place, and time.  Skin: Skin is warm and dry.    Psychiatric: He has a normal mood and affect.  Nursing note and vitals reviewed.         Assessment & Plan:  1. Lumbar postlaminectomy syndrome status post lumbar fusion x3: His most recent surgery was 03/14/2013. He had hardware removal and bone allograft waist and L2-3.  Refilled: oxyCODONE 7.5/325mg  one tablet every 4 hours as needed #100 and OPANA 20 mg one tablet every 12 hours #60.  We will continue the opioid monitoring program, this consists of regular clinic visits, examinations, urine drug screen, pill counts as well as use of [place] Controlled Substance Reporting System. 2. Lumbar Radiculopathy: Continue Lyrica  25 minutes of face to face patient care time was spent during this visit. All questions were encouraged and answered.   F/U in 1 month

## 2015-12-03 DIAGNOSIS — K9429 Other complications of gastrostomy: Secondary | ICD-10-CM | POA: Diagnosis not present

## 2015-12-03 DIAGNOSIS — K316 Fistula of stomach and duodenum: Secondary | ICD-10-CM | POA: Diagnosis not present

## 2015-12-03 DIAGNOSIS — C098 Malignant neoplasm of overlapping sites of tonsil: Secondary | ICD-10-CM | POA: Diagnosis not present

## 2015-12-03 DIAGNOSIS — G8929 Other chronic pain: Secondary | ICD-10-CM | POA: Diagnosis not present

## 2015-12-03 DIAGNOSIS — R1312 Dysphagia, oropharyngeal phase: Secondary | ICD-10-CM | POA: Diagnosis not present

## 2015-12-03 DIAGNOSIS — M545 Low back pain: Secondary | ICD-10-CM | POA: Diagnosis not present

## 2015-12-08 DIAGNOSIS — K316 Fistula of stomach and duodenum: Secondary | ICD-10-CM | POA: Diagnosis not present

## 2015-12-08 DIAGNOSIS — R1312 Dysphagia, oropharyngeal phase: Secondary | ICD-10-CM | POA: Diagnosis not present

## 2015-12-08 DIAGNOSIS — M545 Low back pain: Secondary | ICD-10-CM | POA: Diagnosis not present

## 2015-12-08 DIAGNOSIS — K9429 Other complications of gastrostomy: Secondary | ICD-10-CM | POA: Diagnosis not present

## 2015-12-08 DIAGNOSIS — G8929 Other chronic pain: Secondary | ICD-10-CM | POA: Diagnosis not present

## 2015-12-08 DIAGNOSIS — C098 Malignant neoplasm of overlapping sites of tonsil: Secondary | ICD-10-CM | POA: Diagnosis not present

## 2015-12-09 LAB — TOXASSURE SELECT,+ANTIDEPR,UR

## 2015-12-10 DIAGNOSIS — K9429 Other complications of gastrostomy: Secondary | ICD-10-CM | POA: Diagnosis not present

## 2015-12-10 DIAGNOSIS — M545 Low back pain: Secondary | ICD-10-CM | POA: Diagnosis not present

## 2015-12-10 DIAGNOSIS — C098 Malignant neoplasm of overlapping sites of tonsil: Secondary | ICD-10-CM | POA: Diagnosis not present

## 2015-12-10 DIAGNOSIS — G8929 Other chronic pain: Secondary | ICD-10-CM | POA: Diagnosis not present

## 2015-12-10 DIAGNOSIS — R1312 Dysphagia, oropharyngeal phase: Secondary | ICD-10-CM | POA: Diagnosis not present

## 2015-12-10 DIAGNOSIS — K316 Fistula of stomach and duodenum: Secondary | ICD-10-CM | POA: Diagnosis not present

## 2015-12-10 NOTE — Progress Notes (Signed)
Urine drug screen for this encounter is consistent for prescribed medication 

## 2015-12-14 DIAGNOSIS — M545 Low back pain: Secondary | ICD-10-CM | POA: Diagnosis not present

## 2015-12-14 DIAGNOSIS — C098 Malignant neoplasm of overlapping sites of tonsil: Secondary | ICD-10-CM | POA: Diagnosis not present

## 2015-12-14 DIAGNOSIS — R1312 Dysphagia, oropharyngeal phase: Secondary | ICD-10-CM | POA: Diagnosis not present

## 2015-12-14 DIAGNOSIS — G8929 Other chronic pain: Secondary | ICD-10-CM | POA: Diagnosis not present

## 2015-12-14 DIAGNOSIS — K316 Fistula of stomach and duodenum: Secondary | ICD-10-CM | POA: Diagnosis not present

## 2015-12-14 DIAGNOSIS — K9429 Other complications of gastrostomy: Secondary | ICD-10-CM | POA: Diagnosis not present

## 2015-12-29 DIAGNOSIS — D696 Thrombocytopenia, unspecified: Secondary | ICD-10-CM | POA: Diagnosis not present

## 2015-12-29 DIAGNOSIS — Z79899 Other long term (current) drug therapy: Secondary | ICD-10-CM | POA: Diagnosis not present

## 2015-12-29 DIAGNOSIS — N179 Acute kidney failure, unspecified: Secondary | ICD-10-CM | POA: Diagnosis not present

## 2015-12-29 DIAGNOSIS — M1990 Unspecified osteoarthritis, unspecified site: Secondary | ICD-10-CM | POA: Diagnosis not present

## 2015-12-29 DIAGNOSIS — G8929 Other chronic pain: Secondary | ICD-10-CM | POA: Diagnosis not present

## 2015-12-29 DIAGNOSIS — F17200 Nicotine dependence, unspecified, uncomplicated: Secondary | ICD-10-CM | POA: Diagnosis not present

## 2015-12-29 DIAGNOSIS — K259 Gastric ulcer, unspecified as acute or chronic, without hemorrhage or perforation: Secondary | ICD-10-CM | POA: Diagnosis not present

## 2015-12-29 DIAGNOSIS — G4733 Obstructive sleep apnea (adult) (pediatric): Secondary | ICD-10-CM | POA: Diagnosis not present

## 2015-12-29 DIAGNOSIS — M545 Low back pain: Secondary | ICD-10-CM | POA: Diagnosis not present

## 2015-12-29 DIAGNOSIS — Z791 Long term (current) use of non-steroidal anti-inflammatories (NSAID): Secondary | ICD-10-CM | POA: Diagnosis not present

## 2015-12-31 ENCOUNTER — Ambulatory Visit: Payer: Self-pay | Admitting: Physical Medicine & Rehabilitation

## 2016-01-07 ENCOUNTER — Encounter: Payer: Medicare Other | Attending: Physical Medicine and Rehabilitation

## 2016-01-07 ENCOUNTER — Ambulatory Visit (HOSPITAL_BASED_OUTPATIENT_CLINIC_OR_DEPARTMENT_OTHER): Payer: Medicare Other | Admitting: Physical Medicine & Rehabilitation

## 2016-01-07 ENCOUNTER — Encounter: Payer: Self-pay | Admitting: Physical Medicine & Rehabilitation

## 2016-01-07 VITALS — BP 143/74 | HR 70 | Resp 14

## 2016-01-07 DIAGNOSIS — Z79899 Other long term (current) drug therapy: Secondary | ICD-10-CM | POA: Insufficient documentation

## 2016-01-07 DIAGNOSIS — M961 Postlaminectomy syndrome, not elsewhere classified: Secondary | ICD-10-CM

## 2016-01-07 DIAGNOSIS — Z5181 Encounter for therapeutic drug level monitoring: Secondary | ICD-10-CM | POA: Insufficient documentation

## 2016-01-07 DIAGNOSIS — M542 Cervicalgia: Secondary | ICD-10-CM | POA: Insufficient documentation

## 2016-01-07 DIAGNOSIS — Z9889 Other specified postprocedural states: Secondary | ICD-10-CM | POA: Diagnosis not present

## 2016-01-07 DIAGNOSIS — M5416 Radiculopathy, lumbar region: Secondary | ICD-10-CM | POA: Diagnosis not present

## 2016-01-07 MED ORDER — OXYCODONE-ACETAMINOPHEN 7.5-325 MG PO TABS: 1.0000 | ORAL_TABLET | ORAL | 0 refills | Status: DC | PRN

## 2016-01-07 MED ORDER — OXYCODONE HCL ER 30 MG PO T12A: 30.0000 mg | EXTENDED_RELEASE_TABLET | Freq: Two times a day (BID) | ORAL | 0 refills | Status: DC

## 2016-01-07 NOTE — Progress Notes (Signed)
Subjective:    Patient ID: Bruce Olson, male    DOB: [birth year], 60 y.o.   MRN: [id number]  HPI 60 year old male with history of lumbar postlaminectomy syndrome who has interval medical history significant for squamous cell carcinoma of the left tonsil. He has been seen by ear, nose and throat surgeon. He was evaluated by medical oncology and radiation oncology, underwent chemotherapy as well as radiation therapy. Had a G-tube. Had gastric ulcers requiring endoscopic intervention. Now is back to eating by mouth, as well as drinking nutritional supplements.  Right-sided neck tingling, states that he underwent radiation to the neck area. He also had a central line in his right jugular.  Right shoulder also feels numb  Pain Inventory Average Pain 8 Pain Right Now 8 My pain is sharp, stabbing and tingling  In the last 24 hours, has pain interfered with the following? General activity 8 Relation with others 8 Enjoyment of life 8 What TIME of day is your pain at its worst? all Sleep (in general) Poor  Pain is worse with: walking, sitting and standing Pain improves with: medication and TENS Relief from Meds: 2  Mobility walk with assistance use a cane how many minutes can you walk? 15 ability to climb steps?  yes do you drive?  yes  Function disabled: date disabled .  Neuro/Psych weakness numbness tingling trouble walking spasms dizziness  Prior Studies Any changes since last visit?  no  Physicians involved in your care Any changes since last visit?  no   Family History  Problem Relation Age of Onset  . Anesthesia problems Neg Hx   . Hypotension Neg Hx   . Malignant hyperthermia Neg Hx   . Pseudochol deficiency Neg Hx    Social History   Social History  . Marital status: Single    Spouse name: N/A  . Number of children: N/A  . Years of education: N/A   Social History Main Topics  . Smoking status: Current Every Day Smoker    Packs/day: 1.00   Years: 33.00    Types: Cigarettes  . Smokeless tobacco: Never Used  . Alcohol use No  . Drug use: No  . Sexual activity: No     Comment: back problems   Other Topics Concern  . None   Social History Narrative  . None   Past Surgical History:  Procedure Laterality Date  . BACK SURGERY  2013   Back surgery  (fusion)09/15/2010 & 2013  . BACK SURGERY  03-2013  . HARDWARE REMOVAL N/A 03/14/2013   Procedure: HARDWARE REMOVAL;  Surgeon: Eustace Moore, MD;  Location: [place] [facility];  Service: Neurosurgery;  Laterality: N/A;   Past Medical History:  Diagnosis Date  . No pertinent past medical history   . Occupational injury 2013   resulted in back injury that led to surgery  . Thyroid cancer (HCC)    BP (!) 143/74 (BP Location: Left Arm, Patient Position: Sitting, Cuff Size: Normal)   Pulse 70   Resp 14   SpO2 97%   Opioid Risk Score:   Fall Risk Score:  `1  Depression screen PHQ 2/9  Depression screen [facility] 2/9 08/26/2015 09/22/2014 08/20/2014 04/21/2014  Decreased Interest 0 0 2 2  Down, Depressed, Hopeless 0 0 0 0  PHQ - 2 Score 0 0 2 2  Altered sleeping - - - 3  Tired, decreased energy - - - 3  Change in appetite - - - 0  Feeling bad or failure  about yourself  - - - 0  Trouble concentrating - - - 2  Moving slowly or fidgety/restless - - - 0  Suicidal thoughts - - - 0  PHQ-9 Score - - - 10    Review of Systems  HENT: Negative.   Eyes: Negative.   Respiratory: Negative.   Cardiovascular: Negative.   Gastrointestinal: Negative.   Endocrine: Negative.   Genitourinary: Negative.   Musculoskeletal: Positive for back pain, gait problem and myalgias.  Skin: Negative.   Allergic/Immunologic: Negative.   Neurological: Positive for dizziness, tremors, weakness and numbness.       Tingling  Hematological: Negative.   Psychiatric/Behavioral: Negative.   All other systems reviewed and are negative.      Objective:   Physical Exam  Constitutional: He is oriented to person,  place, and time. He appears well-developed and well-nourished. No distress.  HENT:  Head: Normocephalic and atraumatic.  Eyes: Conjunctivae and EOM are normal. Pupils are equal, round, and reactive to light.  Neck:  Decreased cervical range of motion  Neurological: He is alert and oriented to person, place, and time. He exhibits normal muscle tone. Coordination normal.  Skin: He is not diaphoretic.  Psychiatric: He has a normal mood and affect.  Nursing note and vitals reviewed.    Decreased sensation in the right C5, C 6-C7, C8 distribution. Motor strength is 4 plus bilateral deltoid, biceps, triceps, grip Cervical range of motion has full flexion, 50% extension, 50%, lateral bending and rotation.  Negative Spurling's maneuver on right side       Assessment & Plan:  1. Cervical pain with right upper extremity numbness. He does have her distraction in range of motion. Would not be surprised if you have cervical degenerative joint disease as well as degenerative disc disease.  Will check x-rays. This could potentially cause some of the numbness that you're experiencing in the right upper limb, however. Other possibility if imaging studies are normal, may be a radiation plexitis.  2. Lumbar postlaminectomy syndrome with chronic pain. Status post L4-5 and L5-S1 fusion  Given Opana is no longer on the market, will switch to OxyContin 30 mg every 12 hours May need to increase to 40mg  next month if current dose not as effective  Continue Percocet 7.5 milligrams 3-4 times per day

## 2016-01-07 NOTE — Patient Instructions (Signed)
Will switch Opana to OxyContin. We'll continue [place] next month Will order neck x-rays, if these look normal, would consider EMG to look for nerve damage related to radiation

## 2016-02-05 ENCOUNTER — Encounter: Payer: Medicare Other | Attending: Physical Medicine and Rehabilitation | Admitting: Registered Nurse

## 2016-02-05 ENCOUNTER — Encounter: Payer: Self-pay | Admitting: Registered Nurse

## 2016-02-05 VITALS — BP 122/74 | HR 70 | Resp 12

## 2016-02-05 DIAGNOSIS — M542 Cervicalgia: Secondary | ICD-10-CM | POA: Diagnosis not present

## 2016-02-05 DIAGNOSIS — G4700 Insomnia, unspecified: Secondary | ICD-10-CM

## 2016-02-05 DIAGNOSIS — M5416 Radiculopathy, lumbar region: Secondary | ICD-10-CM

## 2016-02-05 DIAGNOSIS — M961 Postlaminectomy syndrome, not elsewhere classified: Secondary | ICD-10-CM | POA: Diagnosis not present

## 2016-02-05 DIAGNOSIS — Z79899 Other long term (current) drug therapy: Secondary | ICD-10-CM | POA: Insufficient documentation

## 2016-02-05 DIAGNOSIS — Z9889 Other specified postprocedural states: Secondary | ICD-10-CM | POA: Diagnosis not present

## 2016-02-05 DIAGNOSIS — Z5181 Encounter for therapeutic drug level monitoring: Secondary | ICD-10-CM | POA: Diagnosis not present

## 2016-02-05 DIAGNOSIS — G894 Chronic pain syndrome: Secondary | ICD-10-CM

## 2016-02-05 MED ORDER — TRAZODONE HCL 50 MG PO TABS: ORAL_TABLET | ORAL | 0 refills | Status: DC

## 2016-02-05 MED ORDER — OXYCODONE HCL ER 30 MG PO T12A: 30.0000 mg | EXTENDED_RELEASE_TABLET | Freq: Two times a day (BID) | ORAL | 0 refills | Status: DC

## 2016-02-05 MED ORDER — OXYCODONE-ACETAMINOPHEN 7.5-325 MG PO TABS: 1.0000 | ORAL_TABLET | ORAL | 0 refills | Status: DC | PRN

## 2016-02-05 NOTE — Progress Notes (Signed)
Subjective:    Patient ID: Bruce Olson, male    DOB: 1956-01-09, 61 y.o.   MRN: [id number]  HPI:  Mr. [name] is a 61 year old male who returns for follow up appointment for chronic pain and medication refill. He states his pain is located in his neck radiating into her right shoulder and  lower back radiating into her right leg.Marland Kitchen He rates his pain 8. His current exercise regime is  Walking.  Mr. Settle will have Cervical X-ray next week he states, they were ordered last month by Dr. Letta Pate. He though he had to wait for a call.    Pain Inventory Average Pain 8 Pain Right Now 8 My pain is sharp, stabbing and tingling  In the last 24 hours, has pain interfered with the following? General activity 8 Relation with others 8 Enjoyment of life 8 What TIME of day is your pain at its worst? All Sleep (in general) Poor  Pain is worse with: walking, sitting and standing Pain improves with: medication and TENS Relief from Meds: 2  Mobility walk without assistance use a cane use a walker how many minutes can you walk? "1/4 mile" ability to climb steps?  yes do you drive?  yes  Function disabled: date disabled 05/2010  Neuro/Psych weakness numbness tremor trouble walking  Prior Studies Any changes since last visit?  no  Physicians involved in your care Any changes since last visit?  no   Family History  Problem Relation Age of Onset  . Anesthesia problems Neg Hx   . Hypotension Neg Hx   . Malignant hyperthermia Neg Hx   . Pseudochol deficiency Neg Hx    Social History   Social History  . Marital status: Single    Spouse name: N/A  . Number of children: N/A  . Years of education: N/A   Social History Main Topics  . Smoking status: Current Every Day Smoker    Packs/day: 1.00    Years: 33.00    Types: Cigarettes  . Smokeless tobacco: Never Used  . Alcohol use No  . Drug use: No  . Sexual activity: No     Comment: back problems    Other Topics Concern  . None   Social History Narrative  . None   Past Surgical History:  Procedure Laterality Date  . BACK SURGERY  2013   Back surgery  (fusion)09/15/2010 & 2013  . BACK SURGERY  03-2013  . HARDWARE REMOVAL N/A 03/14/2013   Procedure: HARDWARE REMOVAL;  Surgeon: Eustace Moore, MD;  Location: [place] [facility];  Service: Neurosurgery;  Laterality: N/A;   Past Medical History:  Diagnosis Date  . No pertinent past medical history   . Occupational injury 2013   resulted in back injury that led to surgery  . Thyroid cancer (HCC)    BP 122/74   Pulse 70   Resp 12   SpO2 97%   Opioid Risk Score:   Fall Risk Score:  `1  Depression screen PHQ 2/9  Depression screen [facility] 2/9 08/26/2015 09/22/2014 08/20/2014 04/21/2014  Decreased Interest 0 0 2 2  Down, Depressed, Hopeless 0 0 0 0  PHQ - 2 Score 0 0 2 2  Altered sleeping - - - 3  Tired, decreased energy - - - 3  Change in appetite - - - 0  Feeling bad or failure about yourself  - - - 0  Trouble concentrating - - - 2  Moving slowly or  fidgety/restless - - - 0  Suicidal thoughts - - - 0  PHQ-9 Score - - - 10     Review of Systems  Constitutional: Negative.   HENT: Negative.   Eyes: Negative.   Respiratory: Negative.   Cardiovascular: Negative.   Gastrointestinal: Negative.   Endocrine: Negative.   Genitourinary: Negative.   Musculoskeletal: Negative.   Skin: Negative.   Allergic/Immunologic: Negative.   Neurological: Negative.   Hematological: Negative.   Psychiatric/Behavioral: Negative.   All other systems reviewed and are negative.      Objective:   Physical Exam  Constitutional: He is oriented to person, place, and time. He appears well-developed and well-nourished.  HENT:  Head: Normocephalic and atraumatic.  Neck: Normal range of motion. Neck supple.  Cardiovascular: Normal rate and regular rhythm.   Pulmonary/Chest: Effort normal and breath sounds normal.  Musculoskeletal:  Normal Muscle  Bulk and Muscle Testing Reveals: Upper Full ROM and Muscle Strength 5/5 Right AC Joint Tenderness Thoracic Paraspinal Tenderness: T-1-T-3 Lumbar Sensitivity Lower Extremities: Full ROM and Muscle Strength 5/5 Arises from Table slowly using straight cane for support Antalgic gait  Neurological: He is alert and oriented to person, place, and time.  Skin: Skin is warm and dry.  Psychiatric: He has a normal mood and affect.  Nursing note and vitals reviewed.         Assessment & Plan:  1. Lumbar postlaminectomy syndrome status post lumbar fusion x3: His most recent surgery was 03/14/2013. He had hardware removal and bone allograft waist and L2-3.  Refilled: oxyCODONE 7.5/325mg  one tablet every 4 hours as needed #100 and Oxycontin 30 mg one tablet every 12 hours #60.  We will continue the opioid monitoring program, this consists of regular clinic visits, examinations, urine drug screen, pill counts as well as use of [place] Controlled Substance Reporting System. 2. Lumbar Radiculopathy: Continue Lyrica 3. Insomnia: RX: Trazodone  25 minutes of face to face patient care time was spent during this visit. All questions were encouraged and answered.   F/U in 1 month

## 2016-02-11 ENCOUNTER — Other Ambulatory Visit: Payer: Self-pay | Admitting: Physical Medicine & Rehabilitation

## 2016-02-11 ENCOUNTER — Ambulatory Visit
Admission: RE | Admit: 2016-02-11 | Discharge: 2016-02-11 | Disposition: A | Discharge status: Home / Self Care | Payer: Medicare Other | Source: Ambulatory Visit | Attending: Physical Medicine & Rehabilitation | Admitting: Physical Medicine & Rehabilitation

## 2016-02-11 DIAGNOSIS — M542 Cervicalgia: Secondary | ICD-10-CM | POA: Diagnosis not present

## 2016-02-15 ENCOUNTER — Telehealth: Payer: Self-pay

## 2016-02-15 NOTE — Telephone Encounter (Signed)
Patient has called requesting his x-ray results.  Please advise

## 2016-02-16 NOTE — Telephone Encounter (Signed)
Arthritis of the neck, we can either prescribe physical therapy or have him come in for neck injections under fluoroscopy, medial branch blocks

## 2016-02-18 ENCOUNTER — Telehealth: Payer: Self-pay | Admitting: Registered Nurse

## 2016-02-18 NOTE — Telephone Encounter (Signed)
On January 18,2018 NCCSR was reviewed: No conflict was seen on the [place] with Multiple Prescribers. Mr. Segall has a signed  Narcotic Contract with our office. If there were any discrepancies this would have been reported to his Physcian.

## 2016-02-19 NOTE — Telephone Encounter (Signed)
Information given to Bruce Olson.

## 2016-03-04 ENCOUNTER — Encounter: Payer: Self-pay | Admitting: Physical Medicine & Rehabilitation

## 2016-03-04 ENCOUNTER — Encounter: Payer: Medicare Other | Attending: Physical Medicine and Rehabilitation

## 2016-03-04 ENCOUNTER — Ambulatory Visit (HOSPITAL_BASED_OUTPATIENT_CLINIC_OR_DEPARTMENT_OTHER): Payer: Medicare Other | Admitting: Physical Medicine & Rehabilitation

## 2016-03-04 VITALS — BP 157/68 | HR 65 | Resp 14 | Wt 108.2 lb

## 2016-03-04 DIAGNOSIS — Z79899 Other long term (current) drug therapy: Secondary | ICD-10-CM | POA: Diagnosis not present

## 2016-03-04 DIAGNOSIS — Z9889 Other specified postprocedural states: Secondary | ICD-10-CM | POA: Insufficient documentation

## 2016-03-04 DIAGNOSIS — M791 Myalgia: Secondary | ICD-10-CM | POA: Diagnosis not present

## 2016-03-04 DIAGNOSIS — M542 Cervicalgia: Secondary | ICD-10-CM

## 2016-03-04 DIAGNOSIS — M5416 Radiculopathy, lumbar region: Secondary | ICD-10-CM | POA: Insufficient documentation

## 2016-03-04 DIAGNOSIS — Z8589 Personal history of malignant neoplasm of other organs and systems: Secondary | ICD-10-CM | POA: Diagnosis not present

## 2016-03-04 DIAGNOSIS — Z5181 Encounter for therapeutic drug level monitoring: Secondary | ICD-10-CM | POA: Diagnosis not present

## 2016-03-04 DIAGNOSIS — M961 Postlaminectomy syndrome, not elsewhere classified: Secondary | ICD-10-CM

## 2016-03-04 DIAGNOSIS — M7918 Myalgia, other site: Secondary | ICD-10-CM

## 2016-03-04 MED ORDER — OXYCODONE HCL ER 30 MG PO T12A: 30.0000 mg | EXTENDED_RELEASE_TABLET | Freq: Two times a day (BID) | ORAL | 0 refills | Status: DC

## 2016-03-04 MED ORDER — PREGABALIN 100 MG PO CAPS: 100.0000 mg | ORAL_CAPSULE | Freq: Three times a day (TID) | ORAL | 5 refills | Status: DC

## 2016-03-04 MED ORDER — OXYCODONE-ACETAMINOPHEN 7.5-325 MG PO TABS: 1.0000 | ORAL_TABLET | ORAL | 0 refills | Status: DC | PRN

## 2016-03-04 MED ORDER — TRAZODONE HCL 50 MG PO TABS: ORAL_TABLET | ORAL | 0 refills | Status: DC

## 2016-03-04 NOTE — Progress Notes (Addendum)
Subjective:    Patient ID: Bruce Olson, male    DOB: [birth year], 61 y.o.   MRN: [id number]  HPI Returns with chief complaint neck pain. Neck hurts when leaning his head toward the right side. No Pains down the arm or into the hand area. No numbness or tingling in the arms or hands. The patient states that this all started after he had been in the ICU following treatment for No Falls or trauma recently.  Review x-rays which demonstrated C5-C6 degenerative joint disease, disc space narrowing. In addition, C4, C5, showed similar but less severe findings. At C3-C4. There was anterior subluxation of 3-4 mm and some mild degenerative changes. Evidence of moderate degenerative changes at C6-C7. C2-3 and C7-T1 look normal  Patient feels like his neck pain occurred when he had complications related to the treatment of his tonsillar carcinoma. 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 Nicolette Bang Medical Oncologist: Dr. Melven Sartorius Radiation Oncologist: Dr. Tharon Aquas   Squamous cell carcinoma of tonsil [facility])  06/2015 Initial Diagnosis  Squamous cell carcinoma of tonsil, left   06/24/2015 Biopsy  Tonsil: invasive squamous cell carcinoma, moderately differentiated, HPV16/HPV18+   08/17/2015 - Radiation Therapy  70 Gy in 35 fractions to the bilateral neck with concurrent chemotherapy  He wishes to get a referral to an oncologist through the [facility] system. His PCP is through [facility] and the patient doesn't think he can get an out of system referral   Pain Inventory Average Pain 8 Pain Right Now 8 My pain is sharp, stabbing and tingling  In the last 24 hours, has pain interfered with the following? General activity 8 Relation with others 8 Enjoyment of life 8 What TIME of day is your pain at its worst? all Sleep (in general) Poor  Pain is  worse with: walking, sitting and standing Pain improves with: medication and TENS Relief from Meds: 2  Mobility walk without assistance use a cane how many minutes can you walk? 1/43mi ability to climb steps?  yes do you drive?  yes  Function disabled: date disabled 2012  Neuro/Psych weakness numbness tremor trouble walking  Prior Studies Any changes since last visit?  no  Physicians involved in your care Any changes since last visit?  no   Family History  Problem Relation Age of Onset  . Anesthesia problems Neg Hx   . Hypotension Neg Hx   . Malignant hyperthermia Neg Hx   . Pseudochol deficiency Neg Hx    Social History   Social History  . Marital status: Single    Spouse name: N/A  . Number of children: N/A  . Years of education: N/A   Social History Main Topics  . Smoking status: Current Every Day Smoker    Packs/day: 1.00    Years: 33.00    Types: Cigarettes  . Smokeless tobacco: Never Used  . Alcohol use No  . Drug use: No  . Sexual activity: No     Comment: back problems   Other Topics Concern  . None   Social History Narrative  . None   Past Surgical History:  Procedure Laterality Date  . BACK SURGERY  2013   Back surgery  (fusion)09/15/2010 & 2013  . BACK SURGERY  03-2013  . HARDWARE REMOVAL N/A 03/14/2013   Procedure: HARDWARE REMOVAL;  Surgeon: Eustace Moore, MD;  Location:  [place] [facility];  Service: Neurosurgery;  Laterality: N/A;   Past Medical History:  Diagnosis Date  . No pertinent past medical history   . Occupational injury 2013   resulted in back injury that led to surgery  . Thyroid cancer (HCC)    BP (!) 157/68   Pulse 65   Resp 14   Wt 108 lb 3.2 oz (49.1 kg)   SpO2 98%   BMI 17.46 kg/m   Opioid Risk Score:   Fall Risk Score:  `1  Depression screen PHQ 2/9  Depression screen [facility] 2/9 08/26/2015 09/22/2014 08/20/2014 04/21/2014  Decreased Interest 0 0 2 2  Down, Depressed, Hopeless 0 0 0 0  PHQ - 2 Score 0 0 2 2    Altered sleeping - - - 3  Tired, decreased energy - - - 3  Change in appetite - - - 0  Feeling bad or failure about yourself  - - - 0  Trouble concentrating - - - 2  Moving slowly or fidgety/restless - - - 0  Suicidal thoughts - - - 0  PHQ-9 Score - - - 10   Review of Systems  HENT: Negative.   Eyes: Negative.   Respiratory: Negative.   Cardiovascular: Negative.   Gastrointestinal: Negative.   Endocrine: Negative.   Genitourinary: Negative.   Musculoskeletal: Positive for gait problem.  Skin: Negative.   Allergic/Immunologic: Negative.   Neurological: Positive for tremors, weakness and numbness.  Hematological: Negative.   Psychiatric/Behavioral: Negative.   All other systems reviewed and are negative.      Objective:   Physical Exam  Constitutional: He is oriented to person, place, and time. He appears well-developed and well-nourished.  HENT:  Head: Normocephalic and atraumatic.  Eyes: Conjunctivae and EOM are normal. Pupils are equal, round, and reactive to light.  Neck: Normal range of motion.  Neurological: He is alert and oriented to person, place, and time. Gait abnormal.  Psychiatric: His behavior is normal. Judgment and thought content normal. His affect is blunt.  Nursing note and vitals reviewed.  Upper extremity strength minus/5, bilateral deltoid by suppressive grip. Sensation intact. Bilateral C4, C5, C6-C7, C8 dermatomal distribution. Deep tendon reflexes are 3 plus bilateral biceps, triceps, brachial radialis, 3 plus bilateral knees and ankles       Assessment & Plan:   1.  Cervical pain, multifactorial. He does have cervical degenerative disc as well as cervical facet arthropathy. He had exacerbation of his pain related to his hospitalization but no history of any type of tumor involvement in the cervical spine area. X-rays do show degenerative changes. He did have radiation therapy as noted above. At patient's request. Made referral to oncology at  [facility] system for follow-up.  Today, we'll do trigger point injection given he has a myofascial component to his pain with moderate tenderness over the trapezius and levator scapula  Trigger Point Injection  Indication: Right trapezius and right levator scapula Myofascial pain not relieved by medication management and other conservative care.  Informed consent was obtained after describing risk and benefits of the procedure with the patient, this includes bleeding, bruising, infection and medication side effects.  The patient wishes to proceed and has given written consent.  The patient was placed in a seated position.  The Right trap and RIght levator scap area was marked and prepped with Betadine.  It was entered with a 25-gauge 1-1/2 inch needle and 1 mL of 1% lidocaine was injected into each of 2 trigger points,  after negative draw back for blood.  The patient tolerated the procedure well.  Post procedure instructions were given.  Would consider medial branch blocks if above is not helpful. If patient has worsening of pain or any neurologic signs, would order cervical MRI  . 4. 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 He remains on a chronic stable  Moderate to high dose opioid treatment with combination long-acting short acting. Continue OxyContin 30 mg twice a day Continue Percocet 7.5 milligrams 4 times a day Continue Lyrica 100 mg 3 times a day  Continue opioid monitoring program. This consists of regular clinic visits, examinations, urine drug screen, pill counts as well as use of [place] controlled substance reporting System. The [place] control substances reporting system was reviewed. There currently is no evidence of multiple prescribers for opiates and other controlled substances. Patients on chronic opioids will have this reviewed at minimum every 3 months. Follow-up one month

## 2016-03-04 NOTE — Patient Instructions (Signed)
You have evidence of cervical degenerative disc as well as arthritis in the cervical spine. We did do trigger point injections for muscle spasms. If this does not help, and I would recommend nerve blocks for the cervical spine under x-ray guidance

## 2016-03-11 ENCOUNTER — Telehealth: Payer: Self-pay | Admitting: *Deleted

## 2016-03-11 NOTE — Telephone Encounter (Signed)
Mr Bill has not heard from oncologist.  He has called and is asking for the name and # of the office.  Corene Cornea can you help him?

## 2016-03-16 ENCOUNTER — Other Ambulatory Visit: Payer: Self-pay | Admitting: *Deleted

## 2016-03-17 ENCOUNTER — Other Ambulatory Visit: Payer: Self-pay

## 2016-03-17 ENCOUNTER — Ambulatory Visit: Payer: Self-pay | Admitting: Hematology & Oncology

## 2016-03-17 ENCOUNTER — Ambulatory Visit: Payer: Self-pay

## 2016-03-31 ENCOUNTER — Encounter: Payer: Self-pay | Admitting: Registered Nurse

## 2016-03-31 ENCOUNTER — Encounter: Payer: Medicare Other | Attending: Physical Medicine and Rehabilitation | Admitting: Registered Nurse

## 2016-03-31 VITALS — BP 147/74 | HR 70

## 2016-03-31 DIAGNOSIS — Z79899 Other long term (current) drug therapy: Secondary | ICD-10-CM | POA: Diagnosis not present

## 2016-03-31 DIAGNOSIS — M961 Postlaminectomy syndrome, not elsewhere classified: Secondary | ICD-10-CM | POA: Diagnosis not present

## 2016-03-31 DIAGNOSIS — M5416 Radiculopathy, lumbar region: Secondary | ICD-10-CM | POA: Diagnosis not present

## 2016-03-31 DIAGNOSIS — G4700 Insomnia, unspecified: Secondary | ICD-10-CM

## 2016-03-31 DIAGNOSIS — M7918 Myalgia, other site: Secondary | ICD-10-CM

## 2016-03-31 DIAGNOSIS — M791 Myalgia: Secondary | ICD-10-CM

## 2016-03-31 DIAGNOSIS — Z9889 Other specified postprocedural states: Secondary | ICD-10-CM | POA: Insufficient documentation

## 2016-03-31 DIAGNOSIS — M542 Cervicalgia: Secondary | ICD-10-CM

## 2016-03-31 DIAGNOSIS — G894 Chronic pain syndrome: Secondary | ICD-10-CM

## 2016-03-31 DIAGNOSIS — Z5181 Encounter for therapeutic drug level monitoring: Secondary | ICD-10-CM | POA: Diagnosis not present

## 2016-03-31 MED ORDER — ONDANSETRON 8 MG PO TBDP: 8.0000 mg | ORAL_TABLET | Freq: Three times a day (TID) | ORAL | 0 refills | Status: DC | PRN

## 2016-03-31 MED ORDER — TRAZODONE HCL 50 MG PO TABS: ORAL_TABLET | ORAL | 0 refills | Status: DC

## 2016-03-31 MED ORDER — OXYCODONE HCL ER 30 MG PO T12A: 30.0000 mg | EXTENDED_RELEASE_TABLET | Freq: Two times a day (BID) | ORAL | 0 refills | Status: DC

## 2016-03-31 MED ORDER — OXYCODONE-ACETAMINOPHEN 7.5-325 MG PO TABS: 1.0000 | ORAL_TABLET | ORAL | 0 refills | Status: DC | PRN

## 2016-03-31 NOTE — Progress Notes (Signed)
Subjective:    Patient ID: Bruce Olson, male    DOB: [birth year], 60 y.o.   MRN: [id number]  HPI: [name] is a 61 year old male who returns for follow up appointment for chronic pain and medication refill. He states his pain is located in his neck, and mid-lower back radiating into his right lower extremity. He rates his pain 8. His current exercise regime is  walking. S/P Trigger Point Injection with no relief, he's considering the cervical nerve blocks. He will call the office with his final answer he states.   Prescribed [name], he's waiting an appointment with new Oncologist, he verbalizes understanding.   Pain Inventory Average Pain 8 Pain Right Now 8 My pain is .  In the last 24 hours, has pain interfered with the following? General activity 8 Relation with others 8 Enjoyment of life 8 What TIME of day is your pain at its worst? all Sleep (in general) Poor  Pain is worse with: walking, sitting and standing Pain improves with: medication Relief from Meds: 2  Mobility use a cane ability to climb steps?  yes do you drive?  yes  Function disabled: date disabled 2012  Neuro/Psych weakness numbness tremor trouble walking  Prior Studies Any changes since last visit?  no  Physicians involved in your care Any changes since last visit?  no   Family History  Problem Relation Age of Onset  . Anesthesia problems Neg Hx   . Hypotension Neg Hx   . Malignant hyperthermia Neg Hx   . Pseudochol deficiency Neg Hx    Social History   Social History  . Marital status: Single    Spouse name: N/A  . Number of children: N/A  . Years of education: N/A   Social History Main Topics  . Smoking status: Current Every Day Smoker    Packs/day: 1.00    Years: 33.00    Types: Cigarettes  . Smokeless tobacco: Never Used  . Alcohol use No  . Drug use: No  . Sexual activity: No     Comment: back problems   Other Topics Concern  . Not on file     Social History Narrative  . No narrative on file   Past Surgical History:  Procedure Laterality Date  . BACK SURGERY  2013   Back surgery  (fusion)09/15/2010 & 2013  . BACK SURGERY  03-2013  . HARDWARE REMOVAL N/A 03/14/2013   Procedure: HARDWARE REMOVAL;  Surgeon: Eustace Moore, MD;  Location: [place] [facility] ORS;  Service: Neurosurgery;  Laterality: N/A;   Past Medical History:  Diagnosis Date  . No pertinent past medical history   . Occupational injury 2013   resulted in back injury that led to surgery  . Thyroid cancer ([place])    There were no vitals taken for this visit.  Opioid Risk Score:   Fall Risk Score:  `1  Depression screen PHQ 2/9  Depression screen [facility] 2/9 08/26/2015 09/22/2014 08/20/2014 04/21/2014  Decreased Interest 0 0 2 2  Down, Depressed, Hopeless 0 0 0 0  PHQ - 2 Score 0 0 2 2  Altered sleeping - - - 3  Tired, decreased energy - - - 3  Change in appetite - - - 0  Feeling bad or failure about yourself  - - - 0  Trouble concentrating - - - 2  Moving slowly or fidgety/restless - - - 0  Suicidal thoughts - - - 0  PHQ-9 Score - - -  10    Review of Systems  HENT: Negative.   Eyes: Negative.   Respiratory: Negative.   Cardiovascular: Negative.   Gastrointestinal: Negative.   Endocrine: Negative.   Genitourinary: Negative.   Musculoskeletal: Positive for back pain and gait problem.  Skin: Negative.   Allergic/Immunologic: Negative.   Neurological: Positive for tremors, weakness and numbness.  Hematological: Negative.   Psychiatric/Behavioral: Negative.   All other systems reviewed and are negative.      Objective:   Physical Exam  Constitutional: He is oriented to person, place, and time. He appears well-developed and well-nourished.  HENT:  Head: Normocephalic and atraumatic.  Neck: Normal range of motion. Neck supple.  Cervical Paraspinal Tenderness: C-5- C-6  Cardiovascular: Normal rate and regular rhythm.   Pulmonary/Chest: Effort normal and  breath sounds normal.  Musculoskeletal:  Normal Muscle Bulk and Muscle Testing Reveals: Upper Extremities: Full ROM and Muscle Strength 5/5 Right AC Joint Tenderness Thoracic Paraspinal Tenderness: T-1-T-3 Mainly Right Side T-7-T-9 T-11-T-12 Lumbar Hypersensitivity Lower Extremities: Full ROM and Muscle Strength 5/5 Arises from Table with ease Narrow Based Gait   Neurological: He is alert and oriented to person, place, and time.  Skin: Skin is warm and dry.  Psychiatric: He has a normal mood and affect.  Nursing note and vitals reviewed.         Assessment & Plan:  1. Lumbar postlaminectomy syndrome status post lumbar fusion x3: His most recent surgery was 03/14/2013. He had hardware removal and bone allograft waist and L2-3. 03/31/2016 Refilled: oxyCODONE 7.5/325mg  one tablet every 4 hours as needed #100 and Oxycontin 30 mg one tablet every 12 hours #60.  We will continue the opioid monitoring program, this consists of regular clinic visits, examinations, urine drug screen, pill counts as well as use of [place] Controlled Substance Reporting System. 2. Lumbar Radiculopathy: Continue Lyrica. 03/31/2016 3. Insomnia: Continue Trazodone.03/31/2016  20 minutes of face to face patient care time was spent during this visit. All questions were encouraged and answered.  F/U in 1 month

## 2016-04-05 ENCOUNTER — Ambulatory Visit: Payer: Medicare Other

## 2016-04-05 ENCOUNTER — Ambulatory Visit (HOSPITAL_BASED_OUTPATIENT_CLINIC_OR_DEPARTMENT_OTHER): Payer: Medicare Other

## 2016-04-05 DIAGNOSIS — C090 Malignant neoplasm of tonsillar fossa: Secondary | ICD-10-CM | POA: Diagnosis present

## 2016-04-05 DIAGNOSIS — E034 Atrophy of thyroid (acquired): Secondary | ICD-10-CM | POA: Insufficient documentation

## 2016-04-05 LAB — CBC WITH DIFFERENTIAL (CANCER CENTER ONLY)
BASO#: 0 10*3/uL (ref 0.0–0.2)
BASO%: 0.3 % (ref 0.0–2.0)
EOS%: 1.3 % (ref 0.0–7.0)
Eosinophils Absolute: 0 10*3/uL (ref 0.0–0.5)
HEMATOCRIT: 29.1 % — AB (ref 38.7–49.9)
HGB: 10.2 g/dL — ABNORMAL LOW (ref 13.0–17.1)
LYMPH#: 0.8 10*3/uL — AB (ref 0.9–3.3)
LYMPH%: 27.7 % (ref 14.0–48.0)
MCH: 31.9 pg (ref 28.0–33.4)
MCHC: 35.1 g/dL (ref 32.0–35.9)
MCV: 91 fL (ref 82–98)
MONO#: 0.4 10*3/uL (ref 0.1–0.9)
MONO%: 14.2 % — ABNORMAL HIGH (ref 0.0–13.0)
NEUT%: 56.5 % (ref 40.0–80.0)
NEUTROS ABS: 1.7 10*3/uL (ref 1.5–6.5)
Platelets: 183 10*3/uL (ref 145–400)
RBC: 3.2 10*6/uL — ABNORMAL LOW (ref 4.20–5.70)
RDW: 13.2 % (ref 11.1–15.7)
WBC: 3 10*3/uL — ABNORMAL LOW (ref 4.0–10.0)

## 2016-04-05 LAB — COMPREHENSIVE METABOLIC PANEL
ALBUMIN: 4.1 g/dL (ref 3.5–5.0)
ALK PHOS: 67 U/L (ref 40–150)
ALT: 10 U/L (ref 0–55)
AST: 17 U/L (ref 5–34)
Anion Gap: 6 mEq/L (ref 3–11)
BUN: 23.6 mg/dL (ref 7.0–26.0)
CO2: 29 mEq/L (ref 22–29)
Calcium: 9.2 mg/dL (ref 8.4–10.4)
Chloride: 99 mEq/L (ref 98–109)
Creatinine: 1.1 mg/dL (ref 0.7–1.3)
EGFR: 71 mL/min/{1.73_m2} — ABNORMAL LOW (ref >90)
GLUCOSE: 68 mg/dL — AB (ref 70–140)
POTASSIUM: 4.7 meq/L (ref 3.5–5.1)
Sodium: 135 mEq/L — ABNORMAL LOW (ref 136–145)
TOTAL PROTEIN: 7 g/dL (ref 6.4–8.3)
Total Bilirubin: 0.3 mg/dL (ref 0.20–1.20)

## 2016-04-06 ENCOUNTER — Ambulatory Visit (HOSPITAL_BASED_OUTPATIENT_CLINIC_OR_DEPARTMENT_OTHER): Payer: Medicare Other | Admitting: Hematology & Oncology

## 2016-04-06 VITALS — BP 140/77 | HR 75 | Temp 98.2°F | Resp 20 | Ht 66.0 in | Wt 109.1 lb

## 2016-04-06 DIAGNOSIS — Z85818 Personal history of malignant neoplasm of other sites of lip, oral cavity, and pharynx: Secondary | ICD-10-CM | POA: Diagnosis not present

## 2016-04-06 DIAGNOSIS — E890 Postprocedural hypothyroidism: Secondary | ICD-10-CM | POA: Diagnosis not present

## 2016-04-06 DIAGNOSIS — Y842 Radiological procedure and radiotherapy as the cause of abnormal reaction of the patient, or of later complication, without mention of misadventure at the time of the procedure: Secondary | ICD-10-CM

## 2016-04-06 DIAGNOSIS — E034 Atrophy of thyroid (acquired): Secondary | ICD-10-CM

## 2016-04-06 DIAGNOSIS — Z720 Tobacco use: Secondary | ICD-10-CM

## 2016-04-06 DIAGNOSIS — C090 Malignant neoplasm of tonsillar fossa: Secondary | ICD-10-CM

## 2016-04-06 DIAGNOSIS — Z7189 Other specified counseling: Secondary | ICD-10-CM

## 2016-04-06 DIAGNOSIS — K117 Disturbances of salivary secretion: Secondary | ICD-10-CM

## 2016-04-06 LAB — TSH: TSH: 1.573 m[IU]/L (ref 0.320–4.118)

## 2016-04-07 ENCOUNTER — Encounter: Payer: Self-pay | Admitting: Hematology & Oncology

## 2016-04-07 DIAGNOSIS — Y842 Radiological procedure and radiotherapy as the cause of abnormal reaction of the patient, or of later complication, without mention of misadventure at the time of the procedure: Secondary | ICD-10-CM | POA: Insufficient documentation

## 2016-04-07 DIAGNOSIS — Z7189 Other specified counseling: Secondary | ICD-10-CM

## 2016-04-07 DIAGNOSIS — K117 Disturbances of salivary secretion: Secondary | ICD-10-CM | POA: Insufficient documentation

## 2016-04-07 HISTORY — DX: Other specified counseling: Z71.89

## 2016-04-07 MED ORDER — PILOCARPINE HCL 5 MG PO TABS: 5.0000 mg | ORAL_TABLET | Freq: Two times a day (BID) | ORAL | 6 refills | Status: DC

## 2016-04-07 NOTE — Progress Notes (Signed)
Referral MD  Reason for Referral: Stage II (T3N2N0) squamous cell carcinoma of the left tonsil-HPV positive-status post chemotherapy/radiation therapy at [place]  Patient presents with  . Initial Assessment    Second opinion follow-up.  : I need some way to follow-up on my cancer.  HPI: Bruce Olson is a very nice 61 year old white male. He presented back in May of last year with a mass in the left neck. He ultimately ended up at [facility]. A biopsy was done of the left tonsil which proved to be squamous cell carcinoma. It was HPV positive. Workup did not show any metastatic disease. He had a PET scan done. It did show that there was a left lateral retropharyngeal lymph node and to level II cervical lymph nodes at the level of the hyoid. He did have adenopathy in the neck. He was thus staged at stage II HPV associated squamous cell cancer.  He was treated with radiation therapy and chemotherapy. He got weekly cis-platinum. This was completed in August.  He did have problems with a feeding tube. Apparently, there were complications from the feeding tube. He got septic from this. He needed surgery to have this removed.  I'm sure if he's had any type of postop follow-up.  Because he lives in the area, it was easier for him to have follow-up with [place]. As such, he was referred to the [place].  He has other health issues. He has a lot of back and neck problems. He has had surgery.  He has lost quite a bit a weight. He lost about 20 pounds during treatment. In addition, he had some weight issues after having surgery.  He's had no bleeding. He is still smoking. He still smokes about 2 packs per day.  He's had no cough. He's had no change in bowel or bladder habits.  Overall, his performance status is ECOG 1-2.    Past Medical History:  Diagnosis Date  . No pertinent past medical history   . Occupational injury 2013   resulted in  back injury that led to surgery  . Thyroid cancer [facility])   :  Past Surgical History:  Procedure Laterality Date  . BACK SURGERY  2013   Back surgery  (fusion)09/15/2010 & 2013  . BACK SURGERY  03-2013  . HARDWARE REMOVAL N/A 03/14/2013   Procedure: HARDWARE REMOVAL;  Surgeon: Eustace Moore, MD;  Location: [place] NEURO ORS;  Service: Neurosurgery;  Laterality: N/A;  :   Current Outpatient Prescriptions:  .  docusate sodium (COLACE) 100 MG capsule, Take 100 mg by mouth daily., Disp: , Rfl:  .  Multiple Vitamin (MULITIVITAMIN WITH MINERALS) TABS, Take 1 tablet by mouth daily., Disp: , Rfl:  .  ondansetron (ZOFRAN ODT) 8 MG disintegrating tablet, Take 1 tablet (8 mg total) by mouth every 8 (eight) hours as needed for nausea or vomiting., Disp: 30 tablet, Rfl: 0 .  oxyCODONE (OXYCONTIN) 30 MG 12 hr tablet, Take 30 mg by mouth 2 (two) times daily., Disp: 60 each, Rfl: 0 .  oxyCODONE-acetaminophen (PERCOCET) 7.5-325 MG tablet, Take 1 tablet by mouth every 4 (four) hours as needed. May take one extra tablet when pain is sever. No more than 4 a day, Disp: 100 tablet, Rfl: 0 .  pregabalin (LYRICA) 100 MG capsule, Take 1 capsule (100 mg total) by mouth 3 (three) times daily., Disp: 90 capsule, Rfl: 5 .  traZODone (DESYREL) 50 MG tablet, Take 1 tablet at  bedtime as needed, Disp: 30 tablet, Rfl: 0:  :  No Known Allergies:  Family History  Problem Relation Age of Onset  . Anesthesia problems Neg Hx   . Hypotension Neg Hx   . Malignant hyperthermia Neg Hx   . Pseudochol deficiency Neg Hx   :  Social History   Social History  . Marital status: Single    Spouse name: N/A  . Number of children: N/A  . Years of education: N/A   Occupational History  . Not on file.   Social History Main Topics  . Smoking status: Current Every Day Smoker    Packs/day: 1.00    Years: 33.00    Types: Cigarettes  . Smokeless tobacco: Never Used  . Alcohol use No  . Drug use: No  . Sexual activity: No      Comment: back problems   Other Topics Concern  . Not on file   Social History Narrative  . No narrative on file  :  Pertinent items are noted in HPI.  Exam: @IPVITALS @ Thin white male in no obvious distress. Vital signs show a temperature of 98.2. Pulse 75. Blood pressure 140/77. Weight is 109 pounds and neck exam shows no ocular or oral lesions. His oral mucosa is somewhat dry. He has no obvious adenopathy in the neck. Thyroid is nonpalpable. Lungs are with decreased breath sounds in both lung fields. Cardiac exam regular rate and rhythm with no murmurs, rubs or bruits. Abdomen is soft. He has the laparotomy scar from his surgery. He has no area of hypopigmentation at the feeding tube site. He says this is from leakage of stomach acid onto his skin. There is no palpable liver or spleen tip. Extremities shows no clubbing, cyanosis or edema. He has some slight muscle laxity in the upper and lower extremities. Skin exam shows no rashes, ecchymoses or petechia. Neurological exam shows no focal neurological deficits.   Recent Labs  04/05/16 1333  WBC 3.0*  HGB 10.2*  HCT 29.1*  PLT 183    Recent Labs  04/05/16 1333  NA 135*  K 4.7  CO2 29  GLUCOSE 68*  BUN 23.6  CREATININE 1.1  CALCIUM 9.2    Blood smear review:  None  Pathology: None     Assessment and Plan:  Bruce Olson is a 61 year old white male. He had HPV associated squamous her carcinoma of the left tonsil. He had stage II disease. He was treated aggressively with radiation and chemotherapy.  I would have to believe that he is cured. However, the fact that he is still smoking quite a bit definitely puts him at higher risk for recurrent disease or for a second primary cancer.  I would go ahead and get him set up with a PET scan for follow-up. I think this would be reasonable.  He is complaining of some xerostomia. I told him this may last another year or so. I'll try some [place] on him.  He is definitely at  risk for radiation-induced hypothyroidism. We will have to check his TSH whenever we see him.  My biggest worry is that he has the smoking history. I talked him about stopping tobacco use. He this is not that interested in doing that right now.  For now, our goals of care are surveillance. His risk definitely is for a second respiratory tract malignancy.  He comes in with his mother. They're both very nice. I gave him a prayer blanket. He was very thankful for this.  I will plan on getting the PET scan in another few weeks. I will plan to see him back myself in 3 months.  I spent about 1 hour with he and his mom.

## 2016-05-02 ENCOUNTER — Encounter: Payer: Medicare Other | Admitting: Registered Nurse

## 2016-05-03 ENCOUNTER — Encounter: Payer: Medicare Other | Attending: Physical Medicine and Rehabilitation | Admitting: Registered Nurse

## 2016-05-03 ENCOUNTER — Encounter: Payer: Self-pay | Admitting: Registered Nurse

## 2016-05-03 ENCOUNTER — Telehealth: Payer: Self-pay | Admitting: Registered Nurse

## 2016-05-03 VITALS — BP 134/76 | HR 67

## 2016-05-03 DIAGNOSIS — Z9889 Other specified postprocedural states: Secondary | ICD-10-CM

## 2016-05-03 DIAGNOSIS — M961 Postlaminectomy syndrome, not elsewhere classified: Secondary | ICD-10-CM

## 2016-05-03 DIAGNOSIS — M5416 Radiculopathy, lumbar region: Secondary | ICD-10-CM | POA: Diagnosis not present

## 2016-05-03 DIAGNOSIS — M791 Myalgia: Secondary | ICD-10-CM

## 2016-05-03 DIAGNOSIS — G894 Chronic pain syndrome: Secondary | ICD-10-CM | POA: Diagnosis not present

## 2016-05-03 DIAGNOSIS — M542 Cervicalgia: Secondary | ICD-10-CM | POA: Diagnosis not present

## 2016-05-03 DIAGNOSIS — Z79899 Other long term (current) drug therapy: Secondary | ICD-10-CM | POA: Diagnosis not present

## 2016-05-03 DIAGNOSIS — M7918 Myalgia, other site: Secondary | ICD-10-CM

## 2016-05-03 DIAGNOSIS — Z5181 Encounter for therapeutic drug level monitoring: Secondary | ICD-10-CM | POA: Diagnosis not present

## 2016-05-03 MED ORDER — TRAZODONE HCL 50 MG PO TABS: ORAL_TABLET | ORAL | 3 refills | Status: DC

## 2016-05-03 MED ORDER — OXYCODONE HCL ER 30 MG PO T12A: 30.0000 mg | EXTENDED_RELEASE_TABLET | Freq: Two times a day (BID) | ORAL | 0 refills | Status: DC

## 2016-05-03 MED ORDER — OXYCODONE-ACETAMINOPHEN 7.5-325 MG PO TABS: 1.0000 | ORAL_TABLET | ORAL | 0 refills | Status: DC | PRN

## 2016-05-03 NOTE — Progress Notes (Signed)
Subjective:    Patient ID: Bruce Olson, male    DOB: January 24, 1956, 61 y.o.   MRN: [id number]  HPI:  Bruce Olson is a 61 year old male who returns for follow up appointmentfor chronic pain and medication refill. He states his pain is located in his neck mainly right side radiating into right shoulder, and mid-lower back L> R also with right thigh numbness. He rates his pain 8. His current exercise regime is walking.  Pain Inventory Average Pain 8 Pain Right Now 8 My pain is sharp, stabbing and tingling  In the last 24 hours, has pain interfered with the following? General activity 8 Relation with others 8 Enjoyment of life 8 What TIME of day is your pain at its worst? all Sleep (in general) Poor  Pain is worse with: walking, sitting and standing Pain improves with: medication and TENS Relief from Meds: 2  Mobility use a cane ability to climb steps?  yes do you drive?  yes  Function disabled: date disabled 2012  Neuro/Psych weakness numbness tremor trouble walking  Prior Studies Any changes since last visit?  no  Physicians involved in your care Any changes since last visit?  no   Family History  Problem Relation Age of Onset  . Anesthesia problems Neg Hx   . Hypotension Neg Hx   . Malignant hyperthermia Neg Hx   . Pseudochol deficiency Neg Hx    Social History   Social History  . Marital status: Single    Spouse name: N/A  . Number of children: N/A  . Years of education: N/A   Social History Main Topics  . Smoking status: Current Every Day Smoker    Packs/day: 1.00    Years: 33.00    Types: Cigarettes  . Smokeless tobacco: Never Used  . Alcohol use No  . Drug use: No  . Sexual activity: No     Comment: back problems   Other Topics Concern  . Not on file   Social History Narrative  . No narrative on file   Past Surgical History:  Procedure Laterality Date  . BACK SURGERY  2013   Back surgery  (fusion)09/15/2010 & 2013  .  BACK SURGERY  03-2013  . HARDWARE REMOVAL N/A 03/14/2013   Procedure: HARDWARE REMOVAL;  Surgeon: Eustace Moore, MD;  Location: [facility];  Service: Neurosurgery;  Laterality: N/A;   Past Medical History:  Diagnosis Date  . Goals of care, counseling/discussion 04/07/2016  . No pertinent past medical history   . Occupational injury 2013   resulted in back injury that led to surgery  . Thyroid cancer ([place])    There were no vitals taken for this visit.  Opioid Risk Score:   Fall Risk Score:  `1  Depression screen PHQ 2/9  Depression screen [facility] 2/9 08/26/2015 09/22/2014 08/20/2014 04/21/2014  Decreased Interest 0 0 2 2  Down, Depressed, Hopeless 0 0 0 0  PHQ - 2 Score 0 0 2 2  Altered sleeping - - - 3  Tired, decreased energy - - - 3  Change in appetite - - - 0  Feeling bad or failure about yourself  - - - 0  Trouble concentrating - - - 2  Moving slowly or fidgety/restless - - - 0  Suicidal thoughts - - - 0  PHQ-9 Score - - - 10    Review of Systems  Constitutional: Negative.   HENT: Negative.   Eyes: Negative.  Respiratory: Negative.   Cardiovascular: Negative.   Gastrointestinal: Negative.   Endocrine: Negative.   Genitourinary: Negative.   Musculoskeletal: Negative.   Skin: Negative.   Allergic/Immunologic: Negative.   Neurological: Negative.   Hematological: Negative.   Psychiatric/Behavioral: Negative.   All other systems reviewed and are negative.      Objective:   Physical Exam  Constitutional: He is oriented to person, place, and time. He appears well-developed and well-nourished.  HENT:  Head: Normocephalic and atraumatic.  Neck: Normal range of motion. Neck supple.  Cardiovascular: Normal rate and regular rhythm.   Pulmonary/Chest: Effort normal and breath sounds normal.  Musculoskeletal:  Normal Muscle Bulk and Muscle Testing Reveals: Upper Extremities: Full ROM and Muscle Strength 5/5 Right AC Joint Tenderness Thoracic and Lumbar  Hypersensitivity Lower Extremities: Full ROM and Muscle Strength 5/5 Wear soft back brace Arises from Table slowly using straight cane for support Narrow Based gait   Neurological: He is alert and oriented to person, place, and time.  Skin: Skin is warm and dry.  Psychiatric: He has a normal mood and affect.  Nursing note and vitals reviewed.         Assessment & Plan:  1. Lumbar postlaminectomy syndrome status post lumbar fusion x3: His most recent surgery was 03/14/2013. He had hardware removal and bone allograft waist and L2-3. 05/03/2016 Refilled: oxyCODONE 7.5/325mg  one tablet every 4 hours as needed #100 and Oxycontin 30 mg one tablet every 12 hours #60.  We will continue the opioid monitoring program, this consists of regular clinic visits, examinations, urine drug screen, pill counts as well as use of [place] Controlled Substance Reporting System. 2. Lumbar Radiculopathy: Continue Lyrica. 05/03/2016 3. Insomnia: Continue Trazodone.05/03/2016  20 minutes of face to face patient care time was spent during this visit. All questions were encouraged and answered.   F/U in 1 month

## 2016-05-03 NOTE — Telephone Encounter (Signed)
On 05/03/2016 the [place] was reviewed no conflict was seen on the [name] with multiple prescribers. Bruce Olson  has a signed narcotic contract with our office. If there were any discrepancies this would have been reported to his physician.

## 2016-05-10 ENCOUNTER — Ambulatory Visit (HOSPITAL_COMMUNITY)
Admission: RE | Admit: 2016-05-10 | Discharge: 2016-05-10 | Disposition: A | Discharge status: Home / Self Care | Payer: Medicare Other | Source: Ambulatory Visit | Attending: Hematology & Oncology | Admitting: Hematology & Oncology

## 2016-05-10 DIAGNOSIS — I7090 Unspecified atherosclerosis: Secondary | ICD-10-CM | POA: Diagnosis not present

## 2016-05-10 DIAGNOSIS — K117 Disturbances of salivary secretion: Secondary | ICD-10-CM

## 2016-05-10 DIAGNOSIS — E034 Atrophy of thyroid (acquired): Secondary | ICD-10-CM | POA: Insufficient documentation

## 2016-05-10 DIAGNOSIS — J432 Centrilobular emphysema: Secondary | ICD-10-CM | POA: Insufficient documentation

## 2016-05-10 DIAGNOSIS — C090 Malignant neoplasm of tonsillar fossa: Secondary | ICD-10-CM

## 2016-05-10 DIAGNOSIS — R64 Cachexia: Secondary | ICD-10-CM | POA: Diagnosis not present

## 2016-05-10 DIAGNOSIS — Y842 Radiological procedure and radiotherapy as the cause of abnormal reaction of the patient, or of later complication, without mention of misadventure at the time of the procedure: Secondary | ICD-10-CM

## 2016-05-10 DIAGNOSIS — C099 Malignant neoplasm of tonsil, unspecified: Secondary | ICD-10-CM | POA: Diagnosis not present

## 2016-05-10 LAB — GLUCOSE, CAPILLARY: Glucose-Capillary: 97 mg/dL (ref 65–99)

## 2016-05-10 MED ORDER — FLUDEOXYGLUCOSE F - 18 (FDG) INJECTION
5.5000 | Freq: Once | INTRAVENOUS | Status: AC | PRN
  Administered 2016-05-10: 5.5 via INTRAVENOUS

## 2016-06-01 ENCOUNTER — Encounter: Payer: Self-pay | Admitting: Registered Nurse

## 2016-06-01 ENCOUNTER — Encounter: Payer: Medicare Other | Attending: Physical Medicine and Rehabilitation | Admitting: Registered Nurse

## 2016-06-01 ENCOUNTER — Telehealth: Payer: Self-pay | Admitting: Registered Nurse

## 2016-06-01 VITALS — BP 149/75 | HR 58 | Wt 106.0 lb

## 2016-06-01 DIAGNOSIS — Z79899 Other long term (current) drug therapy: Secondary | ICD-10-CM

## 2016-06-01 DIAGNOSIS — M791 Myalgia: Secondary | ICD-10-CM | POA: Diagnosis not present

## 2016-06-01 DIAGNOSIS — Z5181 Encounter for therapeutic drug level monitoring: Secondary | ICD-10-CM

## 2016-06-01 DIAGNOSIS — M961 Postlaminectomy syndrome, not elsewhere classified: Secondary | ICD-10-CM | POA: Diagnosis not present

## 2016-06-01 DIAGNOSIS — G4700 Insomnia, unspecified: Secondary | ICD-10-CM

## 2016-06-01 DIAGNOSIS — M5416 Radiculopathy, lumbar region: Secondary | ICD-10-CM | POA: Insufficient documentation

## 2016-06-01 DIAGNOSIS — M542 Cervicalgia: Secondary | ICD-10-CM

## 2016-06-01 DIAGNOSIS — G894 Chronic pain syndrome: Secondary | ICD-10-CM | POA: Diagnosis not present

## 2016-06-01 DIAGNOSIS — Z9889 Other specified postprocedural states: Secondary | ICD-10-CM

## 2016-06-01 DIAGNOSIS — M7918 Myalgia, other site: Secondary | ICD-10-CM

## 2016-06-01 MED ORDER — OXYCODONE-ACETAMINOPHEN 7.5-325 MG PO TABS: 1.0000 | ORAL_TABLET | ORAL | 0 refills | Status: DC | PRN

## 2016-06-01 MED ORDER — OXYCODONE HCL ER 30 MG PO T12A: 30.0000 mg | EXTENDED_RELEASE_TABLET | Freq: Two times a day (BID) | ORAL | 0 refills | Status: DC

## 2016-06-01 NOTE — Telephone Encounter (Signed)
On 06/01/2016 the [place] was reviewed no conflict was seen on the [place] with multiple prescribers. Bruce Olson has a signed narcotic contract with our office. If there were any discrepancies this would have been reported to his physician.

## 2016-06-01 NOTE — Progress Notes (Signed)
Subjective:    Patient ID: Bruce Olson, male    DOB: 07-25-1955, 61 y.o.   MRN: [id number]  HPI: Mr. QAIS JOWERS is a 61 year old male who returns for follow up appointmentfor chronic pain and medication refill. He states his pain is located in his neck radiating into his right shoulder and lower back pain. He rates his pain 8. His current exercise regime is walking.  Mr. Shugart watching his weight, he states his appetite is good. Encouraged to purchase a scale her verbalizes understanding. Today's weight without back brace.   His last UDS was on 12/02/15 it was consistent. UDS was ordered for today.   Pain Inventory Average Pain 8 Pain Right Now 8 My pain is sharp and stabbing  In the last 24 hours, has pain interfered with the following? General activity 8 Relation with others 8 Enjoyment of life 8 What TIME of day is your pain at its worst? all Sleep (in general) Poor  Pain is worse with: walking, sitting and standing Pain improves with: medication and TENS Relief from Meds: 2  Mobility walk without assistance use a cane how many minutes can you walk? 15 ability to climb steps?  yes do you drive?  yes  Function disabled: date disabled 05/2010  Neuro/Psych weakness numbness tremor trouble walking  Prior Studies Any changes since last visit?  no  Physicians involved in your care Any changes since last visit?  no   Family History  Problem Relation Age of Onset  . Anesthesia problems Neg Hx   . Hypotension Neg Hx   . Malignant hyperthermia Neg Hx   . Pseudochol deficiency Neg Hx    Social History   Social History  . Marital status: Single    Spouse name: N/A  . Number of children: N/A  . Years of education: N/A   Social History Main Topics  . Smoking status: Current Every Day Smoker    Packs/day: 1.00    Years: 33.00    Types: Cigarettes  . Smokeless tobacco: Never Used  . Alcohol use No  . Drug use: No  . Sexual activity: No      Comment: back problems   Other Topics Concern  . None   Social History Narrative  . None   Past Surgical History:  Procedure Laterality Date  . BACK SURGERY  2013   Back surgery  (fusion)09/15/2010 & 2013  . BACK SURGERY  03-2013  . HARDWARE REMOVAL N/A 03/14/2013   Procedure: HARDWARE REMOVAL;  Surgeon: Eustace Moore, MD;  Location: [facility];  Service: Neurosurgery;  Laterality: N/A;   Past Medical History:  Diagnosis Date  . Goals of care, counseling/discussion 04/07/2016  . No pertinent past medical history   . Occupational injury 2013   resulted in back injury that led to surgery  . Thyroid cancer ([facility])    BP (!) 149/75   Pulse (!) 54   Wt 106 lb (48.1 kg)   SpO2 98%   BMI 17.11 kg/m   Opioid Risk Score:   Fall Risk Score:  `1  Depression screen PHQ 2/9  Depression screen [facility] 2/9 06/01/2016 08/26/2015 09/22/2014 08/20/2014 04/21/2014  Decreased Interest 0 0 0 2 2  Down, Depressed, Hopeless 0 0 0 0 0  PHQ - 2 Score 0 0 0 2 2  Altered sleeping - - - - 3  Tired, decreased energy - - - - 3  Change in appetite - - - - 0  Feeling bad or failure about yourself  - - - - 0  Trouble concentrating - - - - 2  Moving slowly or fidgety/restless - - - - 0  Suicidal thoughts - - - - 0  PHQ-9 Score - - - - 10    Review of Systems  HENT: Negative.   Eyes: Negative.   Respiratory: Negative.   Cardiovascular: Negative.   Gastrointestinal: Negative.   Endocrine: Negative.   Genitourinary: Negative.   Musculoskeletal: Positive for gait problem.  Skin: Negative.   Allergic/Immunologic: Negative.   Neurological: Positive for tremors, weakness and numbness.  Hematological: Negative.   Psychiatric/Behavioral: Negative.   All other systems reviewed and are negative.      Objective:   Physical Exam  Constitutional: He is oriented to person, place, and time. He appears well-developed and well-nourished.  HENT:  Head: Normocephalic and atraumatic.  Neck: Normal range of  motion. Neck supple.  Cardiovascular: Normal rate and regular rhythm.   Pulmonary/Chest: Effort normal and breath sounds normal.  Musculoskeletal:  Normal Muscle Bulk and Muscle Testing Reveals: Upper Extremities: Full ROM and Muscle Strength 5/5 Lumbar Hypersensitivity Lower Extremities: Full ROM and Muscle Strength 5/5 Arises from Table slowly Narrow Based Gait  Neurological: He is alert and oriented to person, place, and time.  Skin: Skin is warm and dry.  Psychiatric: He has a normal mood and affect.  Nursing note and vitals reviewed.         Assessment & Plan:  1. Lumbar postlaminectomy syndrome status post lumbar fusion x3: His most recent surgery was 03/14/2013. He had hardware removal and bone allograft waist and L2-3. 06/01/2016 Refilled: oxyCODONE 7.5/325mg  one tablet every 4 hours as needed #100 and Oxycontin 30 mg one tablet every 12 hours #60.  We will continue the opioid monitoring program, this consists of regular clinic visits, examinations, urine drug screen, pill counts as well as use of [place] Controlled Substance Reporting System. 2. Lumbar Radiculopathy: Continue Lyrica. 06/01/2016 3. Insomnia: Continue Trazodone.06/01/2016 4. Nausea: Continue Zofran as needed.   20  minutes of face to face patient care time was spent during this visit. All questions were encouraged and answered.  F/U in 1 month

## 2016-06-06 LAB — TOXASSURE SELECT,+ANTIDEPR,UR

## 2016-06-07 ENCOUNTER — Telehealth: Payer: Self-pay | Admitting: *Deleted

## 2016-06-07 NOTE — Telephone Encounter (Signed)
Urine drug screen for this encounter is consistent for prescribed medication. Trazodone is prn.

## 2016-06-21 ENCOUNTER — Other Ambulatory Visit: Payer: Self-pay | Admitting: Hematology & Oncology

## 2016-06-22 ENCOUNTER — Telehealth: Payer: Self-pay | Admitting: *Deleted

## 2016-06-22 NOTE — Telephone Encounter (Addendum)
Patient is aware of results  ----- Message from [name], MD sent at 06/21/2016  3:26 PM EDT ----- Call - No obvious tumor is seen on the PET scan, but there is activity on the right side of the tongue.  This looks like physiologic muscle activity.  We wil need to watch this however!!  I want to see him in 3 weeks!!!  pete

## 2016-06-30 ENCOUNTER — Encounter (HOSPITAL_BASED_OUTPATIENT_CLINIC_OR_DEPARTMENT_OTHER): Payer: Medicare Other | Admitting: Registered Nurse

## 2016-06-30 ENCOUNTER — Encounter: Payer: Self-pay | Admitting: Registered Nurse

## 2016-06-30 VITALS — BP 153/73 | HR 69 | Resp 14

## 2016-06-30 DIAGNOSIS — M961 Postlaminectomy syndrome, not elsewhere classified: Secondary | ICD-10-CM | POA: Diagnosis not present

## 2016-06-30 DIAGNOSIS — G894 Chronic pain syndrome: Secondary | ICD-10-CM | POA: Diagnosis not present

## 2016-06-30 DIAGNOSIS — M791 Myalgia: Secondary | ICD-10-CM

## 2016-06-30 DIAGNOSIS — Z5181 Encounter for therapeutic drug level monitoring: Secondary | ICD-10-CM | POA: Diagnosis not present

## 2016-06-30 DIAGNOSIS — Z9889 Other specified postprocedural states: Secondary | ICD-10-CM

## 2016-06-30 DIAGNOSIS — M542 Cervicalgia: Secondary | ICD-10-CM | POA: Diagnosis not present

## 2016-06-30 DIAGNOSIS — M5416 Radiculopathy, lumbar region: Secondary | ICD-10-CM | POA: Diagnosis not present

## 2016-06-30 DIAGNOSIS — Z79899 Other long term (current) drug therapy: Secondary | ICD-10-CM

## 2016-06-30 DIAGNOSIS — M7918 Myalgia, other site: Secondary | ICD-10-CM

## 2016-06-30 DIAGNOSIS — G4700 Insomnia, unspecified: Secondary | ICD-10-CM

## 2016-06-30 MED ORDER — OXYCODONE-ACETAMINOPHEN 7.5-325 MG PO TABS: 1.0000 | ORAL_TABLET | ORAL | 0 refills | Status: DC | PRN

## 2016-06-30 MED ORDER — OXYCODONE HCL ER 30 MG PO T12A: 30.0000 mg | EXTENDED_RELEASE_TABLET | Freq: Two times a day (BID) | ORAL | 0 refills | Status: DC

## 2016-06-30 NOTE — Progress Notes (Signed)
Subjective:    Patient ID: Bruce Olson, male    DOB: [birth year], 61 y.o.   MRN: [id number]  HPI: Bruce Olson is a 61 year old male who returns for follow up appointmentfor chronic pain and medication refill. He states his pain is located in his neck radiating into his right shoulder and lower back pain. He rates his pain 8. His current exercise regime is walking.  Bruce Olson states he received a call from his oncologist staff regarding his PET Scan, they noticed some activity on his tongue on the right side. Oncology note was reviewed. Oncologist following and he has a scheduled appointment.   Last UDS was performed on 06/01/2016, it was consistent.    Pain Inventory Average Pain 8 Pain Right Now 8 My pain is sharp, stabbing and tingling  In the last 24 hours, has pain interfered with the following? General activity 8 Relation with others 8 Enjoyment of life 8 What TIME of day is your pain at its worst? all Sleep (in general) Poor  Pain is worse with: walking, sitting and standing Pain improves with: medication and TENS Relief from Meds: 2  Mobility walk without assistance use a cane how many minutes can you walk? 10 ability to climb steps?  yes do you drive?  yes  Function disabled: date disabled .  Neuro/Psych weakness numbness tremor trouble walking  Prior Studies Any changes since last visit?  yes  Physicians involved in your care Any changes since last visit?  yes   Family History  Problem Relation Age of Onset  . Anesthesia problems Neg Hx   . Hypotension Neg Hx   . Malignant hyperthermia Neg Hx   . Pseudochol deficiency Neg Hx    Social History   Social History  . Marital status: Single    Spouse name: N/A  . Number of children: N/A  . Years of education: N/A   Social History Main Topics  . Smoking status: Current Every Day Smoker    Packs/day: 1.00    Years: 33.00    Types: Cigarettes  . Smokeless tobacco: Never  Used  . Alcohol use No  . Drug use: No  . Sexual activity: No     Comment: back problems   Other Topics Concern  . None   Social History Narrative  . None   Past Surgical History:  Procedure Laterality Date  . BACK SURGERY  2013   Back surgery  (fusion)09/15/2010 & 2013  . BACK SURGERY  03-2013  . HARDWARE REMOVAL N/A 03/14/2013   Procedure: HARDWARE REMOVAL;  Surgeon: Bruce Moore, MD;  Location: [place] [facility];  Service: Neurosurgery;  Laterality: N/A;   Past Medical History:  Diagnosis Date  . Goals of care, counseling/discussion 04/07/2016  . No pertinent past medical history   . Occupational injury 2013   resulted in back injury that led to surgery  . Thyroid cancer ([facility])    BP (!) 153/73 (BP Location: Right Arm, Patient Position: Sitting, Cuff Size: Normal)   Pulse 69   Resp 14   SpO2 97%   Opioid Risk Score:   Fall Risk Score:  `1  Depression screen PHQ 2/9  Depression screen [facility] 2/9 06/01/2016 08/26/2015 09/22/2014 08/20/2014 04/21/2014  Decreased Interest 0 0 0 2 2  Down, Depressed, Hopeless 0 0 0 0 0  PHQ - 2 Score 0 0 0 2 2  Altered sleeping - - - - 3  Tired, decreased energy - - - -  3  Change in appetite - - - - 0  Feeling bad or failure about yourself  - - - - 0  Trouble concentrating - - - - 2  Moving slowly or fidgety/restless - - - - 0  Suicidal thoughts - - - - 0  PHQ-9 Score - - - - 10   Review of Systems  HENT: Negative.   Eyes: Negative.   Respiratory: Negative.   Cardiovascular: Negative.   Gastrointestinal: Negative.   Endocrine: Negative.   Genitourinary: Negative.   Musculoskeletal: Positive for arthralgias and gait problem.  Allergic/Immunologic: Negative.   Neurological: Positive for tremors, weakness and numbness.  Hematological: Negative.   Psychiatric/Behavioral: Negative.   All other systems reviewed and are negative.      Objective:   Physical Exam  Constitutional: He is oriented to person, place, and time. He appears  well-developed and well-nourished.  HENT:  Head: Normocephalic and atraumatic.  Neck: Normal range of motion. Neck supple.  Cardiovascular: Normal rate and regular rhythm.   Pulmonary/Chest: Effort normal and breath sounds normal.  Musculoskeletal:  Normal Muscle Bulk and Muscle Testing Reveals: Upper Extremities: Full ROM and Muscle Strength 5/5 Right AC Joint Tenderness Thoracic Paraspinal Tenderness: T-1-T-3 Mainly Right Side Lumbar Hypersensitivity Lower Extremities: Full ROM and Muscle Strength 5/5 Arises from Table Slowly Narrow Based Gait   Neurological: He is alert and oriented to person, place, and time.  Skin: Skin is warm and dry.  Psychiatric: He has a normal mood and affect.  Nursing note and vitals reviewed.         Assessment & Plan:  1. Lumbar postlaminectomy syndrome status post lumbar fusion x3: His most recent surgery was 03/14/2013. He had hardware removal and bone allograft waist and L2-3. 06/30/2016 Refilled: oxyCODONE 7.5/325mg  one tablet every 4 hours as needed #100 and Oxycontin 30 mg one tablet every 12 hours #60.  We will continue the opioid monitoring program, this consists of regular clinic visits, examinations, urine drug screen, pill counts as well as use of [place] Controlled Substance Reporting System. 2. Lumbar Radiculopathy: Continue Lyrica. 06/30/2016 3. Insomnia: ContinueTrazodone.06/30/2016 4. Nausea: Continue Zofran as needed. 06/30/2016  20 minutes of face to face patient care time was spent during this visit. All questions were encouraged and answered.   F/U in 1 month

## 2016-07-07 ENCOUNTER — Other Ambulatory Visit (HOSPITAL_BASED_OUTPATIENT_CLINIC_OR_DEPARTMENT_OTHER): Payer: Medicare Other

## 2016-07-07 ENCOUNTER — Ambulatory Visit (HOSPITAL_BASED_OUTPATIENT_CLINIC_OR_DEPARTMENT_OTHER): Payer: Medicare Other | Admitting: Hematology & Oncology

## 2016-07-07 VITALS — BP 145/61 | HR 61 | Temp 97.9°F | Resp 17 | Wt 108.0 lb

## 2016-07-07 DIAGNOSIS — K117 Disturbances of salivary secretion: Secondary | ICD-10-CM

## 2016-07-07 DIAGNOSIS — C090 Malignant neoplasm of tonsillar fossa: Secondary | ICD-10-CM

## 2016-07-07 DIAGNOSIS — R634 Abnormal weight loss: Secondary | ICD-10-CM

## 2016-07-07 DIAGNOSIS — Y842 Radiological procedure and radiotherapy as the cause of abnormal reaction of the patient, or of later complication, without mention of misadventure at the time of the procedure: Secondary | ICD-10-CM

## 2016-07-07 DIAGNOSIS — E034 Atrophy of thyroid (acquired): Secondary | ICD-10-CM

## 2016-07-07 DIAGNOSIS — G8929 Other chronic pain: Secondary | ICD-10-CM

## 2016-07-07 DIAGNOSIS — M40209 Unspecified kyphosis, site unspecified: Secondary | ICD-10-CM

## 2016-07-07 DIAGNOSIS — Z85818 Personal history of malignant neoplasm of other sites of lip, oral cavity, and pharynx: Secondary | ICD-10-CM

## 2016-07-07 LAB — CMP (CANCER CENTER ONLY)
ALT: 21 U/L (ref 10–47)
AST: 27 U/L (ref 11–38)
Albumin: 3.6 g/dL (ref 3.3–5.5)
Alkaline Phosphatase: 67 U/L (ref 26–84)
BILIRUBIN TOTAL: 0.5 mg/dL (ref 0.20–1.60)
BUN, Bld: 17 mg/dL (ref 7–22)
CALCIUM: 9.1 mg/dL (ref 8.0–10.3)
CO2: 28 mEq/L (ref 18–33)
Chloride: 101 mEq/L (ref 98–108)
Creat: 1 mg/dl (ref 0.6–1.2)
GLUCOSE: 85 mg/dL (ref 73–118)
Potassium: 4.5 mEq/L (ref 3.3–4.7)
Sodium: 138 mEq/L (ref 128–145)
Total Protein: 6.8 g/dL (ref 6.4–8.1)

## 2016-07-07 LAB — CBC WITH DIFFERENTIAL (CANCER CENTER ONLY)
BASO#: 0 10*3/uL (ref 0.0–0.2)
BASO%: 0.3 % (ref 0.0–2.0)
EOS%: 1 % (ref 0.0–7.0)
Eosinophils Absolute: 0 10*3/uL (ref 0.0–0.5)
HEMATOCRIT: 34.1 % — AB (ref 38.7–49.9)
HEMOGLOBIN: 11.9 g/dL — AB (ref 13.0–17.1)
LYMPH#: 0.8 10*3/uL — AB (ref 0.9–3.3)
LYMPH%: 20.5 % (ref 14.0–48.0)
MCH: 31.7 pg (ref 28.0–33.4)
MCHC: 34.9 g/dL (ref 32.0–35.9)
MCV: 91 fL (ref 82–98)
MONO#: 0.6 10*3/uL (ref 0.1–0.9)
MONO%: 14.8 % — ABNORMAL HIGH (ref 0.0–13.0)
NEUT%: 63.4 % (ref 40.0–80.0)
NEUTROS ABS: 2.5 10*3/uL (ref 1.5–6.5)
Platelets: 203 10*3/uL (ref 145–400)
RBC: 3.75 10*6/uL — AB (ref 4.20–5.70)
RDW: 12.5 % (ref 11.1–15.7)
WBC: 3.9 10*3/uL — AB (ref 4.0–10.0)

## 2016-07-07 LAB — TSH: TSH: 1.333 m[IU]/L (ref 0.320–4.118)

## 2016-07-07 MED ORDER — OXANDROLONE 10 MG PO TABS: 10.0000 mg | ORAL_TABLET | Freq: Two times a day (BID) | ORAL | 2 refills | Status: DC

## 2016-07-07 NOTE — Progress Notes (Signed)
Hematology and Oncology Follow Up Visit  Bruce Olson [id number] 1955-12-14 61 y.o. 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 This is second office visit. We first saw him back in March. We did do a PET scan on him. PET scan did not show any obvious recurrent malignancy. However, there was a lot of activity with respect to his tongue. The radiologist thought that this was physiologic. However, this is some that we will have to follow-up.  The big problem is that he is not gaining weight. His weight 108 pounds. He, I think, has part of his seventh missing. He apparently had a lot of issues with a feeding tube.  I will try him on some Oxandrin. Maybe Oxandrin at 10 mg day will help him gain weight.  He does have chronic pain issues. He has some kyphosis.  He has had no bleeding. He's had no diarrhea. He's had no fever.  Overall, his performance status is ECOG 2.  Medications:  Current Outpatient Prescriptions:  .  docusate sodium (COLACE) 100 MG capsule, Take 100 mg by mouth daily., Disp: , Rfl:  .  Multiple Vitamin (MULITIVITAMIN WITH MINERALS) TABS, Take 1 tablet by mouth daily., Disp: , Rfl:  .  ondansetron (ZOFRAN ODT) 8 MG disintegrating tablet, Take 1 tablet (8 mg total) by mouth every 8 (eight) hours as needed for nausea or vomiting., Disp: 30 tablet, Rfl: 0 .  oxandrolone (OXANDRIN) 10 MG tablet, Take 1 tablet (10 mg total) by mouth 2 (two) times daily., Disp: 30 tablet, Rfl: 2 .  oxyCODONE (OXYCONTIN) 30 MG 12 hr tablet, Take 30 mg by mouth 2 (two) times daily., Disp: 60 each, Rfl: 0 .  oxyCODONE-acetaminophen (PERCOCET) 7.5-325 MG tablet, Take 1 tablet by mouth every 4 (four) hours as needed. May take one extra tablet when pain is sever.  No more than 4 a day, Disp: 100 tablet, Rfl: 0 .  pilocarpine (SALAGEN) 5 MG tablet, Take 1 tablet (5 mg total) by mouth 2 (two) times daily., Disp: 60 tablet, Rfl: 6 .  pregabalin (LYRICA) 100 MG capsule, Take 1 capsule (100 mg total) by mouth 3 (three) times daily., Disp: 90 capsule, Rfl: 5 .  traZODone (DESYREL) 50 MG tablet, Take 1 tablet at bedtime as needed, Disp: 30 tablet, Rfl: 3  Allergies: No Known Allergies  Past Medical History, Surgical history, Social history, and Family History were reviewed and updated.  Review of Systems:  As above  Physical Exam:  weight is 108 lb (49 kg). His oral temperature is 97.9 F (36.6 C). His blood pressure is 145/61 (abnormal) and his pulse is 61. His respiration is 17 and oxygen saturation is 100%.   Wt Readings from Last 3 Encounters:  07/07/16 108 lb (49 kg)  06/01/16 106 lb (48.1 kg)  04/06/16 109 lb 1.9 oz (49.5 kg)     Head and neck exam shows no ocular or oral lesions. His oral mucosa is somewhat dry. He has no obvious adenopathy in the neck. Thyroid is nonpalpable. Lungs are with decreased breath sounds in both lung fields. Cardiac exam regular rate and rhythm with no murmurs, rubs or bruits. Abdomen is soft. He has the laparotomy scar from his surgery. He has an area of  hypopigmentation at the feeding tube site. He says this is from leakage of stomach acid onto his skin. There is no palpable liver or spleen tip. Extremities shows no clubbing, cyanosis or edema. He has some slight muscle laxity in the upper and lower extremities. Skin exam shows no rashes, ecchymoses or petechia. Neurological exam shows no focal neurological deficits.  Lab Results  Component Value Date   WBC 3.9 (L) 07/07/2016   HGB 11.9 (L) 07/07/2016   HCT 34.1 (L) 07/07/2016   MCV 91 07/07/2016   PLT 203 07/07/2016     Chemistry      Component Value Date/Time   NA 138 07/07/2016 1132   NA 135 (L) 04/05/2016 1333   K 4.5 07/07/2016 1132   K 4.7 04/05/2016  1333   CL 101 07/07/2016 1132   CO2 28 07/07/2016 1132   CO2 29 04/05/2016 1333   BUN 17 07/07/2016 1132   BUN 23.6 04/05/2016 1333   CREATININE 1.0 07/07/2016 1132   CREATININE 1.1 04/05/2016 1333      Component Value Date/Time   CALCIUM 9.1 07/07/2016 1132   CALCIUM 9.2 04/05/2016 1333   ALKPHOS 67 07/07/2016 1132   ALKPHOS 67 04/05/2016 1333   AST 27 07/07/2016 1132   AST 17 04/05/2016 1333   ALT 21 07/07/2016 1132   ALT 10 04/05/2016 1333   BILITOT 0.50 07/07/2016 1132   BILITOT 0.30 04/05/2016 1333         Impression and Plan: Bruce Olson is a33 year old white male. He has locally advanced squamous or carcinoma the left tonsil. Given that this is HPV positive, I think that he probably is cured.  Again, the weight is a real problem. I'm not sure why he has lost all the weight. I cannot see any obvious recurrent disease. However, this is something that we have to watch out for.  I think another PET scan is necessary. We will set one up in a couple months. If there is any changes, then we may have to consider a biopsy.  Hopefully, the Oxandrin will help with his weight gain.  I just feel bad for him. I know that he is trying his best.  I spent about 28 Ms. with he and his mom.  [name], MD 6/7/20186:02 PM

## 2016-07-13 ENCOUNTER — Encounter: Payer: Medicare Other | Attending: Physical Medicine and Rehabilitation | Admitting: Registered Nurse

## 2016-07-13 ENCOUNTER — Encounter: Payer: Self-pay | Admitting: Registered Nurse

## 2016-07-13 VITALS — BP 152/76 | HR 71 | Resp 14 | Wt 110.0 lb

## 2016-07-13 DIAGNOSIS — Z5181 Encounter for therapeutic drug level monitoring: Secondary | ICD-10-CM | POA: Insufficient documentation

## 2016-07-13 DIAGNOSIS — G4700 Insomnia, unspecified: Secondary | ICD-10-CM

## 2016-07-13 DIAGNOSIS — M7918 Myalgia, other site: Secondary | ICD-10-CM

## 2016-07-13 DIAGNOSIS — Z9889 Other specified postprocedural states: Secondary | ICD-10-CM

## 2016-07-13 DIAGNOSIS — G894 Chronic pain syndrome: Secondary | ICD-10-CM

## 2016-07-13 DIAGNOSIS — M961 Postlaminectomy syndrome, not elsewhere classified: Secondary | ICD-10-CM | POA: Insufficient documentation

## 2016-07-13 DIAGNOSIS — M542 Cervicalgia: Secondary | ICD-10-CM | POA: Diagnosis not present

## 2016-07-13 DIAGNOSIS — M791 Myalgia: Secondary | ICD-10-CM

## 2016-07-13 DIAGNOSIS — M5416 Radiculopathy, lumbar region: Secondary | ICD-10-CM | POA: Diagnosis not present

## 2016-07-13 DIAGNOSIS — Z79899 Other long term (current) drug therapy: Secondary | ICD-10-CM

## 2016-07-13 MED ORDER — OXYCODONE-ACETAMINOPHEN 7.5-325 MG PO TABS: 1.0000 | ORAL_TABLET | ORAL | 0 refills | Status: DC | PRN

## 2016-07-13 MED ORDER — OXYCODONE HCL ER 30 MG PO T12A: 30.0000 mg | EXTENDED_RELEASE_TABLET | Freq: Two times a day (BID) | ORAL | 0 refills | Status: DC

## 2016-07-13 NOTE — Progress Notes (Signed)
Subjective:    Patient ID: Bruce Olson, male    DOB: September 17, 1955, 61 y.o.   MRN: [id number]  HPI:  [name] is a 61 year old male who returns for follow up appointmentfor chronic pain and medication refill. He states his pain is located in his neck radiating into his right shoulder, upper backand lower back pain.He rates his pain 8. His current exercise regime is walking.  Mr. Bucklew states his appetite is good hasn't been able to gain weight. Oncologist prescribed Oxandrolone.   Last UDS was performed on 06/01/2016, it was consistent.   Pain Inventory Average Pain 8 Pain Right Now 8 My pain is sharp, stabbing and tingling  In the last 24 hours, has pain interfered with the following? General activity 8 Relation with others 8 Enjoyment of life 8 What TIME of day is your pain at its worst? all Sleep (in general) Poor  Pain is worse with: walking, sitting and standing Pain improves with: medication and TENS Relief from Meds: 2  Mobility walk without assistance walk with assistance use a cane how many minutes can you walk? 10 ability to climb steps?  yes do you drive?  yes  Function disabled: date disabled .  Neuro/Psych weakness numbness tremor trouble walking  Prior Studies Any changes since last visit?  no  Physicians involved in your care Any changes since last visit?  no   Family History  Problem Relation Age of Onset  . Anesthesia problems Neg Hx   . Hypotension Neg Hx   . Malignant hyperthermia Neg Hx   . Pseudochol deficiency Neg Hx    Social History   Social History  . Marital status: Single    Spouse name: N/A  . Number of children: N/A  . Years of education: N/A   Social History Main Topics  . Smoking status: Current Every Day Smoker    Packs/day: 1.00    Years: 33.00    Types: Cigarettes  . Smokeless tobacco: Never Used  . Alcohol use No  . Drug use: No  . Sexual activity: No     Comment: back problems     Other Topics Concern  . None   Social History Narrative  . None   Past Surgical History:  Procedure Laterality Date  . BACK SURGERY  2013   Back surgery  (fusion)09/15/2010 & 2013  . BACK SURGERY  03-2013  . HARDWARE REMOVAL N/A 03/14/2013   Procedure: HARDWARE REMOVAL;  Surgeon: Eustace Moore, MD;  Location: [place] [facility];  Service: Neurosurgery;  Laterality: N/A;   Past Medical History:  Diagnosis Date  . Goals of care, counseling/discussion 04/07/2016  . No pertinent past medical history   . Occupational injury 2013   resulted in back injury that led to surgery  . Thyroid cancer (HCC)    BP (!) 152/76 (BP Location: Right Arm, Patient Position: Sitting, Cuff Size: Normal)   Pulse 71   Resp 14   Wt 110 lb (49.9 kg)   SpO2 95%   BMI 17.75 kg/m   Opioid Risk Score:   Fall Risk Score:  `1  Depression screen PHQ 2/9  Depression screen [facility] 2/9 06/01/2016 08/26/2015 09/22/2014 08/20/2014 04/21/2014  Decreased Interest 0 0 0 2 2  Down, Depressed, Hopeless 0 0 0 0 0  PHQ - 2 Score 0 0 0 2 2  Altered sleeping - - - - 3  Tired, decreased energy - - - - 3  Change in  appetite - - - - 0  Feeling bad or failure about yourself  - - - - 0  Trouble concentrating - - - - 2  Moving slowly or fidgety/restless - - - - 0  Suicidal thoughts - - - - 0  PHQ-9 Score - - - - 10    Review of Systems  HENT: Negative.   Eyes: Negative.   Respiratory: Negative.   Cardiovascular: Negative.   Gastrointestinal: Negative.   Endocrine: Negative.   Genitourinary: Negative.   Musculoskeletal: Positive for back pain and gait problem.  Skin: Negative.   Allergic/Immunologic: Negative.   Neurological: Positive for tremors, weakness and numbness.  Hematological: Negative.   Psychiatric/Behavioral: Negative.   All other systems reviewed and are negative.      Objective:   Physical Exam  Constitutional: He is oriented to person, place, and time. He appears well-developed and well-nourished.  HENT:   Head: Normocephalic and atraumatic.  Neck: Normal range of motion. Neck supple.  Cervical Paraspinal Tenderness: C-4-C-6  Cardiovascular: Normal rate and regular rhythm.   Pulmonary/Chest: Effort normal and breath sounds normal.  Musculoskeletal:  Normal Muscle Bulk and Muscle Testing Reveals:  Upper Extremities: Full ROM and Muscle Strength 5/5 Thoracic Paraspinal Tenderness: Mainly Right Side: T-1-T-3 Lumbar Paraspinal Tenderness: L-3-L-5 Lower Extremities: Full ROM and Muscle Strength 5/5 Arises from Table with ease Narrow Based Gait  Neurological: He is alert and oriented to person, place, and time.  Skin: Skin is warm and dry.  Psychiatric: He has a normal mood and affect.  Nursing note and vitals reviewed.         Assessment & Plan:  1. Lumbar postlaminectomy syndrome status post lumbar fusion x3: His most recent surgery was 03/14/2013. He had hardware removal and bone allograft waist and L2-3. 07/13/2016 Refilled: oxyCODONE 7.5/325mg  one tablet every 4 hours as needed #100 and Oxycontin 30 mg one tablet every 12 hours #60.  We will continue the opioid monitoring program, this consists of regular clinic visits, examinations, urine drug screen, pill counts as well as use of [place] Controlled Substance Reporting System. 2. Lumbar Radiculopathy: Continue Lyrica. 07/13/2016 3. Insomnia: ContinueTrazodone.07/13/2016 4. Nausea: Continue Zofran as needed. 07/13/2016  15 minutes of face to face patient care time was spent during this visit. All questions were encouraged and answered.   F/U in 1 month

## 2016-08-25 ENCOUNTER — Encounter: Payer: Medicare Other | Attending: Physical Medicine and Rehabilitation | Admitting: Registered Nurse

## 2016-08-25 ENCOUNTER — Encounter: Payer: Self-pay | Admitting: Registered Nurse

## 2016-08-25 VITALS — BP 127/70 | HR 77

## 2016-08-25 DIAGNOSIS — Z9889 Other specified postprocedural states: Secondary | ICD-10-CM | POA: Diagnosis not present

## 2016-08-25 DIAGNOSIS — Z79899 Other long term (current) drug therapy: Secondary | ICD-10-CM | POA: Diagnosis not present

## 2016-08-25 DIAGNOSIS — M791 Myalgia: Secondary | ICD-10-CM | POA: Diagnosis not present

## 2016-08-25 DIAGNOSIS — M542 Cervicalgia: Secondary | ICD-10-CM

## 2016-08-25 DIAGNOSIS — G4700 Insomnia, unspecified: Secondary | ICD-10-CM | POA: Diagnosis not present

## 2016-08-25 DIAGNOSIS — Z5181 Encounter for therapeutic drug level monitoring: Secondary | ICD-10-CM | POA: Diagnosis not present

## 2016-08-25 DIAGNOSIS — M7918 Myalgia, other site: Secondary | ICD-10-CM

## 2016-08-25 DIAGNOSIS — M5416 Radiculopathy, lumbar region: Secondary | ICD-10-CM | POA: Diagnosis not present

## 2016-08-25 DIAGNOSIS — M961 Postlaminectomy syndrome, not elsewhere classified: Secondary | ICD-10-CM

## 2016-08-25 DIAGNOSIS — G894 Chronic pain syndrome: Secondary | ICD-10-CM | POA: Diagnosis not present

## 2016-08-25 MED ORDER — TRAZODONE HCL 50 MG PO TABS: ORAL_TABLET | ORAL | 3 refills | Status: DC

## 2016-08-25 MED ORDER — ONDANSETRON 8 MG PO TBDP
8.0000 mg | ORAL_TABLET | Freq: Three times a day (TID) | ORAL | 0 refills | Status: DC | PRN
Start: 2016-08-25 — End: 2016-12-19

## 2016-08-25 MED ORDER — OXYCODONE-ACETAMINOPHEN 7.5-325 MG PO TABS: 1.0000 | ORAL_TABLET | ORAL | 0 refills | Status: DC | PRN

## 2016-08-25 MED ORDER — PREGABALIN 100 MG PO CAPS
100.0000 mg | ORAL_CAPSULE | Freq: Three times a day (TID) | ORAL | 5 refills | Status: DC
Start: 2016-08-25 — End: 2017-02-23

## 2016-08-25 MED ORDER — OXYCODONE HCL ER 30 MG PO T12A: 30.0000 mg | EXTENDED_RELEASE_TABLET | Freq: Two times a day (BID) | ORAL | 0 refills | Status: DC

## 2016-08-25 NOTE — Progress Notes (Signed)
Subjective:    Patient ID: Bruce Olson, male    DOB: 06/26/1955, 61 y.o.   MRN: [id number]  HPI: [name] is a 61 year old male who returns for follow up appointmentfor chronic pain and medication refill. He states his pain is located in his neck radiating into his right shoulder, upper backand lower back pain.He rates his pain 7.His current exercise regime is walking.  Mr. [name] states his appetite is good his appetite remains good he's not able to gain weight, his Oncologist following.   Last UDS was performed on 06/07/2016, it was consistent.   Pain Inventory Average Pain 7 Pain Right Now 7 My pain is sharp, stabbing and tingling  In the last 24 hours, has pain interfered with the following? General activity 7 Relation with others 7 Enjoyment of life 7 What TIME of day is your pain at its worst? all Sleep (in general) NA  Pain is worse with: walking, sitting and standing Pain improves with: medication and TENS Relief from Meds: 3  Mobility walk without assistance use a cane use a walker ability to climb steps?  yes do you drive?  yes  Function disabled: date disabled 05/2010  Neuro/Psych weakness numbness tremor  Prior Studies Any changes since last visit?  yes  Going to have another PET scan  Physicians involved in your care Any changes since last visit?  no   Family History  Problem Relation Age of Onset  . Anesthesia problems Neg Hx   . Hypotension Neg Hx   . Malignant hyperthermia Neg Hx   . Pseudochol deficiency Neg Hx    Social History   Social History  . Marital status: Single    Spouse name: N/A  . Number of children: N/A  . Years of education: N/A   Social History Main Topics  . Smoking status: Current Every Day Smoker    Packs/day: 1.00    Years: 33.00    Types: Cigarettes  . Smokeless tobacco: Never Used  . Alcohol use No  . Drug use: No  . Sexual activity: No     Comment: back problems   Other  Topics Concern  . None   Social History Narrative  . None   Past Surgical History:  Procedure Laterality Date  . BACK SURGERY  2013   Back surgery  (fusion)09/15/2010 & 2013  . BACK SURGERY  03-2013  . HARDWARE REMOVAL N/A 03/14/2013   Procedure: HARDWARE REMOVAL;  Surgeon: Eustace Moore, MD;  Location: [place] [facility];  Service: Neurosurgery;  Laterality: N/A;   Past Medical History:  Diagnosis Date  . Goals of care, counseling/discussion 04/07/2016  . No pertinent past medical history   . Occupational injury 2013   resulted in back injury that led to surgery  . Thyroid cancer (HCC)    BP 127/70   Pulse 77   SpO2 95%   Opioid Risk Score:  3 Fall Risk Score:  `1  Depression screen PHQ 2/9  Depression screen [facility] 2/9 08/25/2016 06/01/2016 08/26/2015 09/22/2014 08/20/2014 04/21/2014  Decreased Interest 0 0 0 0 2 2  Down, Depressed, Hopeless 0 0 0 0 0 0  PHQ - 2 Score 0 0 0 0 2 2  Altered sleeping - - - - - 3  Tired, decreased energy - - - - - 3  Change in appetite - - - - - 0  Feeling bad or failure about yourself  - - - - - 0  Trouble concentrating - - - - - 2  Moving slowly or fidgety/restless - - - - - 0  Suicidal thoughts - - - - - 0  PHQ-9 Score - - - - - 10    Review of Systems  HENT: Negative.   Eyes: Negative.   Respiratory: Negative.   Cardiovascular: Negative.   Gastrointestinal: Negative.   Endocrine: Negative.   Genitourinary: Negative.   Musculoskeletal: Negative.   Skin: Negative.   Allergic/Immunologic: Negative.   Neurological: Positive for tremors, weakness and numbness.  Hematological: Negative.   Psychiatric/Behavioral: Negative.   All other systems reviewed and are negative.      Objective:   Physical Exam  Constitutional: He is oriented to person, place, and time. He appears well-developed and well-nourished.  HENT:  Head: Normocephalic and atraumatic.  Neck: Normal range of motion. Neck supple.  Cervical Paraspinal Tenderness: C-5-C-6    Cardiovascular: Normal rate and regular rhythm.   Pulmonary/Chest: Effort normal and breath sounds normal.  Musculoskeletal:  Normal Muscle Bulk and Muscle Testing Reveals:  Upper Extremities: Full ROM and Muscle Strength 5/5 Right AC Joint Tenderness Thoracic Paraspinal Tenderness: T-1-T-3 Lumbar Paraspinal Tenderness: L-3-L-5 Lower Extremities: Full ROM and Muscle Strength 5/5 Arises from table with ease  Narrow Based Gait  Neurological: He is alert and oriented to person, place, and time.  Skin: Skin is warm and dry.  Psychiatric: He has a normal mood and affect.  Nursing note and vitals reviewed.         Assessment & Plan:  1. Lumbar postlaminectomy syndrome status post lumbar fusion x3: His most recent surgery was 03/14/2013. He had hardware removal and bone allograft waist and L2-3. 08/25/2016 Refilled: oxyCODONE 7.5/325mg  one tablet every 4 hours as needed #100 and Oxycontin 30 mg one tablet every 12 hours #60.  We will continue the opioid monitoring program, this consists of regular clinic visits, examinations, urine drug screen, pill counts as well as use of [place] Controlled Substance Reporting System. 2. Lumbar Radiculopathy: Continue Lyrica. 08/25/2016 3. Insomnia: ContinueTrazodone.08/25/2016 4. Nausea: Continue Zofran as needed. 08/25/2016  20  minutes of face to face patient care time was spent during this visit. All questions were encouraged and answered.   F/U in 1 month

## 2016-09-02 ENCOUNTER — Encounter (HOSPITAL_COMMUNITY)
Admission: RE | Admit: 2016-09-02 | Discharge: 2016-09-02 | Disposition: A | Discharge status: Home / Self Care | Payer: Medicare Other | Source: Ambulatory Visit | Attending: Hematology & Oncology | Admitting: Hematology & Oncology

## 2016-09-02 DIAGNOSIS — R634 Abnormal weight loss: Secondary | ICD-10-CM | POA: Insufficient documentation

## 2016-09-02 DIAGNOSIS — C090 Malignant neoplasm of tonsillar fossa: Secondary | ICD-10-CM | POA: Diagnosis not present

## 2016-09-02 LAB — GLUCOSE, CAPILLARY: GLUCOSE-CAPILLARY: 90 mg/dL (ref 65–99)

## 2016-09-02 MED ORDER — FLUDEOXYGLUCOSE F - 18 (FDG) INJECTION
5.6000 | Freq: Once | INTRAVENOUS | Status: AC | PRN
  Administered 2016-09-02: 5.6 via INTRAVENOUS

## 2016-09-05 ENCOUNTER — Telehealth: Payer: Self-pay | Admitting: *Deleted

## 2016-09-05 NOTE — Telephone Encounter (Addendum)
Patient is aware of results  ----- Message from [name], MD sent at 09/02/2016  5:01 PM EDT ----- Call - NO problem on the PET scan.  No obvious cancer!!!  Bruce Olson

## 2016-09-07 ENCOUNTER — Other Ambulatory Visit (HOSPITAL_BASED_OUTPATIENT_CLINIC_OR_DEPARTMENT_OTHER): Payer: Medicare Other

## 2016-09-07 ENCOUNTER — Ambulatory Visit (HOSPITAL_BASED_OUTPATIENT_CLINIC_OR_DEPARTMENT_OTHER): Payer: Medicare Other | Admitting: Hematology & Oncology

## 2016-09-07 VITALS — BP 139/62 | HR 59 | Temp 98.2°F | Resp 16 | Wt 116.0 lb

## 2016-09-07 DIAGNOSIS — Z85818 Personal history of malignant neoplasm of other sites of lip, oral cavity, and pharynx: Secondary | ICD-10-CM | POA: Diagnosis not present

## 2016-09-07 DIAGNOSIS — C090 Malignant neoplasm of tonsillar fossa: Secondary | ICD-10-CM

## 2016-09-07 DIAGNOSIS — R634 Abnormal weight loss: Secondary | ICD-10-CM

## 2016-09-07 DIAGNOSIS — Z8719 Personal history of other diseases of the digestive system: Secondary | ICD-10-CM

## 2016-09-07 LAB — CBC WITH DIFFERENTIAL (CANCER CENTER ONLY)
BASO#: 0 10*3/uL (ref 0.0–0.2)
BASO%: 0.2 % (ref 0.0–2.0)
EOS%: 2.1 % (ref 0.0–7.0)
Eosinophils Absolute: 0.1 10*3/uL (ref 0.0–0.5)
HCT: 30.8 % — ABNORMAL LOW (ref 38.7–49.9)
HEMOGLOBIN: 10.7 g/dL — AB (ref 13.0–17.1)
LYMPH#: 1.1 10*3/uL (ref 0.9–3.3)
LYMPH%: 25.5 % (ref 14.0–48.0)
MCH: 30.8 pg (ref 28.0–33.4)
MCHC: 34.7 g/dL (ref 32.0–35.9)
MCV: 89 fL (ref 82–98)
MONO#: 0.6 10*3/uL (ref 0.1–0.9)
MONO%: 14.5 % — AB (ref 0.0–13.0)
NEUT%: 57.7 % (ref 40.0–80.0)
NEUTROS ABS: 2.4 10*3/uL (ref 1.5–6.5)
Platelets: 216 10*3/uL (ref 145–400)
RBC: 3.47 10*6/uL — AB (ref 4.20–5.70)
RDW: 13.2 % (ref 11.1–15.7)
WBC: 4.2 10*3/uL (ref 4.0–10.0)

## 2016-09-07 LAB — CMP (CANCER CENTER ONLY)
ALBUMIN: 3.5 g/dL (ref 3.3–5.5)
ALT(SGPT): 23 U/L (ref 10–47)
AST: 29 U/L (ref 11–38)
Alkaline Phosphatase: 42 U/L (ref 26–84)
BILIRUBIN TOTAL: 0.5 mg/dL (ref 0.20–1.60)
BUN, Bld: 14 mg/dL (ref 7–22)
CO2: 29 meq/L (ref 18–33)
CREATININE: 1.4 mg/dL — AB (ref 0.6–1.2)
Calcium: 9 mg/dL (ref 8.0–10.3)
Chloride: 97 mEq/L — ABNORMAL LOW (ref 98–108)
Glucose, Bld: 98 mg/dL (ref 73–118)
Potassium: 4.5 mEq/L (ref 3.3–4.7)
SODIUM: 134 meq/L (ref 128–145)
Total Protein: 6.7 g/dL (ref 6.4–8.1)

## 2016-09-07 LAB — LACTATE DEHYDROGENASE: LDH: 209 U/L (ref 125–245)

## 2016-09-07 MED ORDER — PANTOPRAZOLE SODIUM 40 MG PO TBEC: 40.0000 mg | DELAYED_RELEASE_TABLET | Freq: Two times a day (BID) | ORAL | 2 refills | Status: DC

## 2016-09-07 MED ORDER — METOCLOPRAMIDE HCL 10 MG PO TABS: 10.0000 mg | ORAL_TABLET | Freq: Three times a day (TID) | ORAL | 2 refills | Status: DC

## 2016-09-07 NOTE — Progress Notes (Signed)
Hematology and Oncology Follow Up Visit  Bruce Olson [id number] [birth year] 61 y.o. 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 He might be doing a little bit better. He has gained weight.  We did go ahead and repeat a PET scan on him. The PET scan did not show any evidence of recurrent head and neck cancer.  His problem now is that he says he feels like he is bathrobe all the time. It sounds like he may have reflux. We will  try him on Protonix and also some Reglan. We will see how this does.  There's been no actual vomiting. He's had no diarrhea.  He has not had any obvious abdominal pain. Has chronic pain in his upper back.  There's been no bleeding. He's had no fever. He's had no cough..  Overall, his performance status is ECOG 2.  Medications:  Current Outpatient Prescriptions:  .  docusate sodium (COLACE) 100 MG capsule, Take 100 mg by mouth daily., Disp: , Rfl:  .  Multiple Vitamin (MULITIVITAMIN WITH MINERALS) TABS, Take 1 tablet by mouth daily., Disp: , Rfl:  .  ondansetron (ZOFRAN ODT) 8 MG disintegrating tablet, Take 1 tablet (8 mg total) by mouth every 8 (eight) hours as needed for nausea or vomiting., Disp: 30 tablet, Rfl: 0 .  oxandrolone (OXANDRIN) 10 MG tablet, Take 1 tablet (10 mg total) by mouth 2 (two) times daily., Disp: 30 tablet, Rfl: 2 .  oxyCODONE (OXYCONTIN) 30 MG 12 hr tablet, Take 30 mg by mouth 2 (two) times daily., Disp: 60 each, Rfl: 0 .  oxyCODONE-acetaminophen (PERCOCET) Bruce.5-325 MG tablet, Take 1 tablet by mouth every 4 (four) hours as needed. May take one extra tablet when pain is sever. No more than 4 a day, Disp: 100 tablet, Rfl: 0 .  pilocarpine (SALAGEN) 5 MG tablet, Take 1 tablet (5 mg total) by mouth 2 (two)  times daily., Disp: 60 tablet, Rfl: 6 .  pregabalin (LYRICA) 100 MG capsule, Take 1 capsule (100 mg total) by mouth 3 (three) times daily., Disp: 90 capsule, Rfl: 5 .  traZODone (DESYREL) 50 MG tablet, Take 1 tablet at bedtime as needed, Disp: 30 tablet, Rfl: 3  Allergies: No Known Allergies  Past Medical History, Surgical history, Social history, and Family History were reviewed and updated.  Review of Systems:  As above  Physical Exam:  weight is 116 lb (52.6 kg). His oral temperature is 98.2 F (36.8 C). His blood pressure is 139/62 and his pulse is 59 (abnormal). His respiration is 16 and oxygen saturation is 99%.   Wt Readings from Last 3 Encounters:  09/07/16 116 lb (52.6 kg)  07/13/16 110 lb (49.9 kg)  07/07/16 108 lb (49 kg)     Head and neck exam shows no ocular or oral lesions. His oral mucosa is somewhat dry. He has no obvious adenopathy in the neck. Thyroid is nonpalpable. Lungs are with decreased breath sounds in both lung fields. Cardiac exam regular rate and rhythm with no murmurs, rubs or bruits. Abdomen is soft. He has the laparotomy scar from his surgery. He has an area of hypopigmentation at the feeding tube site. He says this is from leakage of stomach acid onto his skin. There is no  palpable liver or spleen tip. Extremities shows no clubbing, cyanosis or edema. He has some slight muscle laxity in the upper and lower extremities. Skin exam shows no rashes, ecchymoses or petechia. Neurological exam shows no focal neurological deficits.  Lab Results  Component Value Date   WBC 4.2 09/07/2016   HGB 10.Bruce (L) 09/07/2016   HCT 30.8 (L) 09/07/2016   MCV 89 09/07/2016   PLT 216 09/07/2016     Chemistry      Component Value Date/Time   NA 134 09/07/2016 1023   NA 135 (L) 04/05/2016 1333   K 4.5 09/07/2016 1023   K 4.Bruce 04/05/2016 1333   CL 97 (L) 09/07/2016 1023   CO2 29 09/07/2016 1023   CO2 29 04/05/2016 1333   BUN 14 09/07/2016 1023   BUN 23.6 04/05/2016 1333     CREATININE 1.4 (H) 09/07/2016 1023   CREATININE 1.1 04/05/2016 1333      Component Value Date/Time   CALCIUM 9.0 09/07/2016 1023   CALCIUM 9.2 04/05/2016 1333   ALKPHOS 42 09/07/2016 1023   ALKPHOS 67 04/05/2016 1333   AST 29 09/07/2016 1023   AST 17 04/05/2016 1333   ALT 23 09/07/2016 1023   ALT 10 04/05/2016 1333   BILITOT 0.50 09/07/2016 1023   BILITOT 0.30 04/05/2016 1333         Impression and Plan: Bruce Olson is a Bruce Olson. 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 Given that this is HPV positive, I think that he probably is cured. Given the fact that his head and neck cancer was HPV positive, I think his chances of recurrence probably will be less than 10%.   Unfortunately, he really does not see other doctors.  Hopefully the Reglan and Protonix will help. If not, he will have to be referred to gastroenterology.   I will see him back in 6 weeks. We'll see how he is feeling at that point. Hopefully his weight will continue to go up. I told him to make sure he continues to take the [place].   [name], MD 8/8/201811:27 AM

## 2016-09-27 ENCOUNTER — Encounter: Payer: Medicare Other | Attending: Physical Medicine and Rehabilitation | Admitting: Registered Nurse

## 2016-09-27 ENCOUNTER — Telehealth: Payer: Self-pay | Admitting: Registered Nurse

## 2016-09-27 ENCOUNTER — Encounter: Payer: Self-pay | Admitting: Registered Nurse

## 2016-09-27 VITALS — BP 150/64 | HR 72

## 2016-09-27 DIAGNOSIS — Z5181 Encounter for therapeutic drug level monitoring: Secondary | ICD-10-CM | POA: Diagnosis not present

## 2016-09-27 DIAGNOSIS — M961 Postlaminectomy syndrome, not elsewhere classified: Secondary | ICD-10-CM | POA: Diagnosis not present

## 2016-09-27 DIAGNOSIS — G4700 Insomnia, unspecified: Secondary | ICD-10-CM | POA: Diagnosis not present

## 2016-09-27 DIAGNOSIS — M791 Myalgia: Secondary | ICD-10-CM | POA: Diagnosis not present

## 2016-09-27 DIAGNOSIS — Z79899 Other long term (current) drug therapy: Secondary | ICD-10-CM | POA: Insufficient documentation

## 2016-09-27 DIAGNOSIS — M542 Cervicalgia: Secondary | ICD-10-CM | POA: Diagnosis not present

## 2016-09-27 DIAGNOSIS — Z9889 Other specified postprocedural states: Secondary | ICD-10-CM | POA: Diagnosis not present

## 2016-09-27 DIAGNOSIS — G894 Chronic pain syndrome: Secondary | ICD-10-CM | POA: Diagnosis not present

## 2016-09-27 DIAGNOSIS — M7918 Myalgia, other site: Secondary | ICD-10-CM

## 2016-09-27 DIAGNOSIS — M5416 Radiculopathy, lumbar region: Secondary | ICD-10-CM | POA: Insufficient documentation

## 2016-09-27 MED ORDER — OXYCODONE HCL ER 30 MG PO T12A: 30.0000 mg | EXTENDED_RELEASE_TABLET | Freq: Two times a day (BID) | ORAL | 0 refills | Status: DC

## 2016-09-27 MED ORDER — OXYCODONE-ACETAMINOPHEN 7.5-325 MG PO TABS: 1.0000 | ORAL_TABLET | ORAL | 0 refills | Status: DC | PRN

## 2016-09-27 NOTE — Progress Notes (Signed)
Subjective:    Patient ID: Bruce Olson, male    DOB: 05-01-1955, 61 y.o.   MRN: [id number]  HPI: [name] is a 61 year old male who returns for follow up appointmentfor chronic pain and medication refill. He states his pain is located in his neck radiating into his right shoulder and lower back pain.He rates his pain 8.His current exercise regime is walking.  Bruce Olson states his appetite is good he still hasn't been able to gain weight, his Oncologist is following.   Last UDS was performed on 06/01/2016, it was consistent.   Pain Inventory Average Pain 7 Pain Right Now 8 My pain is sharp, stabbing and tingling  In the last 24 hours, has pain interfered with the following? General activity 7 Relation with others 7 Enjoyment of life 7 What TIME of day is your pain at its worst? all Sleep (in general) Poor  Pain is worse with: walking, sitting and standing Pain improves with: medication and TENS Relief from Meds: 3  Mobility walk without assistance use a cane how many minutes can you walk? 15 ability to climb steps?  yes do you drive?  yes  Function disabled: date disabled 2012  Neuro/Psych weakness numbness tremor trouble walking  Prior Studies Any changes since last visit?  no  Physicians involved in your care Any changes since last visit?  no   Family History  Problem Relation Age of Onset  . Anesthesia problems Neg Hx   . Hypotension Neg Hx   . Malignant hyperthermia Neg Hx   . Pseudochol deficiency Neg Hx    Social History   Social History  . Marital status: Single    Spouse name: N/A  . Number of children: N/A  . Years of education: N/A   Social History Main Topics  . Smoking status: Current Every Day Smoker    Packs/day: 1.00    Years: 33.00    Types: Cigarettes  . Smokeless tobacco: Never Used  . Alcohol use No  . Drug use: No  . Sexual activity: No     Comment: back problems   Other Topics Concern  .  None   Social History Narrative  . None   Past Surgical History:  Procedure Laterality Date  . BACK SURGERY  2013   Back surgery  (fusion)09/15/2010 & 2013  . BACK SURGERY  03-2013  . HARDWARE REMOVAL N/A 03/14/2013   Procedure: HARDWARE REMOVAL;  Surgeon: Eustace Moore, MD;  Location: [place] [facility];  Service: Neurosurgery;  Laterality: N/A;   Past Medical History:  Diagnosis Date  . Goals of care, counseling/discussion 04/07/2016  . No pertinent past medical history   . Occupational injury 2013   resulted in back injury that led to surgery  . Thyroid cancer ([place])    BP (!) 150/64   Pulse 72   SpO2 95%   Opioid Risk Score:  3 Fall Risk Score:  `1  Depression screen PHQ 2/9  Depression screen [facility] 2/9 09/27/2016 08/25/2016 06/01/2016 08/26/2015 09/22/2014 08/20/2014 04/21/2014  Decreased Interest 0 0 0 0 0 2 2  Down, Depressed, Hopeless 0 0 0 0 0 0 0  PHQ - 2 Score 0 0 0 0 0 2 2  Altered sleeping - - - - - - 3  Tired, decreased energy - - - - - - 3  Change in appetite - - - - - - 0  Feeling bad or failure about yourself  - - - - - -  0  Trouble concentrating - - - - - - 2  Moving slowly or fidgety/restless - - - - - - 0  Suicidal thoughts - - - - - - 0  PHQ-9 Score - - - - - - 10     Review of Systems  Constitutional:       Needs to gain weight per onc  HENT: Negative.   Eyes: Negative.   Respiratory: Negative.   Cardiovascular: Negative.   Gastrointestinal: Negative.   Endocrine: Negative.   Genitourinary: Negative.   Musculoskeletal: Positive for gait problem.  Skin: Negative.   Allergic/Immunologic: Negative.   Neurological: Positive for tremors, weakness and numbness.  Hematological: Negative.   Psychiatric/Behavioral: Negative.   All other systems reviewed and are negative.      Objective:   Physical Exam  Constitutional: He is oriented to person, place, and time. He appears well-developed and well-nourished.  HENT:  Head: Normocephalic and atraumatic.  Neck:  Normal range of motion. Neck supple.  Cervical Paraspinal Tenderness: C-5-C-6  Cardiovascular: Normal rate and regular rhythm.   Pulmonary/Chest: Effort normal and breath sounds normal.  Musculoskeletal:  Normal Muscle Bulk and Muscle Testing Reveals: Upper Extremities: Full ROM and Muscle Strength 5/5 Right AC Joint Tenderness Lumbar Hypersensitivity Lower Extremities: Full ROM and Muscle Strength 5/5 Arises from Table with ease Narrow Based Gait  Neurological: He is alert and oriented to person, place, and time.  Skin: Skin is warm and dry.  Psychiatric: He has a normal mood and affect.  Nursing note and vitals reviewed.         Assessment & Plan:  1. Lumbar postlaminectomy syndrome status post lumbar fusion x3: His most recent surgery was 03/14/2013. He had hardware removal and bone allograft waist and L2-3. Continue to Monitor. 09/27/2016 Refilled: oxyCODONE 7.5/325mg  one tablet every 4 hours as needed #100 and Oxycontin 30 mg one tablet every 12 hours #60.  We will continue the opioid monitoring program, this consists of regular clinic visits, examinations, urine drug screen, pill counts as well as use of [place] Controlled Substance Reporting System. 2. Lumbar Radiculopathy: Continue Lyrica. 09/27/2016 3. Insomnia: ContinueTrazodone.09/27/2016 4. Nausea: Continue Zofran as needed. 09/27/2016  20 minutes of face to face patient care time was spent during this visit. All questions were encouraged and answered.  F/U in 1 month

## 2016-09-27 NOTE — Telephone Encounter (Signed)
On 09/27/2016 the  [place] was reviewed no conflict was seen on the [place] with multiple prescribers. Mr. Mcnay has a signed narcotic contract with our office. If there were any discrepancies this would have been reported to his  physician.

## 2016-10-05 ENCOUNTER — Other Ambulatory Visit: Payer: Self-pay | Admitting: *Deleted

## 2016-10-05 DIAGNOSIS — R634 Abnormal weight loss: Secondary | ICD-10-CM

## 2016-10-05 DIAGNOSIS — C090 Malignant neoplasm of tonsillar fossa: Secondary | ICD-10-CM

## 2016-10-05 MED ORDER — OXANDROLONE 10 MG PO TABS: 10.0000 mg | ORAL_TABLET | Freq: Two times a day (BID) | ORAL | 2 refills | Status: DC

## 2016-10-13 ENCOUNTER — Other Ambulatory Visit: Payer: Self-pay | Admitting: *Deleted

## 2016-10-13 DIAGNOSIS — R634 Abnormal weight loss: Secondary | ICD-10-CM

## 2016-10-13 DIAGNOSIS — C090 Malignant neoplasm of tonsillar fossa: Secondary | ICD-10-CM

## 2016-10-13 MED ORDER — OXANDROLONE 10 MG PO TABS: 10.0000 mg | ORAL_TABLET | Freq: Every day | ORAL | 2 refills | Status: DC

## 2016-10-20 ENCOUNTER — Other Ambulatory Visit: Payer: Self-pay | Admitting: *Deleted

## 2016-10-20 ENCOUNTER — Ambulatory Visit (HOSPITAL_BASED_OUTPATIENT_CLINIC_OR_DEPARTMENT_OTHER): Payer: Medicare Other | Admitting: Hematology & Oncology

## 2016-10-20 ENCOUNTER — Other Ambulatory Visit (HOSPITAL_BASED_OUTPATIENT_CLINIC_OR_DEPARTMENT_OTHER): Payer: Medicare Other

## 2016-10-20 VITALS — BP 138/67 | HR 58 | Temp 98.6°F | Resp 19 | Wt 118.0 lb

## 2016-10-20 DIAGNOSIS — Z8719 Personal history of other diseases of the digestive system: Secondary | ICD-10-CM

## 2016-10-20 DIAGNOSIS — D649 Anemia, unspecified: Secondary | ICD-10-CM

## 2016-10-20 DIAGNOSIS — D510 Vitamin B12 deficiency anemia due to intrinsic factor deficiency: Secondary | ICD-10-CM

## 2016-10-20 DIAGNOSIS — D500 Iron deficiency anemia secondary to blood loss (chronic): Secondary | ICD-10-CM

## 2016-10-20 DIAGNOSIS — C090 Malignant neoplasm of tonsillar fossa: Secondary | ICD-10-CM

## 2016-10-20 DIAGNOSIS — Z85818 Personal history of malignant neoplasm of other sites of lip, oral cavity, and pharynx: Secondary | ICD-10-CM

## 2016-10-20 DIAGNOSIS — D508 Other iron deficiency anemias: Secondary | ICD-10-CM

## 2016-10-20 DIAGNOSIS — K909 Intestinal malabsorption, unspecified: Secondary | ICD-10-CM

## 2016-10-20 DIAGNOSIS — K219 Gastro-esophageal reflux disease without esophagitis: Secondary | ICD-10-CM | POA: Diagnosis not present

## 2016-10-20 LAB — CBC WITH DIFFERENTIAL (CANCER CENTER ONLY)
BASO#: 0 10*3/uL (ref 0.0–0.2)
BASO%: 0.2 % (ref 0.0–2.0)
EOS%: 2.1 % (ref 0.0–7.0)
Eosinophils Absolute: 0.1 10*3/uL (ref 0.0–0.5)
HCT: 30.6 % — ABNORMAL LOW (ref 38.7–49.9)
HGB: 10.7 g/dL — ABNORMAL LOW (ref 13.0–17.1)
LYMPH#: 1.2 10*3/uL (ref 0.9–3.3)
LYMPH%: 24.4 % (ref 14.0–48.0)
MCH: 30.7 pg (ref 28.0–33.4)
MCHC: 35 g/dL (ref 32.0–35.9)
MCV: 88 fL (ref 82–98)
MONO#: 0.7 10*3/uL (ref 0.1–0.9)
MONO%: 14.1 % — AB (ref 0.0–13.0)
NEUT%: 59.2 % (ref 40.0–80.0)
NEUTROS ABS: 2.8 10*3/uL (ref 1.5–6.5)
Platelets: 220 10*3/uL (ref 145–400)
RBC: 3.48 10*6/uL — AB (ref 4.20–5.70)
RDW: 12.9 % (ref 11.1–15.7)
WBC: 4.8 10*3/uL (ref 4.0–10.0)

## 2016-10-20 LAB — CMP (CANCER CENTER ONLY)
ALK PHOS: 50 U/L (ref 26–84)
ALT: 19 U/L (ref 10–47)
AST: 27 U/L (ref 11–38)
Albumin: 3.3 g/dL (ref 3.3–5.5)
BILIRUBIN TOTAL: 0.4 mg/dL (ref 0.20–1.60)
BUN, Bld: 16 mg/dL (ref 7–22)
CHLORIDE: 93 meq/L — AB (ref 98–108)
CO2: 32 mEq/L (ref 18–33)
CREATININE: 1.3 mg/dL — AB (ref 0.6–1.2)
Calcium: 9 mg/dL (ref 8.0–10.3)
Glucose, Bld: 86 mg/dL (ref 73–118)
Potassium: 4.9 mEq/L — ABNORMAL HIGH (ref 3.3–4.7)
SODIUM: 132 meq/L (ref 128–145)
Total Protein: 6.4 g/dL (ref 6.4–8.1)

## 2016-10-20 NOTE — Progress Notes (Signed)
Hematology and Oncology Follow Up Visit  Bruce Olson [id number] 06-May-1955 61 y.o. 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 is back for follow-up. He is still having proms with acid reflux. We tried him on Reglan and Protonix. He says this really is not helping.  We will have to make a referral to gastroenterology. I will see if Dr. Watt Olson or Dr. Cristina Olson can see him.  He is on Olson. His weight is doing pretty well. His weight is going up slowly.  He does get tired. He does have some slight desired to chew ice. I will check his iron studies. It would not surprise me if he is iron deficient.  He's had no fever. He's had no obvious bleeding. He's had no problems with bowels or bladder.  Overall, his performance status is ECOG 1  Medications:  Current Outpatient Prescriptions:  .  docusate sodium (COLACE) 100 MG capsule, Take 100 mg by mouth daily., Disp: , Rfl:  .  metoCLOPramide (REGLAN) 10 MG tablet, Take 1 tablet (10 mg total) by mouth 4 (four) times daily -  before meals and at bedtime., Disp: 120 tablet, Rfl: 2 .  Multiple Vitamin (MULITIVITAMIN WITH MINERALS) TABS, Take 1 tablet by mouth daily., Disp: , Rfl:  .  ondansetron (ZOFRAN ODT) 8 MG disintegrating tablet, Take 1 tablet (8 mg total) by mouth every 8 (eight) hours as needed for nausea or vomiting., Disp: 30 tablet, Rfl: 0 .  oxandrolone (Olson) 10 MG tablet, Take 1 tablet (10 mg total) by mouth daily., Disp: 30 tablet, Rfl: 2 .  oxyCODONE (OXYCONTIN) 30 MG 12 hr tablet, Take 30 mg by mouth 2 (two) times daily., Disp: 60 each, Rfl: 0 .  oxyCODONE-acetaminophen (PERCOCET) 7.5-325 MG tablet, Take 1 tablet by mouth every 4 (four) hours as needed. May take one extra tablet when pain is sever. No more than  4 a day, Disp: 100 tablet, Rfl: 0 .  pantoprazole (PROTONIX) 40 MG tablet, Take 1 tablet (40 mg total) by mouth 2 (two) times daily before a meal., Disp: 60 tablet, Rfl: 2 .  pilocarpine (SALAGEN) 5 MG tablet, Take 1 tablet (5 mg total) by mouth 2 (two) times daily., Disp: 60 tablet, Rfl: 6 .  pregabalin (LYRICA) 100 MG capsule, Take 1 capsule (100 mg total) by mouth 3 (three) times daily., Disp: 90 capsule, Rfl: 5 .  traZODone (DESYREL) 50 MG tablet, Take 1 tablet at bedtime as needed, Disp: 30 tablet, Rfl: 3  Allergies: No Known Allergies  Past Medical History, Surgical history, Social history, and Family History were reviewed and updated.  Review of Systems:  As stated in the interim history  Physical Exam:  weight is 118 lb (53.5 kg). His oral temperature is 98.6 F (37 C). His blood pressure is 138/67 and his pulse is 58 (abnormal). His respiration is 19 and oxygen saturation is 100%.   Wt Readings from Last 3 Encounters:  10/20/16 118 lb (53.5 kg)  09/07/16 116 lb (52.6 kg)  07/13/16 110 lb (49.9 kg)     I examined [name] thoroughly. There results of my examination are listed below with appropriate changes:   Head and neck exam shows no ocular or oral lesions. His oral mucosa is somewhat dry. He has  no obvious adenopathy in the neck. Thyroid is nonpalpable. Lungs are with decreased breath sounds in both lung fields. Cardiac exam regular rate and rhythm with no murmurs, rubs or bruits. Abdomen is soft. He has the laparotomy scar from his surgery. He has an area of hypopigmentation at the feeding tube site.  There is no palpable liver or spleen tip. Extremities shows no clubbing, cyanosis or edema. He has some slight muscle laxity in the upper and lower extremities. Skin exam shows no rashes, ecchymoses or petechia. Neurological exam shows no focal neurological deficits.  Lab Results  Component Value Date   WBC 4.8 10/20/2016   HGB 10.7 (L) 10/20/2016   HCT 30.6 (L)  10/20/2016   MCV 88 10/20/2016   PLT 220 10/20/2016     Chemistry      Component Value Date/Time   NA 134 09/07/2016 1023   NA 135 (L) 04/05/2016 1333   K 4.5 09/07/2016 1023   K 4.7 04/05/2016 1333   CL 97 (L) 09/07/2016 1023   CO2 29 09/07/2016 1023   CO2 29 04/05/2016 1333   BUN 14 09/07/2016 1023   BUN 23.6 04/05/2016 1333   CREATININE 1.4 (H) 09/07/2016 1023   CREATININE 1.1 04/05/2016 1333      Component Value Date/Time   CALCIUM 9.0 09/07/2016 1023   CALCIUM 9.2 04/05/2016 1333   ALKPHOS 42 09/07/2016 1023   ALKPHOS 67 04/05/2016 1333   AST 29 09/07/2016 1023   AST 17 04/05/2016 1333   ALT 23 09/07/2016 1023   ALT 10 04/05/2016 1333   BILITOT 0.50 09/07/2016 1023   BILITOT 0.30 04/05/2016 1333         Impression and Plan: Bruce Olson is a 61 year old white male. 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 Given that this is HPV positive, I think that he probably is cured. Given the fact that his head and neck cancer was HPV positive, I think his chances of recurrence probably will be less than 10%.   I will have to refer him to gastroenterology. He does not want to go back to see the doctors at [facility].  His weight is going up slowly but surely. The Olson I think is helping.  I want to check his iron studies. He is a little bit anemic. His MCV is dropping. As such, some of his issues might be from iron deficiency. I think this would be a reasonable thing to look at.   We will see him back in another 6 weeks. Also, he will be able to see gastroenterology.    [name], MD 9/20/201812:04 PM

## 2016-10-21 ENCOUNTER — Telehealth: Payer: Self-pay | Admitting: *Deleted

## 2016-10-21 ENCOUNTER — Encounter: Payer: Self-pay | Admitting: Hematology & Oncology

## 2016-10-21 DIAGNOSIS — K909 Intestinal malabsorption, unspecified: Secondary | ICD-10-CM | POA: Insufficient documentation

## 2016-10-21 DIAGNOSIS — D500 Iron deficiency anemia secondary to blood loss (chronic): Secondary | ICD-10-CM

## 2016-10-21 HISTORY — DX: Iron deficiency anemia secondary to blood loss (chronic): D50.0

## 2016-10-21 HISTORY — DX: Intestinal malabsorption, unspecified: K90.9

## 2016-10-21 LAB — IRON AND TIBC
%SAT: 13 % — AB (ref 20–55)
Iron: 47 ug/dL (ref 42–163)
TIBC: 370 ug/dL (ref 202–409)
UIBC: 323 ug/dL (ref 117–376)

## 2016-10-21 LAB — FERRITIN: FERRITIN: 106 ng/mL (ref 22–316)

## 2016-10-21 NOTE — Addendum Note (Signed)
Addended by: [name] on: 10/21/2016 09:37 AM   Modules accepted: Orders

## 2016-10-21 NOTE — Telephone Encounter (Signed)
-----   Message from [name], MD sent at 10/21/2016  9:35 AM EDT ----- Call - iron is low!!  Need 1 dose of Feraheme.  Please set up for next 1-2 weeks!!  [name]

## 2016-10-24 ENCOUNTER — Ambulatory Visit (HOSPITAL_BASED_OUTPATIENT_CLINIC_OR_DEPARTMENT_OTHER): Payer: Medicare Other | Admitting: Physical Medicine & Rehabilitation

## 2016-10-24 ENCOUNTER — Encounter: Payer: Self-pay | Admitting: Physical Medicine & Rehabilitation

## 2016-10-24 ENCOUNTER — Encounter: Payer: Medicare Other | Attending: Physical Medicine and Rehabilitation

## 2016-10-24 VITALS — BP 150/74 | HR 63 | Resp 14

## 2016-10-24 DIAGNOSIS — M259 Joint disorder, unspecified: Secondary | ICD-10-CM | POA: Diagnosis not present

## 2016-10-24 DIAGNOSIS — Z9889 Other specified postprocedural states: Secondary | ICD-10-CM | POA: Diagnosis not present

## 2016-10-24 DIAGNOSIS — M47812 Spondylosis without myelopathy or radiculopathy, cervical region: Secondary | ICD-10-CM | POA: Diagnosis not present

## 2016-10-24 DIAGNOSIS — M961 Postlaminectomy syndrome, not elsewhere classified: Secondary | ICD-10-CM

## 2016-10-24 DIAGNOSIS — Z79899 Other long term (current) drug therapy: Secondary | ICD-10-CM | POA: Insufficient documentation

## 2016-10-24 DIAGNOSIS — Z5181 Encounter for therapeutic drug level monitoring: Secondary | ICD-10-CM | POA: Insufficient documentation

## 2016-10-24 DIAGNOSIS — M5416 Radiculopathy, lumbar region: Secondary | ICD-10-CM | POA: Diagnosis not present

## 2016-10-24 DIAGNOSIS — M5030 Other cervical disc degeneration, unspecified cervical region: Secondary | ICD-10-CM | POA: Insufficient documentation

## 2016-10-24 DIAGNOSIS — M542 Cervicalgia: Secondary | ICD-10-CM | POA: Insufficient documentation

## 2016-10-24 MED ORDER — OXYCODONE HCL ER 30 MG PO T12A: 30.0000 mg | EXTENDED_RELEASE_TABLET | Freq: Two times a day (BID) | ORAL | 0 refills | Status: DC

## 2016-10-24 MED ORDER — OXYCODONE-ACETAMINOPHEN 7.5-325 MG PO TABS: 1.0000 | ORAL_TABLET | ORAL | 0 refills | Status: DC | PRN

## 2016-10-24 MED ORDER — MIRTAZAPINE 7.5 MG PO TABS: 7.5000 mg | ORAL_TABLET | Freq: Every day | ORAL | 1 refills | Status: DC

## 2016-10-24 NOTE — Patient Instructions (Signed)
May try a safer version of oxycontin next month will need to check your insurance

## 2016-10-24 NOTE — Progress Notes (Signed)
Subjective:    Patient ID: Bruce [facility], male    DOB: 30-Aug-1955, 61 y.o.   MRN: [id number]  HPI No evidence of tonsillar cancer on recent PET scan. Has some esophageal reflux symptoms, which did not respond to medication treatment with Reglan and proton pump inhibitor, has appointment with gastroenterology. Iron deficiency is starting iron infusions  Complaints of poor sleep, about 1 or 2 hours of sleep after trazodone.  Does not have a reason for poor sleep. No pain complaints. No bowel or bladder issues. No coughing. Pain Inventory Average Pain 8 Pain Right Now 8 My pain is sharp, stabbing and tingling  In the last 24 hours, has pain interfered with the following? General activity 8 Relation with others 8 Enjoyment of life 8 What TIME of day is your pain at its worst? all Sleep (in general) Poor  Pain is worse with: walking, standing and some activites Pain improves with: heat/ice, medication and TENS Relief from Meds: 2  Mobility walk without assistance walk with assistance use a cane ability to climb steps?  yes do you drive?  yes  Function disabled: date disabled .  Neuro/Psych weakness numbness tremor trouble walking  Prior Studies Any changes since last visit?  no  Physicians involved in your care Any changes since last visit?  no   Family History  Problem Relation Age of Onset  . Anesthesia problems Neg Hx   . Hypotension Neg Hx   . Malignant hyperthermia Neg Hx   . Pseudochol deficiency Neg Hx    Social History   Social History  . Marital status: Single    Spouse name: N/A  . Number of children: N/A  . Years of education: N/A   Social History Main Topics  . Smoking status: Current Every Day Smoker    Packs/day: 1.00    Years: 33.00    Types: Cigarettes  . Smokeless tobacco: Never Used  . Alcohol use No  . Drug use: No  . Sexual activity: No     Comment: back problems   Other Topics Concern  . None   Social History  Narrative  . None   Past Surgical History:  Procedure Laterality Date  . BACK SURGERY  2013   Back surgery  (fusion)09/15/2010 & 2013  . BACK SURGERY  03-2013  . HARDWARE REMOVAL N/A 03/14/2013   Procedure: HARDWARE REMOVAL;  Surgeon: Eustace Moore, MD;  Location: [facility];  Service: Neurosurgery;  Laterality: N/A;   Past Medical History:  Diagnosis Date  . Goals of care, counseling/discussion 04/07/2016  . Iron deficiency anemia due to chronic blood loss 10/21/2016  . 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 No pertinent past medical history   . Occupational injury 2013   resulted in back injury that led to surgery  . Thyroid cancer (HCC)    BP (!) 150/74 (BP Location: Left Arm, Patient Position: Sitting, Cuff Size: Normal)   Pulse 63   Resp 14   SpO2 96%   Opioid Risk Score:   Fall Risk Score:  `1  Depression screen PHQ 2/9  Depression screen [facility] 2/9 09/27/2016 08/25/2016 06/01/2016 08/26/2015 09/22/2014 08/20/2014 04/21/2014  Decreased Interest 0 0 0 0 0 2 2  Down, Depressed, Hopeless 0 0 0 0 0 0 0  PHQ - 2 Score 0 0 0 0 0 2 2  Altered sleeping - - - - - - 3  Tired, decreased energy - - - - - - 3  Change in appetite - - - - - -  0  Feeling bad or failure about yourself  - - - - - - 0  Trouble concentrating - - - - - - 2  Moving slowly or fidgety/restless - - - - - - 0  Suicidal thoughts - - - - - - 0  PHQ-9 Score - - - - - - 10    Review of Systems  HENT: Negative.   Eyes: Negative.   Respiratory: Negative.   Cardiovascular: Negative.   Gastrointestinal: Negative.   Endocrine: Negative.   Genitourinary: Negative.   Musculoskeletal: Positive for arthralgias, back pain and gait problem.  Skin: Negative.   Allergic/Immunologic: Negative.   Neurological: Positive for tremors, weakness and numbness.  Hematological: Negative.   Psychiatric/Behavioral: Negative.        Objective:   Physical Exam  Constitutional: No distress.  Thin male appearing older than his stated age    HENT:  Head: Normocephalic and atraumatic.  Eyes: Pupils are equal, round, and reactive to light. Conjunctivae and EOM are normal. Right eye exhibits no discharge. Left eye exhibits no discharge. No scleral icterus.  Neck: No JVD present.  Reduced cervical range of motion toward the right  Pulmonary/Chest: No stridor.   Cervical range of motion right rotation 50%, left rotation 100%, flexion 100%, extension 50%       Assessment & Plan:  1.  Cervical pain, multifactorial. He does have cervical degenerative disc as well as cervical facet arthropathy.  Patient states that trigger point injections were of very temporary benefit, perhaps one hour   Scheduled for cervical medial branch blocks if above is not helpful. No new neurologic signs, no need for cervical MRI   . 4. 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 L2-S1 fusion, has pain sacroiliac distribution, and has had good relief with sacroiliac injection in the past. We will repeat. Moderate to high dose opioid treatment with combination long-acting short acting. Continue OxyContin 30 mg twice a day- consider change to Xtampza Continue Percocet 7.5 milligrams 4 times a day Continue Lyrica 100 mg 3 times a day   Continue opioid monitoring program. This consists of regular clinic visits, examinations, urine drug screen, pill counts as well as use of [place] controlled substance reporting System. PMP score 310. Discussed this in terms of opioid overdose risk  The [place] control substances reporting system was reviewed. There currently is no evidence of multiple prescribers for opiates and other controlled substances. Patients on chronic opioids will have this reviewed at minimum every 3 months. Follow-up one month, nurse practitioner

## 2016-10-26 ENCOUNTER — Other Ambulatory Visit: Payer: Self-pay | Admitting: Gastroenterology

## 2016-10-26 DIAGNOSIS — K2100 Gastro-esophageal reflux disease with esophagitis, without bleeding: Secondary | ICD-10-CM

## 2016-10-26 DIAGNOSIS — R634 Abnormal weight loss: Secondary | ICD-10-CM | POA: Diagnosis not present

## 2016-10-26 DIAGNOSIS — D500 Iron deficiency anemia secondary to blood loss (chronic): Secondary | ICD-10-CM | POA: Diagnosis not present

## 2016-10-27 ENCOUNTER — Ambulatory Visit (HOSPITAL_BASED_OUTPATIENT_CLINIC_OR_DEPARTMENT_OTHER): Payer: Medicare Other

## 2016-10-27 VITALS — BP 140/57 | HR 63 | Temp 97.8°F | Resp 17

## 2016-10-27 DIAGNOSIS — K922 Gastrointestinal hemorrhage, unspecified: Secondary | ICD-10-CM

## 2016-10-27 DIAGNOSIS — K909 Intestinal malabsorption, unspecified: Secondary | ICD-10-CM

## 2016-10-27 DIAGNOSIS — D500 Iron deficiency anemia secondary to blood loss (chronic): Secondary | ICD-10-CM

## 2016-10-27 MED ORDER — FERUMOXYTOL INJECTION 510 MG/17 ML
510.0000 mg | Freq: Once | INTRAVENOUS | Status: AC
  Administered 2016-10-27: 510 mg via INTRAVENOUS
  Filled 2016-10-27: qty 17

## 2016-10-27 MED ORDER — SODIUM CHLORIDE 0.9 % IV SOLN
Freq: Once | INTRAVENOUS | Status: AC
  Administered 2016-10-27: 11:00:00 via INTRAVENOUS

## 2016-10-27 NOTE — Patient Instructions (Signed)

## 2016-10-28 ENCOUNTER — Ambulatory Visit
Admission: RE | Admit: 2016-10-28 | Discharge: 2016-10-28 | Disposition: A | Discharge status: Home / Self Care | Payer: Medicare Other | Source: Ambulatory Visit | Attending: Gastroenterology | Admitting: Gastroenterology

## 2016-10-28 DIAGNOSIS — K2100 Gastro-esophageal reflux disease with esophagitis, without bleeding: Secondary | ICD-10-CM

## 2016-10-28 DIAGNOSIS — R634 Abnormal weight loss: Secondary | ICD-10-CM | POA: Diagnosis not present

## 2016-11-21 ENCOUNTER — Encounter: Payer: Medicare Other | Attending: Physical Medicine and Rehabilitation

## 2016-11-21 ENCOUNTER — Other Ambulatory Visit: Payer: Self-pay | Admitting: *Deleted

## 2016-11-21 ENCOUNTER — Encounter: Payer: Self-pay | Admitting: Physical Medicine & Rehabilitation

## 2016-11-21 ENCOUNTER — Ambulatory Visit (HOSPITAL_BASED_OUTPATIENT_CLINIC_OR_DEPARTMENT_OTHER): Payer: Medicare Other | Admitting: Physical Medicine & Rehabilitation

## 2016-11-21 VITALS — BP 166/79 | HR 65

## 2016-11-21 DIAGNOSIS — Z5181 Encounter for therapeutic drug level monitoring: Secondary | ICD-10-CM | POA: Insufficient documentation

## 2016-11-21 DIAGNOSIS — M533 Sacrococcygeal disorders, not elsewhere classified: Secondary | ICD-10-CM

## 2016-11-21 DIAGNOSIS — Z79899 Other long term (current) drug therapy: Secondary | ICD-10-CM | POA: Insufficient documentation

## 2016-11-21 DIAGNOSIS — Z9889 Other specified postprocedural states: Secondary | ICD-10-CM | POA: Diagnosis not present

## 2016-11-21 DIAGNOSIS — M542 Cervicalgia: Secondary | ICD-10-CM | POA: Insufficient documentation

## 2016-11-21 DIAGNOSIS — M5416 Radiculopathy, lumbar region: Secondary | ICD-10-CM | POA: Insufficient documentation

## 2016-11-21 DIAGNOSIS — G894 Chronic pain syndrome: Secondary | ICD-10-CM

## 2016-11-21 DIAGNOSIS — M961 Postlaminectomy syndrome, not elsewhere classified: Secondary | ICD-10-CM | POA: Diagnosis not present

## 2016-11-21 DIAGNOSIS — Z8719 Personal history of other diseases of the digestive system: Secondary | ICD-10-CM

## 2016-11-21 MED ORDER — OXYCODONE HCL ER 30 MG PO T12A: 30.0000 mg | EXTENDED_RELEASE_TABLET | Freq: Two times a day (BID) | ORAL | 0 refills | Status: DC

## 2016-11-21 MED ORDER — METOCLOPRAMIDE HCL 10 MG PO TABS: 10.0000 mg | ORAL_TABLET | Freq: Three times a day (TID) | ORAL | 2 refills | Status: DC

## 2016-11-21 MED ORDER — OXYCODONE-ACETAMINOPHEN 7.5-325 MG PO TABS: 1.0000 | ORAL_TABLET | ORAL | 0 refills | Status: DC | PRN

## 2016-11-21 NOTE — Progress Notes (Signed)

## 2016-11-21 NOTE — Progress Notes (Signed)
  [facility]   Name: [facility] DOB:13-May-1955 MRN: [id number]  Date:11/21/2016  Physician: Alysia Penna, MD    Nurse/CMA: Wessling CMA  Allergies: No Known Allergies  Consent Signed: Yes.    Is patient diabetic? No.  CBG today?   Pregnant: No. LMP: No LMP for male patient. (age 61-55)  Anticoagulants: no Anti-inflammatory: no Antibiotics: no  Procedure: Left sacroiliac steroid injection Position: Prone Start Time: 1:30pm      End Time: 1:36pm  Fluoro Time: 20  RN/CMA Sports administrator CMA    Time 1235 1:39pm    BP 166/79 180/79    Pulse 65 64    Respirations 14 14    O2 Sat 97 96    S/S 6 6    Pain Level 8/10 8/10     D/C home with sister, [name] & O X 3, D/C instructions reviewed, and sits independently.

## 2016-11-21 NOTE — Patient Instructions (Signed)
Sacroiliac injection was performed today. A combination of a naming medicine plus a cortisone medicine was injected. The injection was done under x-ray guidance. This procedure has been performed to help reduce low back and buttocks pain as well as potentially hip pain. The duration of this injection is variable lasting from hours to  Months. It may repeated if needed. 

## 2016-11-24 ENCOUNTER — Other Ambulatory Visit: Payer: Self-pay

## 2016-11-24 ENCOUNTER — Ambulatory Visit (HOSPITAL_BASED_OUTPATIENT_CLINIC_OR_DEPARTMENT_OTHER): Payer: Medicare Other | Admitting: Family

## 2016-11-24 ENCOUNTER — Other Ambulatory Visit (HOSPITAL_BASED_OUTPATIENT_CLINIC_OR_DEPARTMENT_OTHER): Payer: Medicare Other

## 2016-11-24 ENCOUNTER — Ambulatory Visit: Payer: Self-pay | Admitting: Hematology & Oncology

## 2016-11-24 ENCOUNTER — Ambulatory Visit (HOSPITAL_BASED_OUTPATIENT_CLINIC_OR_DEPARTMENT_OTHER)
Admission: RE | Admit: 2016-11-24 | Discharge: 2016-11-24 | Disposition: A | Discharge status: Home / Self Care | Payer: Medicare Other | Source: Ambulatory Visit | Attending: Family | Admitting: Family

## 2016-11-24 VITALS — BP 151/72 | HR 63 | Temp 97.8°F | Resp 20 | Wt 119.5 lb

## 2016-11-24 DIAGNOSIS — R093 Abnormal sputum: Secondary | ICD-10-CM

## 2016-11-24 DIAGNOSIS — R0602 Shortness of breath: Secondary | ICD-10-CM

## 2016-11-24 DIAGNOSIS — R05 Cough: Secondary | ICD-10-CM

## 2016-11-24 DIAGNOSIS — D510 Vitamin B12 deficiency anemia due to intrinsic factor deficiency: Secondary | ICD-10-CM | POA: Diagnosis not present

## 2016-11-24 DIAGNOSIS — C090 Malignant neoplasm of tonsillar fossa: Secondary | ICD-10-CM | POA: Diagnosis not present

## 2016-11-24 DIAGNOSIS — K219 Gastro-esophageal reflux disease without esophagitis: Secondary | ICD-10-CM

## 2016-11-24 DIAGNOSIS — R058 Other specified cough: Secondary | ICD-10-CM

## 2016-11-24 DIAGNOSIS — D500 Iron deficiency anemia secondary to blood loss (chronic): Secondary | ICD-10-CM

## 2016-11-24 DIAGNOSIS — D508 Other iron deficiency anemias: Secondary | ICD-10-CM

## 2016-11-24 DIAGNOSIS — Z85118 Personal history of other malignant neoplasm of bronchus and lung: Secondary | ICD-10-CM

## 2016-11-24 DIAGNOSIS — K922 Gastrointestinal hemorrhage, unspecified: Secondary | ICD-10-CM | POA: Diagnosis not present

## 2016-11-24 LAB — CBC WITH DIFFERENTIAL (CANCER CENTER ONLY)
BASO#: 0 10*3/uL (ref 0.0–0.2)
BASO%: 0.2 % (ref 0.0–2.0)
EOS ABS: 0.1 10*3/uL (ref 0.0–0.5)
EOS%: 2.2 % (ref 0.0–7.0)
HCT: 33.2 % — ABNORMAL LOW (ref 38.7–49.9)
HEMOGLOBIN: 11.6 g/dL — AB (ref 13.0–17.1)
LYMPH#: 1.3 10*3/uL (ref 0.9–3.3)
LYMPH%: 25 % (ref 14.0–48.0)
MCH: 30.7 pg (ref 28.0–33.4)
MCHC: 34.9 g/dL (ref 32.0–35.9)
MCV: 88 fL (ref 82–98)
MONO#: 1 10*3/uL — AB (ref 0.1–0.9)
MONO%: 19.9 % — ABNORMAL HIGH (ref 0.0–13.0)
NEUT#: 2.7 10*3/uL (ref 1.5–6.5)
NEUT%: 52.7 % (ref 40.0–80.0)
PLATELETS: 256 10*3/uL (ref 145–400)
RBC: 3.78 10*6/uL — AB (ref 4.20–5.70)
RDW: 13.9 % (ref 11.1–15.7)
WBC: 5 10*3/uL (ref 4.0–10.0)

## 2016-11-24 LAB — IRON AND TIBC
%SAT: 26 % (ref 20–55)
IRON: 95 ug/dL (ref 42–163)
TIBC: 364 ug/dL (ref 202–409)
UIBC: 269 ug/dL (ref 117–376)

## 2016-11-24 LAB — CMP (CANCER CENTER ONLY)
ALT(SGPT): 28 U/L (ref 10–47)
AST: 28 U/L (ref 11–38)
Albumin: 3.8 g/dL (ref 3.3–5.5)
Alkaline Phosphatase: 49 U/L (ref 26–84)
BUN, Bld: 15 mg/dL (ref 7–22)
CHLORIDE: 95 meq/L — AB (ref 98–108)
CO2: 32 meq/L (ref 18–33)
Calcium: 9.7 mg/dL (ref 8.0–10.3)
Creat: 1.3 mg/dl — ABNORMAL HIGH (ref 0.6–1.2)
Glucose, Bld: 88 mg/dL (ref 73–118)
POTASSIUM: 4.3 meq/L (ref 3.3–4.7)
Sodium: 137 mEq/L (ref 128–145)
TOTAL PROTEIN: 7.5 g/dL (ref 6.4–8.1)
Total Bilirubin: 0.4 mg/dl (ref 0.20–1.60)

## 2016-11-24 LAB — FERRITIN: FERRITIN: 380 ng/mL — AB (ref 22–316)

## 2016-11-24 NOTE — Progress Notes (Signed)
Hematology and Oncology Follow Up Visit  Bruce [facility] [id number] 10-Oct-1955 61 y.o. 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 He is a persistent chest cold with productive cough for the last month or so. Lung sounds are course throughout. Sputum is green. He states that he is SOB with or without exertion.  He is still smoking 1 ppd.  He denies fever, chills, rash, dizziness, chest pain, palpitations, abdominal pain or changes in bowel or bladder habits.  He has had some nausea and vomiting due to GERD. He states that he is seeing GI.  He received IV iron in September and he states that he can not tell much of a difference. His Hgb is up to 11.6 with an MCV of 88.  No swelling, tenderness, numbness or tingling in his extremities. He wears compression stockings daily.  He is having 3-4 Boost a day. He states that his appetite is good and he is staying hydrated. His weight is up 1 lb since his last visit.   ECOG Performance Status: 1 - Symptomatic but completely ambulatory  Medications:  Allergies as of 11/24/2016   No Known Allergies     Medication List       Accurate as of 11/24/16 12:15 PM. Always use your most recent med list.          docusate sodium 100 MG capsule Commonly known as:  COLACE Take 100 mg by mouth daily.   metoCLOPramide 10 MG tablet Commonly known as:  REGLAN Take 1 tablet (10 mg total) by mouth 4 (four) times daily -  before meals and at bedtime.   mirtazapine 7.5 MG tablet Commonly known as:  REMERON Take 1 tablet (7.5 mg total) by mouth at bedtime.   multivitamin with minerals Tabs tablet Take 1 tablet by mouth daily.   ondansetron 8 MG disintegrating tablet Commonly known as:  ZOFRAN ODT Take 1 tablet  (8 mg total) by mouth every 8 (eight) hours as needed for nausea or vomiting.   oxandrolone 10 MG tablet Commonly known as:  OXANDRIN Take 1 tablet (10 mg total) by mouth daily.   oxyCODONE 30 MG 12 hr tablet Commonly known as:  OXYCONTIN Take 1 tablet (30 mg total) by mouth 2 (two) times daily.   oxyCODONE-acetaminophen 7.5-325 MG tablet Commonly known as:  PERCOCET Take 1 tablet by mouth every 4 (four) hours as needed. May take one extra tablet when pain is sever. No more than 4 a day   pantoprazole 40 MG tablet Commonly known as:  PROTONIX Take 1 tablet (40 mg total) by mouth 2 (two) times daily before a meal.   pilocarpine 5 MG tablet Commonly known as:  SALAGEN Take 1 tablet (5 mg total) by mouth 2 (two) times daily.   pregabalin 100 MG capsule Commonly known as:  LYRICA Take 1 capsule (100 mg total) by mouth 3 (three) times daily.       Allergies: No Known Allergies  Past Medical History, Surgical history, Social history, and Family History were reviewed and updated.  Review of Systems: All other 10 point review of systems is negative.   Physical Exam:  weight is 119 lb 8 oz (54.2 kg). His oral temperature is 97.8 F (36.6 C). His blood pressure is  151/72 (abnormal) and his pulse is 63. His respiration is 20 and oxygen saturation is 98%.   Wt Readings from Last 3 Encounters:  11/24/16 119 lb 8 oz (54.2 kg)  10/20/16 118 lb (53.5 kg)  09/07/16 116 lb (52.6 kg)    Ocular: Sclerae unicteric, pupils equal, round and reactive to light Ear-nose-throat: Oropharynx clear, dentition fair Lymphatic: No cervical, supraclavicular or axillary adenopathy Lungs no rales or rhonchi, good excursion bilaterally Heart regular rate and rhythm, no murmur appreciated Abd soft, nontender, positive bowel sounds, no liver or spleen tip palpated on exam, no fluid wave  MSK no focal spinal tenderness, no joint edema Neuro: non-focal, well-oriented, appropriate affect Breasts:  Deferred   Lab Results  Component Value Date   WBC 5.0 11/24/2016   HGB 11.6 (L) 11/24/2016   HCT 33.2 (L) 11/24/2016   MCV 88 11/24/2016   PLT 256 11/24/2016   Lab Results  Component Value Date   FERRITIN 106 10/20/2016   IRON 47 10/20/2016   TIBC 370 10/20/2016   UIBC 323 10/20/2016   IRONPCTSAT 13 (L) 10/20/2016   Lab Results  Component Value Date   RBC 3.78 (L) 11/24/2016   No results found for: KPAFRELGTCHN, LAMBDASER, KAPLAMBRATIO No results found for: IGGSERUM, IGA, IGMSERUM No results found for: Odetta Pink, SPEI   Chemistry      Component Value Date/Time   NA 137 11/24/2016 1140   NA 135 (L) 04/05/2016 1333   K 4.3 11/24/2016 1140   K 4.7 04/05/2016 1333   CL 95 (L) 11/24/2016 1140   CO2 32 11/24/2016 1140   CO2 29 04/05/2016 1333   BUN 15 11/24/2016 1140   BUN 23.6 04/05/2016 1333   CREATININE 1.3 (H) 11/24/2016 1140   CREATININE 1.1 04/05/2016 1333      Component Value Date/Time   CALCIUM 9.7 11/24/2016 1140   CALCIUM 9.2 04/05/2016 1333   ALKPHOS 49 11/24/2016 1140   ALKPHOS 67 04/05/2016 1333   AST 28 11/24/2016 1140   AST 17 04/05/2016 1333   ALT 28 11/24/2016 1140   ALT 10 04/05/2016 1333   BILITOT 0.40 11/24/2016 1140   BILITOT 0.30 04/05/2016 1333      Impression and Plan: Bruce Olson is a very pleasant 61 yo caucasian gentleman with history of squamous cell carcinoma of the left tonsil, HPV positive. He is here today for routine follow-up after receiving IV iron and also seems to have a persistent chest cold.  We will see what his iron studies show and bring him back in next week for infusion if needed.  He will go down now and have a chest xray and we will put him on an antibiotic if necessary.  We will plan to see him back again in 1 month for follow-up.  He will contact our office with any questions or concerns. WE can certainly see him sooner if need be.   Eliezer Bottom,  NP 10/25/201812:15 PM

## 2016-11-25 ENCOUNTER — Telehealth: Payer: Self-pay

## 2016-11-25 LAB — VITAMIN B12: VITAMIN B 12: 772 pg/mL (ref 232–1245)

## 2016-11-25 NOTE — Telephone Encounter (Addendum)
-----   Message from Eliezer Bottom, NP sent at 11/24/2016  2:35 PM EDT ----- No infection, try an OTC cough medicine. Thank you!  Sarah  Above message given to patient via phone. dph

## 2016-11-27 LAB — DRUG TOX MONITOR 1 W/CONF, ORAL FLD
Amphetamines: NEGATIVE ng/mL (ref <10)
Barbiturates: NEGATIVE ng/mL (ref <10)
Benzodiazepines: NEGATIVE ng/mL (ref <0.50)
Buprenorphine: NEGATIVE ng/mL (ref <0.10)
COTININE: 23.3 ng/mL — AB (ref <5.0)
Cocaine: NEGATIVE ng/mL (ref <5.0)
Codeine: NEGATIVE ng/mL (ref <2.5)
DIHYDROCODEINE: NEGATIVE ng/mL (ref <2.5)
FENTANYL: NEGATIVE ng/mL (ref <0.10)
Heroin Metabolite: NEGATIVE ng/mL (ref <1.0)
Hydrocodone: NEGATIVE ng/mL (ref <2.5)
Hydromorphone: NEGATIVE ng/mL (ref <2.5)
MARIJUANA: NEGATIVE ng/mL (ref <2.5)
MDMA: NEGATIVE ng/mL (ref <10)
MEPROBAMATE: NEGATIVE ng/mL (ref <2.5)
Methadone: NEGATIVE ng/mL (ref <5.0)
Morphine: NEGATIVE ng/mL (ref <2.5)
NORHYDROCODONE: NEGATIVE ng/mL (ref <2.5)
Nicotine Metabolite: POSITIVE ng/mL — AB (ref <5.0)
Noroxycodone: 31.9 ng/mL — ABNORMAL HIGH (ref <2.5)
OPIATES: POSITIVE ng/mL — AB (ref <2.5)
OXYMORPHONE: NEGATIVE ng/mL (ref <2.5)
Oxycodone: 135.9 ng/mL — ABNORMAL HIGH (ref <2.5)
PHENCYCLIDINE: NEGATIVE ng/mL (ref <10)
Tapentadol: NEGATIVE ng/mL (ref <5.0)
Tramadol: NEGATIVE ng/mL (ref <5.0)
ZOLPIDEM: NEGATIVE ng/mL (ref <5.0)

## 2016-11-27 LAB — DRUG TOX ALC METAB W/CON, ORAL FLD: Alcohol Metabolite: NEGATIVE ng/mL (ref <25)

## 2016-12-14 DIAGNOSIS — R634 Abnormal weight loss: Secondary | ICD-10-CM | POA: Diagnosis not present

## 2016-12-14 DIAGNOSIS — D500 Iron deficiency anemia secondary to blood loss (chronic): Secondary | ICD-10-CM | POA: Diagnosis not present

## 2016-12-19 ENCOUNTER — Encounter: Payer: Self-pay | Admitting: Registered Nurse

## 2016-12-19 ENCOUNTER — Telehealth: Payer: Self-pay | Admitting: Registered Nurse

## 2016-12-19 ENCOUNTER — Encounter: Payer: Medicare Other | Attending: Physical Medicine and Rehabilitation | Admitting: Registered Nurse

## 2016-12-19 ENCOUNTER — Other Ambulatory Visit: Payer: Self-pay

## 2016-12-19 VITALS — BP 142/73 | HR 74 | Wt 118.0 lb

## 2016-12-19 DIAGNOSIS — G894 Chronic pain syndrome: Secondary | ICD-10-CM | POA: Diagnosis not present

## 2016-12-19 DIAGNOSIS — M542 Cervicalgia: Secondary | ICD-10-CM | POA: Diagnosis not present

## 2016-12-19 DIAGNOSIS — M5030 Other cervical disc degeneration, unspecified cervical region: Secondary | ICD-10-CM

## 2016-12-19 DIAGNOSIS — M5416 Radiculopathy, lumbar region: Secondary | ICD-10-CM | POA: Diagnosis not present

## 2016-12-19 DIAGNOSIS — Z79899 Other long term (current) drug therapy: Secondary | ICD-10-CM | POA: Insufficient documentation

## 2016-12-19 DIAGNOSIS — M47812 Spondylosis without myelopathy or radiculopathy, cervical region: Secondary | ICD-10-CM

## 2016-12-19 DIAGNOSIS — M961 Postlaminectomy syndrome, not elsewhere classified: Secondary | ICD-10-CM | POA: Diagnosis not present

## 2016-12-19 DIAGNOSIS — Z5181 Encounter for therapeutic drug level monitoring: Secondary | ICD-10-CM | POA: Diagnosis not present

## 2016-12-19 DIAGNOSIS — M7918 Myalgia, other site: Secondary | ICD-10-CM | POA: Diagnosis not present

## 2016-12-19 DIAGNOSIS — Z9889 Other specified postprocedural states: Secondary | ICD-10-CM | POA: Diagnosis not present

## 2016-12-19 MED ORDER — OXYCODONE HCL ER 30 MG PO T12A: 30.0000 mg | EXTENDED_RELEASE_TABLET | Freq: Two times a day (BID) | ORAL | 0 refills | Status: DC

## 2016-12-19 MED ORDER — OXYCODONE-ACETAMINOPHEN 7.5-325 MG PO TABS: 1.0000 | ORAL_TABLET | ORAL | 0 refills | Status: DC | PRN

## 2016-12-19 NOTE — Progress Notes (Signed)
Subjective:    Patient ID: Bruce Olson, male    DOB: 14-Dec-1955, 61 y.o.   MRN: [id number]  HPI: [name] is a 61 year old male who returns for follow up appointmentfor chronic pain and medication refill. He states his pain is located in his neck radiating into his right shoulder and upper back mainly right side and lower back pain.He rates his pain 7. His current exercise regime is walking.  S/P Bilateral Sacroiliac Injection reports two days of relief.   Bruce Olson Morphine equivalent is 135.00 MME.  Bruce Olson reports his appetite is good he's drinking his nourishment shakes, he's lost 1.5 lb this month, his Oncologist is following.   Last UDS was performed on 11/21/2016, it was consistent.   Pain Inventory Average Pain 7 Pain Right Now 7 My pain is sharp, stabbing and tingling  In the last 24 hours, has pain interfered with the following? General activity 7 Relation with others 7 Enjoyment of life 7 What TIME of day is your pain at its worst? all Sleep (in general) Poor  Pain is worse with: walking, sitting and standing Pain improves with: medication and TENS Relief from Meds: 3  Mobility walk without assistance use a cane how many minutes can you walk? 15 ability to climb steps?  yes do you drive?  yes  Function disabled: date disabled 2012  Neuro/Psych weakness numbness tremor trouble walking  Prior Studies Any changes since last visit?  no  Physicians involved in your care Any changes since last visit?  no   Family History  Problem Relation Age of Onset  . Anesthesia problems Neg Hx   . Hypotension Neg Hx   . Malignant hyperthermia Neg Hx   . Pseudochol deficiency Neg Hx    Social History   Socioeconomic History  . Marital status: Single    Spouse name: Not on file  . Number of children: Not on file  . Years of education: Not on file  . Highest education level: Not on file  Social Needs  . Financial  resource strain: Not on file  . Food insecurity - worry: Not on file  . Food insecurity - inability: Not on file  . Transportation needs - medical: Not on file  . Transportation needs - non-medical: Not on file  Occupational History  . Not on file  Tobacco Use  . Smoking status: Current Every Day Smoker    Packs/day: 1.00    Years: 33.00    Pack years: 33.00    Types: Cigarettes  . Smokeless tobacco: Never Used  Substance and Sexual Activity  . Alcohol use: No  . Drug use: No  . Sexual activity: No    Comment: back problems  Other Topics Concern  . Not on file  Social History Narrative  . Not on file   Past Surgical History:  Procedure Laterality Date  . BACK SURGERY  2013   Back surgery  (fusion)09/15/2010 & 2013  . BACK SURGERY  03-2013  . HARDWARE REMOVAL N/A 03/14/2013   Performed by Eustace Moore, MD at [facility] NEURO ORS  . POSTERIOR LUMBAR FUSION 1 LEVEL N/A 04/27/2011   Performed by Eustace Moore, MD at [facility] NEURO ORS   Past Medical History:  Diagnosis Date  . Goals of care, counseling/discussion 04/07/2016  . Iron deficiency anemia due to chronic blood loss 10/21/2016  . 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 No pertinent past medical history   . Occupational injury  2013   resulted in back injury that led to surgery  . Thyroid cancer ([place])    There were no vitals taken for this visit.  Opioid Risk Score:  3 Fall Risk Score:  `1  Depression screen PHQ 2/9  Depression screen [facility] 2/9 11/21/2016 09/27/2016 08/25/2016 06/01/2016 08/26/2015 09/22/2014 08/20/2014  Decreased Interest 0 0 0 0 0 0 2  Down, Depressed, Hopeless 0 0 0 0 0 0 0  PHQ - 2 Score 0 0 0 0 0 0 2  Altered sleeping - - - - - - -  Tired, decreased energy - - - - - - -  Change in appetite - - - - - - -  Feeling bad or failure about yourself  - - - - - - -  Trouble concentrating - - - - - - -  Moving slowly or fidgety/restless - - - - - - -  Suicidal thoughts - - - - - - -  PHQ-9 Score - - - - - - -     Review  of Systems  Constitutional:       Needs to gain weight per onc  HENT: Negative.   Eyes: Negative.   Respiratory: Negative.   Cardiovascular: Negative.   Gastrointestinal: Negative.   Endocrine: Negative.   Genitourinary: Negative.   Musculoskeletal: Positive for gait problem.  Skin: Negative.   Allergic/Immunologic: Negative.   Neurological: Positive for tremors, weakness and numbness.  Hematological: Negative.   Psychiatric/Behavioral: Negative.   All other systems reviewed and are negative.      Objective:   Physical Exam  Constitutional: He is oriented to person, place, and time. He appears well-developed and well-nourished.  HENT:  Head: Normocephalic and atraumatic.  Neck: Normal range of motion. Neck supple.  Cervical Paraspinal Tenderness: C-5-C-6  Mainly Right Side  Cardiovascular: Normal rate and regular rhythm.  Pulmonary/Chest: Effort normal and breath sounds normal.  Musculoskeletal:  Normal Muscle Bulk and Muscle Testing Reveals: Upper Extremities: Full ROM and Muscle Strength 5/5 Right AC Joint Tenderness Thoracic Hypersensitivity: T-1-T-4 Lumbar Hypersensitivity Lower Extremities: Full ROM and Muscle Strength 5/5 Arises from Table with ease Narrow Based Gait  Neurological: He is alert and oriented to person, place, and time.  Skin: Skin is warm and dry.  Psychiatric: He has a normal mood and affect.  Nursing note and vitals reviewed.         Assessment & Plan:  1. Lumbar postlaminectomy syndrome status post lumbar fusion x3: His most recent surgery was 03/14/2013. He had hardware removal and bone allograft waist and L2-3. Continue to Monitor. 12/19/2016 Refilled: oxyCODONE 7.5/325mg  one tablet every 4 hours as needed #100 and Oxycontin 30 mg one tablet every 12 hours #60.  We will continue the opioid monitoring program, this consists of regular clinic visits, examinations, urine drug screen, pill counts as well as use of [place] Controlled  Substance Reporting System. 2. Lumbar Radiculopathy: Continue Lyrica. 12/19/2016 3. Insomnia: ContinueTrazodone.12/19/2016 4. Nausea: Continue Zofran as needed. 12/19/2016  20 minutes of face to face patient care time was spent during this visit. All questions were encouraged and answered.  F/U in 1 month

## 2016-12-19 NOTE — Telephone Encounter (Signed)
On 12/19/2016 the  [place] was reviewed no conflict was seen on the [place] with multiple prescribers. Mr. Sorbo  has a signed narcotic contract with our office. If there were any discrepancies this would have been reported to his physician.

## 2016-12-20 ENCOUNTER — Other Ambulatory Visit: Payer: Self-pay | Admitting: *Deleted

## 2016-12-20 DIAGNOSIS — Z8719 Personal history of other diseases of the digestive system: Secondary | ICD-10-CM

## 2016-12-20 MED ORDER — METOCLOPRAMIDE HCL 10 MG PO TABS: 10.0000 mg | ORAL_TABLET | Freq: Three times a day (TID) | ORAL | 2 refills | Status: DC

## 2016-12-23 ENCOUNTER — Other Ambulatory Visit (HOSPITAL_BASED_OUTPATIENT_CLINIC_OR_DEPARTMENT_OTHER): Payer: Medicare Other

## 2016-12-23 ENCOUNTER — Ambulatory Visit (HOSPITAL_BASED_OUTPATIENT_CLINIC_OR_DEPARTMENT_OTHER): Payer: Medicare Other | Admitting: Family

## 2016-12-23 ENCOUNTER — Other Ambulatory Visit: Payer: Self-pay

## 2016-12-23 VITALS — BP 155/69 | HR 72 | Temp 97.9°F | Resp 18 | Wt 119.0 lb

## 2016-12-23 DIAGNOSIS — R634 Abnormal weight loss: Secondary | ICD-10-CM

## 2016-12-23 DIAGNOSIS — J329 Chronic sinusitis, unspecified: Secondary | ICD-10-CM

## 2016-12-23 DIAGNOSIS — R5383 Other fatigue: Secondary | ICD-10-CM

## 2016-12-23 DIAGNOSIS — D510 Vitamin B12 deficiency anemia due to intrinsic factor deficiency: Secondary | ICD-10-CM

## 2016-12-23 DIAGNOSIS — Z720 Tobacco use: Secondary | ICD-10-CM | POA: Diagnosis not present

## 2016-12-23 DIAGNOSIS — R0602 Shortness of breath: Secondary | ICD-10-CM

## 2016-12-23 DIAGNOSIS — D649 Anemia, unspecified: Secondary | ICD-10-CM | POA: Diagnosis not present

## 2016-12-23 DIAGNOSIS — Z85818 Personal history of malignant neoplasm of other sites of lip, oral cavity, and pharynx: Secondary | ICD-10-CM

## 2016-12-23 DIAGNOSIS — R05 Cough: Secondary | ICD-10-CM

## 2016-12-23 DIAGNOSIS — C090 Malignant neoplasm of tonsillar fossa: Secondary | ICD-10-CM | POA: Diagnosis not present

## 2016-12-23 DIAGNOSIS — D500 Iron deficiency anemia secondary to blood loss (chronic): Secondary | ICD-10-CM

## 2016-12-23 DIAGNOSIS — R058 Other specified cough: Secondary | ICD-10-CM

## 2016-12-23 LAB — CBC WITH DIFFERENTIAL (CANCER CENTER ONLY)
BASO#: 0 10*3/uL (ref 0.0–0.2)
BASO%: 0.2 % (ref 0.0–2.0)
EOS%: 1.2 % (ref 0.0–7.0)
Eosinophils Absolute: 0.1 10*3/uL (ref 0.0–0.5)
HCT: 32.2 % — ABNORMAL LOW (ref 38.7–49.9)
HGB: 11.3 g/dL — ABNORMAL LOW (ref 13.0–17.1)
LYMPH#: 0.7 10*3/uL — ABNORMAL LOW (ref 0.9–3.3)
LYMPH%: 13.5 % — AB (ref 14.0–48.0)
MCH: 30.6 pg (ref 28.0–33.4)
MCHC: 35.1 g/dL (ref 32.0–35.9)
MCV: 87 fL (ref 82–98)
MONO#: 0.6 10*3/uL (ref 0.1–0.9)
MONO%: 12.4 % (ref 0.0–13.0)
NEUT#: 3.8 10*3/uL (ref 1.5–6.5)
NEUT%: 72.7 % (ref 40.0–80.0)
PLATELETS: 221 10*3/uL (ref 145–400)
RBC: 3.69 10*6/uL — ABNORMAL LOW (ref 4.20–5.70)
RDW: 13.6 % (ref 11.1–15.7)
WBC: 5.2 10*3/uL (ref 4.0–10.0)

## 2016-12-23 LAB — IRON AND TIBC
%SAT: 13 % — AB (ref 20–55)
Iron: 39 ug/dL — ABNORMAL LOW (ref 42–163)
TIBC: 313 ug/dL (ref 202–409)
UIBC: 273 ug/dL (ref 117–376)

## 2016-12-23 LAB — CMP (CANCER CENTER ONLY)
ALK PHOS: 50 U/L (ref 26–84)
ALT(SGPT): 46 U/L (ref 10–47)
AST: 38 U/L (ref 11–38)
Albumin: 3.5 g/dL (ref 3.3–5.5)
BILIRUBIN TOTAL: 0.4 mg/dL (ref 0.20–1.60)
BUN, Bld: 13 mg/dL (ref 7–22)
CALCIUM: 9.2 mg/dL (ref 8.0–10.3)
CO2: 30 meq/L (ref 18–33)
CREATININE: 1.3 mg/dL — AB (ref 0.6–1.2)
Chloride: 92 mEq/L — ABNORMAL LOW (ref 98–108)
GLUCOSE: 100 mg/dL (ref 73–118)
Potassium: 4.3 mEq/L (ref 3.3–4.7)
SODIUM: 135 meq/L (ref 128–145)
Total Protein: 7.1 g/dL (ref 6.4–8.1)

## 2016-12-23 LAB — FERRITIN: Ferritin: 511 ng/ml — ABNORMAL HIGH (ref 22–316)

## 2016-12-23 MED ORDER — OXANDROLONE 10 MG PO TABS: 10.0000 mg | ORAL_TABLET | Freq: Every day | ORAL | 2 refills | Status: DC

## 2016-12-23 NOTE — Progress Notes (Signed)
Hematology and Oncology Follow Up Visit  Bruce Olson [id number] 09-Nov-1955 61 y.o. 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 He has had some fatigue. He has some sinus congestion and drainage with productive cough with green sputum. He states that he wants to try some over the counter medications to treat this and does not want a chest xray at this time.  His SOB with any exertion is unchanged. He is still smoking.  He denies fever, chills, n/v, rash, dizziness, chest pain, palpitations, abdominal pain or changes in bowel or bladder habits.  No sore throat or problems swallowing. No changes in his voice.  No swelling, tenderness, numbness or tingling in her extremities. No c/o pain.  He is taking Oxandrin to help stimulate his appetite and Reglan to help with GERD.  His appetite comes and goes and he is trying his best to stay well hydrated. His weight is stable.   ECOG Performance Status: 1 - Symptomatic but completely ambulatory  Medications:  Allergies as of 12/23/2016   No Known Allergies     Medication List        Accurate as of 12/23/16 11:55 AM. Always use your most recent med list.          docusate sodium 100 MG capsule Commonly known as:  COLACE Take 100 mg by mouth daily.   metoCLOPramide 10 MG tablet Commonly known as:  REGLAN Take 1 tablet (10 mg total) by mouth 4 (four) times daily -  before meals and at bedtime.   mirtazapine 7.5 MG tablet Commonly known as:  REMERON Take 1 tablet (7.5 mg total) by mouth at bedtime.   multivitamin with minerals Tabs tablet Take 1 tablet by mouth daily.   oxandrolone 10 MG tablet Commonly known as:  OXANDRIN Take 1 tablet (10 mg total) by mouth daily.   oxyCODONE 30 MG 12 hr  tablet Commonly known as:  OXYCONTIN Take 1 tablet (30 mg total) 2 (two) times daily by mouth.   oxyCODONE-acetaminophen 7.5-325 MG tablet Commonly known as:  PERCOCET Take 1 tablet every 4 (four) hours as needed by mouth. May take one extra tablet when pain is sever. No more than 4 a day   pantoprazole 40 MG tablet Commonly known as:  PROTONIX Take 1 tablet (40 mg total) by mouth 2 (two) times daily before a meal.   pilocarpine 5 MG tablet Commonly known as:  SALAGEN Take 1 tablet (5 mg total) by mouth 2 (two) times daily.   pregabalin 100 MG capsule Commonly known as:  LYRICA Take 1 capsule (100 mg total) by mouth 3 (three) times daily.       Allergies: No Known Allergies  Past Medical History, Surgical history, Social history, and Family History were reviewed and updated.  Review of Systems: All other 10 point review of systems is negative.   Physical Exam:  weight is 119 lb (54 kg). His oral temperature is 97.9 F (36.6 C). His blood pressure is 155/69 (abnormal) and his pulse is 72. His respiration is 18 and oxygen saturation is 95%.   Wt Readings from Last 3 Encounters:  12/23/16 119 lb (54 kg)  12/19/16 118 lb (53.5 kg)  11/24/16 119 lb 8 oz (54.2 kg)    Ocular:  Sclerae unicteric, pupils equal, round and reactive to light Ear-nose-throat: Oropharynx clear, dentition fair Lymphatic: No cervical, supraclavicular or axillary adenopathy Lungs no rales or rhonchi, good excursion bilaterally Heart regular rate and rhythm, no murmur appreciated Abd soft, nontender, positive bowel sounds, no liver or spleen tip palpated on exam, no fluid wave  MSK no focal spinal tenderness, no joint edema Neuro: non-focal, well-oriented, appropriate affect Breasts: Deferred   Lab Results  Component Value Date   WBC 5.2 12/23/2016   HGB 11.3 (L) 12/23/2016   HCT 32.2 (L) 12/23/2016   MCV 87 12/23/2016   PLT 221 12/23/2016   Lab Results  Component Value Date   FERRITIN 380  (H) 11/24/2016   IRON 95 11/24/2016   TIBC 364 11/24/2016   UIBC 269 11/24/2016   IRONPCTSAT 26 11/24/2016   Lab Results  Component Value Date   RBC 3.69 (L) 12/23/2016   No results found for: KPAFRELGTCHN, LAMBDASER, KAPLAMBRATIO No results found for: IGGSERUM, IGA, IGMSERUM No results found for: Odetta Pink, SPEI   Chemistry      Component Value Date/Time   NA 135 12/23/2016 1123   NA 135 (L) 04/05/2016 1333   K 4.3 12/23/2016 1123   K 4.7 04/05/2016 1333   CL 92 (L) 12/23/2016 1123   CO2 30 12/23/2016 1123   CO2 29 04/05/2016 1333   BUN 13 12/23/2016 1123   BUN 23.6 04/05/2016 1333   CREATININE 1.3 (H) 12/23/2016 1123   CREATININE 1.1 04/05/2016 1333      Component Value Date/Time   CALCIUM 9.2 12/23/2016 1123   CALCIUM 9.2 04/05/2016 1333   ALKPHOS 50 12/23/2016 1123   ALKPHOS 67 04/05/2016 1333   AST 38 12/23/2016 1123   AST 17 04/05/2016 1333   ALT 46 12/23/2016 1123   ALT 10 04/05/2016 1333   BILITOT 0.40 12/23/2016 1123   BILITOT 0.30 04/05/2016 1333      Impression and Plan: Bruce Olson is a very pleasant 61 yo caucasian gentleman with history of squamous cell carcinoma of the left tonsil, HPV positive. He is currently getting over a sinus infection so there is some fatigue and congestion.  He did not want a chest xray today. Lung sounds are course throughout. He is still smoking.  We will continue to follow along with him and plan to see him back again in another month for follow-up.  His Reglan was refilled today.  He will contact our office with any questions or concerns. We can certainly see him sooner if need be.   Eliezer Bottom, NP 11/23/201811:55 AM

## 2016-12-24 LAB — VITAMIN B12: Vitamin B12: 780 pg/mL (ref 232–1245)

## 2017-01-02 ENCOUNTER — Other Ambulatory Visit: Payer: Self-pay | Admitting: *Deleted

## 2017-01-02 DIAGNOSIS — C090 Malignant neoplasm of tonsillar fossa: Secondary | ICD-10-CM

## 2017-01-02 DIAGNOSIS — R634 Abnormal weight loss: Secondary | ICD-10-CM

## 2017-01-02 MED ORDER — OXANDROLONE 10 MG PO TABS: 10.0000 mg | ORAL_TABLET | Freq: Every day | ORAL | 2 refills | Status: DC

## 2017-01-20 ENCOUNTER — Other Ambulatory Visit: Payer: Self-pay

## 2017-01-20 ENCOUNTER — Other Ambulatory Visit (HOSPITAL_BASED_OUTPATIENT_CLINIC_OR_DEPARTMENT_OTHER): Payer: Medicare Other

## 2017-01-20 ENCOUNTER — Encounter: Payer: Self-pay | Admitting: Hematology & Oncology

## 2017-01-20 ENCOUNTER — Ambulatory Visit (HOSPITAL_BASED_OUTPATIENT_CLINIC_OR_DEPARTMENT_OTHER): Payer: Medicare Other | Admitting: Hematology & Oncology

## 2017-01-20 VITALS — BP 140/61 | HR 68 | Temp 97.8°F | Resp 18 | Wt 121.0 lb

## 2017-01-20 DIAGNOSIS — K922 Gastrointestinal hemorrhage, unspecified: Secondary | ICD-10-CM | POA: Diagnosis not present

## 2017-01-20 DIAGNOSIS — R630 Anorexia: Secondary | ICD-10-CM | POA: Diagnosis not present

## 2017-01-20 DIAGNOSIS — D500 Iron deficiency anemia secondary to blood loss (chronic): Secondary | ICD-10-CM

## 2017-01-20 DIAGNOSIS — J329 Chronic sinusitis, unspecified: Secondary | ICD-10-CM | POA: Diagnosis not present

## 2017-01-20 DIAGNOSIS — Z720 Tobacco use: Secondary | ICD-10-CM

## 2017-01-20 DIAGNOSIS — Z85818 Personal history of malignant neoplasm of other sites of lip, oral cavity, and pharynx: Secondary | ICD-10-CM | POA: Diagnosis not present

## 2017-01-20 DIAGNOSIS — D510 Vitamin B12 deficiency anemia due to intrinsic factor deficiency: Secondary | ICD-10-CM

## 2017-01-20 DIAGNOSIS — K219 Gastro-esophageal reflux disease without esophagitis: Secondary | ICD-10-CM

## 2017-01-20 DIAGNOSIS — C090 Malignant neoplasm of tonsillar fossa: Secondary | ICD-10-CM

## 2017-01-20 LAB — CBC WITH DIFFERENTIAL (CANCER CENTER ONLY)
BASO#: 0 10*3/uL (ref 0.0–0.2)
BASO%: 0.2 % (ref 0.0–2.0)
EOS ABS: 0.2 10*3/uL (ref 0.0–0.5)
EOS%: 3.2 % (ref 0.0–7.0)
HCT: 31.9 % — ABNORMAL LOW (ref 38.7–49.9)
HGB: 10.9 g/dL — ABNORMAL LOW (ref 13.0–17.1)
LYMPH#: 1 10*3/uL (ref 0.9–3.3)
LYMPH%: 22 % (ref 14.0–48.0)
MCH: 30.4 pg (ref 28.0–33.4)
MCHC: 34.2 g/dL (ref 32.0–35.9)
MCV: 89 fL (ref 82–98)
MONO#: 0.7 10*3/uL (ref 0.1–0.9)
MONO%: 15.2 % — ABNORMAL HIGH (ref 0.0–13.0)
NEUT#: 2.8 10*3/uL (ref 1.5–6.5)
NEUT%: 59.4 % (ref 40.0–80.0)
PLATELETS: 240 10*3/uL (ref 145–400)
RBC: 3.58 10*6/uL — ABNORMAL LOW (ref 4.20–5.70)
RDW: 14 % (ref 11.1–15.7)
WBC: 4.7 10*3/uL (ref 4.0–10.0)

## 2017-01-20 LAB — CMP (CANCER CENTER ONLY)
ALT(SGPT): 26 U/L (ref 10–47)
AST: 25 U/L (ref 11–38)
Albumin: 3.5 g/dL (ref 3.3–5.5)
Alkaline Phosphatase: 51 U/L (ref 26–84)
BUN: 15 mg/dL (ref 7–22)
CHLORIDE: 97 meq/L — AB (ref 98–108)
CO2: 30 mEq/L (ref 18–33)
Calcium: 9.2 mg/dL (ref 8.0–10.3)
Creat: 1.2 mg/dl (ref 0.6–1.2)
GLUCOSE: 102 mg/dL (ref 73–118)
POTASSIUM: 4.5 meq/L (ref 3.3–4.7)
Sodium: 138 mEq/L (ref 128–145)
Total Bilirubin: 0.4 mg/dl (ref 0.20–1.60)
Total Protein: 6.8 g/dL (ref 6.4–8.1)

## 2017-01-20 MED ORDER — SUCRALFATE 1 GM/10ML PO SUSP: 1.0000 g | Freq: Three times a day (TID) | ORAL | 4 refills | Status: DC

## 2017-01-20 MED ORDER — SCOPOLAMINE 1 MG/3DAYS TD PT72: 1.0000 | MEDICATED_PATCH | TRANSDERMAL | 12 refills | Status: DC

## 2017-01-20 NOTE — Progress Notes (Signed)
Hematology and Oncology Follow Up Visit  CADIN LUKA [id number] [birth year] 61 y.o. 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 - completed in August 2017  Current Therapy:   Observation   Interim History:  Mr. Agerton is here today for follow-up. He has had some fatigue. He has some sinus congestion and drainage with productive cough with green sputum. He states that he wants to try some over the counter medications to treat this and does not want a chest xray at this time.  His SOB with any exertion is unchanged. He is still smoking.  He denies fever, chills, n/v, rash, dizziness, chest pain, palpitations, abdominal pain or changes in bowel or bladder habits.  No sore throat or problems swallowing. No changes in his voice.  No swelling, tenderness, numbness or tingling in her extremities. No c/o pain.  He is taking Oxandrin to help stimulate his appetite and Reglan to help with GERD.  His appetite comes and goes and he is trying his best to stay well hydrated. His weight is stable.   ECOG Performance Status: 1 - Symptomatic but completely ambulatory  Medications:  Allergies as of 01/20/2017   No Known Allergies     Medication List        Accurate as of 01/20/17  5:28 PM. Always use your most recent med list.          docusate sodium 100 MG capsule Commonly known as:  COLACE Take 100 mg by mouth daily.   metoCLOPramide 10 MG tablet Commonly known as:  REGLAN Take 1 tablet (10 mg total) by mouth 4 (four) times daily -  before meals and at bedtime.   mirtazapine 7.5 MG tablet Commonly known as:  REMERON Take 1 tablet (7.5 mg total) by mouth at bedtime.   multivitamin with minerals Tabs tablet Take 1 tablet by mouth daily.   oxandrolone 10 MG tablet Commonly known as:  OXANDRIN Take 1 tablet (10 mg total) by mouth daily.   oxyCODONE 30 MG 12 hr  tablet Commonly known as:  OXYCONTIN Take 1 tablet (30 mg total) 2 (two) times daily by mouth.   oxyCODONE-acetaminophen 7.5-325 MG tablet Commonly known as:  PERCOCET Take 1 tablet every 4 (four) hours as needed by mouth. May take one extra tablet when pain is sever. No more than 4 a day   pantoprazole 40 MG tablet Commonly known as:  PROTONIX Take 1 tablet (40 mg total) by mouth 2 (two) times daily before a meal.   pilocarpine 5 MG tablet Commonly known as:  SALAGEN Take 1 tablet (5 mg total) by mouth 2 (two) times daily.   pregabalin 100 MG capsule Commonly known as:  LYRICA Take 1 capsule (100 mg total) by mouth 3 (three) times daily.       Allergies: No Known Allergies  Past Medical History, Surgical history, Social history, and Family History were reviewed and updated.  Review of Systems: Review of Systems  All other systems reviewed and are negative.   Physical Exam:  weight is 121 lb (54.9 kg). His oral temperature is 97.8 F (36.6 C). His blood pressure is 140/61 and his pulse is 68. His respiration is 18 and oxygen saturation is 99%.   Wt Readings from Last 3 Encounters:  01/20/17 121 lb (54.9 kg)  12/23/16 119 lb (54 kg)  12/19/16 118 lb (53.5 kg)    Physical  Exam  Constitutional: He is oriented to person, place, and time.  HENT:  Head: Normocephalic and atraumatic.  Mouth/Throat: Oropharynx is clear and moist.  Eyes: EOM are normal. Pupils are equal, round, and reactive to light.  Neck: Normal range of motion.  Cardiovascular: Normal rate, regular rhythm and normal heart sounds.  Pulmonary/Chest: Effort normal and breath sounds normal.  Abdominal: Soft. Bowel sounds are normal.  Musculoskeletal: Normal range of motion. He exhibits no edema, tenderness or deformity.  Lymphadenopathy:    He has no cervical adenopathy.  Neurological: He is alert and oriented to person, place, and time.  Skin: Skin is warm and dry. No rash noted. No erythema.    Psychiatric: He has a normal mood and affect. His behavior is normal. Judgment and thought content normal.  Vitals reviewed.    Lab Results  Component Value Date   WBC 4.7 01/20/2017   HGB 10.9 (L) 01/20/2017   HCT 31.9 (L) 01/20/2017   MCV 89 01/20/2017   PLT 240 01/20/2017   Lab Results  Component Value Date   FERRITIN 511 (H) 12/23/2016   IRON 39 (L) 12/23/2016   TIBC 313 12/23/2016   UIBC 273 12/23/2016   IRONPCTSAT 13 (L) 12/23/2016   Lab Results  Component Value Date   RBC 3.58 (L) 01/20/2017   No results found for: KPAFRELGTCHN, LAMBDASER, KAPLAMBRATIO No results found for: IGGSERUM, IGA, IGMSERUM No results found for: Odetta Pink, SPEI   Chemistry      Component Value Date/Time   NA 138 01/20/2017 1324   NA 135 (L) 04/05/2016 1333   K 4.5 01/20/2017 1324   K 4.7 04/05/2016 1333   CL 97 (L) 01/20/2017 1324   CO2 30 01/20/2017 1324   CO2 29 04/05/2016 1333   BUN 15 01/20/2017 1324   BUN 23.6 04/05/2016 1333   CREATININE 1.2 01/20/2017 1324   CREATININE 1.1 04/05/2016 1333      Component Value Date/Time   CALCIUM 9.2 01/20/2017 1324   CALCIUM 9.2 04/05/2016 1333   ALKPHOS 51 01/20/2017 1324   ALKPHOS 67 04/05/2016 1333   AST 25 01/20/2017 1324   AST 17 04/05/2016 1333   ALT 26 01/20/2017 1324   ALT 10 04/05/2016 1333   BILITOT 0.40 01/20/2017 1324   BILITOT 0.30 04/05/2016 1333      Impression and Plan: Mr. Langenderfer is a very pleasant 61 yo caucasian gentleman with history of squamous cell carcinoma of the left tonsil, HPV positive. He is currently getting over a sinus infection so there is some fatigue and congestion.  He did not want a chest xray today. Lung sounds are course throughout. He is still smoking.  We will continue to follow along with him and plan to see him back again in another month for follow-up.  His Reglan was refilled today.  He will contact our office with any questions  or concerns. We can certainly see him sooner if need be.   [name], MD 12/21/20185:28 PM

## 2017-01-21 LAB — VITAMIN B12: VITAMIN B 12: 821 pg/mL (ref 232–1245)

## 2017-01-23 LAB — IRON AND TIBC
%SAT: 19 % — ABNORMAL LOW (ref 20–55)
Iron: 60 ug/dL (ref 42–163)
TIBC: 319 ug/dL (ref 202–409)
UIBC: 258 ug/dL (ref 117–376)

## 2017-01-23 LAB — FERRITIN: FERRITIN: 360 ng/mL — AB (ref 22–316)

## 2017-01-25 ENCOUNTER — Other Ambulatory Visit: Payer: Self-pay | Admitting: *Deleted

## 2017-01-25 MED ORDER — SCOPOLAMINE 1 MG/3DAYS TD PT72: 1.0000 | MEDICATED_PATCH | TRANSDERMAL | 12 refills | Status: DC

## 2017-01-25 MED ORDER — SUCRALFATE 1 GM/10ML PO SUSP: 1.0000 g | Freq: Three times a day (TID) | ORAL | 4 refills | Status: DC

## 2017-01-26 ENCOUNTER — Encounter: Payer: Self-pay | Admitting: Registered Nurse

## 2017-01-26 ENCOUNTER — Encounter: Payer: Medicare Other | Attending: Physical Medicine and Rehabilitation | Admitting: Registered Nurse

## 2017-01-26 VITALS — BP 144/69 | HR 58 | Resp 14

## 2017-01-26 DIAGNOSIS — Z9889 Other specified postprocedural states: Secondary | ICD-10-CM | POA: Insufficient documentation

## 2017-01-26 DIAGNOSIS — M542 Cervicalgia: Secondary | ICD-10-CM | POA: Insufficient documentation

## 2017-01-26 DIAGNOSIS — Z79899 Other long term (current) drug therapy: Secondary | ICD-10-CM | POA: Insufficient documentation

## 2017-01-26 DIAGNOSIS — G894 Chronic pain syndrome: Secondary | ICD-10-CM | POA: Diagnosis not present

## 2017-01-26 DIAGNOSIS — M5416 Radiculopathy, lumbar region: Secondary | ICD-10-CM | POA: Diagnosis not present

## 2017-01-26 DIAGNOSIS — Z5181 Encounter for therapeutic drug level monitoring: Secondary | ICD-10-CM | POA: Diagnosis not present

## 2017-01-26 DIAGNOSIS — M5412 Radiculopathy, cervical region: Secondary | ICD-10-CM

## 2017-01-26 DIAGNOSIS — M7918 Myalgia, other site: Secondary | ICD-10-CM

## 2017-01-26 DIAGNOSIS — M961 Postlaminectomy syndrome, not elsewhere classified: Secondary | ICD-10-CM | POA: Insufficient documentation

## 2017-01-26 DIAGNOSIS — M47812 Spondylosis without myelopathy or radiculopathy, cervical region: Secondary | ICD-10-CM | POA: Diagnosis not present

## 2017-01-26 MED ORDER — OXYCODONE-ACETAMINOPHEN 7.5-325 MG PO TABS: 1.0000 | ORAL_TABLET | ORAL | 0 refills | Status: DC | PRN

## 2017-01-26 MED ORDER — OXYCODONE HCL ER 30 MG PO T12A: 30.0000 mg | EXTENDED_RELEASE_TABLET | Freq: Two times a day (BID) | ORAL | 0 refills | Status: DC

## 2017-01-26 NOTE — Progress Notes (Signed)
Subjective:    Patient ID: Bruce Olson, male    DOB: [birth year], 61 y.o.   MRN: [id number]  HPI: Bruce Olson is a 61 year old male who returns for follow up appointmentfor chronic pain and medication refill. He states his pain is located in his neck radiating into his right shoulder and upper back mainly right side and lower back pain.He rates his pain 8. His current exercise regime is walking.   [name] equivalent is 135.00 MME.  Bruce Olson reports his appetite is good he's drinking his nourishment shakes, he's lost 1.5 lb this month, his Oncologist is following.   Last UDS was performed on 11/21/2016, it was consistent.   Pain Inventory Average Pain 8 Pain Right Now 8 My pain is sharp, stabbing and tingling  In the last 24 hours, has pain interfered with the following? General activity 8 Relation with others 8 Enjoyment of life 8 What TIME of day is your pain at its worst? all Sleep (in general) Poor  Pain is worse with: walking, sitting and standing Pain improves with: medication and TENS Relief from Meds: 2  Mobility walk without assistance use a cane how many minutes can you walk? 15 ability to climb steps?  yes do you drive?  yes  Function disabled: date disabled 2012  Neuro/Psych weakness numbness tremor trouble walking  Prior Studies Any changes since last visit?  no  Physicians involved in your care Any changes since last visit?  no   Family History  Problem Relation Age of Onset  . Anesthesia problems Neg Hx   . Hypotension Neg Hx   . Malignant hyperthermia Neg Hx   . Pseudochol deficiency Neg Hx    Social History   Socioeconomic History  . Marital status: Single    Spouse name: None  . Number of children: None  . Years of education: None  . Highest education level: None  Social Needs  . Financial resource strain: None  . Food insecurity - worry: None  . Food insecurity - inability: None  .  Transportation needs - medical: None  . Transportation needs - non-medical: None  Occupational History  . None  Tobacco Use  . Smoking status: Current Every Day Smoker    Packs/day: 1.00    Years: 33.00    Pack years: 33.00    Types: Cigarettes  . Smokeless tobacco: Never Used  Substance and Sexual Activity  . Alcohol use: No  . Drug use: No  . Sexual activity: No    Comment: back problems  Other Topics Concern  . None  Social History Narrative  . None   Past Surgical History:  Procedure Laterality Date  . BACK SURGERY  2013   Back surgery  (fusion)09/15/2010 & 2013  . BACK SURGERY  03-2013  . HARDWARE REMOVAL N/A 03/14/2013   Procedure: HARDWARE REMOVAL;  Surgeon: Eustace Moore, MD;  Location: [facility] NEURO ORS;  Service: Neurosurgery;  Laterality: N/A;   Past Medical History:  Diagnosis Date  . Goals of care, counseling/discussion 04/07/2016  . Iron deficiency anemia due to chronic blood loss 10/21/2016  . 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 No pertinent past medical history   . Occupational injury 2013   resulted in back injury that led to surgery  . Thyroid cancer ([place])    BP (!) 144/69 (BP Location: Left Arm, Patient Position: Sitting, Cuff Size: Normal)   Pulse (!) 58   Resp 14   SpO2 96%  Opioid Risk Score:  3 Fall Risk Score:  `1  Depression screen PHQ 2/9  Depression screen [facility] 2/9 11/21/2016 09/27/2016 08/25/2016 06/01/2016 08/26/2015 09/22/2014 08/20/2014  Decreased Interest 0 0 0 0 0 0 2  Down, Depressed, Hopeless 0 0 0 0 0 0 0  PHQ - 2 Score 0 0 0 0 0 0 2  Altered sleeping - - - - - - -  Tired, decreased energy - - - - - - -  Change in appetite - - - - - - -  Feeling bad or failure about yourself  - - - - - - -  Trouble concentrating - - - - - - -  Moving slowly or fidgety/restless - - - - - - -  Suicidal thoughts - - - - - - -  PHQ-9 Score - - - - - - -     Review of Systems  Constitutional:       Needs to gain weight per onc  HENT: Negative.   Eyes:  Negative.   Respiratory: Negative.   Cardiovascular: Negative.   Gastrointestinal: Negative.   Endocrine: Negative.   Genitourinary: Negative.   Musculoskeletal: Positive for back pain, gait problem and myalgias.  Skin: Negative.   Allergic/Immunologic: Negative.   Neurological: Positive for tremors, weakness and numbness.  Hematological: Negative.   Psychiatric/Behavioral: Negative.   All other systems reviewed and are negative.      Objective:   Physical Exam  Constitutional: He is oriented to person, place, and time. He appears well-developed and well-nourished.  HENT:  Head: Normocephalic and atraumatic.  Neck: Normal range of motion. Neck supple.  Cervical Paraspinal Tenderness: C-5-C-6  Mainly Right Side  Cardiovascular: Normal rate and regular rhythm.  Pulmonary/Chest: Effort normal and breath sounds normal.  Musculoskeletal:  Normal Muscle Bulk and Muscle Testing Reveals: Upper Extremities: Full ROM and Muscle Strength 5/5 Right AC Joint Tenderness Thoracic Hypersensitivity: T-1-T-7  T-10- T-12 Lumbar Hypersensitivity Lower Extremities: Full ROM and Muscle Strength 5/5 Arises from Table with ease Narrow Based Gait  Neurological: He is alert and oriented to person, place, and time.  Skin: Skin is warm and dry.  Psychiatric: He has a normal mood and affect.  Nursing note and vitals reviewed.         Assessment & Plan:  1. Lumbar postlaminectomy syndrome status post lumbar fusion x3: His most recent surgery was 03/14/2013. He had hardware removal and bone allograft waist and L2-3. Continue to Monitor. 01/26/2017 Refilled: oxyCODONE 7.5/325mg  one tablet every 4 hours as needed #100 and Oxycontin 30 mg one tablet every 12 hours #60.  We will continue the opioid monitoring program, this consists of regular clinic visits, examinations, urine drug screen, pill counts as well as use of [place] Controlled Substance Reporting System. 2. Lumbar Radiculopathy:  Continue Lyrica. 01/26/2017 3. Insomnia: ContinueTrazodone.01/26/2017 4. Nausea: Continue Zofran as needed. 01/26/2017  20 minutes of face to face patient care time was spent during this visit. All questions were encouraged and answered.  F/U in 1 month

## 2017-02-01 ENCOUNTER — Other Ambulatory Visit: Payer: Self-pay | Admitting: *Deleted

## 2017-02-01 MED ORDER — MIRTAZAPINE 7.5 MG PO TABS: 7.5000 mg | ORAL_TABLET | Freq: Every day | ORAL | 1 refills | Status: DC

## 2017-02-02 ENCOUNTER — Telehealth: Payer: Self-pay | Admitting: *Deleted

## 2017-02-02 NOTE — Telephone Encounter (Signed)
Oral swab drug screen was consistent for prescribed medications.  ?

## 2017-02-09 ENCOUNTER — Inpatient Hospital Stay: Payer: Medicare Other | Attending: Hematology & Oncology | Admitting: Hematology & Oncology

## 2017-02-09 ENCOUNTER — Other Ambulatory Visit: Payer: Self-pay

## 2017-02-09 ENCOUNTER — Encounter: Payer: Self-pay | Admitting: Hematology & Oncology

## 2017-02-09 ENCOUNTER — Inpatient Hospital Stay: Payer: Medicare Other

## 2017-02-09 VITALS — BP 138/55 | HR 69 | Temp 98.1°F | Resp 18 | Wt 121.0 lb

## 2017-02-09 DIAGNOSIS — Z85818 Personal history of malignant neoplasm of other sites of lip, oral cavity, and pharynx: Secondary | ICD-10-CM | POA: Diagnosis not present

## 2017-02-09 DIAGNOSIS — Z923 Personal history of irradiation: Secondary | ICD-10-CM

## 2017-02-09 DIAGNOSIS — C090 Malignant neoplasm of tonsillar fossa: Secondary | ICD-10-CM

## 2017-02-09 LAB — CBC WITH DIFFERENTIAL (CANCER CENTER ONLY)
Basophils Absolute: 0 10*3/uL (ref 0.0–0.1)
Basophils Relative: 1 %
Eosinophils Absolute: 0.1 10*3/uL (ref 0.0–0.5)
Eosinophils Relative: 2 %
HEMATOCRIT: 31.9 % — AB (ref 38.7–49.9)
HEMOGLOBIN: 11.1 g/dL — AB (ref 13.0–17.1)
LYMPHS ABS: 1.1 10*3/uL (ref 0.9–3.3)
Lymphocytes Relative: 23 %
MCH: 31 pg (ref 28.0–33.4)
MCHC: 34.8 g/dL (ref 32.0–35.9)
MCV: 89.1 fL (ref 82.0–98.0)
MONOS PCT: 17 %
Monocytes Absolute: 0.8 10*3/uL (ref 0.1–0.9)
NEUTROS ABS: 2.6 10*3/uL (ref 1.5–6.5)
NEUTROS PCT: 57 %
Platelet Count: 232 10*3/uL (ref 140–400)
RBC: 3.58 MIL/uL — ABNORMAL LOW (ref 4.20–5.70)
RDW: 13.6 % (ref 11.1–15.7)
WBC Count: 4.6 10*3/uL (ref 4.0–10.3)

## 2017-02-09 LAB — CMP (CANCER CENTER ONLY)
ALBUMIN: 3.9 g/dL (ref 3.5–5.0)
ALT: 20 U/L (ref 0–55)
ANION GAP: 8 (ref 3–11)
AST: 23 U/L (ref 5–34)
Alkaline Phosphatase: 53 U/L (ref 40–150)
BUN: 15 mg/dL (ref 7–26)
CO2: 26 mmol/L (ref 22–29)
Calcium: 8.8 mg/dL (ref 8.4–10.4)
Chloride: 96 mmol/L — ABNORMAL LOW (ref 98–109)
Creatinine: 1.31 mg/dL — ABNORMAL HIGH (ref 0.70–1.30)
GFR, Estimated: 57 mL/min — ABNORMAL LOW (ref >60)
GLUCOSE: 78 mg/dL (ref 70–140)
Potassium: 5.2 mmol/L — ABNORMAL HIGH (ref 3.5–5.1)
SODIUM: 130 mmol/L — AB (ref 136–145)
Total Bilirubin: 0.2 mg/dL (ref 0.2–1.2)
Total Protein: 6.8 g/dL (ref 6.4–8.3)

## 2017-02-09 NOTE — Progress Notes (Signed)
Hematology and Oncology Follow Up Visit  Bruce Olson [id number] [birth year] 62 y.o. 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  He is looking a little bit better.  He had a good holiday season.  He is eating okay.  He has had a little bit of nausea.  He has had no bleeding.  There is been no cough or shortness of breath.  He has had no headaches.  There is been no leg swelling.  He has had no rashes.  He has had no issues with swallowing.  ECOG Performance Status: 1 - Symptomatic but completely ambulatory  Medications:  Allergies as of 02/09/2017   No Known Allergies     Medication List        Accurate as of 02/09/17 12:45 PM. Always use your most recent med list.          docusate sodium 100 MG capsule Commonly known as:  COLACE Take 100 mg by mouth daily.   metoCLOPramide 10 MG tablet Commonly known as:  REGLAN Take 1 tablet (10 mg total) by mouth 4 (four) times daily -  before meals and at bedtime.   mirtazapine 7.5 MG tablet Commonly known as:  REMERON Take 1 tablet (7.5 mg total) by mouth at bedtime.   multivitamin with minerals Tabs tablet Take 1 tablet by mouth daily.   oxandrolone 10 MG tablet Commonly known as:  OXANDRIN Take 1 tablet (10 mg total) by mouth daily.   oxyCODONE 30 MG 12 hr tablet Commonly known as:  OXYCONTIN Take 1 tablet (30 mg total) by mouth 2 (two) times daily.   oxyCODONE-acetaminophen 7.5-325 MG tablet Commonly known as:  PERCOCET Take 1 tablet by mouth every 4 (four) hours as needed. May take one extra tablet when pain is sever. No more than 4 a day   pantoprazole 40 MG tablet Commonly known as:  PROTONIX Take 1 tablet (40 mg total) by mouth 2 (two) times daily before a meal.     pilocarpine 5 MG tablet Commonly known as:  SALAGEN Take 1 tablet (5 mg total) by mouth 2 (two) times daily.   pregabalin 100 MG capsule Commonly known as:  LYRICA Take 1 capsule (100 mg total) by mouth 3 (three) times daily.   scopolamine 1 MG/3DAYS Commonly known as:  TRANSDERM-SCOP Place 1 patch (1.5 mg total) onto the skin every 3 (three) days.   sucralfate 1 GM/10ML suspension Commonly known as:  CARAFATE Take 10 mLs (1 g total) by mouth 4 (four) times daily -  with meals and at bedtime.       Allergies: No Known Allergies  Past Medical History, Surgical history, Social history, and Family History were reviewed and updated.  Review of Systems: Review of Systems  All other systems reviewed and are negative.   Physical Exam:  weight is 121 lb (54.9 kg). His oral temperature is 98.1 F (36.7 C). His blood pressure is 138/55 (abnormal) and his pulse is 69. His respiration is 18 and oxygen saturation is 99%.   Wt Readings from Last 3 Encounters:  02/09/17 121 lb (54.9 kg)  01/20/17 121 lb (54.9 kg)  12/23/16 119 lb (54 kg)    Physical Exam  Constitutional: He is oriented to person, place, and time.  HENT:  Head: Normocephalic and atraumatic.  Mouth/Throat: Oropharynx is clear and moist.  Eyes: EOM are normal. Pupils are equal, round, and reactive to light.  Neck: Normal range of motion.  Cardiovascular: Normal rate, regular rhythm and normal heart sounds.  Pulmonary/Chest: Effort normal and breath sounds normal.  Abdominal: Soft. Bowel sounds are normal.  Musculoskeletal: Normal range of motion. He exhibits no edema, tenderness or deformity.  Lymphadenopathy:    He has no cervical adenopathy.  Neurological: He is alert and oriented to person, place, and time.  Skin: Skin is warm and dry. No rash noted. No erythema.  Psychiatric: He has a normal mood and affect. His behavior is normal. Judgment and thought content normal.  Vitals reviewed.    Lab Results   Component Value Date   WBC 4.7 01/20/2017   HGB 10.9 (L) 01/20/2017   HCT 31.9 (L) 02/09/2017   MCV 89.1 02/09/2017   PLT 240 01/20/2017   Lab Results  Component Value Date   FERRITIN 360 (H) 01/20/2017   IRON 60 01/20/2017   TIBC 319 01/20/2017   UIBC 258 01/20/2017   IRONPCTSAT 19 (L) 01/20/2017   Lab Results  Component Value Date   RBC 3.58 (L) 02/09/2017   No results found for: KPAFRELGTCHN, LAMBDASER, KAPLAMBRATIO No results found for: IGGSERUM, IGA, IGMSERUM No results found for: Odetta Pink, SPEI   Chemistry      Component Value Date/Time   NA 138 01/20/2017 1324   NA 135 (L) 04/05/2016 1333   K 4.5 01/20/2017 1324   K 4.7 04/05/2016 1333   CL 97 (L) 01/20/2017 1324   CO2 30 01/20/2017 1324   CO2 29 04/05/2016 1333   BUN 15 01/20/2017 1324   BUN 23.6 04/05/2016 1333   CREATININE 1.2 01/20/2017 1324   CREATININE 1.1 04/05/2016 1333      Component Value Date/Time   CALCIUM 9.2 01/20/2017 1324   CALCIUM 9.2 04/05/2016 1333   ALKPHOS 51 01/20/2017 1324   ALKPHOS 67 04/05/2016 1333   AST 25 01/20/2017 1324   AST 17 04/05/2016 1333   ALT 26 01/20/2017 1324   ALT 10 04/05/2016 1333   BILITOT 0.40 01/20/2017 1324   BILITOT 0.30 04/05/2016 1333      Impression and Plan: Bruce Olson is a very pleasant 62 yo caucasian gentleman with history of squamous cell carcinoma of the left tonsil, HPV positive.  Again, he actually looks pretty good.  I still do not see any evidence of recurrent disease with respect to his squamous cell carcinoma.  I think we can get him through the wintertime now.  We will see him back in 3 months. [name], MD 1/10/201912:45 PM

## 2017-02-21 ENCOUNTER — Other Ambulatory Visit: Payer: Self-pay | Admitting: Family

## 2017-02-21 DIAGNOSIS — Z8719 Personal history of other diseases of the digestive system: Secondary | ICD-10-CM

## 2017-02-23 ENCOUNTER — Encounter: Payer: Medicare Other | Attending: Physical Medicine and Rehabilitation | Admitting: Registered Nurse

## 2017-02-23 ENCOUNTER — Encounter: Payer: Self-pay | Admitting: Registered Nurse

## 2017-02-23 VITALS — BP 145/79 | HR 77

## 2017-02-23 DIAGNOSIS — M5412 Radiculopathy, cervical region: Secondary | ICD-10-CM | POA: Diagnosis not present

## 2017-02-23 DIAGNOSIS — M47812 Spondylosis without myelopathy or radiculopathy, cervical region: Secondary | ICD-10-CM | POA: Diagnosis not present

## 2017-02-23 DIAGNOSIS — M961 Postlaminectomy syndrome, not elsewhere classified: Secondary | ICD-10-CM

## 2017-02-23 DIAGNOSIS — M542 Cervicalgia: Secondary | ICD-10-CM

## 2017-02-23 DIAGNOSIS — M5416 Radiculopathy, lumbar region: Secondary | ICD-10-CM | POA: Insufficient documentation

## 2017-02-23 DIAGNOSIS — Z9889 Other specified postprocedural states: Secondary | ICD-10-CM | POA: Insufficient documentation

## 2017-02-23 DIAGNOSIS — Z79899 Other long term (current) drug therapy: Secondary | ICD-10-CM | POA: Diagnosis not present

## 2017-02-23 DIAGNOSIS — G894 Chronic pain syndrome: Secondary | ICD-10-CM | POA: Diagnosis not present

## 2017-02-23 DIAGNOSIS — Z5181 Encounter for therapeutic drug level monitoring: Secondary | ICD-10-CM | POA: Insufficient documentation

## 2017-02-23 MED ORDER — OXYCODONE-ACETAMINOPHEN 7.5-325 MG PO TABS: 1.0000 | ORAL_TABLET | ORAL | 0 refills | Status: DC | PRN

## 2017-02-23 MED ORDER — PREGABALIN 100 MG PO CAPS: 100.0000 mg | ORAL_CAPSULE | Freq: Three times a day (TID) | ORAL | 5 refills | Status: DC

## 2017-02-23 MED ORDER — OXYCODONE HCL ER 30 MG PO T12A: 30.0000 mg | EXTENDED_RELEASE_TABLET | Freq: Two times a day (BID) | ORAL | 0 refills | Status: DC

## 2017-02-23 NOTE — Progress Notes (Signed)
Subjective:    Patient ID: Bruce Olson, male    DOB: 1955-02-22, 62 y.o.   MRN: [id number]  HPI: Bruce Olson is a 62 year old male who returns for follow up appointmentfor chronic pain and medication refill. He states his pain is located in his neck radiating into his right shoulder and mid-lower back pain. He rates his pain 8. His current exercise regime is walking.  Bruce Olson Morphine equivalent is 127.50 MME.  Bruce Olson reports  his appetite is improving  his Oncologist is following.   Last UDS was performed on 11/21/2016, it was consistent.   Pain Inventory Average Pain 8 Pain Right Now 8 My pain is sharp, stabbing and tingling  In the last 24 hours, has pain interfered with the following? General activity 8 Relation with others 8 Enjoyment of life 8 What TIME of day is your pain at its worst? all Sleep (in general) Poor  Pain is worse with: walking, sitting and standing Pain improves with: medication and TENS Relief from Meds: 2  Mobility walk without assistance use a walker how many minutes can you walk? Marland Kitchen ability to climb steps?  yes do you drive?  yes  Function disabled: date disabled 2012  Neuro/Psych weakness numbness tremor trouble walking  Prior Studies Any changes since last visit?  no  Physicians involved in your care Any changes since last visit?  no   Family History  Problem Relation Age of Onset  . Anesthesia problems Neg Hx   . Hypotension Neg Hx   . Malignant hyperthermia Neg Hx   . Pseudochol deficiency Neg Hx    Social History   Socioeconomic History  . Marital status: Single    Spouse name: None  . Number of children: None  . Years of education: None  . Highest education level: None  Social Needs  . Financial resource strain: None  . Food insecurity - worry: None  . Food insecurity - inability: None  . Transportation needs - medical: None  . Transportation needs - non-medical: None    Occupational History  . None  Tobacco Use  . Smoking status: Current Every Day Smoker    Packs/day: 1.00    Years: 33.00    Pack years: 33.00    Types: Cigarettes  . Smokeless tobacco: Never Used  Substance and Sexual Activity  . Alcohol use: No  . Drug use: No  . Sexual activity: No    Comment: back problems  Other Topics Concern  . None  Social History Narrative  . None   Past Surgical History:  Procedure Laterality Date  . BACK SURGERY  2013   Back surgery  (fusion)09/15/2010 & 2013  . BACK SURGERY  03-2013  . HARDWARE REMOVAL N/A 03/14/2013   Procedure: HARDWARE REMOVAL;  Surgeon: Eustace Moore, MD;  Location: [place] [facility];  Service: Neurosurgery;  Laterality: N/A;   Past Medical History:  Diagnosis Date  . Goals of care, counseling/discussion 04/07/2016  . Iron deficiency anemia due to chronic blood loss 10/21/2016  . 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 No pertinent past medical history   . Occupational injury 2013   resulted in back injury that led to surgery  . Thyroid cancer ([place])    BP (!) 145/79 (BP Location: Left Arm, Patient Position: Sitting, Cuff Size: Normal)   Pulse 77   SpO2 95%   Opioid Risk Score:  3 Fall Risk Score:  `1  Depression screen PHQ 2/9  Depression  screen [facility] 2/9 11/21/2016 09/27/2016 08/25/2016 06/01/2016 08/26/2015 09/22/2014 08/20/2014  Decreased Interest 0 0 0 0 0 0 2  Down, Depressed, Hopeless 0 0 0 0 0 0 0  PHQ - 2 Score 0 0 0 0 0 0 2  Altered sleeping - - - - - - -  Tired, decreased energy - - - - - - -  Change in appetite - - - - - - -  Feeling bad or failure about yourself  - - - - - - -  Trouble concentrating - - - - - - -  Moving slowly or fidgety/restless - - - - - - -  Suicidal thoughts - - - - - - -  PHQ-9 Score - - - - - - -     Review of Systems  Constitutional:       Needs to gain weight per onc  HENT: Negative.   Eyes: Negative.   Respiratory: Negative.   Cardiovascular: Negative.   Gastrointestinal: Negative.    Endocrine: Negative.   Genitourinary: Negative.   Musculoskeletal: Positive for back pain, gait problem and myalgias.  Skin: Negative.   Allergic/Immunologic: Negative.   Neurological: Positive for tremors, weakness and numbness.  Hematological: Negative.   Psychiatric/Behavioral: Negative.   All other systems reviewed and are negative.      Objective:   Physical Exam  Constitutional: He is oriented to person, place, and time. He appears well-developed and well-nourished.  HENT:  Head: Normocephalic and atraumatic.  Neck: Normal range of motion. Neck supple.  Cervical Paraspinal Tenderness: C-5-C-6   Mainly Right Side  Cardiovascular: Normal rate and regular rhythm.  Pulmonary/Chest: Effort normal and breath sounds normal.  Musculoskeletal:  Normal Muscle Bulk and Muscle Testing Reveals:  Upper Extremities: Full ROM and Muscle Strength 5/5 Right AC Joint Tenderenss Thoracic Paraspinal Tenderness: T-1-T-3 Mainly Right Side Lumbar Paraspinal Tenderness: L-3-L-5 Lower Extremities: Full ROM and Muscle Strength 5/5 Arises from Table with ease Narrow Based Gait  Neurological: He is alert and oriented to person, place, and time.  Skin: Skin is warm and dry.  Psychiatric: He has a normal mood and affect.  Nursing note and vitals reviewed.         Assessment & Plan:  1. Lumbar postlaminectomy syndrome status post lumbar fusion x3: His most recent surgery was 03/14/2013. He had hardware removal and bone allograft waist and L2-3. Continue to Monitor. 02/24/2016 Refilled: oxyCODONE 7.5/325mg  one tablet every 4 hours as needed #100 and Oxycontin 30 mg one tablet every 12 hours #60.  We will continue the opioid monitoring program, this consists of regular clinic visits, examinations, urine drug screen, pill counts as well as use of [place] Controlled Substance Reporting System. 2. Lumbar Radiculopathy: Continue Lyrica. 02/23/2017 3. Insomnia: ContinueTrazodone.02/23/2017 4.  Nausea: Continue Zofran as needed. 02/23/2017  20 minutes of face to face patient care time was spent during this visit. All questions were encouraged and answered.  F/U in 1 month

## 2017-03-03 ENCOUNTER — Other Ambulatory Visit: Payer: Self-pay | Admitting: *Deleted

## 2017-03-03 ENCOUNTER — Other Ambulatory Visit: Payer: Self-pay | Admitting: Registered Nurse

## 2017-03-03 DIAGNOSIS — R634 Abnormal weight loss: Secondary | ICD-10-CM

## 2017-03-03 DIAGNOSIS — C090 Malignant neoplasm of tonsillar fossa: Secondary | ICD-10-CM

## 2017-03-03 MED ORDER — OXANDROLONE 10 MG PO TABS: 10.0000 mg | ORAL_TABLET | Freq: Every day | ORAL | 2 refills | Status: DC

## 2017-03-20 ENCOUNTER — Encounter: Payer: Self-pay | Admitting: Registered Nurse

## 2017-03-20 ENCOUNTER — Encounter: Payer: Medicare Other | Attending: Physical Medicine and Rehabilitation | Admitting: Registered Nurse

## 2017-03-20 ENCOUNTER — Other Ambulatory Visit: Payer: Self-pay

## 2017-03-20 VITALS — BP 126/67 | HR 67 | Wt 122.8 lb

## 2017-03-20 DIAGNOSIS — M542 Cervicalgia: Secondary | ICD-10-CM | POA: Diagnosis not present

## 2017-03-20 DIAGNOSIS — M5412 Radiculopathy, cervical region: Secondary | ICD-10-CM | POA: Diagnosis not present

## 2017-03-20 DIAGNOSIS — M961 Postlaminectomy syndrome, not elsewhere classified: Secondary | ICD-10-CM | POA: Insufficient documentation

## 2017-03-20 DIAGNOSIS — M47812 Spondylosis without myelopathy or radiculopathy, cervical region: Secondary | ICD-10-CM

## 2017-03-20 DIAGNOSIS — Z9889 Other specified postprocedural states: Secondary | ICD-10-CM | POA: Diagnosis not present

## 2017-03-20 DIAGNOSIS — G894 Chronic pain syndrome: Secondary | ICD-10-CM | POA: Diagnosis not present

## 2017-03-20 DIAGNOSIS — Z79899 Other long term (current) drug therapy: Secondary | ICD-10-CM | POA: Diagnosis not present

## 2017-03-20 DIAGNOSIS — Z5181 Encounter for therapeutic drug level monitoring: Secondary | ICD-10-CM | POA: Diagnosis not present

## 2017-03-20 DIAGNOSIS — M5416 Radiculopathy, lumbar region: Secondary | ICD-10-CM | POA: Insufficient documentation

## 2017-03-20 MED ORDER — OXYCODONE-ACETAMINOPHEN 7.5-325 MG PO TABS: 1.0000 | ORAL_TABLET | ORAL | 0 refills | Status: DC | PRN

## 2017-03-20 MED ORDER — OXYCODONE HCL ER 30 MG PO T12A: 30.0000 mg | EXTENDED_RELEASE_TABLET | Freq: Two times a day (BID) | ORAL | 0 refills | Status: DC

## 2017-03-20 NOTE — Progress Notes (Signed)
Subjective:    Patient ID: Bruce Olson, male    DOB: [birth year], 62 y.o.   MRN: [id number]  HPI: Mr. TAMER BAUGHMAN is a 62 year old male who returns for follow up appointmentfor chronic pain and medication refill.He states his pain is located in his neck radiating into his right shoulder and lower back pain. He rates his pain 7. His current exercise regime is walking. \  Mr. Loretto Morphine equivalent is 132.00 MME.  Mr. Shammas reports  his appetite is improving  his Oncologist is following, he's gain 1.8 lbs this month. .   Last UDS was performed on 11/21/2016, it was consistent.   Pain Inventory Average Pain 7 Pain Right Now 7 My pain is sharp, stabbing and tingling  In the last 24 hours, has pain interfered with the following? General activity 7 Relation with others 7 Enjoyment of life 7 What TIME of day is your pain at its worst? all Sleep (in general) Poor  Pain is worse with: walking, sitting and standing Pain improves with: heat/ice, medication and TENS Relief from Meds: 3  Mobility walk without assistance use a cane how many minutes can you walk? 5 ability to climb steps?  yes do you drive?  yes  Function disabled: date disabled 2012  Neuro/Psych weakness numbness tremor trouble walking  Prior Studies Any changes since last visit?  no  Physicians involved in your care Any changes since last visit?  no   Family History  Problem Relation Age of Onset  . Anesthesia problems Neg Hx   . Hypotension Neg Hx   . Malignant hyperthermia Neg Hx   . Pseudochol deficiency Neg Hx    Social History   Socioeconomic History  . Marital status: Single    Spouse name: None  . Number of children: None  . Years of education: None  . Highest education level: None  Social Needs  . Financial resource strain: None  . Food insecurity - worry: None  . Food insecurity - inability: None  . Transportation needs - medical: None  .  Transportation needs - non-medical: None  Occupational History  . None  Tobacco Use  . Smoking status: Current Every Day Smoker    Packs/day: 1.00    Years: 33.00    Pack years: 33.00    Types: Cigarettes  . Smokeless tobacco: Never Used  Substance and Sexual Activity  . Alcohol use: No  . Drug use: No  . Sexual activity: No    Comment: back problems  Other Topics Concern  . None  Social History Narrative  . None   Past Surgical History:  Procedure Laterality Date  . BACK SURGERY  2013   Back surgery  (fusion)09/15/2010 & 2013  . BACK SURGERY  03-2013  . HARDWARE REMOVAL N/A 03/14/2013   Procedure: HARDWARE REMOVAL;  Surgeon: Eustace Moore, MD;  Location: [place] [facility];  Service: Neurosurgery;  Laterality: N/A;   Past Medical History:  Diagnosis Date  . Goals of care, counseling/discussion 04/07/2016  . Iron deficiency anemia due to chronic blood loss 10/21/2016  . 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 No pertinent past medical history   . Occupational injury 2013   resulted in back injury that led to surgery  . Thyroid cancer ([facility])    BP 126/67   Pulse 67   Wt 122 lb 12.8 oz (55.7 kg)   SpO2 92%   BMI 19.82 kg/m   Opioid Risk Score:  3 Fall Risk  Score:  `1  Depression screen PHQ 2/9  Depression screen [facility] 2/9 03/20/2017 11/21/2016 09/27/2016 08/25/2016 06/01/2016 08/26/2015 09/22/2014  Decreased Interest 0 0 0 0 0 0 0  Down, Depressed, Hopeless 0 0 0 0 0 0 0  PHQ - 2 Score 0 0 0 0 0 0 0  Altered sleeping - - - - - - -  Tired, decreased energy - - - - - - -  Change in appetite - - - - - - -  Feeling bad or failure about yourself  - - - - - - -  Trouble concentrating - - - - - - -  Moving slowly or fidgety/restless - - - - - - -  Suicidal thoughts - - - - - - -  PHQ-9 Score - - - - - - -     Review of Systems  Constitutional:       Needs to gain weight per onc  HENT: Negative.   Eyes: Negative.   Respiratory: Negative.   Cardiovascular: Negative.   Gastrointestinal:  Negative.   Endocrine: Negative.   Genitourinary: Negative.   Musculoskeletal: Positive for back pain, gait problem and myalgias.  Skin: Negative.   Allergic/Immunologic: Negative.   Neurological: Positive for tremors, weakness and numbness.  Hematological: Negative.   Psychiatric/Behavioral: Negative.   All other systems reviewed and are negative.      Objective:   Physical Exam  Constitutional: He is oriented to person, place, and time. He appears well-developed and well-nourished.  HENT:  Head: Normocephalic and atraumatic.  Neck: Normal range of motion. Neck supple.  Cervical Paraspinal Tenderness: C-5-C-6   Mainly Right Side  Cardiovascular: Normal rate and regular rhythm.  Pulmonary/Chest: Effort normal and breath sounds normal.  Musculoskeletal:  Normal Muscle Bulk and Muscle Testing Reveals:  Upper Extremities: Full ROM and Muscle Strength 5/5 Right AC Joint Tenderenss Thoracic Paraspinal Tenderness: T-1-T-3 Mainly Right Side Lumbar Hypersensitivity Lower Extremities: Full ROM and Muscle Strength 5/5 Arises from Table with ease Narrow Based Gait  Neurological: He is alert and oriented to person, place, and time.  Skin: Skin is warm and dry.  Psychiatric: He has a normal mood and affect.  Nursing note and vitals reviewed.         Assessment & Plan:  1. Lumbar postlaminectomy syndrome status post lumbar fusion x3: His most recent surgery was 03/14/2013. He had hardware removal and bone allograft waist and L2-3. Continue to Monitor. 03/20/2017 Refilled: oxyCODONE 7.5/325mg  one tablet every 4 hours as needed #100 and Oxycontin 30 mg one tablet every 12 hours #60.  We will continue the opioid monitoring program, this consists of regular clinic visits, examinations, urine drug screen, pill counts as well as use of [place] Controlled Substance Reporting System. 2. Lumbar Radiculopathy: Continue Lyrica. 03/20/2017 3. Insomnia: ContinueTrazodone.03/20/2017 4.  Nausea: Continue Zofran as needed. 03/20/2017 5. Cervical Spondylosis/ Cervicalgia/ Cervical Radiculitis: Continue Lyrica. 03/20/2017  20 minutes of face to face patient care time was spent during this visit. All questions were encouraged and answered.  F/U in 1 month

## 2017-03-23 ENCOUNTER — Other Ambulatory Visit: Payer: Self-pay

## 2017-03-23 ENCOUNTER — Other Ambulatory Visit: Payer: Self-pay | Admitting: Registered Nurse

## 2017-03-23 DIAGNOSIS — Z8719 Personal history of other diseases of the digestive system: Secondary | ICD-10-CM

## 2017-03-23 MED ORDER — METOCLOPRAMIDE HCL 10 MG PO TABS: ORAL_TABLET | ORAL | 2 refills | Status: DC

## 2017-03-31 ENCOUNTER — Other Ambulatory Visit: Payer: Self-pay | Admitting: *Deleted

## 2017-03-31 MED ORDER — MECLIZINE HCL 25 MG PO TABS: 25.0000 mg | ORAL_TABLET | Freq: Three times a day (TID) | ORAL | 0 refills | Status: DC | PRN

## 2017-03-31 MED ORDER — MIRTAZAPINE 7.5 MG PO TABS: 7.5000 mg | ORAL_TABLET | Freq: Every day | ORAL | 1 refills | Status: DC

## 2017-03-31 MED ORDER — MECLIZINE HCL 32 MG PO TABS: 32.0000 mg | ORAL_TABLET | Freq: Three times a day (TID) | ORAL | 1 refills | Status: DC | PRN

## 2017-04-03 ENCOUNTER — Other Ambulatory Visit: Payer: Self-pay | Admitting: *Deleted

## 2017-04-03 DIAGNOSIS — R634 Abnormal weight loss: Secondary | ICD-10-CM

## 2017-04-03 DIAGNOSIS — C090 Malignant neoplasm of tonsillar fossa: Secondary | ICD-10-CM

## 2017-04-03 MED ORDER — OXANDROLONE 10 MG PO TABS: 10.0000 mg | ORAL_TABLET | Freq: Every day | ORAL | 2 refills | Status: DC

## 2017-04-20 ENCOUNTER — Encounter: Payer: Medicare Other | Attending: Physical Medicine and Rehabilitation | Admitting: Registered Nurse

## 2017-04-20 ENCOUNTER — Other Ambulatory Visit: Payer: Self-pay

## 2017-04-20 ENCOUNTER — Encounter: Payer: Self-pay | Admitting: Registered Nurse

## 2017-04-20 VITALS — BP 151/78 | HR 72 | Wt 123.0 lb

## 2017-04-20 DIAGNOSIS — Z79899 Other long term (current) drug therapy: Secondary | ICD-10-CM | POA: Insufficient documentation

## 2017-04-20 DIAGNOSIS — M5412 Radiculopathy, cervical region: Secondary | ICD-10-CM | POA: Diagnosis not present

## 2017-04-20 DIAGNOSIS — Z9889 Other specified postprocedural states: Secondary | ICD-10-CM | POA: Insufficient documentation

## 2017-04-20 DIAGNOSIS — M5416 Radiculopathy, lumbar region: Secondary | ICD-10-CM | POA: Insufficient documentation

## 2017-04-20 DIAGNOSIS — M47812 Spondylosis without myelopathy or radiculopathy, cervical region: Secondary | ICD-10-CM | POA: Diagnosis not present

## 2017-04-20 DIAGNOSIS — Z5181 Encounter for therapeutic drug level monitoring: Secondary | ICD-10-CM | POA: Insufficient documentation

## 2017-04-20 DIAGNOSIS — G894 Chronic pain syndrome: Secondary | ICD-10-CM

## 2017-04-20 DIAGNOSIS — M542 Cervicalgia: Secondary | ICD-10-CM | POA: Insufficient documentation

## 2017-04-20 DIAGNOSIS — M961 Postlaminectomy syndrome, not elsewhere classified: Secondary | ICD-10-CM | POA: Insufficient documentation

## 2017-04-20 MED ORDER — OXYCODONE-ACETAMINOPHEN 7.5-325 MG PO TABS: 1.0000 | ORAL_TABLET | ORAL | 0 refills | Status: DC | PRN

## 2017-04-20 MED ORDER — OXYCODONE HCL ER 30 MG PO T12A: 30.0000 mg | EXTENDED_RELEASE_TABLET | Freq: Two times a day (BID) | ORAL | 0 refills | Status: DC

## 2017-04-20 NOTE — Progress Notes (Signed)
Subjective:    Patient ID: Bruce Olson, male    DOB: [birth year], 62 y.o.   MRN: [id number]  HPI: Bruce Olson is a 62 year old male who returns for follow up appointmentfor chronic pain and medication refill.He states his pain is located in his neck radiating into his right shoulder and mid-lower back. He rates his pain 8. His current exercise regime is walking.  Bruce Olson Morphine equivalent is 97.50  MME.  Bruce Olson reports  his appetite is improving  his Oncologist following, no weight gain this month. Reports no weight loss.  .   Last Oral Swab  was performed on 11/21/2016, it was consistent.   Pain Inventory Average Pain 8 Pain Right Now 8 My pain is sharp, stabbing and tingling  In the last 24 hours, has pain interfered with the following? General activity 8 Relation with others 8 Enjoyment of life 8 What TIME of day is your pain at its worst? all Sleep (in general) Poor  Pain is worse with: walking, sitting and standing Pain improves with: heat/ice, medication and TENS Relief from Meds: 2  Mobility walk without assistance use a cane how many minutes can you walk? 15 ability to climb steps?  yes do you drive?  yes  Function disabled: date disabled 05/2010  Neuro/Psych weakness numbness tremor trouble walking  Prior Studies Any changes since last visit?  no  Physicians involved in your care Any changes since last visit?  no   Family History  Problem Relation Age of Onset  . Anesthesia problems Neg Hx   . Hypotension Neg Hx   . Malignant hyperthermia Neg Hx   . Pseudochol deficiency Neg Hx    Social History   Socioeconomic History  . Marital status: Single    Spouse name: Not on file  . Number of children: Not on file  . Years of education: Not on file  . Highest education level: Not on file  Occupational History  . Not on file  Social Needs  . Financial resource strain: Not on file  . Food insecurity:   Worry: Not on file    Inability: Not on file  . Transportation needs:    Medical: Not on file    Non-medical: Not on file  Tobacco Use  . Smoking status: Current Every Day Smoker    Packs/day: 1.00    Years: 33.00    Pack years: 33.00    Types: Cigarettes  . Smokeless tobacco: Never Used  Substance and Sexual Activity  . Alcohol use: No  . Drug use: No  . Sexual activity: Never    Comment: back problems  Lifestyle  . Physical activity:    Days per week: Not on file    Minutes per session: Not on file  . Stress: Not on file  Relationships  . Social connections:    Talks on phone: Not on file    Gets together: Not on file    Attends religious service: Not on file    Active member of club or organization: Not on file    Attends meetings of clubs or organizations: Not on file    Relationship status: Not on file  Other Topics Concern  . Not on file  Social History Narrative  . Not on file   Past Surgical History:  Procedure Laterality Date  . BACK SURGERY  2013   Back surgery  (fusion)09/15/2010 & 2013  . BACK SURGERY  03-2013  . HARDWARE  REMOVAL N/A 03/14/2013   Procedure: HARDWARE REMOVAL;  Surgeon: Eustace Moore, MD;  Location: [place] [facility];  Service: Neurosurgery;  Laterality: N/A;   Past Medical History:  Diagnosis Date  . Goals of care, counseling/discussion 04/07/2016  . Iron deficiency anemia due to chronic blood loss 10/21/2016  . 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 No pertinent past medical history   . Occupational injury 2013   resulted in back injury that led to surgery  . Thyroid cancer ([facility])    BP (!) 151/78   Pulse 72   Wt 123 lb (55.8 kg)   SpO2 93%   BMI 19.85 kg/m   Opioid Risk Score:  3 Fall Risk Score:  `1  Depression screen PHQ 2/9  Depression screen [facility] 2/9 04/20/2017 03/20/2017 11/21/2016 09/27/2016 08/25/2016 06/01/2016 08/26/2015  Decreased Interest 0 0 0 0 0 0 0  Down, Depressed, Hopeless 0 0 0 0 0 0 0  PHQ - 2 Score 0 0 0 0 0 0 0  Altered  sleeping - - - - - - -  Tired, decreased energy - - - - - - -  Change in appetite - - - - - - -  Feeling bad or failure about yourself  - - - - - - -  Trouble concentrating - - - - - - -  Moving slowly or fidgety/restless - - - - - - -  Suicidal thoughts - - - - - - -  PHQ-9 Score - - - - - - -     Review of Systems  Constitutional:       Needs to gain weight per onc  HENT: Negative.   Eyes: Negative.   Respiratory: Negative.   Cardiovascular: Negative.   Gastrointestinal: Negative.   Endocrine: Negative.   Genitourinary: Negative.   Musculoskeletal: Positive for back pain, gait problem and myalgias.  Skin: Negative.   Allergic/Immunologic: Negative.   Neurological: Positive for tremors, weakness and numbness.  Hematological: Negative.   Psychiatric/Behavioral: Negative.   All other systems reviewed and are negative.      Objective:   Physical Exam  Constitutional: He is oriented to person, place, and time. He appears well-developed and well-nourished.  HENT:  Head: Normocephalic and atraumatic.  Neck: Normal range of motion. Neck supple.  Cervical Paraspinal Tenderness: C-5-C-6  Mainly Right Side  Cardiovascular: Normal rate and regular rhythm.  Pulmonary/Chest: Effort normal and breath sounds normal.  Musculoskeletal:  Normal Muscle Bulk and Muscle Testing Reveals:  Upper Extremities: Full ROM and Muscle Strength 5/5 Right AC Joint Tenderenss Thoracic Paraspinal Tenderness:  T-1-T-3  Mainly Right Side Lumbar Hypersensitivity Lower Extremities: Full ROM and Muscle Strength 5/5 Arises from Table with ease Narrow Based Gait  Neurological: He is alert and oriented to person, place, and time.  Skin: Skin is warm and dry.  Psychiatric: He has a normal mood and affect.  Nursing note and vitals reviewed.         Assessment & Plan:  1. Lumbar postlaminectomy syndrome status post lumbar fusion x3: His most recent surgery was 03/14/2013. He had hardware removal and  bone allograft waist and L2-3. Continue to Monitor. 04/20/2017 Refilled: oxyCODONE 7.5/325mg  one tablet every 4 hours as needed #100 and Oxycontin 30 mg one tablet every 12 hours #60.  We will continue the opioid monitoring program, this consists of regular clinic visits, examinations, urine drug screen, pill counts as well as use of [place] Controlled Substance Reporting System. 2. Lumbar Radiculopathy: Continue Lyrica.  04/20/2017 3. Insomnia: ContinueTrazodone.04/20/2017 4. Nausea: Continue Zofran as needed. 04/20/2017 5. Cervical Spondylosis/ Cervicalgia/ Cervical Radiculitis: Continue Lyrica. 04/20/2017  20 minutes of face to face patient care time was spent during this visit. All questions were encouraged and answered.  F/U in 1 month

## 2017-04-21 ENCOUNTER — Encounter: Payer: Medicare Other | Admitting: Registered Nurse

## 2017-05-03 ENCOUNTER — Other Ambulatory Visit: Payer: Self-pay | Admitting: *Deleted

## 2017-05-03 MED ORDER — MIRTAZAPINE 7.5 MG PO TABS: 7.5000 mg | ORAL_TABLET | Freq: Every day | ORAL | 1 refills | Status: DC

## 2017-05-09 ENCOUNTER — Other Ambulatory Visit: Payer: Self-pay

## 2017-05-09 ENCOUNTER — Ambulatory Visit: Payer: Self-pay | Admitting: Hematology & Oncology

## 2017-05-09 ENCOUNTER — Inpatient Hospital Stay (HOSPITAL_BASED_OUTPATIENT_CLINIC_OR_DEPARTMENT_OTHER): Payer: Medicare Other | Admitting: Hematology & Oncology

## 2017-05-09 ENCOUNTER — Inpatient Hospital Stay: Payer: Medicare Other | Attending: Hematology & Oncology

## 2017-05-09 VITALS — BP 128/66 | HR 65 | Temp 98.1°F | Resp 18

## 2017-05-09 DIAGNOSIS — R52 Pain, unspecified: Secondary | ICD-10-CM | POA: Diagnosis not present

## 2017-05-09 DIAGNOSIS — Z923 Personal history of irradiation: Secondary | ICD-10-CM | POA: Insufficient documentation

## 2017-05-09 DIAGNOSIS — C099 Malignant neoplasm of tonsil, unspecified: Secondary | ICD-10-CM

## 2017-05-09 DIAGNOSIS — Z9221 Personal history of antineoplastic chemotherapy: Secondary | ICD-10-CM

## 2017-05-09 DIAGNOSIS — C090 Malignant neoplasm of tonsillar fossa: Secondary | ICD-10-CM

## 2017-05-09 DIAGNOSIS — Z79899 Other long term (current) drug therapy: Secondary | ICD-10-CM | POA: Insufficient documentation

## 2017-05-09 DIAGNOSIS — E034 Atrophy of thyroid (acquired): Secondary | ICD-10-CM

## 2017-05-09 DIAGNOSIS — D500 Iron deficiency anemia secondary to blood loss (chronic): Secondary | ICD-10-CM

## 2017-05-09 DIAGNOSIS — Z9484 Stem cells transplant status: Secondary | ICD-10-CM

## 2017-05-09 DIAGNOSIS — R8581 Anal high risk human papillomavirus (HPV) DNA test positive: Secondary | ICD-10-CM | POA: Diagnosis not present

## 2017-05-09 LAB — CBC WITH DIFFERENTIAL (CANCER CENTER ONLY)
BASOS PCT: 1 %
Basophils Absolute: 0 10*3/uL (ref 0.0–0.1)
EOS ABS: 0.1 10*3/uL (ref 0.0–0.5)
Eosinophils Relative: 3 %
HCT: 33.2 % — ABNORMAL LOW (ref 38.7–49.9)
HEMOGLOBIN: 11.5 g/dL — AB (ref 13.0–17.1)
Lymphocytes Relative: 30 %
Lymphs Abs: 1.3 10*3/uL (ref 0.9–3.3)
MCH: 30.3 pg (ref 28.0–33.4)
MCHC: 34.6 g/dL (ref 32.0–35.9)
MCV: 87.4 fL (ref 82.0–98.0)
MONOS PCT: 15 %
Monocytes Absolute: 0.7 10*3/uL (ref 0.1–0.9)
NEUTROS PCT: 51 %
Neutro Abs: 2.3 10*3/uL (ref 1.5–6.5)
Platelet Count: 264 10*3/uL (ref 145–400)
RBC: 3.8 MIL/uL — ABNORMAL LOW (ref 4.20–5.70)
RDW: 13.5 % (ref 11.1–15.7)
WBC Count: 4.4 10*3/uL (ref 4.0–10.0)

## 2017-05-09 LAB — CMP (CANCER CENTER ONLY)
ALBUMIN: 3.6 g/dL (ref 3.5–5.0)
ALK PHOS: 52 U/L (ref 26–84)
ALT: 28 U/L (ref 10–47)
AST: 29 U/L (ref 11–38)
Anion gap: 9 (ref 5–15)
BILIRUBIN TOTAL: 0.4 mg/dL (ref 0.2–1.6)
BUN: 16 mg/dL (ref 7–22)
CALCIUM: 9.2 mg/dL (ref 8.0–10.3)
CO2: 31 mmol/L (ref 18–33)
CREATININE: 1.3 mg/dL — AB (ref 0.60–1.20)
Chloride: 97 mmol/L — ABNORMAL LOW (ref 98–108)
Glucose, Bld: 78 mg/dL (ref 73–118)
Potassium: 4.9 mmol/L — ABNORMAL HIGH (ref 3.3–4.7)
Sodium: 137 mmol/L (ref 128–145)
Total Protein: 6.8 g/dL (ref 6.4–8.1)

## 2017-05-09 NOTE — Progress Notes (Signed)
Hematology and Oncology Follow Up Visit  Bruce Olson [id number] 05-28-1955 62 y.o. 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 is here today for follow-up.  He is looking a little bit better.  We last saw him 3 months ago.  He got through the winter time without any problems.  He is really had no specific complaints.  He has had no nausea or vomiting.  He does use a scopolamine patch.  This has helped him with nausea and vomiting.  He has had no bleeding.  His pain is controlled with OxyContin and oxycodone.  He does see a pain management doctor monthly.  He has had no rashes.  It has been over a year that we have checked for any recurrence of his disease.  I think it would be worthwhile checking him with a PET scan.  He is at risk for recurrence.   ECOG Performance Status: 1 - Symptomatic but completely ambulatory  Medications:  Allergies as of 05/09/2017   No Known Allergies     Medication List        Accurate as of 05/09/17  2:14 PM. Always use your most recent med list.          docusate sodium 100 MG capsule Commonly known as:  COLACE Take 100 mg by mouth daily.   meclizine 25 MG tablet Commonly known as:  ANTIVERT Take 1 tablet (25 mg total) by mouth 3 (three) times daily as needed.   metoCLOPramide 10 MG tablet Commonly known as:  REGLAN TAKE 1 TABLET BY MOUTH 4 TIMES DAILY BEFORE MEALS AND AT BEDTIME   mirtazapine 7.5 MG tablet Commonly known as:  REMERON Take 1 tablet (7.5 mg total) by mouth at bedtime.   multivitamin with minerals Tabs tablet Take 1 tablet by mouth daily.   oxandrolone 10 MG tablet Commonly known as:  OXANDRIN Take 1 tablet (10 mg total) by mouth daily.   oxyCODONE 30 MG 12 hr tablet Commonly known as:  OXYCONTIN Take 1  tablet (30 mg total) by mouth 2 (two) times daily.   oxyCODONE-acetaminophen 7.5-325 MG tablet Commonly known as:  PERCOCET Take 1 tablet by mouth every 4 (four) hours as needed. May take one extra tablet when pain is sever. No more than 4 a day   pantoprazole 40 MG tablet Commonly known as:  PROTONIX Take 1 tablet (40 mg total) by mouth 2 (two) times daily before a meal.   pilocarpine 5 MG tablet Commonly known as:  SALAGEN Take 1 tablet (5 mg total) by mouth 2 (two) times daily.   pregabalin 100 MG capsule Commonly known as:  LYRICA Take 1 capsule (100 mg total) by mouth 3 (three) times daily.   scopolamine 1 MG/3DAYS Commonly known as:  TRANSDERM-SCOP Place 1 patch (1.5 mg total) onto the skin every 3 (three) days.   sucralfate 1 GM/10ML suspension Commonly known as:  CARAFATE Take 10 mLs (1 g total) by mouth 4 (four) times daily -  with meals and at bedtime.       Allergies: No Known Allergies  Past Medical History, Surgical history, Social history, and Family History were reviewed and updated.  Review of Systems: Review of Systems  All other systems reviewed and are negative.   Physical Exam:  oral temperature  is 98.1 F (36.7 C). His blood pressure is 128/66 and his pulse is 65. His respiration is 18 and oxygen saturation is 100%.   Wt Readings from Last 3 Encounters:  04/20/17 123 lb (55.8 kg)  03/20/17 122 lb 12.8 oz (55.7 kg)  02/09/17 121 lb (54.9 kg)    Physical Exam  Constitutional: He is oriented to person, place, and time.  HENT:  Head: Normocephalic and atraumatic.  Mouth/Throat: Oropharynx is clear and moist.  Eyes: Pupils are equal, round, and reactive to light. EOM are normal.  Neck: Normal range of motion.  Cardiovascular: Normal rate, regular rhythm and normal heart sounds.  Pulmonary/Chest: Effort normal and breath sounds normal.  Abdominal: Soft. Bowel sounds are normal.  Musculoskeletal: Normal range of motion. He exhibits no edema,  tenderness or deformity.  Lymphadenopathy:    He has no cervical adenopathy.  Neurological: He is alert and oriented to person, place, and time.  Skin: Skin is warm and dry. No rash noted. No erythema.  Psychiatric: He has a normal mood and affect. His behavior is normal. Judgment and thought content normal.  Vitals reviewed.    Lab Results  Component Value Date   WBC 4.4 05/09/2017   HGB 10.9 (L) 01/20/2017   HCT 33.2 (L) 05/09/2017   MCV 87.4 05/09/2017   PLT 264 05/09/2017   Lab Results  Component Value Date   FERRITIN 360 (H) 01/20/2017   IRON 60 01/20/2017   TIBC 319 01/20/2017   UIBC 258 01/20/2017   IRONPCTSAT 19 (L) 01/20/2017   Lab Results  Component Value Date   RBC 3.80 (L) 05/09/2017   No results found for: KPAFRELGTCHN, LAMBDASER, KAPLAMBRATIO No results found for: Kandis Cocking, IGMSERUM No results found for: Odetta Pink, SPEI   Chemistry      Component Value Date/Time   NA 137 05/09/2017 1301   NA 138 01/20/2017 1324   NA 135 (L) 04/05/2016 1333   K 4.9 (H) 05/09/2017 1301   K 4.5 01/20/2017 1324   K 4.7 04/05/2016 1333   CL 97 (L) 05/09/2017 1301   CL 97 (L) 01/20/2017 1324   CO2 31 05/09/2017 1301   CO2 30 01/20/2017 1324   CO2 29 04/05/2016 1333   BUN 16 05/09/2017 1301   BUN 15 01/20/2017 1324   BUN 23.6 04/05/2016 1333   CREATININE 1.30 (H) 05/09/2017 1301   CREATININE 1.2 01/20/2017 1324   CREATININE 1.1 04/05/2016 1333      Component Value Date/Time   CALCIUM 9.2 05/09/2017 1301   CALCIUM 9.2 01/20/2017 1324   CALCIUM 9.2 04/05/2016 1333   ALKPHOS 52 05/09/2017 1301   ALKPHOS 51 01/20/2017 1324   ALKPHOS 67 04/05/2016 1333   AST 29 05/09/2017 1301   AST 17 04/05/2016 1333   ALT 28 05/09/2017 1301   ALT 26 01/20/2017 1324   ALT 10 04/05/2016 1333   BILITOT 0.4 05/09/2017 1301   BILITOT 0.30 04/05/2016 1333      Impression and Plan: [name] is a very pleasant 62 yo  caucasian gentleman with history of squamous cell carcinoma of the left tonsil, HPV positive.  Again, he actually looks pretty good.  I think would be a good idea to recheck him.  Again, with tonsillar cancer, there is always a risk of recurrence.  We will get the PET scan set up for 1 month.  I will see him back in 3 months.   [name], MD  4/9/20192:14 PM

## 2017-05-18 ENCOUNTER — Ambulatory Visit: Payer: Self-pay | Admitting: Physical Medicine & Rehabilitation

## 2017-05-18 ENCOUNTER — Encounter: Payer: Self-pay | Admitting: Registered Nurse

## 2017-05-18 ENCOUNTER — Encounter: Payer: Medicare Other | Attending: Physical Medicine and Rehabilitation | Admitting: Registered Nurse

## 2017-05-18 VITALS — BP 143/75 | HR 71 | Ht 66.0 in | Wt 122.4 lb

## 2017-05-18 DIAGNOSIS — M5412 Radiculopathy, cervical region: Secondary | ICD-10-CM

## 2017-05-18 DIAGNOSIS — Z5181 Encounter for therapeutic drug level monitoring: Secondary | ICD-10-CM | POA: Diagnosis not present

## 2017-05-18 DIAGNOSIS — Z9889 Other specified postprocedural states: Secondary | ICD-10-CM | POA: Diagnosis not present

## 2017-05-18 DIAGNOSIS — Z79899 Other long term (current) drug therapy: Secondary | ICD-10-CM | POA: Insufficient documentation

## 2017-05-18 DIAGNOSIS — M542 Cervicalgia: Secondary | ICD-10-CM | POA: Diagnosis not present

## 2017-05-18 DIAGNOSIS — M7918 Myalgia, other site: Secondary | ICD-10-CM

## 2017-05-18 DIAGNOSIS — M961 Postlaminectomy syndrome, not elsewhere classified: Secondary | ICD-10-CM | POA: Diagnosis not present

## 2017-05-18 DIAGNOSIS — M5416 Radiculopathy, lumbar region: Secondary | ICD-10-CM | POA: Diagnosis not present

## 2017-05-18 DIAGNOSIS — G894 Chronic pain syndrome: Secondary | ICD-10-CM

## 2017-05-18 DIAGNOSIS — M47812 Spondylosis without myelopathy or radiculopathy, cervical region: Secondary | ICD-10-CM | POA: Diagnosis not present

## 2017-05-18 MED ORDER — METHOCARBAMOL 500 MG PO TABS: 500.0000 mg | ORAL_TABLET | Freq: Two times a day (BID) | ORAL | 2 refills | Status: DC | PRN

## 2017-05-18 NOTE — Progress Notes (Signed)
Subjective:    Patient ID: Bruce Olson, male    DOB: 18-Feb-1955, 62 y.o.   MRN: [id number]  HPI: [name] is a 62 year old male who returns for follow up appointment for chronic pain and medication refill. He states his pain is located in his neck radiating into his right shoulder and upper-lower back pain. He rates his pain 7. His current exercise regime is walking.   [name] Morphine Equivalent is 124.50 MME.   Last Oral Swab was Performed on 11/21/2016, it was consistent.   Pain Inventory Average Pain 7 Pain Right Now 7 My pain is sharp, stabbing and tingling  In the last 24 hours, has pain interfered with the following? General activity 7 Relation with others 7 Enjoyment of life 7 What TIME of day is your pain at its worst? all Sleep (in general) Poor  Pain is worse with: walking, sitting and standing Pain improves with: heat/ice, medication and TENS Relief from Meds: 3  Mobility walk without assistance use a cane ability to climb steps?  yes do you drive?  yes  Function disabled: date disabled 2016  Neuro/Psych weakness numbness tremor trouble walking  Prior Studies Any changes since last visit?  no  Physicians involved in your care Any changes since last visit?  no   Family History  Problem Relation Age of Onset  . Anesthesia problems Neg Hx   . Hypotension Neg Hx   . Malignant hyperthermia Neg Hx   . Pseudochol deficiency Neg Hx    Social History   Socioeconomic History  . Marital status: Single    Spouse name: Not on file  . Number of children: Not on file  . Years of education: Not on file  . Highest education level: Not on file  Occupational History  . Not on file  Social Needs  . Financial resource strain: Not on file  . Food insecurity:    Worry: Not on file    Inability: Not on file  . Transportation needs:    Medical: Not on file    Non-medical: Not on file  Tobacco Use  . Smoking status: Current  Every Day Smoker    Packs/day: 1.00    Years: 33.00    Pack years: 33.00    Types: Cigarettes  . Smokeless tobacco: Never Used  Substance and Sexual Activity  . Alcohol use: No  . Drug use: No  . Sexual activity: Never    Comment: back problems  Lifestyle  . Physical activity:    Days per week: Not on file    Minutes per session: Not on file  . Stress: Not on file  Relationships  . Social connections:    Talks on phone: Not on file    Gets together: Not on file    Attends religious service: Not on file    Active member of club or organization: Not on file    Attends meetings of clubs or organizations: Not on file    Relationship status: Not on file  Other Topics Concern  . Not on file  Social History Narrative  . Not on file   Past Surgical History:  Procedure Laterality Date  . BACK SURGERY  2013   Back surgery  (fusion)09/15/2010 & 2013  . BACK SURGERY  03-2013  . HARDWARE REMOVAL N/A 03/14/2013   Procedure: HARDWARE REMOVAL;  Surgeon: Eustace Moore, MD;  Location: [place] [facility];  Service: Neurosurgery;  Laterality: N/A;  Past Medical History:  Diagnosis Date  . Goals of care, counseling/discussion 04/07/2016  . Iron deficiency anemia due to chronic blood loss 10/21/2016  . 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 No pertinent past medical history   . Occupational injury 2013   resulted in back injury that led to surgery  . Thyroid cancer (HCC)    BP (!) 143/75 (BP Location: Left Arm, Patient Position: Sitting, Cuff Size: Normal)   Pulse 71   Ht 5\' 6"  (1.676 m)   Wt 122 lb 6.4 oz (55.5 kg)   SpO2 94%   BMI 19.76 kg/m   Opioid Risk Score:   Fall Risk Score:  `1  Depression screen PHQ 2/9  Depression screen [facility] 2/9 04/20/2017 03/20/2017 11/21/2016 09/27/2016 08/25/2016 06/01/2016 08/26/2015  Decreased Interest 0 0 0 0 0 0 0  Down, Depressed, Hopeless 0 0 0 0 0 0 0  PHQ - 2 Score 0 0 0 0 0 0 0  Altered sleeping - - - - - - -  Tired, decreased energy - - - - - - -  Change in  appetite - - - - - - -  Feeling bad or failure about yourself  - - - - - - -  Trouble concentrating - - - - - - -  Moving slowly or fidgety/restless - - - - - - -  Suicidal thoughts - - - - - - -  PHQ-9 Score - - - - - - -     Review of Systems  Constitutional: Negative.   HENT: Negative.   Eyes: Negative.   Respiratory: Negative.   Cardiovascular: Negative.   Gastrointestinal: Negative.   Endocrine: Negative.   Genitourinary: Negative.   Musculoskeletal: Positive for arthralgias, back pain, gait problem and myalgias.  Skin: Negative.   Allergic/Immunologic: Negative.   Neurological: Positive for tremors, weakness and numbness.  Hematological: Negative.   Psychiatric/Behavioral: Negative.   All other systems reviewed and are negative.      Objective:   Physical Exam  Constitutional: He is oriented to person, place, and time. He appears well-developed and well-nourished.  HENT:  Head: Normocephalic and atraumatic.  Neck: Normal range of motion. Neck supple.  Cervical Paraspinal Tenderness: C-5-C-6  Cardiovascular: Normal rate and regular rhythm.  Pulmonary/Chest: Effort normal and breath sounds normal.  Musculoskeletal:  Normal Muscle Bulk and Muscle Testing Reveals:  Upper Extremities: Full ROM and Muscle Strength 5/5 Left AC Joint Tenderness Thoracic Paraspinal Tenderness: T-1-T-3 Mainly Left Side Lumbar Hypersensitivity Lower Extremities: Full ROM and Muscle Strength 5/5 Arises from Table Slowly using straight cane for support Antalgic Gait  Neurological: He is alert and oriented to person, place, and time.  Skin: Skin is warm and dry.  Psychiatric: He has a normal mood and affect.  Nursing note and vitals reviewed.         Assessment & Plan:  1. Lumbar postlaminectomy syndrome status post lumbar fusion x3: His most recent surgery was 03/14/2013. He had hardware removal and bone allograft waist and L2-3. Continue to Monitor. Continue current Medication  Regimen.05/18/2017 Refilled: oxyCODONE 7.5/325mg  one tablet every 4 hours as needed #100 and Oxycontin 30 mg one tablet every 12 hours #60.  We will continue the opioid monitoring program, this consists of regular clinic visits, examinations, urine drug screen, pill counts as well as use of [place] Controlled Substance Reporting System. 2. Lumbar Radiculopathy: Continue current medication regimen with Lyrica. 05/18/2017 3. Insomnia: Continue current medication regimen withTrazodone.05/18/2017 4. Nausea: Continue Zofran as  needed. 05/18/2017 5. Cervical Spondylosis/ Cervicalgia/ Cervical Radiculitis: Continue current medication regimen with  Lyrica. 05/18/2017  20 minutes of face to face patient care time was spent during this visit. All questions were encouraged and answered.  F/U in 1 month

## 2017-05-31 ENCOUNTER — Ambulatory Visit (HOSPITAL_COMMUNITY)
Admission: RE | Admit: 2017-05-31 | Discharge: 2017-05-31 | Disposition: A | Discharge status: Home / Self Care | Payer: Medicare Other | Source: Ambulatory Visit | Attending: Hematology & Oncology | Admitting: Hematology & Oncology

## 2017-05-31 DIAGNOSIS — C090 Malignant neoplasm of tonsillar fossa: Secondary | ICD-10-CM | POA: Diagnosis not present

## 2017-05-31 DIAGNOSIS — C099 Malignant neoplasm of tonsil, unspecified: Secondary | ICD-10-CM | POA: Diagnosis not present

## 2017-05-31 LAB — GLUCOSE, CAPILLARY: GLUCOSE-CAPILLARY: 87 mg/dL (ref 65–99)

## 2017-05-31 MED ORDER — FLUDEOXYGLUCOSE F - 18 (FDG) INJECTION
6.8000 | Freq: Once | INTRAVENOUS | Status: AC | PRN
  Administered 2017-05-31: 6.8 via INTRAVENOUS

## 2017-06-01 ENCOUNTER — Telehealth: Payer: Self-pay | Admitting: *Deleted

## 2017-06-01 NOTE — Telephone Encounter (Addendum)
Patient is aware of results  ----- Message from [name], MD sent at 05/31/2017  5:15 PM EDT ----- Call - NO cancer in the body!!  [name]

## 2017-06-02 ENCOUNTER — Telehealth: Payer: Self-pay

## 2017-06-02 ENCOUNTER — Other Ambulatory Visit: Payer: Self-pay | Admitting: Registered Nurse

## 2017-06-02 MED ORDER — OXYCODONE-ACETAMINOPHEN 7.5-325 MG PO TABS: 1.0000 | ORAL_TABLET | ORAL | 0 refills | Status: DC | PRN

## 2017-06-02 MED ORDER — OXYCODONE HCL ER 30 MG PO T12A: 30.0000 mg | EXTENDED_RELEASE_TABLET | Freq: Two times a day (BID) | ORAL | 0 refills | Status: DC

## 2017-06-02 NOTE — Telephone Encounter (Signed)
Needs refill on both opioids

## 2017-06-02 NOTE — Telephone Encounter (Signed)
Oxycontin and Oxycodone was e-scribe. No prescription was given on 05/18/2017. Bruce Olson was calle regarding the above. According to the PMP Aware Web-site last pickup was on 05/03/2017.

## 2017-06-07 ENCOUNTER — Other Ambulatory Visit: Payer: Self-pay | Admitting: *Deleted

## 2017-06-07 MED ORDER — MIRTAZAPINE 7.5 MG PO TABS: 7.5000 mg | ORAL_TABLET | Freq: Every day | ORAL | 1 refills | Status: DC

## 2017-06-22 ENCOUNTER — Encounter: Payer: Self-pay | Admitting: Registered Nurse

## 2017-06-22 ENCOUNTER — Encounter: Payer: Medicare Other | Attending: Physical Medicine and Rehabilitation | Admitting: Registered Nurse

## 2017-06-22 VITALS — BP 138/79 | HR 60 | Resp 14 | Ht 65.5 in | Wt 122.0 lb

## 2017-06-22 DIAGNOSIS — M47812 Spondylosis without myelopathy or radiculopathy, cervical region: Secondary | ICD-10-CM | POA: Diagnosis not present

## 2017-06-22 DIAGNOSIS — Z9889 Other specified postprocedural states: Secondary | ICD-10-CM | POA: Insufficient documentation

## 2017-06-22 DIAGNOSIS — Z79899 Other long term (current) drug therapy: Secondary | ICD-10-CM | POA: Diagnosis not present

## 2017-06-22 DIAGNOSIS — M961 Postlaminectomy syndrome, not elsewhere classified: Secondary | ICD-10-CM | POA: Diagnosis not present

## 2017-06-22 DIAGNOSIS — G894 Chronic pain syndrome: Secondary | ICD-10-CM | POA: Diagnosis not present

## 2017-06-22 DIAGNOSIS — M5412 Radiculopathy, cervical region: Secondary | ICD-10-CM | POA: Diagnosis not present

## 2017-06-22 DIAGNOSIS — M7918 Myalgia, other site: Secondary | ICD-10-CM

## 2017-06-22 DIAGNOSIS — M5416 Radiculopathy, lumbar region: Secondary | ICD-10-CM | POA: Insufficient documentation

## 2017-06-22 DIAGNOSIS — Z5181 Encounter for therapeutic drug level monitoring: Secondary | ICD-10-CM | POA: Insufficient documentation

## 2017-06-22 DIAGNOSIS — Z79891 Long term (current) use of opiate analgesic: Secondary | ICD-10-CM

## 2017-06-22 DIAGNOSIS — M542 Cervicalgia: Secondary | ICD-10-CM | POA: Insufficient documentation

## 2017-06-22 MED ORDER — OXYCODONE-ACETAMINOPHEN 7.5-325 MG PO TABS: 1.0000 | ORAL_TABLET | ORAL | 0 refills | Status: DC | PRN

## 2017-06-22 MED ORDER — OXYCODONE HCL ER 30 MG PO T12A: 30.0000 mg | EXTENDED_RELEASE_TABLET | Freq: Two times a day (BID) | ORAL | 0 refills | Status: DC

## 2017-06-22 NOTE — Progress Notes (Signed)
Subjective:    Patient ID: Bruce Olson, male    DOB: [birth year], 62 y.o.   MRN: [id number]  HPI: [name] is a 62 year old male who returns for follow up appointment for chronic pain and medication refill. He states his pain is located in his neck radiating into his right shoulder and lower back radiating into his right lower extremity. He rates his pain 7. His current exercise regime is walking.  Bruce Olson reports he was experiencing increase frequency of nausea and vomiting, his oncologist prescribed scopolamine patch.   Bruce Olson Morphine Equivalent is 97.50 MME.  Last Oral Swab was Performed on 11/21/2016, Oral Swab Performed today.    Pain Inventory Average Pain 7 Pain Right Now 7 My pain is sharp, stabbing and tingling  In the last 24 hours, has pain interfered with the following? General activity 7 Relation with others 7 Enjoyment of life 7 What TIME of day is your pain at its worst? all Sleep (in general) Poor  Pain is worse with: walking, sitting and standing Pain improves with: heat/ice, medication and TENS Relief from Meds: 3  Mobility walk without assistance use a cane ability to climb steps?  yes do you drive?  yes  Function disabled: date disabled .  Neuro/Psych weakness numbness tremor trouble walking  Prior Studies Any changes since last visit?  no  Physicians involved in your care Any changes since last visit?  no   Family History  Problem Relation Age of Onset  . Anesthesia problems Neg Hx   . Hypotension Neg Hx   . Malignant hyperthermia Neg Hx   . Pseudochol deficiency Neg Hx    Social History   Socioeconomic History  . Marital status: Single    Spouse name: Not on file  . Number of children: Not on file  . Years of education: Not on file  . Highest education level: Not on file  Occupational History  . Not on file  Social Needs  . Financial resource strain: Not on file  . Food insecurity:   Worry: Not on file    Inability: Not on file  . Transportation needs:    Medical: Not on file    Non-medical: Not on file  Tobacco Use  . Smoking status: Current Every Day Smoker    Packs/day: 1.00    Years: 33.00    Pack years: 33.00    Types: Cigarettes  . Smokeless tobacco: Never Used  Substance and Sexual Activity  . Alcohol use: No  . Drug use: No  . Sexual activity: Never    Comment: back problems  Lifestyle  . Physical activity:    Days per week: Not on file    Minutes per session: Not on file  . Stress: Not on file  Relationships  . Social connections:    Talks on phone: Not on file    Gets together: Not on file    Attends religious service: Not on file    Active member of club or organization: Not on file    Attends meetings of clubs or organizations: Not on file    Relationship status: Not on file  Other Topics Concern  . Not on file  Social History Narrative  . Not on file   Past Surgical History:  Procedure Laterality Date  . BACK SURGERY  2013   Back surgery  (fusion)09/15/2010 & 2013  . BACK SURGERY  03-2013  . HARDWARE REMOVAL N/A 03/14/2013  Procedure: HARDWARE REMOVAL;  Surgeon: Eustace Moore, MD;  Location: [place] [facility];  Service: Neurosurgery;  Laterality: N/A;   Past Medical History:  Diagnosis Date  . Goals of care, counseling/discussion 04/07/2016  . Iron deficiency anemia due to chronic blood loss 10/21/2016  . 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 No pertinent past medical history   . Occupational injury 2013   resulted in back injury that led to surgery  . Thyroid cancer (HCC)    BP 138/79 (BP Location: Right Arm, Patient Position: Sitting, Cuff Size: Normal)   Pulse (!) 58   Resp 14   Ht 5' 5.5" (1.664 m)   Wt 122 lb (55.3 kg)   SpO2 94%   BMI 19.99 kg/m   Opioid Risk Score:   Fall Risk Score:  `1  Depression screen PHQ 2/9  Depression screen [facility] 2/9 04/20/2017 03/20/2017 11/21/2016 09/27/2016 08/25/2016 06/01/2016 08/26/2015  Decreased  Interest 0 0 0 0 0 0 0  Down, Depressed, Hopeless 0 0 0 0 0 0 0  PHQ - 2 Score 0 0 0 0 0 0 0  Altered sleeping - - - - - - -  Tired, decreased energy - - - - - - -  Change in appetite - - - - - - -  Feeling bad or failure about yourself  - - - - - - -  Trouble concentrating - - - - - - -  Moving slowly or fidgety/restless - - - - - - -  Suicidal thoughts - - - - - - -  PHQ-9 Score - - - - - - -    Review of Systems  Constitutional: Negative.   HENT: Negative.   Eyes: Negative.   Respiratory: Negative.   Cardiovascular: Negative.   Gastrointestinal: Negative.   Endocrine: Negative.   Genitourinary: Negative.   Musculoskeletal: Positive for back pain, gait problem and myalgias.  Skin: Negative.   Allergic/Immunologic: Negative.   Neurological: Positive for tremors, weakness and numbness.  Psychiatric/Behavioral: Negative.   All other systems reviewed and are negative.      Objective:   Physical Exam  Constitutional: He is oriented to person, place, and time. He appears well-developed and well-nourished.  HENT:  Head: Normocephalic and atraumatic.  Neck: Normal range of motion. Neck supple.  Cardiovascular: Normal rate and regular rhythm.  Pulmonary/Chest: Effort normal and breath sounds normal.  Musculoskeletal:  Normal Muscle Bulk and Muscle Testing Reveals: Upper Extremities: Full ROM and Muscle Strength 5/5 Right AC Joint Tenderness Thoracic Paraspinal Tenderness: T-1-T-3 Mainly Right Side Lumbar Paraspinal Tenderness: L-3-L-5 Lower Extremities: Full ROM and Muscle Strength 5/5 Arises from Table with ease Narrow Based gait  Neurological: He is alert and oriented to person, place, and time.  Skin: Skin is warm and dry.  Psychiatric: He has a normal mood and affect.  Nursing note and vitals reviewed.         Assessment & Plan:  1. Lumbar postlaminectomy syndrome status post lumbar fusion x3: His most recent surgery was 03/14/2013. He had hardware removal and  bone allograft waist and L2-3. Continue to Monitor. Continue current Medication Regimen.06/22/2017 Refilled: oxyCODONE 7.5/325mg  one tablet every 4 hours as needed #100 and Oxycontin 30 mg one tablet every 12 hours #60.  We will continue the opioid monitoring program, this consists of regular clinic visits, examinations, urine drug screen, pill counts as well as use of [place] Controlled Substance Reporting System. 2. Lumbar Radiculopathy: Continue current medication regimen with Lyrica. 06/22/2017 3.  Insomnia: Continue current medication regimen withTrazodone.06/22/2017 4. Nausea: Continue Zofran as needed. 06/22/2017 5. Cervical Spondylosis/ Cervicalgia/ Cervical Radiculitis: Continue current medication regimen with  Lyrica. 06/22/2017  20 minutes of face to face patient care time was spent during this visit. All questions were encouraged and answered.  F/U in 1 month

## 2017-06-26 LAB — DRUG TOX MONITOR 1 W/CONF, ORAL FLD
Amphetamines: NEGATIVE ng/mL (ref <10)
BUPRENORPHINE: NEGATIVE ng/mL (ref <0.10)
Barbiturates: NEGATIVE ng/mL (ref <10)
Benzodiazepines: NEGATIVE ng/mL (ref <0.50)
COCAINE: NEGATIVE ng/mL (ref <5.0)
CODEINE: NEGATIVE ng/mL (ref <2.5)
DIHYDROCODEINE: NEGATIVE ng/mL (ref <2.5)
Fentanyl: NEGATIVE ng/mL (ref <0.10)
HEROIN METABOLITE: NEGATIVE ng/mL (ref <1.0)
Hydrocodone: NEGATIVE ng/mL (ref <2.5)
Hydromorphone: NEGATIVE ng/mL (ref <2.5)
MARIJUANA: NEGATIVE ng/mL (ref <2.5)
MDMA: NEGATIVE ng/mL (ref <10)
MEPROBAMATE: NEGATIVE ng/mL (ref <2.5)
METHADONE: NEGATIVE ng/mL (ref <5.0)
MORPHINE: NEGATIVE ng/mL (ref <2.5)
NICOTINE METABOLITE: NEGATIVE ng/mL (ref <5.0)
NORHYDROCODONE: NEGATIVE ng/mL (ref <2.5)
NOROXYCODONE: 5.9 ng/mL — AB (ref <2.5)
OXYMORPHONE: NEGATIVE ng/mL (ref <2.5)
Opiates: POSITIVE ng/mL — AB (ref <2.5)
Oxycodone: 23.6 ng/mL — ABNORMAL HIGH (ref <2.5)
Phencyclidine: NEGATIVE ng/mL (ref <10)
TAPENTADOL: NEGATIVE ng/mL (ref <5.0)
Tramadol: NEGATIVE ng/mL (ref <5.0)
Zolpidem: NEGATIVE ng/mL (ref <5.0)

## 2017-06-26 LAB — DRUG TOX ALC METAB W/CON, ORAL FLD: ALCOHOL METABOLITE: NEGATIVE ng/mL (ref <25)

## 2017-06-27 ENCOUNTER — Other Ambulatory Visit: Payer: Self-pay | Admitting: *Deleted

## 2017-06-27 DIAGNOSIS — C090 Malignant neoplasm of tonsillar fossa: Secondary | ICD-10-CM

## 2017-06-27 DIAGNOSIS — Z8719 Personal history of other diseases of the digestive system: Secondary | ICD-10-CM

## 2017-06-27 DIAGNOSIS — R634 Abnormal weight loss: Secondary | ICD-10-CM

## 2017-06-27 MED ORDER — OXANDROLONE 10 MG PO TABS: 10.0000 mg | ORAL_TABLET | Freq: Every day | ORAL | 2 refills | Status: DC

## 2017-06-27 MED ORDER — METOCLOPRAMIDE HCL 10 MG PO TABS: ORAL_TABLET | ORAL | 2 refills | Status: DC

## 2017-06-29 ENCOUNTER — Telehealth: Payer: Self-pay | Admitting: *Deleted

## 2017-06-29 NOTE — Telephone Encounter (Signed)
Oral swab drug screen was consistent for prescribed medications.  ?

## 2017-07-20 ENCOUNTER — Encounter: Payer: Medicare Other | Attending: Physical Medicine and Rehabilitation

## 2017-07-20 ENCOUNTER — Ambulatory Visit (HOSPITAL_BASED_OUTPATIENT_CLINIC_OR_DEPARTMENT_OTHER): Payer: Medicare Other | Admitting: Physical Medicine & Rehabilitation

## 2017-07-20 ENCOUNTER — Encounter: Payer: Self-pay | Admitting: Physical Medicine & Rehabilitation

## 2017-07-20 VITALS — BP 115/61 | HR 62 | Ht 66.0 in | Wt 120.0 lb

## 2017-07-20 DIAGNOSIS — M961 Postlaminectomy syndrome, not elsewhere classified: Secondary | ICD-10-CM | POA: Insufficient documentation

## 2017-07-20 DIAGNOSIS — M542 Cervicalgia: Secondary | ICD-10-CM | POA: Diagnosis not present

## 2017-07-20 DIAGNOSIS — M5416 Radiculopathy, lumbar region: Secondary | ICD-10-CM | POA: Diagnosis not present

## 2017-07-20 DIAGNOSIS — Z9889 Other specified postprocedural states: Secondary | ICD-10-CM | POA: Diagnosis not present

## 2017-07-20 DIAGNOSIS — Z5181 Encounter for therapeutic drug level monitoring: Secondary | ICD-10-CM | POA: Diagnosis not present

## 2017-07-20 DIAGNOSIS — Z79899 Other long term (current) drug therapy: Secondary | ICD-10-CM | POA: Insufficient documentation

## 2017-07-20 DIAGNOSIS — G243 Spasmodic torticollis: Secondary | ICD-10-CM | POA: Diagnosis not present

## 2017-07-20 MED ORDER — OXYCODONE HCL ER 30 MG PO T12A: 30.0000 mg | EXTENDED_RELEASE_TABLET | Freq: Two times a day (BID) | ORAL | 0 refills | Status: DC

## 2017-07-20 MED ORDER — OXYCODONE-ACETAMINOPHEN 7.5-325 MG PO TABS: 1.0000 | ORAL_TABLET | ORAL | 0 refills | Status: DC | PRN

## 2017-07-20 NOTE — Progress Notes (Signed)
Subjective:    Patient ID: Bruce Olson, male    DOB: 1955/05/25, 62 y.o.   MRN: [id number]  HPI Chief complaint is neck pain 62 year old male who was treated for tonsillar carcinoma approximately 1 year ago who complains of chronic right-sided neck pain.  He had what sounds like a central line on that side.  In addition he received radiation therapy and chemotherapy. He feels like he has a knotted muscle on the right side of his neck.  This limits his looking towards the right side. His low back pain is chronic has not changed in character or location no lower extremity complaints. He describes fair relief from his pain medications and his dosages have not changed recently.  He denies any bowel problems as result of his medications no other side effects such as lethargy. He continues to smoke and we discussed recommendations for smoking cessation Pain Inventory Average Pain 7 Pain Right Now 7 My pain is sharp, stabbing and tingling  In the last 24 hours, has pain interfered with the following? General activity 7 Relation with others 7 Enjoyment of life 7 What TIME of day is your pain at its worst? all Sleep (in general) Poor  Pain is worse with: walking, sitting and standing Pain improves with: medication Relief from Meds: 3  Mobility walk without assistance use a cane ability to climb steps?  yes do you drive?  yes  Function disabled: date disabled .  Neuro/Psych weakness numbness tremor trouble walking  Prior Studies Any changes since last visit?  no  Physicians involved in your care Any changes since last visit?  no   Family History  Problem Relation Age of Onset  . Anesthesia problems Neg Hx   . Hypotension Neg Hx   . Malignant hyperthermia Neg Hx   . Pseudochol deficiency Neg Hx    Social History   Socioeconomic History  . Marital status: Single    Spouse name: Not on file  . Number of children: Not on file  . Years of education: Not on file   . Highest education level: Not on file  Occupational History  . Not on file  Social Needs  . Financial resource strain: Not on file  . Food insecurity:    Worry: Not on file    Inability: Not on file  . Transportation needs:    Medical: Not on file    Non-medical: Not on file  Tobacco Use  . Smoking status: Current Every Day Smoker    Packs/day: 1.00    Years: 33.00    Pack years: 33.00    Types: Cigarettes  . Smokeless tobacco: Never Used  Substance and Sexual Activity  . Alcohol use: No  . Drug use: No  . Sexual activity: Never    Comment: back problems  Lifestyle  . Physical activity:    Days per week: Not on file    Minutes per session: Not on file  . Stress: Not on file  Relationships  . Social connections:    Talks on phone: Not on file    Gets together: Not on file    Attends religious service: Not on file    Active member of club or organization: Not on file    Attends meetings of clubs or organizations: Not on file    Relationship status: Not on file  Other Topics Concern  . Not on file  Social History Narrative  . Not on file   Past Surgical History:  Procedure Laterality Date  . BACK SURGERY  2013   Back surgery  (fusion)09/15/2010 & 2013  . BACK SURGERY  03-2013  . HARDWARE REMOVAL N/A 03/14/2013   Procedure: HARDWARE REMOVAL;  Surgeon: Eustace Moore, MD;  Location: [place] [facility];  Service: Neurosurgery;  Laterality: N/A;   Past Medical History:  Diagnosis Date  . Goals of care, counseling/discussion 04/07/2016  . Iron deficiency anemia due to chronic blood loss 10/21/2016  . 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 No pertinent past medical history   . Occupational injury 2013   resulted in back injury that led to surgery  . Thyroid cancer ([place])    There were no vitals taken for this visit.  Opioid Risk Score:   Fall Risk Score:  `1  Depression screen PHQ 2/9  Depression screen [facility] 2/9 06/22/2017 04/20/2017 03/20/2017 11/21/2016 09/27/2016 08/25/2016  06/01/2016  Decreased Interest 0 0 0 0 0 0 0  Down, Depressed, Hopeless 0 0 0 0 0 0 0  PHQ - 2 Score 0 0 0 0 0 0 0  Altered sleeping - - - - - - -  Tired, decreased energy - - - - - - -  Change in appetite - - - - - - -  Feeling bad or failure about yourself  - - - - - - -  Trouble concentrating - - - - - - -  Moving slowly or fidgety/restless - - - - - - -  Suicidal thoughts - - - - - - -  PHQ-9 Score - - - - - - -   115/61   Review of Systems  Constitutional: Negative.   HENT: Negative.   Eyes: Negative.   Respiratory: Negative.   Cardiovascular: Negative.   Gastrointestinal: Negative.   Endocrine: Negative.   Genitourinary: Negative.   Musculoskeletal: Positive for arthralgias, back pain, gait problem and myalgias.  Skin: Negative.   Allergic/Immunologic: Negative.   Neurological: Positive for tremors, weakness and numbness.  Hematological: Negative.   Psychiatric/Behavioral: Negative.   All other systems reviewed and are negative.      Objective:   Physical Exam  Constitutional: He is oriented to person, place, and time. He appears well-developed and well-nourished.  HENT:  Head: Normocephalic and atraumatic.  Neurological: He is alert and oriented to person, place, and time. He has normal strength.  Skin: Skin is warm and dry.  Psychiatric: He has a normal mood and affect.  Nursing note and vitals reviewed.   Prominent Levator scapulae on Right with tenderness to palpation Lateral bending 50% R and 100% left Cervical Flex 100% Cervical ext <25% Cervical Rotation Left 100%, Right 25%      Assessment & Plan:   1.  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 UDS 06/22/2017- appropriate PMP Aware - reviewed no red flags Continue OxyContin 30 mg twice a day Continue oxycodone 7.5 mg 3 times daily, number 100 tablets/month may take an extra tablet 10 days/month Review opioid consent and controlled substance agreement in 1  month Nurse practitioner visit 1 month  2.  Cervicalgia- has tight Right levator scapula, this limits range of motion.  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  Recommend botulinum toxin injection 50 units

## 2017-07-20 NOTE — Patient Instructions (Signed)
Sit-to-Stand Exercise The sit-to-stand exercise (also known as the chair stand or chair rise exercise) strengthens your lower body and helps you maintain or improve your mobility and independence. The goal is to do the sit-to-stand exercise without using your hands. This will be easier as you become stronger. You should always talk with your health care provider before starting any exercise program, especially if you have had recent surgery. Do the exercise exactly as told by your health care provider and adjust it as directed. It is normal to feel mild stretching, pulling, tightness, or discomfort as you do this exercise, but you should stop right away if you feel sudden pain or your pain gets worse. Do not begin doing this exercise until told by your health care provider. What the sit-to-stand exercise does The sit-to-stand exercise helps to strengthen the muscles in your thighs and the muscles in the center of your body that give you stability (core muscles). This exercise is especially helpful if:  You have had knee or hip surgery.  You have trouble getting up from a chair, out of a car, or off the toilet.  How to do the sit-to-stand exercise 1. Sit toward the front edge of a sturdy chair without armrests. Your knees should be bent and your feet should be flat on the floor and shoulder-width apart. 2. Place your hands lightly on each side of the seat. Keep your back and neck as straight as possible, with your chest slightly forward. 3. Breathe in slowly. Lean forward and slightly shift your weight to the front of your feet. 4. Breathe out as you slowly stand up. Use your hands as little as possible. 5. Stand and pause for a full breath in and out. 6. Breathe in as you sit down slowly. Tighten your core and abdominal muscles to control your lowering as much as possible. 7. Breathe out slowly. 8. Do this exercise 10-15 times. If needed, do it fewer times until you build up strength. 9. Rest for  1 minute, then do another set of 10-15 repetitions. To change the difficulty of the sit-to-stand exercise  If the exercise is too difficult, use a chair with sturdy armrests, and push off the armrests to help you come to the standing position. You can also use the armrests to help slowly lower yourself back to sitting. As this gets easier, try to use your arms less. You can also place a firm cushion or pillow on the chair to make the surface higher.  If this exercise is too easy, do not use your arms to help raise or lower yourself. You can also wear a weighted vest, use hand weights, increase your repetitions, or try a lower chair. General tips  You may feel tired when starting an exercise routine. This is normal.  You may have muscle soreness that lasts a few days. This is normal. As you get stronger, you may not feel muscle soreness.  Use smooth, steady movements.  Do not  hold your breath during strength exercises. This can cause unsafe changes in your blood pressure.  Breathe in slowly through your nose, and breathe out slowly through your mouth. Summary  Strengthening your lower body is an important step to help you move safely and independently.  The sit-to-stand exercise helps strengthen the muscles in your thighs and core.  You should always talk with your health care provider before starting any exercise program, especially if you have had recent surgery. This information is not intended to replace   advice given to you by your health care provider. Make sure you discuss any questions you have with your health care provider. Document Released: 03/10/2016 Document Revised: 03/10/2016 Document Reviewed: 03/10/2016 Elsevier Interactive Patient Education  2018 [employer] Inc[employer]  

## 2017-08-10 ENCOUNTER — Inpatient Hospital Stay: Payer: Medicare Other

## 2017-08-10 ENCOUNTER — Other Ambulatory Visit: Payer: Self-pay

## 2017-08-10 ENCOUNTER — Inpatient Hospital Stay: Payer: Medicare Other | Attending: Hematology & Oncology | Admitting: Hematology & Oncology

## 2017-08-10 ENCOUNTER — Encounter: Payer: Self-pay | Admitting: Hematology & Oncology

## 2017-08-10 DIAGNOSIS — Z85818 Personal history of malignant neoplasm of other sites of lip, oral cavity, and pharynx: Secondary | ICD-10-CM

## 2017-08-10 DIAGNOSIS — R112 Nausea with vomiting, unspecified: Secondary | ICD-10-CM

## 2017-08-10 DIAGNOSIS — D500 Iron deficiency anemia secondary to blood loss (chronic): Secondary | ICD-10-CM

## 2017-08-10 DIAGNOSIS — Z79899 Other long term (current) drug therapy: Secondary | ICD-10-CM | POA: Diagnosis not present

## 2017-08-10 DIAGNOSIS — Z9221 Personal history of antineoplastic chemotherapy: Secondary | ICD-10-CM | POA: Diagnosis not present

## 2017-08-10 DIAGNOSIS — Z923 Personal history of irradiation: Secondary | ICD-10-CM | POA: Diagnosis not present

## 2017-08-10 DIAGNOSIS — E034 Atrophy of thyroid (acquired): Secondary | ICD-10-CM

## 2017-08-10 DIAGNOSIS — C090 Malignant neoplasm of tonsillar fossa: Secondary | ICD-10-CM

## 2017-08-10 LAB — CMP (CANCER CENTER ONLY)
ALT: 57 U/L — AB (ref 0–44)
AST: 36 U/L (ref 15–41)
Albumin: 4 g/dL (ref 3.5–5.0)
Alkaline Phosphatase: 46 U/L (ref 38–126)
Anion gap: 6 (ref 5–15)
BILIRUBIN TOTAL: 0.3 mg/dL (ref 0.3–1.2)
BUN: 15 mg/dL (ref 8–23)
CO2: 26 mmol/L (ref 22–32)
CREATININE: 1.27 mg/dL — AB (ref 0.61–1.24)
Calcium: 8.9 mg/dL (ref 8.9–10.3)
Chloride: 98 mmol/L (ref 98–111)
GFR, EST NON AFRICAN AMERICAN: 59 mL/min — AB (ref >60)
Glucose, Bld: 78 mg/dL (ref 70–99)
Potassium: 4.5 mmol/L (ref 3.5–5.1)
Sodium: 130 mmol/L — ABNORMAL LOW (ref 135–145)
TOTAL PROTEIN: 6.6 g/dL (ref 6.5–8.1)

## 2017-08-10 LAB — CBC WITH DIFFERENTIAL (CANCER CENTER ONLY)
Basophils Absolute: 0 10*3/uL (ref 0.0–0.1)
Basophils Relative: 0 %
EOS PCT: 3 %
Eosinophils Absolute: 0.1 10*3/uL (ref 0.0–0.5)
HEMATOCRIT: 33.1 % — AB (ref 38.7–49.9)
Hemoglobin: 11.4 g/dL — ABNORMAL LOW (ref 13.0–17.1)
LYMPHS ABS: 1 10*3/uL (ref 0.9–3.3)
LYMPHS PCT: 22 %
MCH: 29.9 pg (ref 28.0–33.4)
MCHC: 34.4 g/dL (ref 32.0–35.9)
MCV: 86.9 fL (ref 82.0–98.0)
MONO ABS: 0.7 10*3/uL (ref 0.1–0.9)
Monocytes Relative: 15 %
NEUTROS ABS: 2.9 10*3/uL (ref 1.5–6.5)
Neutrophils Relative %: 60 %
Platelet Count: 213 10*3/uL (ref 145–400)
RBC: 3.81 MIL/uL — AB (ref 4.20–5.70)
RDW: 13.5 % (ref 11.1–15.7)
WBC Count: 4.7 10*3/uL (ref 4.0–10.0)

## 2017-08-10 NOTE — Progress Notes (Signed)
Hematology and Oncology Follow Up Visit  Bruce Olson [id number] 1955-04-06 62 y.o. 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  His problem right now is that he is having some vomiting.  This seems to happen between 30 minutes and 60 minutes after he eats.  I am not sure exactly what is going on.  I did give him some Reglan the last time that he was here.  It does not sound like this is helping that much.  It sounds like he needs to have a barium swallow and gastric emptying scan.  He says that he has had esophageal dilations in the past.  There has been no problems with pain.  He is on oxycodone for pain.  He had a PET scan done recently.  The PET scan did not show any evidence of recurrent head neck cancer.  There is been no bleeding.  He has had no fever.  He has had no obvious change in bowel or bladder habits.  ECOG Performance Status: 1 - Symptomatic but completely ambulatory  Medications:  Allergies as of 08/10/2017   No Known Allergies     Medication List        Accurate as of 08/10/17  5:27 PM. Always use your most recent med list.          docusate sodium 100 MG capsule Commonly known as:  COLACE Take 100 mg by mouth daily.   meclizine 25 MG tablet Commonly known as:  ANTIVERT Take 1 tablet (25 mg total) by mouth 3 (three) times daily as needed.   methocarbamol 500 MG tablet Commonly known as:  ROBAXIN Take 1 tablet (500 mg total) by mouth 2 (two) times daily as needed for muscle spasms.   metoCLOPramide 10 MG tablet Commonly known as:  REGLAN TAKE 1 TABLET BY MOUTH 4 TIMES DAILY BEFORE MEALS AND AT BEDTIME   mirtazapine 7.5 MG tablet Commonly known as:  REMERON Take 1 tablet (7.5 mg total) by mouth at bedtime.   multivitamin with minerals Tabs tablet Take 1 tablet by mouth daily.   oxandrolone 10 MG tablet Commonly known as:  OXANDRIN Take 1 tablet (10 mg total) by mouth daily.   oxyCODONE 30 MG 12 hr tablet Commonly known as:  OXYCONTIN Take 1 tablet (30 mg total) by mouth 2 (two) times daily.   oxyCODONE-acetaminophen 7.5-325 MG tablet Commonly known as:  PERCOCET Take 1 tablet by mouth every 4 (four) hours as needed. May take one extra tablet when pain is sever. No more than 4 a day   pantoprazole 40 MG tablet Commonly known as:  PROTONIX Take 1 tablet (40 mg total) by mouth 2 (two) times daily before a meal.   pilocarpine 5 MG tablet Commonly known as:  SALAGEN Take 1 tablet (5 mg total) by mouth 2 (two) times daily.   pregabalin 100 MG capsule Commonly known as:  LYRICA Take 1 capsule (100 mg total) by mouth 3 (three) times daily.   scopolamine 1 MG/3DAYS Commonly known as:  TRANSDERM-SCOP Place 1 patch (1.5 mg total) onto the skin every 3 (three) days.   sucralfate 1 GM/10ML suspension Commonly known as:  CARAFATE Take 10 mLs (1 g total) by mouth 4 (four) times daily -  with meals  and at bedtime.       Allergies: No Known Allergies  Past Medical History, Surgical history, Social history, and Family History were reviewed and updated.  Review of Systems: Review of Systems  All other systems reviewed and are negative.   Physical Exam:  weight is 120 lb (54.4 kg). His oral temperature is 98 F (36.7 C). His blood pressure is 117/59 (abnormal) and his pulse is 58 (abnormal). His respiration is 18 and oxygen saturation is 97%.   Wt Readings from Last 3 Encounters:  08/10/17 120 lb (54.4 kg)  07/20/17 120 lb (54.4 kg)  06/22/17 122 lb (55.3 kg)    Physical Exam  Constitutional: He is oriented to person, place, and time.  HENT:  Head: Normocephalic and atraumatic.  Mouth/Throat: Oropharynx is clear and moist.  Eyes: Pupils are equal, round, and reactive to light.  EOM are normal.  Neck: Normal range of motion.  Cardiovascular: Normal rate, regular rhythm and normal heart sounds.  Pulmonary/Chest: Effort normal and breath sounds normal.  Abdominal: Soft. Bowel sounds are normal.  Musculoskeletal: Normal range of motion. He exhibits no edema, tenderness or deformity.  Lymphadenopathy:    He has no cervical adenopathy.  Neurological: He is alert and oriented to person, place, and time.  Skin: Skin is warm and dry. No rash noted. No erythema.  Psychiatric: He has a normal mood and affect. His behavior is normal. Judgment and thought content normal.  Vitals reviewed.    Lab Results  Component Value Date   WBC 4.7 08/10/2017   HGB 11.4 (L) 08/10/2017   HCT 33.1 (L) 08/10/2017   MCV 86.9 08/10/2017   PLT 213 08/10/2017   Lab Results  Component Value Date   FERRITIN 360 (H) 01/20/2017   IRON 60 01/20/2017   TIBC 319 01/20/2017   UIBC 258 01/20/2017   IRONPCTSAT 19 (L) 01/20/2017   Lab Results  Component Value Date   RBC 3.81 (L) 08/10/2017   No results found for: KPAFRELGTCHN, LAMBDASER, KAPLAMBRATIO No results found for: IGGSERUM, IGA, IGMSERUM No results found for: Kathrynn Ducking, MSPIKE, SPEI   Chemistry      Component Value Date/Time   NA 130 (L) 08/10/2017 1309   NA 138 01/20/2017 1324   NA 135 (L) 04/05/2016 1333   K 4.5 08/10/2017 1309   K 4.5 01/20/2017 1324   K 4.7 04/05/2016 1333   CL 98 08/10/2017 1309   CL 97 (L) 01/20/2017 1324   CO2 26 08/10/2017 1309   CO2 30 01/20/2017 1324   CO2 29 04/05/2016 1333   BUN 15 08/10/2017 1309   BUN 15 01/20/2017 1324   BUN 23.6 04/05/2016 1333   CREATININE 1.27 (H) 08/10/2017 1309   CREATININE 1.2 01/20/2017 1324   CREATININE 1.1 04/05/2016 1333      Component Value Date/Time   CALCIUM 8.9 08/10/2017 1309   CALCIUM 9.2 01/20/2017 1324   CALCIUM 9.2 04/05/2016 1333   ALKPHOS 46 08/10/2017 1309   ALKPHOS 51 01/20/2017 1324   ALKPHOS  67 04/05/2016 1333   AST 36 08/10/2017 1309   AST 17 04/05/2016 1333   ALT 57 (H) 08/10/2017 1309   ALT 26 01/20/2017 1324   ALT 10 04/05/2016 1333   BILITOT 0.3 08/10/2017 1309   BILITOT 0.30 04/05/2016 1333      Impression and Plan: Bruce Olson is a very pleasant 61 yo caucasian gentleman with history of squamous cell carcinoma of the left tonsil, HPV positive.  I hate that he has this vomiting.  We will need to find out what is going on.  I think that a barium swallow and gastric emptying scan would help [place].  I will try to get this set up for next week.  I would like to see him back in a month.  I want to make sure that everything is doing okay.  If there is abnormalities with the barium swallow and gastric emptying scan, then we probably are going to need to refer him back to gastroneurology.  He is only seeing gastroenterology at [facility].  He does not want to go back there.  [name], MD 7/11/20195:27 PM

## 2017-08-11 LAB — IRON AND TIBC
IRON: 47 ug/dL (ref 42–163)
Saturation Ratios: 14 % — ABNORMAL LOW (ref 42–163)
TIBC: 328 ug/dL (ref 202–409)
UIBC: 280 ug/dL

## 2017-08-11 LAB — TSH: TSH: 1.951 u[IU]/mL (ref 0.320–4.118)

## 2017-08-11 LAB — FERRITIN: Ferritin: 322 ng/mL (ref 24–336)

## 2017-08-22 ENCOUNTER — Encounter: Payer: Self-pay | Admitting: Physical Medicine & Rehabilitation

## 2017-08-22 ENCOUNTER — Encounter: Payer: Medicare Other | Attending: Physical Medicine and Rehabilitation

## 2017-08-22 ENCOUNTER — Ambulatory Visit (HOSPITAL_BASED_OUTPATIENT_CLINIC_OR_DEPARTMENT_OTHER): Payer: Medicare Other | Admitting: Physical Medicine & Rehabilitation

## 2017-08-22 VITALS — BP 156/84 | HR 60 | Ht 66.0 in | Wt 120.0 lb

## 2017-08-22 DIAGNOSIS — Z9889 Other specified postprocedural states: Secondary | ICD-10-CM | POA: Insufficient documentation

## 2017-08-22 DIAGNOSIS — Z79899 Other long term (current) drug therapy: Secondary | ICD-10-CM | POA: Diagnosis not present

## 2017-08-22 DIAGNOSIS — M961 Postlaminectomy syndrome, not elsewhere classified: Secondary | ICD-10-CM | POA: Insufficient documentation

## 2017-08-22 DIAGNOSIS — M542 Cervicalgia: Secondary | ICD-10-CM | POA: Diagnosis not present

## 2017-08-22 DIAGNOSIS — G243 Spasmodic torticollis: Secondary | ICD-10-CM

## 2017-08-22 DIAGNOSIS — Z5181 Encounter for therapeutic drug level monitoring: Secondary | ICD-10-CM | POA: Insufficient documentation

## 2017-08-22 DIAGNOSIS — M5416 Radiculopathy, lumbar region: Secondary | ICD-10-CM | POA: Diagnosis not present

## 2017-08-22 MED ORDER — PREGABALIN 100 MG PO CAPS: 100.0000 mg | ORAL_CAPSULE | Freq: Three times a day (TID) | ORAL | 5 refills | Status: DC

## 2017-08-22 MED ORDER — OXYCODONE HCL ER 30 MG PO T12A: 30.0000 mg | EXTENDED_RELEASE_TABLET | Freq: Two times a day (BID) | ORAL | 0 refills | Status: DC

## 2017-08-22 MED ORDER — OXYCODONE-ACETAMINOPHEN 7.5-325 MG PO TABS: 1.0000 | ORAL_TABLET | ORAL | 0 refills | Status: DC | PRN

## 2017-08-22 NOTE — Patient Instructions (Signed)

## 2017-08-22 NOTE — Progress Notes (Signed)
  Botulinum toxin injection for cervical dystonia CPT code [phone] Diagnosis code G 24.3 Indication is cervical dystonia that has not responded to conservative care and interferes with activities of daily living as well as cervical range of motion. Chronic cervical pain not relieved by other treatments.  Informed consent was obtained after describing risks and benefits of the procedure with the patient this included bleeding bruising and infection The patient elects to proceed and has given Written consent.  REMS form completed  Patient placed in a seated position A 27-gauge 1 inch needle electrode was used to guide the injection under EMG guidance.  Muscles and dosing:   All injections done after negative drawback for blood. Patient tolerated procedure well. Post procedure instructions given. Follow up appointment made

## 2017-08-30 ENCOUNTER — Ambulatory Visit (HOSPITAL_COMMUNITY)
Admission: RE | Admit: 2017-08-30 | Discharge: 2017-08-30 | Disposition: A | Discharge status: Home / Self Care | Payer: Medicare Other | Source: Ambulatory Visit | Attending: Hematology & Oncology | Admitting: Hematology & Oncology

## 2017-08-30 DIAGNOSIS — R112 Nausea with vomiting, unspecified: Secondary | ICD-10-CM | POA: Diagnosis not present

## 2017-08-30 DIAGNOSIS — C091 Malignant neoplasm of tonsillar pillar (anterior) (posterior): Secondary | ICD-10-CM | POA: Diagnosis not present

## 2017-08-30 DIAGNOSIS — Z510 Encounter for antineoplastic radiation therapy: Secondary | ICD-10-CM | POA: Diagnosis not present

## 2017-08-31 ENCOUNTER — Other Ambulatory Visit: Payer: Self-pay | Admitting: Registered Nurse

## 2017-08-31 ENCOUNTER — Ambulatory Visit (HOSPITAL_COMMUNITY)
Admission: RE | Admit: 2017-08-31 | Discharge: 2017-08-31 | Disposition: A | Discharge status: Home / Self Care | Payer: Medicare Other | Source: Ambulatory Visit | Attending: Hematology & Oncology | Admitting: Hematology & Oncology

## 2017-08-31 DIAGNOSIS — R112 Nausea with vomiting, unspecified: Secondary | ICD-10-CM | POA: Diagnosis not present

## 2017-08-31 MED ORDER — TECHNETIUM TC 99M SULFUR COLLOID
2.1000 | Freq: Once | INTRAVENOUS | Status: AC
  Administered 2017-08-31: 2.1 via ORAL

## 2017-09-01 ENCOUNTER — Telehealth: Payer: Self-pay | Admitting: *Deleted

## 2017-09-01 NOTE — Telephone Encounter (Addendum)
Patient is aware of results  ----- Message from [name], MD sent at 08/31/2017  5:03 PM EDT ----- Call - Normal swallowing study and normal gastric emptying.  Bruce Olson

## 2017-09-21 ENCOUNTER — Encounter: Payer: Medicare Other | Attending: Physical Medicine and Rehabilitation | Admitting: Registered Nurse

## 2017-09-21 ENCOUNTER — Encounter: Payer: Self-pay | Admitting: Registered Nurse

## 2017-09-21 VITALS — BP 152/77 | HR 70 | Ht 66.0 in | Wt 119.6 lb

## 2017-09-21 DIAGNOSIS — M5412 Radiculopathy, cervical region: Secondary | ICD-10-CM

## 2017-09-21 DIAGNOSIS — Z5181 Encounter for therapeutic drug level monitoring: Secondary | ICD-10-CM | POA: Diagnosis not present

## 2017-09-21 DIAGNOSIS — M7918 Myalgia, other site: Secondary | ICD-10-CM

## 2017-09-21 DIAGNOSIS — M542 Cervicalgia: Secondary | ICD-10-CM | POA: Diagnosis not present

## 2017-09-21 DIAGNOSIS — M5416 Radiculopathy, lumbar region: Secondary | ICD-10-CM | POA: Insufficient documentation

## 2017-09-21 DIAGNOSIS — Z79899 Other long term (current) drug therapy: Secondary | ICD-10-CM | POA: Diagnosis not present

## 2017-09-21 DIAGNOSIS — Z9889 Other specified postprocedural states: Secondary | ICD-10-CM

## 2017-09-21 DIAGNOSIS — G894 Chronic pain syndrome: Secondary | ICD-10-CM | POA: Diagnosis not present

## 2017-09-21 DIAGNOSIS — M961 Postlaminectomy syndrome, not elsewhere classified: Secondary | ICD-10-CM | POA: Diagnosis not present

## 2017-09-21 MED ORDER — OXYCODONE HCL ER 30 MG PO T12A: 30.0000 mg | EXTENDED_RELEASE_TABLET | Freq: Two times a day (BID) | ORAL | 0 refills | Status: DC

## 2017-09-21 MED ORDER — OXYCODONE-ACETAMINOPHEN 7.5-325 MG PO TABS: 1.0000 | ORAL_TABLET | ORAL | 0 refills | Status: DC | PRN

## 2017-09-21 NOTE — Progress Notes (Signed)
Subjective:    Patient ID: Bruce Olson, male    DOB: December 13, 1955, 62 y.o.   MRN: [id number]  HPI: [name] is a 62 year old male who returns for follow up appointment for chronic pain and medication refill. He states his pain is located in his neck radiating into his right shoulder and lower back pain. He rates his pain 8. His current exercise regime is walking.   S/P Botox with no relief noted.   Pain Inventory Average Pain 7 Pain Right Now 8 My pain is sharp, stabbing and tingling  In the last 24 hours, has pain interfered with the following? General activity 7 Relation with others 7 Enjoyment of life 7 What TIME of day is your pain at its worst? all Sleep (in general) Poor  Pain is worse with: walking, sitting and standing Pain improves with: heat/ice, medication and TENS Relief from Meds: 3  Mobility walk without assistance use a cane use a walker ability to climb steps?  yes do you drive?  yes  Function disabled: date disabled 2012  Neuro/Psych weakness numbness tremor trouble walking  Prior Studies Any changes since last visit?  no  Physicians involved in your care Any changes since last visit?  no   Family History  Problem Relation Age of Onset  . Anesthesia problems Neg Hx   . Hypotension Neg Hx   . Malignant hyperthermia Neg Hx   . Pseudochol deficiency Neg Hx    Social History   Socioeconomic History  . Marital status: Single    Spouse name: Not on file  . Number of children: Not on file  . Years of education: Not on file  . Highest education level: Not on file  Occupational History  . Not on file  Social Needs  . Financial resource strain: Not on file  . Food insecurity:    Worry: Not on file    Inability: Not on file  . Transportation needs:    Medical: Not on file    Non-medical: Not on file  Tobacco Use  . Smoking status: Current Every Day Smoker    Packs/day: 1.00    Years: 33.00    Pack years: 33.00   Types: Cigarettes  . Smokeless tobacco: Never Used  Substance and Sexual Activity  . Alcohol use: No  . Drug use: No  . Sexual activity: Never    Comment: back problems  Lifestyle  . Physical activity:    Days per week: Not on file    Minutes per session: Not on file  . Stress: Not on file  Relationships  . Social connections:    Talks on phone: Not on file    Gets together: Not on file    Attends religious service: Not on file    Active member of club or organization: Not on file    Attends meetings of clubs or organizations: Not on file    Relationship status: Not on file  Other Topics Concern  . Not on file  Social History Narrative  . Not on file   Past Surgical History:  Procedure Laterality Date  . BACK SURGERY  2013   Back surgery  (fusion)09/15/2010 & 2013  . BACK SURGERY  03-2013  . HARDWARE REMOVAL N/A 03/14/2013   Procedure: HARDWARE REMOVAL;  Surgeon: Eustace Moore, MD;  Location: [place] [facility];  Service: Neurosurgery;  Laterality: N/A;   Past Medical History:  Diagnosis Date  . Goals of care, counseling/discussion  04/07/2016  . Iron deficiency anemia due to chronic blood loss 10/21/2016  . 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 No pertinent past medical history   . Occupational injury 2013   resulted in back injury that led to surgery  . Thyroid cancer ([place])    There were no vitals taken for this visit.  Opioid Risk Score:   Fall Risk Score:  `1  Depression screen PHQ 2/9  Depression screen [facility] 2/9 06/22/2017 04/20/2017 03/20/2017 11/21/2016 09/27/2016 08/25/2016 06/01/2016  Decreased Interest 0 0 0 0 0 0 0  Down, Depressed, Hopeless 0 0 0 0 0 0 0  PHQ - 2 Score 0 0 0 0 0 0 0  Altered sleeping - - - - - - -  Tired, decreased energy - - - - - - -  Change in appetite - - - - - - -  Feeling bad or failure about yourself  - - - - - - -  Trouble concentrating - - - - - - -  Moving slowly or fidgety/restless - - - - - - -  Suicidal thoughts - - - - - - -  PHQ-9 Score -  - - - - - -     Review of Systems  Constitutional: Negative.   HENT: Negative.   Eyes: Negative.   Respiratory: Negative.   Cardiovascular: Negative.   Gastrointestinal: Negative.   Endocrine: Negative.   Genitourinary: Negative.   Musculoskeletal: Positive for arthralgias.  Skin: Negative.   Allergic/Immunologic: Negative.   Neurological: Positive for tremors, weakness and numbness.  Hematological: Negative.   Psychiatric/Behavioral: Negative.        Objective:   Physical Exam  Constitutional: He is oriented to person, place, and time. He appears well-developed and well-nourished.  HENT:  Head: Normocephalic and atraumatic.  Neck: Normal range of motion. Neck supple.  Cervical Paraspinal Tenderness: C-5-C-6  Cardiovascular: Normal rate and regular rhythm.  Pulmonary/Chest: Effort normal.  Coarse Rhonchi  Musculoskeletal:  Normal Muscle Bulk and Muscle Testing Reveals: Upper Extremities: Full ROM and Muscle Strength 5/5 Right AC Joint Tenderness Lumbar Paraspinal Tenderness: L-3-L-5 Lower Extremities: Full ROM and Muscle Strength 5/5 Arises from Table with ease Narrow Based Gait  Neurological: He is alert and oriented to person, place, and time.  Skin: Skin is warm and dry.  Psychiatric: He has a normal mood and affect. His behavior is normal.  Nursing note and vitals reviewed.         Assessment & Plan:  1. Lumbar postlaminectomy syndrome status post lumbar fusion x3: His most recent surgery was 03/14/2013. He had hardware removal and bone allograft waist and L2-3. Continue to Monitor. Continue current Medication Regimen.09/22/2017 Refilled: oxyCODONE 7.5/325mg  one tablet every 4 hours as needed #100 and Oxycontin 30 mg one tablet every 12 hours #60.  We will continue the opioid monitoring program, this consists of regular clinic visits, examinations, urine drug screen, pill counts as well as use of [place] Controlled Substance Reporting System. 2.  Lumbar Radiculopathy: Continuecurrent medication regimen withLyrica. 09/22/2017 3. Insomnia: Continuecurrent medication regimen withTrazodone.09/22/2017 4. Nausea: Continue Zofran as needed. 09/22/2017 5. Cervical Spondylosis/ Cervicalgia/ Cervical Radiculitis: Continuecurrent medication regimen withLyrica. 09/22/2017  20 minutes of face to face patient care time was spent during this visit. All questions were encouraged and answered.  F/U in 1 month

## 2017-09-26 ENCOUNTER — Telehealth: Payer: Self-pay | Admitting: *Deleted

## 2017-09-26 LAB — DRUG TOX MONITOR 1 W/CONF, ORAL FLD
Amphetamines: NEGATIVE ng/mL (ref <10)
BENZODIAZEPINES: NEGATIVE ng/mL (ref <0.50)
BUPRENORPHINE: NEGATIVE ng/mL (ref <0.10)
Barbiturates: NEGATIVE ng/mL (ref <10)
Cocaine: NEGATIVE ng/mL (ref <5.0)
Codeine: NEGATIVE ng/mL (ref <2.5)
Cotinine: 15.6 ng/mL — ABNORMAL HIGH (ref <5.0)
Dihydrocodeine: NEGATIVE ng/mL (ref <2.5)
FENTANYL: NEGATIVE ng/mL (ref <0.10)
HYDROMORPHONE: NEGATIVE ng/mL (ref <2.5)
Heroin Metabolite: NEGATIVE ng/mL (ref <1.0)
Hydrocodone: NEGATIVE ng/mL (ref <2.5)
MARIJUANA: NEGATIVE ng/mL (ref <2.5)
MDMA: NEGATIVE ng/mL (ref <10)
MEPROBAMATE: NEGATIVE ng/mL (ref <2.5)
Methadone: NEGATIVE ng/mL (ref <5.0)
Morphine: NEGATIVE ng/mL (ref <2.5)
NORHYDROCODONE: NEGATIVE ng/mL (ref <2.5)
NOROXYCODONE: 11.5 ng/mL — AB (ref <2.5)
Nicotine Metabolite: POSITIVE ng/mL — AB (ref <5.0)
OPIATES: POSITIVE ng/mL — AB (ref <2.5)
OXYCODONE: 21 ng/mL — AB (ref <2.5)
Oxymorphone: NEGATIVE ng/mL (ref <2.5)
PHENCYCLIDINE: NEGATIVE ng/mL (ref <10)
Tapentadol: NEGATIVE ng/mL (ref <5.0)
Tramadol: NEGATIVE ng/mL (ref <5.0)
Zolpidem: NEGATIVE ng/mL (ref <5.0)

## 2017-09-26 LAB — DRUG TOX ALC METAB W/CON, ORAL FLD: Alcohol Metabolite: NEGATIVE ng/mL (ref <25)

## 2017-09-26 NOTE — Telephone Encounter (Signed)
Oral swab drug screen was consistent for prescribed medications.  ?

## 2017-09-27 ENCOUNTER — Other Ambulatory Visit: Payer: Self-pay | Admitting: *Deleted

## 2017-09-27 DIAGNOSIS — R634 Abnormal weight loss: Secondary | ICD-10-CM

## 2017-09-27 DIAGNOSIS — C090 Malignant neoplasm of tonsillar fossa: Secondary | ICD-10-CM

## 2017-09-27 DIAGNOSIS — Z8719 Personal history of other diseases of the digestive system: Secondary | ICD-10-CM

## 2017-09-27 MED ORDER — METOCLOPRAMIDE HCL 10 MG PO TABS: ORAL_TABLET | ORAL | 2 refills | Status: DC

## 2017-09-27 MED ORDER — OXANDROLONE 10 MG PO TABS: 10.0000 mg | ORAL_TABLET | Freq: Every day | ORAL | 2 refills | Status: DC

## 2017-09-28 ENCOUNTER — Other Ambulatory Visit: Payer: Self-pay

## 2017-09-28 ENCOUNTER — Encounter: Payer: Self-pay | Admitting: Hematology & Oncology

## 2017-09-28 ENCOUNTER — Inpatient Hospital Stay: Payer: Medicare Other | Attending: Hematology & Oncology | Admitting: Hematology & Oncology

## 2017-09-28 VITALS — BP 148/65 | HR 70 | Temp 98.3°F | Resp 18 | Wt 120.0 lb

## 2017-09-28 DIAGNOSIS — Z923 Personal history of irradiation: Secondary | ICD-10-CM | POA: Diagnosis not present

## 2017-09-28 DIAGNOSIS — E034 Atrophy of thyroid (acquired): Secondary | ICD-10-CM | POA: Diagnosis not present

## 2017-09-28 DIAGNOSIS — C099 Malignant neoplasm of tonsil, unspecified: Secondary | ICD-10-CM | POA: Diagnosis not present

## 2017-09-28 DIAGNOSIS — K117 Disturbances of salivary secretion: Secondary | ICD-10-CM

## 2017-09-28 DIAGNOSIS — Z9221 Personal history of antineoplastic chemotherapy: Secondary | ICD-10-CM

## 2017-09-28 DIAGNOSIS — C090 Malignant neoplasm of tonsillar fossa: Secondary | ICD-10-CM | POA: Diagnosis not present

## 2017-09-28 DIAGNOSIS — R52 Pain, unspecified: Secondary | ICD-10-CM

## 2017-09-28 MED ORDER — PILOCARPINE HCL 5 MG PO TABS: 5.0000 mg | ORAL_TABLET | Freq: Two times a day (BID) | ORAL | 6 refills | Status: DC

## 2017-09-28 NOTE — Progress Notes (Signed)
Hematology and Oncology Follow Up Visit  Bruce Olson [id number] May 23, 1955 62 y.o. 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  We did the barium swallow on him.  This is for esophageal testing.  He had a normal barium swallow.  He has some esophageal thickening but there is no esophageal motion abnormality.  He does not like the scopolamine patch.  He had been on Salagen.  I am not sure why he is not taking this.  I went ahead and reorder this.  Maybe this will help him with his eating.  He had a Botox injection into the neck.  He said this really did not help him much.  He does go see a pain doctor.  His pain doctor helps him with the OxyContin and Percocet.  He has had no bleeding.  He has had no change in bowel or bladder habits.   ECOG Performance Status: 1 - Symptomatic but completely ambulatory  Medications:  Allergies as of 09/28/2017   No Known Allergies     Medication List        Accurate as of 09/28/17 12:25 PM. Always use your most recent med list.          docusate sodium 100 MG capsule Commonly known as:  COLACE Take 100 mg by mouth daily.   LYRICA 100 MG capsule Generic drug:  pregabalin TAKE 1 CAPSULE BY MOUTH THREE TIMES A DAY   meclizine 25 MG tablet Commonly known as:  ANTIVERT Take 1 tablet (25 mg total) by mouth 3 (three) times daily as needed.   methocarbamol 500 MG tablet Commonly known as:  ROBAXIN Take 1 tablet (500 mg total) by mouth 2 (two) times daily as needed for muscle spasms.   metoCLOPramide 10 MG tablet Commonly known as:  REGLAN TAKE 1 TABLET BY MOUTH 4 TIMES DAILY BEFORE MEALS AND AT BEDTIME   mirtazapine 7.5 MG tablet Commonly known as:  REMERON Take 1 tablet (7.5 mg total) by mouth  at bedtime.   multivitamin with minerals Tabs tablet Take 1 tablet by mouth daily.   oxandrolone 10 MG tablet Commonly known as:  OXANDRIN Take 1 tablet (10 mg total) by mouth daily.   oxyCODONE 30 MG 12 hr tablet Take 1 tablet (30 mg total) by mouth 2 (two) times daily.   oxyCODONE-acetaminophen 7.5-325 MG tablet Commonly known as:  PERCOCET Take 1 tablet by mouth every 4 (four) hours as needed. May take one extra tablet when pain is sever. No more than 4 a day   pantoprazole 40 MG tablet Commonly known as:  PROTONIX Take 1 tablet (40 mg total) by mouth 2 (two) times daily before a meal.   pilocarpine 5 MG tablet Commonly known as:  SALAGEN Take 1 tablet (5 mg total) by mouth 2 (two) times daily.   scopolamine 1 MG/3DAYS Commonly known as:  TRANSDERM-SCOP Place 1 patch (1.5 mg total) onto the skin every 3 (three) days.   sucralfate 1 GM/10ML suspension Commonly known as:  CARAFATE Take 10 mLs (1 g total) by mouth 4 (four) times daily -  with meals and at bedtime.       Allergies: No Known Allergies  Past Medical History, Surgical history, Social history, and Family History were reviewed and  updated.  Review of Systems: Review of Systems  All other systems reviewed and are negative.   Physical Exam:  weight is 120 lb (54.4 kg). His oral temperature is 98.3 F (36.8 C). His blood pressure is 148/65 (abnormal) and his pulse is 70. His respiration is 18 and oxygen saturation is 96%.   Wt Readings from Last 3 Encounters:  09/28/17 120 lb (54.4 kg)  09/21/17 119 lb 9.6 oz (54.3 kg)  08/22/17 120 lb (54.4 kg)    Physical Exam  Constitutional: He is oriented to person, place, and time.  HENT:  Head: Normocephalic and atraumatic.  Mouth/Throat: Oropharynx is clear and moist.  Eyes: Pupils are equal, round, and reactive to light. EOM are normal.  Neck: Normal range of motion.  Cardiovascular: Normal rate, regular rhythm and normal heart sounds.  Pulmonary/Chest:  Effort normal and breath sounds normal.  Abdominal: Soft. Bowel sounds are normal.  Musculoskeletal: Normal range of motion. He exhibits no edema, tenderness or deformity.  Lymphadenopathy:    He has no cervical adenopathy.  Neurological: He is alert and oriented to person, place, and time.  Skin: Skin is warm and dry. No rash noted. No erythema.  Psychiatric: He has a normal mood and affect. His behavior is normal. Judgment and thought content normal.  Vitals reviewed.    Lab Results  Component Value Date   WBC 4.7 08/10/2017   HGB 11.4 (L) 08/10/2017   HCT 33.1 (L) 08/10/2017   MCV 86.9 08/10/2017   PLT 213 08/10/2017   Lab Results  Component Value Date   FERRITIN 322 08/10/2017   IRON 47 08/10/2017   TIBC 328 08/10/2017   UIBC 280 08/10/2017   IRONPCTSAT 14 (L) 08/10/2017   Lab Results  Component Value Date   RBC 3.81 (L) 08/10/2017   No results found for: KPAFRELGTCHN, LAMBDASER, KAPLAMBRATIO No results found for: IGGSERUM, IGA, IGMSERUM No results found for: Kathrynn Ducking, MSPIKE, SPEI   Chemistry      Component Value Date/Time   NA 130 (L) 08/10/2017 1309   NA 138 01/20/2017 1324   NA 135 (L) 04/05/2016 1333   K 4.5 08/10/2017 1309   K 4.5 01/20/2017 1324   K 4.7 04/05/2016 1333   CL 98 08/10/2017 1309   CL 97 (L) 01/20/2017 1324   CO2 26 08/10/2017 1309   CO2 30 01/20/2017 1324   CO2 29 04/05/2016 1333   BUN 15 08/10/2017 1309   BUN 15 01/20/2017 1324   BUN 23.6 04/05/2016 1333   CREATININE 1.27 (H) 08/10/2017 1309   CREATININE 1.2 01/20/2017 1324   CREATININE 1.1 04/05/2016 1333      Component Value Date/Time   CALCIUM 8.9 08/10/2017 1309   CALCIUM 9.2 01/20/2017 1324   CALCIUM 9.2 04/05/2016 1333   ALKPHOS 46 08/10/2017 1309   ALKPHOS 51 01/20/2017 1324   ALKPHOS 67 04/05/2016 1333   AST 36 08/10/2017 1309   AST 17 04/05/2016 1333   ALT 57 (H) 08/10/2017 1309   ALT 26 01/20/2017 1324   ALT 10  04/05/2016 1333   BILITOT 0.3 08/10/2017 1309   BILITOT 0.30 04/05/2016 1333      Impression and Plan: Bruce Olson is a very pleasant 62 yo caucasian gentleman with history of squamous cell carcinoma of the left tonsil, HPV positive.  I am happy that the barium swallow okay.  I still not sure why he is having the issues that he has had.  It does  not sound like they are as bad right now.  I would like to see him back in November.  I want to get him back before the holidays so that we can see how he is doing and hopefully, he will be doing better.    [name], MD 8/29/201912:25 PM

## 2017-10-24 ENCOUNTER — Encounter: Payer: Medicare Other | Attending: Physical Medicine and Rehabilitation | Admitting: Registered Nurse

## 2017-10-24 ENCOUNTER — Other Ambulatory Visit: Payer: Self-pay

## 2017-10-24 ENCOUNTER — Encounter: Payer: Self-pay | Admitting: Registered Nurse

## 2017-10-24 VITALS — BP 159/67 | HR 66 | Ht 66.0 in | Wt 119.4 lb

## 2017-10-24 DIAGNOSIS — Z5181 Encounter for therapeutic drug level monitoring: Secondary | ICD-10-CM | POA: Diagnosis not present

## 2017-10-24 DIAGNOSIS — M961 Postlaminectomy syndrome, not elsewhere classified: Secondary | ICD-10-CM | POA: Diagnosis not present

## 2017-10-24 DIAGNOSIS — M7918 Myalgia, other site: Secondary | ICD-10-CM

## 2017-10-24 DIAGNOSIS — M5416 Radiculopathy, lumbar region: Secondary | ICD-10-CM | POA: Insufficient documentation

## 2017-10-24 DIAGNOSIS — G894 Chronic pain syndrome: Secondary | ICD-10-CM

## 2017-10-24 DIAGNOSIS — M542 Cervicalgia: Secondary | ICD-10-CM | POA: Insufficient documentation

## 2017-10-24 DIAGNOSIS — Z9889 Other specified postprocedural states: Secondary | ICD-10-CM | POA: Insufficient documentation

## 2017-10-24 DIAGNOSIS — Z79899 Other long term (current) drug therapy: Secondary | ICD-10-CM | POA: Diagnosis not present

## 2017-10-24 DIAGNOSIS — M5412 Radiculopathy, cervical region: Secondary | ICD-10-CM

## 2017-10-24 MED ORDER — OXYCODONE-ACETAMINOPHEN 7.5-325 MG PO TABS: 1.0000 | ORAL_TABLET | ORAL | 0 refills | Status: DC | PRN

## 2017-10-24 MED ORDER — OXYCODONE HCL ER 30 MG PO T12A: 30.0000 mg | EXTENDED_RELEASE_TABLET | Freq: Two times a day (BID) | ORAL | 0 refills | Status: DC

## 2017-10-24 NOTE — Progress Notes (Signed)
Subjective:    Patient ID: Bruce Olson, male    DOB: [birth year], 62 y.o.   MRN: [id number]  HPI: Bruce Olson is a 62 year old male who returns for follow up appointment for chronic pain and medication refill. He states his pain is located in his neck radiating into his right shoulder and lower back pain. He rates his pain 7. His current exercise regime is walking.   Bruce Olson Morphine Equivalent is 135.00 MME. Last Oral Swab was Performed on 09/21/2017, it was consistent.   Pain Inventory Average Pain 7 Pain Right Now 7 My pain is sharp, stabbing and tingling  In the last 24 hours, has pain interfered with the following? General activity 7 Relation with others 7 Enjoyment of life 6 What TIME of day is your pain at its worst? all Sleep (in general) Poor  Pain is worse with: walking, sitting and standing Pain improves with: heat/ice, medication and TENS Relief from Meds: 3  Mobility use a cane how many minutes can you walk? 1/4 mile ability to climb steps?  yes do you drive?  yes  Function disabled: date disabled 05/2010  Neuro/Psych weakness numbness tremor trouble walking  Prior Studies Any changes since last visit?  no  Physicians involved in your care Any changes since last visit?  no   Family History  Problem Relation Age of Onset  . Anesthesia problems Neg Hx   . Hypotension Neg Hx   . Malignant hyperthermia Neg Hx   . Pseudochol deficiency Neg Hx    Social History   Socioeconomic History  . Marital status: Single    Spouse name: Not on file  . Number of children: Not on file  . Years of education: Not on file  . Highest education level: Not on file  Occupational History  . Not on file  Social Needs  . Financial resource strain: Not on file  . Food insecurity:    Worry: Not on file    Inability: Not on file  . Transportation needs:    Medical: Not on file    Non-medical: Not on file  Tobacco Use  . Smoking status:  Current Every Day Smoker    Packs/day: 1.00    Years: 33.00    Pack years: 33.00    Types: Cigarettes  . Smokeless tobacco: Never Used  Substance and Sexual Activity  . Alcohol use: No  . Drug use: No  . Sexual activity: Never    Comment: back problems  Lifestyle  . Physical activity:    Days per week: Not on file    Minutes per session: Not on file  . Stress: Not on file  Relationships  . Social connections:    Talks on phone: Not on file    Gets together: Not on file    Attends religious service: Not on file    Active member of club or organization: Not on file    Attends meetings of clubs or organizations: Not on file    Relationship status: Not on file  Other Topics Concern  . Not on file  Social History Narrative  . Not on file   Past Surgical History:  Procedure Laterality Date  . BACK SURGERY  2013   Back surgery  (fusion)09/15/2010 & 2013  . BACK SURGERY  03-2013  . HARDWARE REMOVAL N/A 03/14/2013   Procedure: HARDWARE REMOVAL;  Surgeon: Eustace Moore, MD;  Location: [facility];  Service: Neurosurgery;  Laterality:  N/A;   Past Medical History:  Diagnosis Date  . Goals of care, counseling/discussion 04/07/2016  . Iron deficiency anemia due to chronic blood loss 10/21/2016  . 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 No pertinent past medical history   . Occupational injury 2013   resulted in back injury that led to surgery  . Thyroid cancer ([facility])    BP (!) 159/67   Pulse 66   Ht 5\' 6"  (1.676 m)   Wt 119 lb 6.4 oz (54.2 kg)   SpO2 93%   BMI 19.27 kg/m   Opioid Risk Score:   Fall Risk Score:  `1  Depression screen PHQ 2/9  Depression screen [facility] 2/9 10/24/2017 06/22/2017 04/20/2017 03/20/2017 11/21/2016 09/27/2016 08/25/2016  Decreased Interest 0 0 0 0 0 0 0  Down, Depressed, Hopeless 0 0 0 0 0 0 0  PHQ - 2 Score 0 0 0 0 0 0 0  Altered sleeping - - - - - - -  Tired, decreased energy - - - - - - -  Change in appetite - - - - - - -  Feeling bad or failure about  yourself  - - - - - - -  Trouble concentrating - - - - - - -  Moving slowly or fidgety/restless - - - - - - -  Suicidal thoughts - - - - - - -  PHQ-9 Score - - - - - - -    Review of Systems  Constitutional: Negative.   HENT: Negative.   Eyes: Negative.   Respiratory: Negative.   Cardiovascular: Negative.   Gastrointestinal: Negative.   Endocrine: Negative.   Genitourinary: Negative.   Musculoskeletal: Negative.   Skin: Negative.   Allergic/Immunologic: Negative.   Neurological: Negative.   Hematological: Negative.   Psychiatric/Behavioral: Negative.   All other systems reviewed and are negative.      Objective:   Physical Exam  Constitutional: He is oriented to person, place, and time. He appears well-developed and well-nourished.  HENT:  Head: Normocephalic and atraumatic.  Neck: Normal range of motion. Neck supple.  Cervical Paraspinal Tenderness: C-4-C-6 Decreased ROM with Lateral Flexion and Extension   Cardiovascular: Normal rate and regular rhythm.  Pulmonary/Chest: Effort normal and breath sounds normal.  Musculoskeletal:  Normal Muscle Bulk and Muscle Testing Reveals:  Upper Extremities: Full ROM and Muscle Strength 5/5 Lumbar Hypersensitivity Lower Extremities: Full ROM and Muscle Strength 5/5 Arises from chair with ease Narrow Based Gait  Neurological: He is alert and oriented to person, place, and time.  Skin: Skin is warm and dry.  Psychiatric: He has a normal mood and affect. His behavior is normal.  Nursing note and vitals reviewed.         Assessment & Plan:  1. Lumbar postlaminectomy syndrome status post lumbar fusion x3: His most recent surgery was 03/14/2013. He had hardware removal and bone allograft waist and L2-3. Continue to Monitor. Continue current Medication Regimen.10/24/2017 Refilled: oxyCODONE 7.5/325mg  one tablet every 4 hours as needed #100 and Oxycontin 30 mg one tablet every 12 hours #60.  We will continue the opioid monitoring  program, this consists of regular clinic visits, examinations, urine drug screen, pill counts as well as use of [place] Controlled Substance Reporting System. 2. Lumbar Radiculopathy: Continuecurrent medication regimen withLyrica. 10/24/2017 3. Insomnia: Continuecurrent medication regimen withTrazodone.10/24/2017 4. Nausea: Continue Zofran as needed. 10/24/2017 5. Cervical Spondylosis/ Cervicalgia/ Cervical Radiculitis: Continuecurrent medication regimen withLyrica. 10/24/2017  20 minutes of face to face patient care time was  spent during this visit. All questions were encouraged and answered.  F/U in 1 month

## 2017-11-23 ENCOUNTER — Encounter: Payer: Self-pay | Admitting: Registered Nurse

## 2017-11-23 ENCOUNTER — Encounter: Payer: Medicare Other | Attending: Physical Medicine and Rehabilitation | Admitting: Registered Nurse

## 2017-11-23 VITALS — BP 147/71 | HR 69 | Ht 67.0 in | Wt 121.0 lb

## 2017-11-23 DIAGNOSIS — Z9889 Other specified postprocedural states: Secondary | ICD-10-CM | POA: Diagnosis not present

## 2017-11-23 DIAGNOSIS — M7918 Myalgia, other site: Secondary | ICD-10-CM | POA: Diagnosis not present

## 2017-11-23 DIAGNOSIS — M5412 Radiculopathy, cervical region: Secondary | ICD-10-CM | POA: Diagnosis not present

## 2017-11-23 DIAGNOSIS — M542 Cervicalgia: Secondary | ICD-10-CM | POA: Insufficient documentation

## 2017-11-23 DIAGNOSIS — M961 Postlaminectomy syndrome, not elsewhere classified: Secondary | ICD-10-CM | POA: Insufficient documentation

## 2017-11-23 DIAGNOSIS — Z5181 Encounter for therapeutic drug level monitoring: Secondary | ICD-10-CM | POA: Diagnosis not present

## 2017-11-23 DIAGNOSIS — G894 Chronic pain syndrome: Secondary | ICD-10-CM

## 2017-11-23 DIAGNOSIS — M5416 Radiculopathy, lumbar region: Secondary | ICD-10-CM | POA: Insufficient documentation

## 2017-11-23 DIAGNOSIS — Z79899 Other long term (current) drug therapy: Secondary | ICD-10-CM | POA: Diagnosis not present

## 2017-11-23 MED ORDER — OXYCODONE HCL ER 30 MG PO T12A: 30.0000 mg | EXTENDED_RELEASE_TABLET | Freq: Two times a day (BID) | ORAL | 0 refills | Status: DC

## 2017-11-23 MED ORDER — OXYCODONE-ACETAMINOPHEN 7.5-325 MG PO TABS: 1.0000 | ORAL_TABLET | ORAL | 0 refills | Status: DC | PRN

## 2017-11-23 NOTE — Progress Notes (Signed)
Subjective:    Patient ID: Bruce Olson, male    DOB: November 09, 1955, 62 y.o.   MRN: [id number]  HPI: Bruce Olson is a 62 year old male who returns for follow up appointment for chronic pain and medication refill. He states his pain is located in his neck mainly right side radiating into his right shoulder, upper-lower back radiating into his right hip pain. He rates his pain 7. His current exercise regime is walking.   [name] Morphine Equivalent is 135.00 MME. Last Oral Swab was Performed on 09/21/2017, it was consistent.   Pain Inventory Average Pain 7 Pain Right Now 7 My pain is sharp, stabbing and aching  In the last 24 hours, has pain interfered with the following? General activity 7 Relation with others 7 Enjoyment of life 7 What TIME of day is your pain at its worst? all Sleep (in general) Poor  Pain is worse with: walking, sitting and standing Pain improves with: heat/ice, medication and TENS Relief from Meds: 3  Mobility walk without assistance use a cane ability to climb steps?  yes do you drive?  yes  Function disabled: date disabled 2012  Neuro/Psych weakness numbness tremor trouble walking  Prior Studies Any changes since last visit?  no  Physicians involved in your care Any changes since last visit?  no   Family History  Problem Relation Age of Onset  . Anesthesia problems Neg Hx   . Hypotension Neg Hx   . Malignant hyperthermia Neg Hx   . Pseudochol deficiency Neg Hx    Social History   Socioeconomic History  . Marital status: Single    Spouse name: Not on file  . Number of children: Not on file  . Years of education: Not on file  . Highest education level: Not on file  Occupational History  . Not on file  Social Needs  . Financial resource strain: Not on file  . Food insecurity:    Worry: Not on file    Inability: Not on file  . Transportation needs:    Medical: Not on file    Non-medical: Not on file    Tobacco Use  . Smoking status: Current Every Day Smoker    Packs/day: 1.00    Years: 33.00    Pack years: 33.00    Types: Cigarettes  . Smokeless tobacco: Never Used  Substance and Sexual Activity  . Alcohol use: No  . Drug use: No  . Sexual activity: Never    Comment: back problems  Lifestyle  . Physical activity:    Days per week: Not on file    Minutes per session: Not on file  . Stress: Not on file  Relationships  . Social connections:    Talks on phone: Not on file    Gets together: Not on file    Attends religious service: Not on file    Active member of club or organization: Not on file    Attends meetings of clubs or organizations: Not on file    Relationship status: Not on file  Other Topics Concern  . Not on file  Social History Narrative  . Not on file   Past Surgical History:  Procedure Laterality Date  . BACK SURGERY  2013   Back surgery  (fusion)09/15/2010 & 2013  . BACK SURGERY  03-2013  . HARDWARE REMOVAL N/A 03/14/2013   Procedure: HARDWARE REMOVAL;  Surgeon: Eustace Moore, MD;  Location: [facility];  Service:  Neurosurgery;  Laterality: N/A;   Past Medical History:  Diagnosis Date  . Goals of care, counseling/discussion 04/07/2016  . Iron deficiency anemia due to chronic blood loss 10/21/2016  . 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 No pertinent past medical history   . Occupational injury 2013   resulted in back injury that led to surgery  . Thyroid cancer ([place])    There were no vitals taken for this visit.  Opioid Risk Score:   Fall Risk Score:  `1  Depression screen PHQ 2/9  Depression screen [facility] 2/9 10/24/2017 06/22/2017 04/20/2017 03/20/2017 11/21/2016 09/27/2016 08/25/2016  Decreased Interest 0 0 0 0 0 0 0  Down, Depressed, Hopeless 0 0 0 0 0 0 0  PHQ - 2 Score 0 0 0 0 0 0 0  Altered sleeping - - - - - - -  Tired, decreased energy - - - - - - -  Change in appetite - - - - - - -  Feeling bad or failure about yourself  - - - - - - -  Trouble  concentrating - - - - - - -  Moving slowly or fidgety/restless - - - - - - -  Suicidal thoughts - - - - - - -  PHQ-9 Score - - - - - - -    Review of Systems  Constitutional: Negative.   HENT: Negative.   Eyes: Negative.   Respiratory: Negative.   Cardiovascular: Negative.   Gastrointestinal: Negative.   Endocrine: Negative.   Genitourinary: Negative.   Musculoskeletal: Positive for arthralgias, back pain and myalgias.  Skin: Negative.   Allergic/Immunologic: Negative.   Neurological: Negative.   Hematological: Negative.   Psychiatric/Behavioral: Negative.   All other systems reviewed and are negative.      Objective:   Physical Exam  Constitutional: He is oriented to person, place, and time. He appears well-developed and well-nourished.  HENT:  Head: Normocephalic and atraumatic.  Neck: Normal range of motion. Neck supple.  Cervical Paraspinal Tenderness: C-5-C-6 Mainly Right Side  Cardiovascular: Normal rate and regular rhythm.  Pulmonary/Chest: Effort normal and breath sounds normal.  Musculoskeletal:  Normal Muscle Bulk and Muscle testing Reveals: Upper Extremities: Full ROM and Muscle Strength 5/5 Right AC Joint Tenderness Thoracic Hypersensitivity: T-1-T-7 Mainly Right Side Lumbar Paraspinal Tenderness: L-3-L-5 Lower Extremities: Full ROM and Muscle Strength 5/5 Arises from Table Slowly Narrow Based gait  Neurological: He is alert and oriented to person, place, and time.  Skin: Skin is warm and dry.  Psychiatric: He has a normal mood and affect. His behavior is normal.  Nursing note and vitals reviewed.         Assessment & Plan:  1. Lumbar postlaminectomy syndrome status post lumbar fusion x3: His most recent surgery was 03/14/2013. He had hardware removal and bone allograft waist and L2-3. Continue to Monitor. Continue current Medication Regimen.11/23/2017 Refilled: oxyCODONE 7.5/325mg  one tablet every 4 hours as needed #100 and Oxycontin 30 mg one  tablet every 12 hours #60.  We will continue the opioid monitoring program, this consists of regular clinic visits, examinations, urine drug screen, pill counts as well as use of [place] Controlled Substance Reporting System. 2. Lumbar Radiculopathy: Continuecurrent medication regimen withLyrica. 11/23/2017 3. Insomnia: Continuecurrent medication regimen withTrazodone.11/23/2017  4. Nausea: Continue Zofran as needed. 11/23/2017 5. Cervical Spondylosis/ Cervicalgia/ Cervical Radiculitis: Continuecurrent medication regimen withLyrica. 11/23/2017  20 minutes of face to face patient care time was spent during this visit. All questions were encouraged and answered.  F/U in 1 month

## 2017-11-30 ENCOUNTER — Other Ambulatory Visit: Payer: Self-pay | Admitting: Hematology & Oncology

## 2017-11-30 DIAGNOSIS — R634 Abnormal weight loss: Secondary | ICD-10-CM

## 2017-11-30 DIAGNOSIS — C090 Malignant neoplasm of tonsillar fossa: Secondary | ICD-10-CM

## 2017-12-06 ENCOUNTER — Inpatient Hospital Stay: Payer: Medicare Other

## 2017-12-06 ENCOUNTER — Other Ambulatory Visit: Payer: Self-pay

## 2017-12-06 ENCOUNTER — Inpatient Hospital Stay: Payer: Medicare Other | Attending: Hematology & Oncology | Admitting: Hematology & Oncology

## 2017-12-06 VITALS — BP 150/67 | HR 60 | Temp 98.4°F | Resp 18 | Wt 123.0 lb

## 2017-12-06 DIAGNOSIS — Z79899 Other long term (current) drug therapy: Secondary | ICD-10-CM | POA: Diagnosis not present

## 2017-12-06 DIAGNOSIS — R1110 Vomiting, unspecified: Secondary | ICD-10-CM | POA: Diagnosis not present

## 2017-12-06 DIAGNOSIS — C090 Malignant neoplasm of tonsillar fossa: Secondary | ICD-10-CM

## 2017-12-06 DIAGNOSIS — Z923 Personal history of irradiation: Secondary | ICD-10-CM | POA: Insufficient documentation

## 2017-12-06 DIAGNOSIS — Z9221 Personal history of antineoplastic chemotherapy: Secondary | ICD-10-CM | POA: Diagnosis not present

## 2017-12-06 DIAGNOSIS — Z85818 Personal history of malignant neoplasm of other sites of lip, oral cavity, and pharynx: Secondary | ICD-10-CM | POA: Insufficient documentation

## 2017-12-06 LAB — CMP (CANCER CENTER ONLY)
ALK PHOS: 46 U/L (ref 26–84)
ALT: 34 U/L (ref 10–47)
ANION GAP: 7 (ref 5–15)
AST: 28 U/L (ref 11–38)
Albumin: 3.7 g/dL (ref 3.5–5.0)
BILIRUBIN TOTAL: 0.4 mg/dL (ref 0.2–1.6)
BUN: 12 mg/dL (ref 7–22)
CHLORIDE: 96 mmol/L — AB (ref 98–108)
CO2: 30 mmol/L (ref 18–33)
Calcium: 9.5 mg/dL (ref 8.0–10.3)
Creatinine: 1.3 mg/dL — ABNORMAL HIGH (ref 0.60–1.20)
Glucose, Bld: 92 mg/dL (ref 73–118)
POTASSIUM: 5.1 mmol/L — AB (ref 3.3–4.7)
SODIUM: 133 mmol/L (ref 128–145)
Total Protein: 6.8 g/dL (ref 6.4–8.1)

## 2017-12-06 LAB — CBC WITH DIFFERENTIAL (CANCER CENTER ONLY)
ABS IMMATURE GRANULOCYTES: 0.02 10*3/uL (ref 0.00–0.07)
BASOS ABS: 0 10*3/uL (ref 0.0–0.1)
Basophils Relative: 1 %
Eosinophils Absolute: 0.1 10*3/uL (ref 0.0–0.5)
Eosinophils Relative: 3 %
HCT: 36.4 % — ABNORMAL LOW (ref 39.0–52.0)
Hemoglobin: 12 g/dL — ABNORMAL LOW (ref 13.0–17.0)
Immature Granulocytes: 0 %
Lymphocytes Relative: 26 %
Lymphs Abs: 1.3 10*3/uL (ref 0.7–4.0)
MCH: 29.1 pg (ref 26.0–34.0)
MCHC: 33 g/dL (ref 30.0–36.0)
MCV: 88.1 fL (ref 80.0–100.0)
Monocytes Absolute: 0.8 10*3/uL (ref 0.1–1.0)
Monocytes Relative: 16 %
NEUTROS ABS: 2.8 10*3/uL (ref 1.7–7.7)
NEUTROS PCT: 54 %
NRBC: 0 % (ref 0.0–0.2)
PLATELETS: 237 10*3/uL (ref 150–400)
RBC: 4.13 MIL/uL — AB (ref 4.22–5.81)
RDW: 13.4 % (ref 11.5–15.5)
WBC: 5.2 10*3/uL (ref 4.0–10.5)

## 2017-12-06 MED ORDER — DOXEPIN HCL 6 MG PO TABS: 6.0000 mg | ORAL_TABLET | Freq: Every day | ORAL | 3 refills | Status: DC

## 2017-12-06 NOTE — Progress Notes (Signed)
Hematology and Oncology Follow Up Visit  Bruce Olson [id number] 1955/09/16 62 y.o. 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  Unfortunately, he still having some issues.  He is still having problems with vomiting in the morning.  He is on Reglan which does not seem to be helping.  He is on Protonix.  I thought maybe Marinol might help this.  He says he is not allowed to take Marinol because his pain doctor told him that.  I told him that when he sees his pain doctor, the pain doctor can call me about this issue.  He is not having any issues with pain.  He is on OxyContin and Percocet.  He still has issues sleeping.  He was on Remeron.  We will go ahead and stop this and try him on Silenor (6 mg p.o. nightly) and maybe this will help.  He has had no bleeding.  There is been no changes in bowels or bladder.  Overall, his performance status is ECOG 1.   Medications:  Allergies as of 12/06/2017   No Known Allergies     Medication List        Accurate as of 12/06/17  1:08 PM. Always use your most recent med list.          docusate sodium 100 MG capsule Commonly known as:  COLACE Take 100 mg by mouth daily.   LYRICA 100 MG capsule Generic drug:  pregabalin TAKE 1 CAPSULE BY MOUTH THREE TIMES A DAY   meclizine 25 MG tablet Commonly known as:  ANTIVERT Take 1 tablet (25 mg total) by mouth 3 (three) times daily as needed.   methocarbamol 500 MG tablet Commonly known as:  ROBAXIN Take 1 tablet (500 mg total) by mouth 2 (two) times daily as needed for muscle spasms.   metoCLOPramide 10 MG tablet Commonly known as:  REGLAN TAKE 1 TABLET BY MOUTH 4 TIMES DAILY BEFORE MEALS AND AT BEDTIME   mirtazapine 7.5 MG tablet Commonly known  as:  REMERON Take 1 tablet (7.5 mg total) by mouth at bedtime.   multivitamin with minerals Tabs tablet Take 1 tablet by mouth daily.   oxandrolone 10 MG tablet Commonly known as:  OXANDRIN TAKE 1 TABLET BY MOUTH EVERY DAY   oxyCODONE 30 MG 12 hr tablet Take 1 tablet (30 mg total) by mouth 2 (two) times daily.   oxyCODONE-acetaminophen 7.5-325 MG tablet Commonly known as:  PERCOCET Take 1 tablet by mouth every 4 (four) hours as needed. May take one extra tablet when pain is sever. No more than 4 a day   pantoprazole 40 MG tablet Commonly known as:  PROTONIX Take 1 tablet (40 mg total) by mouth 2 (two) times daily before a meal.   pilocarpine 5 MG tablet Commonly known as:  SALAGEN Take 1 tablet (5 mg total) by mouth 2 (two) times daily.   sucralfate 1 GM/10ML suspension Commonly known as:  CARAFATE Take 10 mLs (1 g total) by mouth 4 (four) times daily -  with meals and at bedtime.       Allergies: No Known Allergies  Past Medical History, Surgical history, Social history, and Family History were reviewed and updated.  Review of Systems: Review of Systems  Constitutional: Positive  for malaise/fatigue.  HENT: Positive for sore throat.   Eyes: Negative.   Respiratory: Negative.   Cardiovascular: Negative.   Gastrointestinal: Negative.   Genitourinary: Positive for dysuria.  Musculoskeletal: Positive for joint pain and myalgias.  Skin: Negative.   Neurological: Positive for sensory change.  Endo/Heme/Allergies: Negative.   All other systems reviewed and are negative.   Physical Exam:  weight is 123 lb (55.8 kg). His oral temperature is 98.4 F (36.9 C). His blood pressure is 150/67 (abnormal) and his pulse is 60. His respiration is 18 and oxygen saturation is 100%.   Wt Readings from Last 3 Encounters:  12/06/17 123 lb (55.8 kg)  11/23/17 121 lb (54.9 kg)  10/24/17 119 lb 6.4 oz (54.2 kg)    Physical Exam  Constitutional: He is oriented to person, place, and  time.  HENT:  Head: Normocephalic and atraumatic.  Mouth/Throat: Oropharynx is clear and moist.  Eyes: Pupils are equal, round, and reactive to light. EOM are normal.  Neck: Normal range of motion.  Cardiovascular: Normal rate, regular rhythm and normal heart sounds.  Pulmonary/Chest: Effort normal and breath sounds normal.  Abdominal: Soft. Bowel sounds are normal.  Musculoskeletal: Normal range of motion. He exhibits no edema, tenderness or deformity.  Lymphadenopathy:    He has no cervical adenopathy.  Neurological: He is alert and oriented to person, place, and time.  Skin: Skin is warm and dry. No rash noted. No erythema.  Psychiatric: He has a normal mood and affect. His behavior is normal. Judgment and thought content normal.  Vitals reviewed.    Lab Results  Component Value Date   WBC 5.2 12/06/2017   HGB 12.0 (L) 12/06/2017   HCT 36.4 (L) 12/06/2017   MCV 88.1 12/06/2017   PLT 237 12/06/2017   Lab Results  Component Value Date   FERRITIN 322 08/10/2017   IRON 47 08/10/2017   TIBC 328 08/10/2017   UIBC 280 08/10/2017   IRONPCTSAT 14 (L) 08/10/2017   Lab Results  Component Value Date   RBC 4.13 (L) 12/06/2017   No results found for: KPAFRELGTCHN, LAMBDASER, KAPLAMBRATIO No results found for: Kandis Cocking, IGMSERUM No results found for: Odetta Pink, SPEI   Chemistry      Component Value Date/Time   NA 133 12/06/2017 1150   NA 138 01/20/2017 1324   NA 135 (L) 04/05/2016 1333   K 5.1 (H) 12/06/2017 1150   K 4.5 01/20/2017 1324   K 4.7 04/05/2016 1333   CL 96 (L) 12/06/2017 1150   CL 97 (L) 01/20/2017 1324   CO2 30 12/06/2017 1150   CO2 30 01/20/2017 1324   CO2 29 04/05/2016 1333   BUN 12 12/06/2017 1150   BUN 15 01/20/2017 1324   BUN 23.6 04/05/2016 1333   CREATININE 1.30 (H) 12/06/2017 1150   CREATININE 1.2 01/20/2017 1324   CREATININE 1.1 04/05/2016 1333      Component Value Date/Time    CALCIUM 9.5 12/06/2017 1150   CALCIUM 9.2 01/20/2017 1324   CALCIUM 9.2 04/05/2016 1333   ALKPHOS 46 12/06/2017 1150   ALKPHOS 51 01/20/2017 1324   ALKPHOS 67 04/05/2016 1333   AST 28 12/06/2017 1150   AST 17 04/05/2016 1333   ALT 34 12/06/2017 1150   ALT 26 01/20/2017 1324   ALT 10 04/05/2016 1333   BILITOT 0.4 12/06/2017 1150   BILITOT 0.30 04/05/2016 1333      Impression and Plan: Mr. Achille is a very  pleasant 62 yo caucasian gentleman with history of squamous cell carcinoma of the left tonsil, HPV positive.  His quality life still has some him to be desired.  We are trying her best to help improve his quality of life.  There is not any issues with recurrent cancer which is wonderful.  The side effects that he has had from his cancer treatments have been a problem.  We will still try to do what we can to help him out.  Maybe the Silenor will help with sleep.  We will plan to see him back in another 6 weeks.  By then, he were to see his pain specialist and will know if he can be put on Marinol.  [name], MD 11/6/20191:08 PM

## 2017-12-22 ENCOUNTER — Other Ambulatory Visit: Payer: Self-pay

## 2017-12-22 ENCOUNTER — Encounter: Payer: Self-pay | Admitting: Registered Nurse

## 2017-12-22 ENCOUNTER — Encounter: Payer: Medicare Other | Attending: Physical Medicine and Rehabilitation | Admitting: Registered Nurse

## 2017-12-22 VITALS — BP 140/73 | HR 65 | Ht 67.0 in | Wt 125.0 lb

## 2017-12-22 DIAGNOSIS — M542 Cervicalgia: Secondary | ICD-10-CM | POA: Insufficient documentation

## 2017-12-22 DIAGNOSIS — M7918 Myalgia, other site: Secondary | ICD-10-CM | POA: Diagnosis not present

## 2017-12-22 DIAGNOSIS — M5412 Radiculopathy, cervical region: Secondary | ICD-10-CM | POA: Diagnosis not present

## 2017-12-22 DIAGNOSIS — Z9889 Other specified postprocedural states: Secondary | ICD-10-CM | POA: Insufficient documentation

## 2017-12-22 DIAGNOSIS — Z79899 Other long term (current) drug therapy: Secondary | ICD-10-CM | POA: Insufficient documentation

## 2017-12-22 DIAGNOSIS — G894 Chronic pain syndrome: Secondary | ICD-10-CM | POA: Diagnosis not present

## 2017-12-22 DIAGNOSIS — Z5181 Encounter for therapeutic drug level monitoring: Secondary | ICD-10-CM | POA: Insufficient documentation

## 2017-12-22 DIAGNOSIS — M961 Postlaminectomy syndrome, not elsewhere classified: Secondary | ICD-10-CM | POA: Insufficient documentation

## 2017-12-22 DIAGNOSIS — M5416 Radiculopathy, lumbar region: Secondary | ICD-10-CM | POA: Insufficient documentation

## 2017-12-22 MED ORDER — OXYCODONE HCL ER 30 MG PO T12A: 30.0000 mg | EXTENDED_RELEASE_TABLET | Freq: Two times a day (BID) | ORAL | 0 refills | Status: DC

## 2017-12-22 MED ORDER — OXYCODONE-ACETAMINOPHEN 7.5-325 MG PO TABS: 1.0000 | ORAL_TABLET | ORAL | 0 refills | Status: DC | PRN

## 2017-12-22 NOTE — Progress Notes (Signed)
Subjective:    Patient ID: Bruce Olson, male    DOB: December 16, 1955, 62 y.o.   MRN: [id number]  HPI: [name] is a 62 year old male who returns for follow up appointment for chronic pain and medication refill. He states his pain is located in his neck mainly right side radiating into his right shoulder, upper back mainly right side and lower back pain. He rates his pain 7. His current exercise regime is walking.   [name] reports Dr. Marin Olson his oncologist wants to prescribe him Marinol, this was discussed with Dr. Letta Olson, he doesn't have a problem with the above. [name] understanding.   [name] Morphine Equivalent is 135.00 MME. Last Oral Swab was Performed on 09/22/2107, it was consistent.    Pain Inventory Average Pain 7 Pain Right Now 7 My pain is sharp, stabbing and tingling  In the last 24 hours, has pain interfered with the following? General activity 7 Relation with others 7 Enjoyment of life 7 What TIME of day is your pain at its worst? all Sleep (in general) Poor  Pain is worse with: walking, sitting and standing Pain improves with: heat/ice, medication and TENS Relief from Meds: 3  Mobility walk without assistance use a cane how many minutes can you walk? 1/4 mile ability to climb steps?  yes do you drive?  yes  Function disabled: date disabled 05/2010  Neuro/Psych weakness numbness tremor trouble walking  Prior Studies Any changes since last visit?  no x-rays CT/MRI  Physicians involved in your care Any changes since last visit?  no   Family History  Problem Relation Age of Onset  . Anesthesia problems Neg Hx   . Hypotension Neg Hx   . Malignant hyperthermia Neg Hx   . Pseudochol deficiency Neg Hx    Social History   Socioeconomic History  . Marital status: Single    Spouse name: Not on file  . Number of children: Not on file  . Years of education: Not on file  . Highest education  level: Not on file  Occupational History  . Not on file  Social Needs  . Financial resource strain: Not on file  . Food insecurity:    Worry: Not on file    Inability: Not on file  . Transportation needs:    Medical: Not on file    Non-medical: Not on file  Tobacco Use  . Smoking status: Current Every Day Smoker    Packs/day: 1.00    Years: 33.00    Pack years: 33.00    Types: Cigarettes  . Smokeless tobacco: Never Used  Substance and Sexual Activity  . Alcohol use: No  . Drug use: No  . Sexual activity: Never    Comment: back problems  Lifestyle  . Physical activity:    Days per week: Not on file    Minutes per session: Not on file  . Stress: Not on file  Relationships  . Social connections:    Talks on phone: Not on file    Gets together: Not on file    Attends religious service: Not on file    Active member of club or organization: Not on file    Attends meetings of clubs or organizations: Not on file    Relationship status: Not on file  Other Topics Concern  . Not on file  Social History Narrative  . Not on file   Past Surgical History:  Procedure Laterality Date  .  BACK SURGERY  2013   Back surgery  (fusion)09/15/2010 & 2013  . BACK SURGERY  03-2013  . HARDWARE REMOVAL N/A 03/14/2013   Procedure: HARDWARE REMOVAL;  Surgeon: Bruce Moore, MD;  Location: [facility];  Service: Neurosurgery;  Laterality: N/A;   Past Medical History:  Diagnosis Date  . Goals of care, counseling/discussion 04/07/2016  . Iron deficiency anemia due to chronic blood loss 10/21/2016  . 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 No pertinent past medical history   . Occupational injury 2013   resulted in back injury that led to surgery  . Thyroid cancer (HCC)    BP 140/73   Pulse 65   Ht 5\' 7"  (1.702 m)   Wt 125 lb (56.7 kg)   SpO2 92%   BMI 19.58 kg/m   Opioid Risk Score:   Fall Risk Score:  `1  Depression screen PHQ 2/9  Depression screen [facility] 2/9 12/22/2017 10/24/2017 06/22/2017  04/20/2017 03/20/2017 11/21/2016 09/27/2016  Decreased Interest 0 0 0 0 0 0 0  Down, Depressed, Hopeless 0 0 0 0 0 0 0  PHQ - 2 Score 0 0 0 0 0 0 0  Altered sleeping - - - - - - -  Tired, decreased energy - - - - - - -  Change in appetite - - - - - - -  Feeling bad or failure about yourself  - - - - - - -  Trouble concentrating - - - - - - -  Moving slowly or fidgety/restless - - - - - - -  Suicidal thoughts - - - - - - -  PHQ-9 Score - - - - - - -     Review of Systems  Constitutional: Negative.   HENT: Negative.   Eyes: Negative.   Respiratory: Negative.   Cardiovascular: Negative.   Gastrointestinal: Negative.   Endocrine: Negative.   Genitourinary: Negative.   Musculoskeletal: Negative.   Skin: Negative.   Allergic/Immunologic: Negative.   Neurological: Negative.   Hematological: Negative.   Psychiatric/Behavioral: Negative.   All other systems reviewed and are negative.      Objective:   Physical Exam  Constitutional: He is oriented to person, place, and time. He appears well-developed and well-nourished.  HENT:  Head: Normocephalic and atraumatic.  Neck: Normal range of motion. Neck supple.  Cervical Paraspinal Tenderness: C-4-C-6 Mainly Right Side  Cardiovascular: Normal rate and regular rhythm.  Pulmonary/Chest: Effort normal and breath sounds normal.  Musculoskeletal:  Normal Muscle Bulk and Muscle Testing Reveals: Upper Extremities: Full ROM and Muscle Strength 5/5 Right AC Joint Tenderness Thoracic Paraspinal Tenderness: T-1-T-4 Mainly Right Side Lower Extremities: Full ROM and Muscle Strength 5/5 Arises from Table slowly Narrow Based gait  Neurological: He is alert and oriented to person, place, and time.  Skin: Skin is warm and dry.  Psychiatric: He has a normal mood and affect. His behavior is normal.  Nursing note and vitals reviewed.         Assessment & Plan:  1. Lumbar postlaminectomy syndrome status post lumbar fusion x3: His most recent  surgery was 03/14/2013. He had hardware removal and bone allograft waist and L2-3. Continue to Monitor. Continue current Medication Regimen.12/22/2017 Refilled: oxyCODONE 7.5/325mg  one tablet every 4 hours as needed #100 and Oxycontin 30 mg one tablet every 12 hours #60.  We will continue the opioid monitoring program, this consists of regular clinic visits, examinations, urine drug screen, pill counts as well as use of [place] Controlled Substance  Reporting System. 2. Lumbar Radiculopathy: Continuecurrent medication regimen withLyrica. 12/22/2017 3. Insomnia: Continuecurrent medication regimen withTrazodone.12/22/2017  4. Nausea: Continue Zofran as needed.Oncology Following.  12/22/2017 5. Cervical Spondylosis/ Cervicalgia/ Cervical Radiculitis: Continuecurrent medication regimen withLyrica. 12/22/2017  20 minutes of face to face patient care time was spent during this visit. All questions were encouraged and answered.  F/U in 1 month

## 2017-12-27 ENCOUNTER — Other Ambulatory Visit: Payer: Self-pay | Admitting: *Deleted

## 2017-12-27 DIAGNOSIS — Z8719 Personal history of other diseases of the digestive system: Secondary | ICD-10-CM

## 2017-12-27 DIAGNOSIS — R634 Abnormal weight loss: Secondary | ICD-10-CM

## 2017-12-27 DIAGNOSIS — C090 Malignant neoplasm of tonsillar fossa: Secondary | ICD-10-CM

## 2017-12-27 MED ORDER — OXANDROLONE 10 MG PO TABS: 10.0000 mg | ORAL_TABLET | Freq: Every day | ORAL | 3 refills | Status: DC

## 2017-12-27 MED ORDER — METOCLOPRAMIDE HCL 10 MG PO TABS: ORAL_TABLET | ORAL | 2 refills | Status: DC

## 2018-01-16 ENCOUNTER — Other Ambulatory Visit: Payer: Self-pay | Admitting: *Deleted

## 2018-01-16 DIAGNOSIS — C090 Malignant neoplasm of tonsillar fossa: Secondary | ICD-10-CM

## 2018-01-16 DIAGNOSIS — D500 Iron deficiency anemia secondary to blood loss (chronic): Secondary | ICD-10-CM

## 2018-01-17 ENCOUNTER — Inpatient Hospital Stay: Payer: Medicare Other | Attending: Hematology & Oncology

## 2018-01-17 ENCOUNTER — Other Ambulatory Visit: Payer: Self-pay

## 2018-01-17 ENCOUNTER — Encounter: Payer: Self-pay | Admitting: Hematology & Oncology

## 2018-01-17 ENCOUNTER — Inpatient Hospital Stay (HOSPITAL_BASED_OUTPATIENT_CLINIC_OR_DEPARTMENT_OTHER): Payer: Medicare Other | Admitting: Hematology & Oncology

## 2018-01-17 VITALS — BP 158/67 | HR 82 | Temp 98.1°F | Resp 20 | Wt 127.8 lb

## 2018-01-17 DIAGNOSIS — Z9221 Personal history of antineoplastic chemotherapy: Secondary | ICD-10-CM | POA: Insufficient documentation

## 2018-01-17 DIAGNOSIS — Z79899 Other long term (current) drug therapy: Secondary | ICD-10-CM | POA: Diagnosis not present

## 2018-01-17 DIAGNOSIS — C090 Malignant neoplasm of tonsillar fossa: Secondary | ICD-10-CM

## 2018-01-17 DIAGNOSIS — Z85818 Personal history of malignant neoplasm of other sites of lip, oral cavity, and pharynx: Secondary | ICD-10-CM | POA: Insufficient documentation

## 2018-01-17 DIAGNOSIS — Z923 Personal history of irradiation: Secondary | ICD-10-CM | POA: Insufficient documentation

## 2018-01-17 DIAGNOSIS — D500 Iron deficiency anemia secondary to blood loss (chronic): Secondary | ICD-10-CM

## 2018-01-17 DIAGNOSIS — R300 Dysuria: Secondary | ICD-10-CM | POA: Diagnosis not present

## 2018-01-17 DIAGNOSIS — R5381 Other malaise: Secondary | ICD-10-CM | POA: Insufficient documentation

## 2018-01-17 DIAGNOSIS — R1110 Vomiting, unspecified: Secondary | ICD-10-CM | POA: Insufficient documentation

## 2018-01-17 LAB — CBC WITH DIFFERENTIAL (CANCER CENTER ONLY)
Abs Immature Granulocytes: 0.03 10*3/uL (ref 0.00–0.07)
Basophils Absolute: 0 10*3/uL (ref 0.0–0.1)
Basophils Relative: 1 %
Eosinophils Absolute: 0.1 10*3/uL (ref 0.0–0.5)
Eosinophils Relative: 2 %
HCT: 37 % — ABNORMAL LOW (ref 39.0–52.0)
Hemoglobin: 12 g/dL — ABNORMAL LOW (ref 13.0–17.0)
Immature Granulocytes: 1 %
Lymphocytes Relative: 19 %
Lymphs Abs: 1.1 10*3/uL (ref 0.7–4.0)
MCH: 29.6 pg (ref 26.0–34.0)
MCHC: 32.4 g/dL (ref 30.0–36.0)
MCV: 91.1 fL (ref 80.0–100.0)
MONO ABS: 0.9 10*3/uL (ref 0.1–1.0)
MONOS PCT: 15 %
Neutro Abs: 3.5 10*3/uL (ref 1.7–7.7)
Neutrophils Relative %: 62 %
Platelet Count: 280 10*3/uL (ref 150–400)
RBC: 4.06 MIL/uL — ABNORMAL LOW (ref 4.22–5.81)
RDW: 13.3 % (ref 11.5–15.5)
WBC Count: 5.6 10*3/uL (ref 4.0–10.5)
nRBC: 0 % (ref 0.0–0.2)

## 2018-01-17 LAB — CMP (CANCER CENTER ONLY)
ALT: 28 U/L (ref 0–44)
AST: 23 U/L (ref 15–41)
Albumin: 4.5 g/dL (ref 3.5–5.0)
Alkaline Phosphatase: 46 U/L (ref 38–126)
Anion gap: 7 (ref 5–15)
BUN: 16 mg/dL (ref 8–23)
CO2: 31 mmol/L (ref 22–32)
Calcium: 9.3 mg/dL (ref 8.9–10.3)
Chloride: 100 mmol/L (ref 98–111)
Creatinine: 1.41 mg/dL — ABNORMAL HIGH (ref 0.61–1.24)
GFR, EST NON AFRICAN AMERICAN: 53 mL/min — AB (ref >60)
GFR, Est AFR Am: >60 mL/min (ref >60)
Glucose, Bld: 88 mg/dL (ref 70–99)
Potassium: 5.3 mmol/L — ABNORMAL HIGH (ref 3.5–5.1)
Sodium: 138 mmol/L (ref 135–145)
Total Bilirubin: 0.3 mg/dL (ref 0.3–1.2)
Total Protein: 6.7 g/dL (ref 6.5–8.1)

## 2018-01-17 MED ORDER — DRONABINOL 5 MG PO CAPS: 5.0000 mg | ORAL_CAPSULE | Freq: Two times a day (BID) | ORAL | 0 refills | Status: DC

## 2018-01-17 NOTE — Progress Notes (Signed)
Hematology and Oncology Follow Up Visit  Bruce Olson [id number] 1955/06/29 62 y.o. 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 is here today for follow-up.  Unfortunately, he still having some issues.  He still having issues with vomiting.  I do not know why he is having this.  He is seen gastroenterology.  We have all medications to try to help with this.  Again he still says that he is throwing up after he eats.  His weight is up 4 pounds.  His albumin is 4.5.  As such, he is got to be absorbing some food.  His pain doctor will allow him to use Marinol.  As such, I will try him on Marinol at 5 mg p.o. twice daily.  Maybe, this will help with the vomiting.  He has had no issues with pain.  He is on OxyContin and oxycodone.  He has had no issues with bleeding.  He has had some abdominal distention.  He has had no fever.  He has had no sweats.  Overall, his performance status is ECOG 1.     Medications:  Allergies as of 01/17/2018   No Known Allergies     Medication List       Accurate as of January 17, 2018  1:09 PM. Always use your most recent med list.        docusate sodium 100 MG capsule Commonly known as:  COLACE Take 100 mg by mouth daily.   Doxepin HCl 6 MG Tabs Commonly known as:  SILENOR Take 1 tablet (6 mg total) by mouth at bedtime.   LYRICA 100 MG capsule Generic drug:  pregabalin TAKE 1 CAPSULE BY MOUTH THREE TIMES A DAY   metoCLOPramide 10 MG tablet Commonly known as:  REGLAN TAKE 1 TABLET BY MOUTH 4 TIMES DAILY BEFORE MEALS AND AT BEDTIME   multivitamin with minerals Tabs tablet Take 1 tablet by mouth daily.   oxandrolone 10 MG tablet Commonly known as:  OXANDRIN Take 1 tablet (10 mg total) by mouth daily.   oxyCODONE 30 MG 12 hr  tablet Commonly known as:  OXYCONTIN Take 1 tablet (30 mg total) by mouth 2 (two) times daily.   oxyCODONE-acetaminophen 7.5-325 MG tablet Commonly known as:  PERCOCET Take 1 tablet by mouth every 4 (four) hours as needed. May take one extra tablet when pain is sever. No more than 4 a day   pantoprazole 40 MG tablet Commonly known as:  PROTONIX Take 1 tablet (40 mg total) by mouth 2 (two) times daily before a meal.   sucralfate 1 GM/10ML suspension Commonly known as:  CARAFATE Take 10 mLs (1 g total) by mouth 4 (four) times daily -  with meals and at bedtime.       Allergies: No Known Allergies  Past Medical History, Surgical history, Social history, and Family History were reviewed and updated.  Review of Systems: Review of Systems  Constitutional: Positive for malaise/fatigue.  HENT: Positive for sore throat.   Eyes: Negative.   Respiratory: Negative.   Cardiovascular: Negative.   Gastrointestinal: Negative.   Genitourinary: Positive for dysuria.  Musculoskeletal: Positive for joint pain and myalgias.  Skin: Negative.   Neurological: Positive for sensory change.  Endo/Heme/Allergies: Negative.   All other systems reviewed and are negative.  Physical Exam:  weight is 127 lb 12.8 oz (58 kg). His oral temperature is 98.1 F (36.7 C). His blood pressure is 158/67 (abnormal) and his pulse is 82. His respiration is 20 and oxygen saturation is 99%.   Wt Readings from Last 3 Encounters:  01/17/18 127 lb 12.8 oz (58 kg)  12/22/17 125 lb (56.7 kg)  12/06/17 123 lb (55.8 kg)    Physical Exam Vitals signs reviewed.  HENT:     Head: Normocephalic and atraumatic.  Eyes:     Pupils: Pupils are equal, round, and reactive to light.  Neck:     Musculoskeletal: Normal range of motion.  Cardiovascular:     Rate and Rhythm: Normal rate and regular rhythm.     Heart sounds: Normal heart sounds.  Pulmonary:     Effort: Pulmonary effort is normal.     Breath sounds: Normal  breath sounds.  Abdominal:     General: Bowel sounds are normal.     Palpations: Abdomen is soft.  Musculoskeletal: Normal range of motion.        General: No tenderness or deformity.  Lymphadenopathy:     Cervical: No cervical adenopathy.  Skin:    General: Skin is warm and dry.     Findings: No erythema or rash.  Neurological:     Mental Status: He is alert and oriented to person, place, and time.  Psychiatric:        Behavior: Behavior normal.        Thought Content: Thought content normal.        Judgment: Judgment normal.      Lab Results  Component Value Date   WBC 5.6 01/17/2018   HGB 12.0 (L) 01/17/2018   HCT 37.0 (L) 01/17/2018   MCV 91.1 01/17/2018   PLT 280 01/17/2018   Lab Results  Component Value Date   FERRITIN 322 08/10/2017   IRON 47 08/10/2017   TIBC 328 08/10/2017   UIBC 280 08/10/2017   IRONPCTSAT 14 (L) 08/10/2017   Lab Results  Component Value Date   RBC 4.06 (L) 01/17/2018   No results found for: KPAFRELGTCHN, LAMBDASER, KAPLAMBRATIO No results found for: IGGSERUM, IGA, IGMSERUM No results found for: Odetta Pink, SPEI   Chemistry      Component Value Date/Time   NA 138 01/17/2018 1156   NA 138 01/20/2017 1324   NA 135 (L) 04/05/2016 1333   K 5.3 (H) 01/17/2018 1156   K 4.5 01/20/2017 1324   K 4.7 04/05/2016 1333   CL 100 01/17/2018 1156   CL 97 (L) 01/20/2017 1324   CO2 31 01/17/2018 1156   CO2 30 01/20/2017 1324   CO2 29 04/05/2016 1333   BUN 16 01/17/2018 1156   BUN 15 01/20/2017 1324   BUN 23.6 04/05/2016 1333   CREATININE 1.41 (H) 01/17/2018 1156   CREATININE 1.2 01/20/2017 1324   CREATININE 1.1 04/05/2016 1333      Component Value Date/Time   CALCIUM 9.3 01/17/2018 1156   CALCIUM 9.2 01/20/2017 1324   CALCIUM 9.2 04/05/2016 1333   ALKPHOS 46 01/17/2018 1156   ALKPHOS 51 01/20/2017 1324   ALKPHOS 67 04/05/2016 1333   AST 23 01/17/2018 1156   AST 17 04/05/2016 1333     ALT 28 01/17/2018 1156   ALT 26 01/20/2017 1324   ALT 10 04/05/2016 1333   BILITOT 0.3 01/17/2018 1156   BILITOT 0.30 04/05/2016 1333      Impression  and Plan: [name] is a very pleasant 62 yo caucasian gentleman with history of squamous cell carcinoma of the left tonsil, HPV positive.  Hopefully, the Marinol will help him.  I do not see where his cancer has come back.  I do not see that we have to do any scans on him.  We will get him back now in 2 months.  [name], MD 12/18/20191:09 PM

## 2018-01-18 LAB — IRON AND TIBC
Iron: 63 ug/dL (ref 42–163)
Saturation Ratios: 16 % — ABNORMAL LOW (ref 20–55)
TIBC: 401 ug/dL (ref 202–409)
UIBC: 338 ug/dL (ref 117–376)

## 2018-01-18 LAB — FERRITIN: Ferritin: 241 ng/mL (ref 24–336)

## 2018-01-19 ENCOUNTER — Encounter: Payer: Medicare Other | Attending: Physical Medicine and Rehabilitation | Admitting: Registered Nurse

## 2018-01-19 ENCOUNTER — Encounter: Payer: Self-pay | Admitting: Registered Nurse

## 2018-01-19 VITALS — BP 134/70 | HR 65 | Resp 14 | Wt 126.0 lb

## 2018-01-19 DIAGNOSIS — Z79899 Other long term (current) drug therapy: Secondary | ICD-10-CM | POA: Insufficient documentation

## 2018-01-19 DIAGNOSIS — Z9889 Other specified postprocedural states: Secondary | ICD-10-CM

## 2018-01-19 DIAGNOSIS — M5416 Radiculopathy, lumbar region: Secondary | ICD-10-CM | POA: Diagnosis not present

## 2018-01-19 DIAGNOSIS — M542 Cervicalgia: Secondary | ICD-10-CM | POA: Diagnosis not present

## 2018-01-19 DIAGNOSIS — M5412 Radiculopathy, cervical region: Secondary | ICD-10-CM | POA: Diagnosis not present

## 2018-01-19 DIAGNOSIS — M7918 Myalgia, other site: Secondary | ICD-10-CM

## 2018-01-19 DIAGNOSIS — M961 Postlaminectomy syndrome, not elsewhere classified: Secondary | ICD-10-CM | POA: Diagnosis not present

## 2018-01-19 DIAGNOSIS — G894 Chronic pain syndrome: Secondary | ICD-10-CM

## 2018-01-19 DIAGNOSIS — Z5181 Encounter for therapeutic drug level monitoring: Secondary | ICD-10-CM | POA: Insufficient documentation

## 2018-01-19 MED ORDER — PREGABALIN 100 MG PO CAPS: ORAL_CAPSULE | ORAL | 5 refills | Status: DC

## 2018-01-19 MED ORDER — OXYCODONE HCL ER 30 MG PO T12A: 30.0000 mg | EXTENDED_RELEASE_TABLET | Freq: Two times a day (BID) | ORAL | 0 refills | Status: DC

## 2018-01-19 MED ORDER — OXYCODONE-ACETAMINOPHEN 7.5-325 MG PO TABS: 1.0000 | ORAL_TABLET | ORAL | 0 refills | Status: DC | PRN

## 2018-01-19 NOTE — Progress Notes (Signed)
Subjective:    Patient ID: Bruce Olson, male    DOB: November 07, 1955, 62 y.o.   MRN: [id number]  HPI: Bruce Olson is a 62 y.o. male who returns for follow up appointment for chronic pain and medication refill. He states his pain is located in his neck radiating into his right shoulder and mid-lower back pain. He rates his pain 7. His current exercise regime is walking..  Bruce Olson Morphine equivalent is 135.00 MME.  Last Oral Swab was Performed on 09/21/2017, it was consistent.   Pain Inventory Average Pain 7 Pain Right Now 7 My pain is sharp, stabbing and tingling  In the last 24 hours, has pain interfered with the following? General activity 7 Relation with others 7 Enjoyment of life 7 What TIME of day is your pain at its worst? all Sleep (in general) Poor  Pain is worse with: walking, sitting and standing Pain improves with: heat/ice, medication and TENS Relief from Meds: 3  Mobility walk without assistance use a cane ability to climb steps?  yes do you drive?  yes  Function disabled: date disabled .  Neuro/Psych weakness numbness tremor trouble walking  Prior Studies Any changes since last visit?  no  Physicians involved in your care Any changes since last visit?  no   Family History  Problem Relation Age of Onset  . Anesthesia problems Neg Hx   . Hypotension Neg Hx   . Malignant hyperthermia Neg Hx   . Pseudochol deficiency Neg Hx    Social History   Socioeconomic History  . Marital status: Single    Spouse name: Not on file  . Number of children: Not on file  . Years of education: Not on file  . Highest education level: Not on file  Occupational History  . Not on file  Social Needs  . Financial resource strain: Not on file  . Food insecurity:    Worry: Not on file    Inability: Not on file  . Transportation needs:    Medical: Not on file    Non-medical: Not on file  Tobacco Use  . Smoking status: Current Every Day Smoker    Packs/day: 1.00    Years: 33.00    Pack years: 33.00    Types: Cigarettes  . Smokeless tobacco: Never Used  Substance and Sexual Activity  . Alcohol use: No  . Drug use: No  . Sexual activity: Never    Comment: back problems  Lifestyle  . Physical activity:    Days per week: Not on file    Minutes per session: Not on file  . Stress: Not on file  Relationships  . Social connections:    Talks on phone: Not on file    Gets together: Not on file    Attends religious service: Not on file    Active member of club or organization: Not on file    Attends meetings of clubs or organizations: Not on file    Relationship status: Not on file  Other Topics Concern  . Not on file  Social History Narrative  . Not on file   Past Surgical History:  Procedure Laterality Date  . BACK SURGERY  2013   Back surgery  (fusion)09/15/2010 & 2013  . BACK SURGERY  03-2013  . HARDWARE REMOVAL N/A 03/14/2013   Procedure: HARDWARE REMOVAL;  Surgeon: Eustace Moore, MD;  Location: [place] [facility];  Service: Neurosurgery;  Laterality: N/A;   Past Medical History:  Diagnosis  Date  . Goals of care, counseling/discussion 04/07/2016  . Iron deficiency anemia due to chronic blood loss 10/21/2016  . 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 No pertinent past medical history   . Occupational injury 2013   resulted in back injury that led to surgery  . Thyroid cancer (HCC)    BP 134/70 (BP Location: Right Arm, Patient Position: Sitting, Cuff Size: Normal)   Pulse 65   Resp 14   Wt 126 lb (57.2 kg)   SpO2 92%   BMI 19.73 kg/m   Opioid Risk Score:   Fall Risk Score:  `1  Depression screen PHQ 2/9  Depression screen [facility] 2/9 12/22/2017 10/24/2017 06/22/2017 04/20/2017 03/20/2017 11/21/2016 09/27/2016  Decreased Interest 0 0 0 0 0 0 0  Down, Depressed, Hopeless 0 0 0 0 0 0 0  PHQ - 2 Score 0 0 0 0 0 0 0  Altered sleeping - - - - - - -  Tired, decreased energy - - - - - - -  Change in appetite - - - - - - -  Feeling bad  or failure about yourself  - - - - - - -  Trouble concentrating - - - - - - -  Moving slowly or fidgety/restless - - - - - - -  Suicidal thoughts - - - - - - -  PHQ-9 Score - - - - - - -    Review of Systems  Constitutional: Negative.   HENT: Negative.   Eyes: Negative.   Respiratory: Negative.   Cardiovascular: Negative.   Gastrointestinal: Negative.   Endocrine: Negative.   Genitourinary: Negative.   Musculoskeletal: Positive for arthralgias, back pain and gait problem.  Skin: Negative.   Allergic/Immunologic: Negative.   Neurological: Positive for tremors, weakness and numbness.  Hematological: Negative.   Psychiatric/Behavioral: Negative.        Objective:   Physical Exam Vitals signs and nursing note reviewed.  Constitutional:      Appearance: Normal appearance.  Neck:     Musculoskeletal: Normal range of motion and neck supple.  Cardiovascular:     Rate and Rhythm: Normal rate and regular rhythm.     Pulses: Normal pulses.     Heart sounds: Normal heart sounds.  Pulmonary:     Effort: Pulmonary effort is normal.     Breath sounds: Normal breath sounds.  Musculoskeletal:     Comments: Normal Muscle Bulk and Muscle Testing Reveals:  Upper Extremities: Full ROM and Muscle Strength 5/5 Right AC Joint Tenderness  Thoracic Paraspinal Tenderness: T-1-T-3 Mainly Right Side Lumbar Hypersensitivity  Lower Extremities: Full ROM and Muscle Strength 5/5 Arises from Table with Ease Narrow Based  Gait   Skin:    General: Skin is warm and dry.  Neurological:     Mental Status: He is alert and oriented to person, place, and time.  Psychiatric:        Mood and Affect: Mood normal.           Assessment & Plan:  1. Lumbar postlaminectomy syndrome status post lumbar fusion x3: His most recent surgery was 03/14/2013. He had hardware removal and bone allograft waist and L2-3. Continue to Monitor. Continue current Medication Regimen.01/19/2018 Refilled: oxyCODONE  7.5/325mg  one tablet every 4 hours as needed #100 and Oxycontin 30 mg one tablet every 12 hours #60.  We will continue the opioid monitoring program, this consists of regular clinic visits, examinations, urine drug screen, pill counts as well as use of [place]  Controlled Substance Reporting System. 2. Lumbar Radiculopathy: Continuecurrent medication regimen withLyrica.01/19/2018 3. Insomnia: Continuecurrent medication regimen withTrazodone.01/19/2018  4. Nausea: Continue Zofran as needed.Oncology Following. 01/19/2018 5. Cervical Spondylosis/ Cervicalgia/ Cervical Radiculitis: Continuecurrent medication regimen withLyrica. 01/19/2018 6. Cervical Myofascial Pain Syndrome: Continue current treatment regimen. Continue to monitor.   20 minutes of face to face patient care time was spent during this visit. All questions were encouraged and answered.  F/U in 1 month

## 2018-02-16 ENCOUNTER — Encounter: Payer: Medicare Other | Attending: Physical Medicine and Rehabilitation

## 2018-02-16 ENCOUNTER — Ambulatory Visit (HOSPITAL_BASED_OUTPATIENT_CLINIC_OR_DEPARTMENT_OTHER): Payer: Medicare Other | Admitting: Physical Medicine & Rehabilitation

## 2018-02-16 VITALS — BP 164/75 | HR 66 | Ht 67.0 in | Wt 128.0 lb

## 2018-02-16 DIAGNOSIS — Z5181 Encounter for therapeutic drug level monitoring: Secondary | ICD-10-CM | POA: Insufficient documentation

## 2018-02-16 DIAGNOSIS — M5416 Radiculopathy, lumbar region: Secondary | ICD-10-CM | POA: Diagnosis not present

## 2018-02-16 DIAGNOSIS — Z79899 Other long term (current) drug therapy: Secondary | ICD-10-CM | POA: Insufficient documentation

## 2018-02-16 DIAGNOSIS — M542 Cervicalgia: Secondary | ICD-10-CM

## 2018-02-16 DIAGNOSIS — Z9889 Other specified postprocedural states: Secondary | ICD-10-CM | POA: Diagnosis not present

## 2018-02-16 DIAGNOSIS — M961 Postlaminectomy syndrome, not elsewhere classified: Secondary | ICD-10-CM | POA: Diagnosis not present

## 2018-02-16 MED ORDER — OXYCODONE HCL ER 30 MG PO T12A: 30.0000 mg | EXTENDED_RELEASE_TABLET | Freq: Two times a day (BID) | ORAL | 0 refills | Status: DC

## 2018-02-16 MED ORDER — OXYCODONE-ACETAMINOPHEN 7.5-325 MG PO TABS: 1.0000 | ORAL_TABLET | ORAL | 0 refills | Status: DC | PRN

## 2018-02-16 NOTE — Patient Instructions (Signed)
Other pain medication option is Belbuca

## 2018-02-16 NOTE — Progress Notes (Signed)
Subjective:    Patient ID: Bruce Olson, male    DOB: Oct 21, 1955, 63 y.o.   MRN: [id number]  HPI   No improvement with botox for cervical dystonia Main pain is in low back Occ wears brace (when washing dishes) No bowel or bladder problems, uses stool softener Uses heating pad at night, has automatic shut off  Still smoking , tried quitting using patch bu tno success.  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 Bruce activity 7 Relation with others 7 Enjoyment of life 7 What TIME of day is your pain at its worst? all Sleep (in Bruce) Poor  Pain is worse with: walking, sitting and standing Pain improves with: heat/ice and TENS Relief from Meds: 3  Mobility walk without assistance use a cane ability to climb steps?  yes do you drive?  yes  Function disabled: date disabled 05/2014  Neuro/Psych weakness numbness tremor trouble walking  Prior Studies Any changes since last visit?  no x-rays CT/MRI  Physicians involved in your care Any changes since last visit?  no   Family History  Problem Relation Age of Onset  . Anesthesia problems Neg Hx   . Hypotension Neg Hx   . Malignant hyperthermia Neg Hx   . Pseudochol deficiency Neg Hx    Social History   Socioeconomic History  . Marital status: Single    Spouse name: Not on file  . Number of children: Not on file  . Years of education: Not on file  . Highest education level: Not on file  Occupational History  . Not on file  Social Needs  . Financial  resource strain: Not on file  . Food insecurity:    Worry: Not on file    Inability: Not on file  . Transportation needs:    Medical: Not on file    Non-medical: Not on file  Tobacco Use  . Smoking status: Current Every Day Smoker    Packs/day: 1.00    Years: 33.00    Pack years: 33.00    Types: Cigarettes  . Smokeless tobacco: Never Used  Substance and Sexual Activity  . Alcohol use: No  . Drug use: No  . Sexual activity: Never    Comment: back problems  Lifestyle  . Physical activity:    Days per week: Not on file    Minutes per session: Not on file  . Stress: Not on file  Relationships  . Social connections:    Talks on phone: Not on file    Gets together: Not on file    Attends religious service: Not on file    Active member of club or organization: Not on file    Attends meetings of clubs or organizations: Not on file    Relationship status: Not on file  Other Topics Concern  . Not on file  Social History Narrative  . Not on file   Past Surgical History:  Procedure Laterality Date  . BACK SURGERY  2013   Back surgery  (fusion)09/15/2010 & 2013  .  BACK SURGERY  03-2013  . HARDWARE REMOVAL N/A 03/14/2013   Procedure: HARDWARE REMOVAL;  Surgeon: Eustace Moore, MD;  Location: [place] [facility];  Service: Neurosurgery;  Laterality: N/A;   Past Medical History:  Diagnosis Date  . Goals of care, counseling/discussion 04/07/2016  . Iron deficiency anemia due to chronic blood loss 10/21/2016  . 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 No pertinent past medical history   . Occupational injury 2013   resulted in back injury that led to surgery  . Thyroid cancer (HCC)    BP (!) 164/75   Pulse 66   Ht 5\' 7"  (1.702 m)   Wt 128 lb (58.1 kg)   SpO2 96%   BMI 20.05 kg/m   Opioid Risk Score:   Fall Risk Score:  `1  Depression screen PHQ 2/9  Depression screen [facility] 2/9 02/16/2018 12/22/2017 10/24/2017 06/22/2017 04/20/2017 03/20/2017 11/21/2016  Decreased Interest 0 0 0 0 0 0 0  Down,  Depressed, Hopeless 0 0 0 0 0 0 0  PHQ - 2 Score 0 0 0 0 0 0 0  Altered sleeping - - - - - - -  Tired, decreased energy - - - - - - -  Change in appetite - - - - - - -  Feeling bad or failure about yourself  - - - - - - -  Trouble concentrating - - - - - - -  Moving slowly or fidgety/restless - - - - - - -  Suicidal thoughts - - - - - - -  PHQ-9 Score - - - - - - -     Review of Systems  Constitutional: Negative.   HENT: Negative.   Eyes: Negative.   Respiratory: Negative.   Cardiovascular: Negative.   Gastrointestinal: Negative.   Endocrine: Negative.   Genitourinary: Negative.   Musculoskeletal: Negative.   Skin: Negative.   Allergic/Immunologic: Negative.   Neurological: Negative.   Hematological: Negative.   Psychiatric/Behavioral: Negative.   All other systems reviewed and are negative.      Objective:   Physical Exam Vitals signs and nursing note reviewed.  Constitutional:      Appearance: Normal appearance. He is ill-appearing.  HENT:     Nose: Nose normal.     Mouth/Throat:     Mouth: Mucous membranes are moist.  Eyes:     Extraocular Movements: Extraocular movements intact.     Conjunctiva/sclera: Conjunctivae normal.  Musculoskeletal:     Comments: No tenderness to palpation in the cervical thoracic or lumbar paraspinal area.  There is no evidence of scoliosis but there is a mild thoracic kyphosis.   Skin:    Bruce: Skin is warm and dry.  Neurological:     Mental Status: He is alert and oriented to person, place, and time.     Comments: Motor strength is 5/5 bilateral deltoid, bicep, tricep, grip, hip flexor, knee extensor, ankle dorsiflexor and plantar flexor Gait is without evidence of toe drag or knee instability  Psychiatric:        Mood and Affect: Mood normal.        Behavior: Behavior normal.           Assessment & Plan:  1.  Lumbar postlaminectomy syndrome with L4-S1 fusion status post hardware removal in 2015.  He has no radicular  symptomatology.  He has been managed on high-dose narcotic analgesics with expected degree of pain relief.  His pain scores have been stable at 7/10 for months. We discussed  that there are other treatment options besides the oxycodone.  He may be switching medications due to insurance coverage.  He may require a abuse deterrent formulation.  He has had no signs of abuse or misuse of his medications his urine drug screen have been consistent.  PDMP does not appear to be listing him when searching database today.  His main side effect of medication is constipation which he does managed with stool softeners. At this time we will continue OxyContin 30 mg every 12 hour and oxycodone immediate release Oxycodone 7.5 mg 3 to 4 tablets/day If the patient is switched to xtamza would use 27mg  BID Or if discontinuing Oxy IR  Can use 36mg  BID  Other options include belbuca which would be a schedule 3 med

## 2018-02-28 ENCOUNTER — Other Ambulatory Visit: Payer: Self-pay | Admitting: *Deleted

## 2018-02-28 DIAGNOSIS — Z8719 Personal history of other diseases of the digestive system: Secondary | ICD-10-CM

## 2018-02-28 MED ORDER — METOCLOPRAMIDE HCL 10 MG PO TABS: ORAL_TABLET | ORAL | 2 refills | Status: DC

## 2018-03-02 ENCOUNTER — Other Ambulatory Visit: Payer: Self-pay | Admitting: Family

## 2018-03-02 NOTE — Progress Notes (Signed)
Insurance denied approval and coverage of Oxandrin. Prescription re moved per Dr. Marin Olp request.

## 2018-03-09 ENCOUNTER — Encounter: Payer: Medicare Other | Attending: Physical Medicine and Rehabilitation | Admitting: Registered Nurse

## 2018-03-09 ENCOUNTER — Encounter: Payer: Self-pay | Admitting: Registered Nurse

## 2018-03-09 VITALS — BP 137/87 | HR 66 | Ht 67.0 in | Wt 124.0 lb

## 2018-03-09 DIAGNOSIS — Z79899 Other long term (current) drug therapy: Secondary | ICD-10-CM | POA: Diagnosis not present

## 2018-03-09 DIAGNOSIS — G894 Chronic pain syndrome: Secondary | ICD-10-CM

## 2018-03-09 DIAGNOSIS — Z5181 Encounter for therapeutic drug level monitoring: Secondary | ICD-10-CM

## 2018-03-09 DIAGNOSIS — M7918 Myalgia, other site: Secondary | ICD-10-CM | POA: Diagnosis not present

## 2018-03-09 DIAGNOSIS — M961 Postlaminectomy syndrome, not elsewhere classified: Secondary | ICD-10-CM

## 2018-03-09 DIAGNOSIS — M5416 Radiculopathy, lumbar region: Secondary | ICD-10-CM | POA: Diagnosis not present

## 2018-03-09 DIAGNOSIS — M542 Cervicalgia: Secondary | ICD-10-CM | POA: Diagnosis not present

## 2018-03-09 DIAGNOSIS — M5412 Radiculopathy, cervical region: Secondary | ICD-10-CM | POA: Diagnosis not present

## 2018-03-09 DIAGNOSIS — Z9889 Other specified postprocedural states: Secondary | ICD-10-CM

## 2018-03-09 MED ORDER — OXYCODONE HCL ER 30 MG PO T12A: 30.0000 mg | EXTENDED_RELEASE_TABLET | Freq: Two times a day (BID) | ORAL | 0 refills | Status: DC

## 2018-03-09 MED ORDER — OXYCODONE-ACETAMINOPHEN 7.5-325 MG PO TABS: 1.0000 | ORAL_TABLET | ORAL | 0 refills | Status: DC | PRN

## 2018-03-09 NOTE — Progress Notes (Signed)
Subjective:    Patient ID: Bruce Olson, male    DOB: Dec 24, 1955, 63 y.o.   MRN: [id number]  HPI: Bruce Olson is a 63 y.o. male who returns for follow up appointment for chronic pain and medication refill. He states his pain is located in his neck radiating into his right shoulder and mid- lower back pain. He rates his pain 7.His current exercise regime is walking.   Bruce Olson lost 4 lbs this month, he reports his appetite stimulant is no longer covered by his insurance, he will be speaking to his oncologist he has a scheduled appointment  next week, he states.  Bruce Olson equivalent is 135.00 MME. Last Oral Swab was Performed on 09/21/2017. It was consistent. Oral Swab was Performed today.   Pain Inventory Average Pain 7 Pain Right Now 7 My pain is sharp, stabbing and tingling  In the last 24 hours, has pain interfered with the following? General activity 7 Relation with others 7 Enjoyment of life 7 What TIME of day is your pain at its worst? all Sleep (in general) Poor  Pain is worse with: walking, sitting and standing Pain improves with: rest, medication and TENS Relief from Meds: 3  Mobility walk without assistance use a cane ability to climb steps?  yes do you drive?  yes  Function not employed: date last employed 2012  Neuro/Psych weakness numbness tremor trouble walking  Prior Studies Any changes since last visit?  no  Physicians involved in your care Any changes since last visit?  no   Family History  Problem Relation Age of Onset  . Anesthesia problems Neg Hx   . Hypotension Neg Hx   . Malignant hyperthermia Neg Hx   . Pseudochol deficiency Neg Hx    Social History   Socioeconomic History  . Marital status: Single    Spouse name: Not on file  . Number of children: Not on file  . Years of education: Not on file  . Highest education level: Not on file  Occupational History  . Not on file  Social Needs  .  Financial resource strain: Not on file  . Food insecurity:    Worry: Not on file    Inability: Not on file  . Transportation needs:    Medical: Not on file    Non-medical: Not on file  Tobacco Use  . Smoking status: Current Every Day Smoker    Packs/day: 1.00    Years: 33.00    Pack years: 33.00    Types: Cigarettes  . Smokeless tobacco: Never Used  Substance and Sexual Activity  . Alcohol use: No  . Drug use: No  . Sexual activity: Never    Comment: back problems  Lifestyle  . Physical activity:    Days per week: Not on file    Minutes per session: Not on file  . Stress: Not on file  Relationships  . Social connections:    Talks on phone: Not on file    Gets together: Not on file    Attends religious service: Not on file    Active member of club or organization: Not on file    Attends meetings of clubs or organizations: Not on file    Relationship status: Not on file  Other Topics Concern  . Not on file  Social History Narrative  . Not on file   Past Surgical History:  Procedure Laterality Date  . BACK SURGERY  2013   Back surgery  (  fusion)09/15/2010 & 2013  . BACK SURGERY  03-2013  . HARDWARE REMOVAL N/A 03/14/2013   Procedure: HARDWARE REMOVAL;  Surgeon: Eustace Moore, MD;  Location: [place] [facility];  Service: Neurosurgery;  Laterality: N/A;   Past Medical History:  Diagnosis Date  . Goals of care, counseling/discussion 04/07/2016  . Iron deficiency anemia due to chronic blood loss 10/21/2016  . 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 No pertinent past medical history   . Occupational injury 2013   resulted in back injury that led to surgery  . Thyroid cancer (HCC)    BP 137/87   Pulse 66   Ht 5\' 7"  (1.702 m)   Wt 124 lb (56.2 kg)   SpO2 92%   BMI 19.42 kg/m   Opioid Risk Score:   Fall Risk Score:  `1  Depression screen PHQ 2/9  Depression screen [facility] 2/9 02/16/2018 12/22/2017 10/24/2017 06/22/2017 04/20/2017 03/20/2017 11/21/2016  Decreased Interest 0 0 0 0 0 0 0    Down, Depressed, Hopeless 0 0 0 0 0 0 0  PHQ - 2 Score 0 0 0 0 0 0 0  Altered sleeping - - - - - - -  Tired, decreased energy - - - - - - -  Change in appetite - - - - - - -  Feeling bad or failure about yourself  - - - - - - -  Trouble concentrating - - - - - - -  Moving slowly or fidgety/restless - - - - - - -  Suicidal thoughts - - - - - - -  PHQ-9 Score - - - - - - -     Review of Systems  Constitutional: Negative.   HENT: Negative.   Eyes: Negative.   Respiratory: Negative.   Cardiovascular: Negative.   Gastrointestinal: Negative.   Endocrine: Negative.   Genitourinary: Negative.   Musculoskeletal: Positive for arthralgias, gait problem and myalgias.  Skin: Negative.   Allergic/Immunologic: Negative.   Neurological: Positive for tremors, weakness and numbness.  Hematological: Negative.   Psychiatric/Behavioral: Negative.   All other systems reviewed and are negative.      Objective:   Physical Exam Vitals signs and nursing note reviewed.  Constitutional:      Appearance: Normal appearance.  Neck:     Musculoskeletal: Normal range of motion and neck supple.     Comments: Cervical Paraspinal Tenderness: C-5-C-6 Cardiovascular:     Rate and Rhythm: Normal rate and regular rhythm.     Pulses: Normal pulses.     Heart sounds: Normal heart sounds.  Pulmonary:     Effort: Pulmonary effort is normal.     Breath sounds: Normal breath sounds.  Musculoskeletal:     Comments: Normal Muscle Bulk and Muscle Testing Reveals:  Upper Extremities: Full ROM and Muscle Strength 5/5 Right AC Joint Tenderness   Thoracic Hypersensitivity: T-10- T-12  Lumbar Paraspinal Tenderness: L-3-L-5 Lower Extremities: Full ROM and Muscle Strength 5/5 Arises from Table Slowly Narrow Based Gait   Skin:    General: Skin is warm and dry.  Neurological:     Mental Status: He is alert and oriented to person, place, and time.  Psychiatric:        Mood and Affect: Mood normal.         Behavior: Behavior normal.           Assessment & Plan:  1. Lumbar postlaminectomy syndrome status post lumbar fusion x3: His most recent surgery was 03/14/2013. He had hardware removal  and bone allograft waist and L2-3. Continue to Monitor. Continue current Medication Regimen.03/09/2018 Refilled: oxyCODONE 7.5/325mg  one tablet every 4 hours as needed #100 and Oxycontin 30 mg one tablet every 12 hours #60.  We will continue the opioid monitoring program, this consists of regular clinic visits, examinations, urine drug screen, pill counts as well as use of [place] Controlled Substance Reporting System. 2. Lumbar Radiculopathy: Continuecurrent medication regimen withLyrica.03/09/2018 3. Insomnia: Continuecurrent medication regimen withTrazodone.03/09/2018 4. Nausea: Continue Zofran as needed.Oncology Following.02/072020 5. Cervical Spondylosis/ Cervicalgia/ Cervical Radiculitis: Continuecurrent medication regimen withLyrica. 03/09/2018. 6. Cervical Myofascial Pain Syndrome: Continue current treatment regimen. Continue to monitor.   20 minutes of face to face patient care time was spent during this visit. All questions were encouraged and answered.  F/U in 1 month

## 2018-03-13 LAB — DRUG TOX MONITOR 1 W/CONF, ORAL FLD
AMPHETAMINES: NEGATIVE ng/mL (ref <10)
BARBITURATES: NEGATIVE ng/mL (ref <10)
Benzodiazepines: NEGATIVE ng/mL (ref <0.50)
Buprenorphine: NEGATIVE ng/mL (ref <0.10)
COCAINE: NEGATIVE ng/mL (ref <5.0)
Codeine: NEGATIVE ng/mL (ref <2.5)
Cotinine: 30 ng/mL — ABNORMAL HIGH (ref <5.0)
Dihydrocodeine: NEGATIVE ng/mL (ref <2.5)
Fentanyl: NEGATIVE ng/mL (ref <0.10)
Heroin Metabolite: NEGATIVE ng/mL (ref <1.0)
Hydrocodone: NEGATIVE ng/mL (ref <2.5)
Hydromorphone: NEGATIVE ng/mL (ref <2.5)
MARIJUANA: NEGATIVE ng/mL (ref <2.5)
MDMA: NEGATIVE ng/mL (ref <10)
METHADONE: NEGATIVE ng/mL (ref <5.0)
Meprobamate: NEGATIVE ng/mL (ref <2.5)
Morphine: NEGATIVE ng/mL (ref <2.5)
Nicotine Metabolite: POSITIVE ng/mL — AB (ref <5.0)
Norhydrocodone: NEGATIVE ng/mL (ref <2.5)
Noroxycodone: 31.1 ng/mL — ABNORMAL HIGH (ref <2.5)
Opiates: POSITIVE ng/mL — AB (ref <2.5)
Oxycodone: 157.7 ng/mL — ABNORMAL HIGH (ref <2.5)
Oxymorphone: NEGATIVE ng/mL (ref <2.5)
Phencyclidine: NEGATIVE ng/mL (ref <10)
Tapentadol: NEGATIVE ng/mL (ref <5.0)
Tramadol: NEGATIVE ng/mL (ref <5.0)
Zolpidem: NEGATIVE ng/mL (ref <5.0)

## 2018-03-13 LAB — DRUG TOX ALC METAB W/CON, ORAL FLD: Alcohol Metabolite: NEGATIVE ng/mL (ref <25)

## 2018-03-14 ENCOUNTER — Telehealth: Payer: Self-pay | Admitting: *Deleted

## 2018-03-14 NOTE — Telephone Encounter (Signed)
Oral swab drug screen was consistent for prescribed medications.  ?

## 2018-03-19 ENCOUNTER — Other Ambulatory Visit: Payer: Self-pay

## 2018-03-19 ENCOUNTER — Inpatient Hospital Stay (HOSPITAL_BASED_OUTPATIENT_CLINIC_OR_DEPARTMENT_OTHER): Payer: Medicare Other | Admitting: Hematology & Oncology

## 2018-03-19 ENCOUNTER — Encounter: Payer: Self-pay | Admitting: Hematology & Oncology

## 2018-03-19 ENCOUNTER — Inpatient Hospital Stay: Payer: Medicare Other | Attending: Hematology & Oncology

## 2018-03-19 VITALS — BP 133/65 | HR 71 | Temp 97.6°F | Resp 16 | Wt 125.0 lb

## 2018-03-19 DIAGNOSIS — Z79899 Other long term (current) drug therapy: Secondary | ICD-10-CM | POA: Diagnosis not present

## 2018-03-19 DIAGNOSIS — Z923 Personal history of irradiation: Secondary | ICD-10-CM | POA: Insufficient documentation

## 2018-03-19 DIAGNOSIS — Z8589 Personal history of malignant neoplasm of other organs and systems: Secondary | ICD-10-CM | POA: Diagnosis not present

## 2018-03-19 DIAGNOSIS — C090 Malignant neoplasm of tonsillar fossa: Secondary | ICD-10-CM

## 2018-03-19 DIAGNOSIS — Z85818 Personal history of malignant neoplasm of other sites of lip, oral cavity, and pharynx: Secondary | ICD-10-CM | POA: Diagnosis not present

## 2018-03-19 DIAGNOSIS — Z9221 Personal history of antineoplastic chemotherapy: Secondary | ICD-10-CM | POA: Insufficient documentation

## 2018-03-19 DIAGNOSIS — F17210 Nicotine dependence, cigarettes, uncomplicated: Secondary | ICD-10-CM

## 2018-03-19 DIAGNOSIS — R1110 Vomiting, unspecified: Secondary | ICD-10-CM

## 2018-03-19 LAB — CMP (CANCER CENTER ONLY)
ALT: 43 U/L (ref 0–44)
AST: 27 U/L (ref 15–41)
Albumin: 4.3 g/dL (ref 3.5–5.0)
Alkaline Phosphatase: 51 U/L (ref 38–126)
Anion gap: 8 (ref 5–15)
BUN: 17 mg/dL (ref 8–23)
CO2: 29 mmol/L (ref 22–32)
Calcium: 9.3 mg/dL (ref 8.9–10.3)
Chloride: 98 mmol/L (ref 98–111)
Creatinine: 1.26 mg/dL — ABNORMAL HIGH (ref 0.61–1.24)
GFR, Est AFR Am: >60 mL/min (ref >60)
Glucose, Bld: 64 mg/dL — ABNORMAL LOW (ref 70–99)
Potassium: 4.7 mmol/L (ref 3.5–5.1)
Sodium: 135 mmol/L (ref 135–145)
Total Bilirubin: 0.3 mg/dL (ref 0.3–1.2)
Total Protein: 6.5 g/dL (ref 6.5–8.1)

## 2018-03-19 LAB — CBC WITH DIFFERENTIAL (CANCER CENTER ONLY)
Abs Immature Granulocytes: 0.02 10*3/uL (ref 0.00–0.07)
Basophils Absolute: 0 10*3/uL (ref 0.0–0.1)
Basophils Relative: 1 %
Eosinophils Absolute: 0.1 10*3/uL (ref 0.0–0.5)
Eosinophils Relative: 1 %
HCT: 36.1 % — ABNORMAL LOW (ref 39.0–52.0)
Hemoglobin: 12.6 g/dL — ABNORMAL LOW (ref 13.0–17.0)
Immature Granulocytes: 0 %
Lymphocytes Relative: 23 %
Lymphs Abs: 1.4 10*3/uL (ref 0.7–4.0)
MCH: 30.5 pg (ref 26.0–34.0)
MCHC: 34.9 g/dL (ref 30.0–36.0)
MCV: 87.4 fL (ref 80.0–100.0)
Monocytes Absolute: 0.9 10*3/uL (ref 0.1–1.0)
Monocytes Relative: 14 %
Neutro Abs: 3.8 10*3/uL (ref 1.7–7.7)
Neutrophils Relative %: 61 %
Platelet Count: 224 10*3/uL (ref 150–400)
RBC: 4.13 MIL/uL — ABNORMAL LOW (ref 4.22–5.81)
RDW: 13.3 % (ref 11.5–15.5)
WBC Count: 6.2 10*3/uL (ref 4.0–10.5)
nRBC: 0 % (ref 0.0–0.2)

## 2018-03-19 MED ORDER — OLANZAPINE 10 MG PO TABS: 10.0000 mg | ORAL_TABLET | Freq: Every day | ORAL | 3 refills | Status: DC

## 2018-03-19 MED ORDER — OXANDROLONE 2.5 MG PO TABS: 5.0000 mg | ORAL_TABLET | Freq: Two times a day (BID) | ORAL | 0 refills | Status: DC

## 2018-03-19 NOTE — Progress Notes (Signed)
Hematology and Oncology Follow Up Visit  ASH MCELWAIN [id number] 06/11/1955 63 y.o. 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 is here today for follow-up.  Unfortunately, he still having some issues.  He still having issues with vomiting.  I will now try him on Zyprexa.  I will try the Zyprexa at a dose of 5 mg p.o. twice daily.  I will try again the Oxandrin.  His insurance company would not let us try it.  Hopefully, I put in for a generic version of Oxandrin.  He is not hurting.  He is not having problems with cough.  He is still smoking.  He smokes about a pack a day.  Since stopping the Oxandrin, his weight is down a couple pounds.  He really liked the exam room because he felt that it made him eat better and gained weight.  The Marinol makes him a little bit dizzy with twice a day dosing.  I told him to take the Marinol once a day.  He has had no problems with fever.  He has had no bleeding.  He has had no diarrhea.  He may have little bit of constipation.  His pain is under fairly good control with oxycodone and OxyContin.  Overall, his performance status is ECOG 1.     Medications:  Allergies as of 03/19/2018   No Known Allergies     Medication List       Accurate as of March 19, 2018  2:49 PM. Always use your most recent med list.        docusate sodium 100 MG capsule Commonly known as:  COLACE Take 100 mg by mouth daily.   dronabinol 5 MG capsule Commonly known as:  MARINOL Take 1 capsule (5 mg total) by mouth 2 (two) times daily before lunch and supper.   metoCLOPramide 10 MG tablet Commonly known as:  REGLAN TAKE 1 TABLET BY MOUTH 4 TIMES DAILY BEFORE MEALS AND AT BEDTIME   multivitamin with minerals Tabs tablet Take 1 tablet by mouth  daily.   oxyCODONE 30 MG 12 hr tablet Commonly known as:  OXYCONTIN Take 1 tablet (30 mg total) by mouth 2 (two) times daily.   oxyCODONE-acetaminophen 7.5-325 MG tablet Commonly known as:  PERCOCET Take 1 tablet by mouth every 4 (four) hours as needed. May take one extra tablet when pain is sever. No more than 4 a day   pantoprazole 40 MG tablet Commonly known as:  PROTONIX Take 1 tablet (40 mg total) by mouth 2 (two) times daily before a meal.   pregabalin 100 MG capsule Commonly known as:  LYRICA TAKE 1 CAPSULE BY MOUTH THREE TIMES A DAY       Allergies: No Known Allergies  Past Medical History, Surgical history, Social history, and Family History were reviewed and updated.  Review of Systems: Review of Systems  Constitutional: Positive for malaise/fatigue.  HENT: Positive for sore throat.   Eyes: Negative.   Respiratory: Negative.   Cardiovascular: Negative.   Gastrointestinal: Negative.   Genitourinary: Positive for dysuria.  Musculoskeletal: Positive for joint pain and myalgias.  Skin: Negative.   Neurological: Positive for sensory change.  Endo/Heme/Allergies: Negative.   All other systems reviewed and are negative.   Physical Exam:  weight is 125 lb (  56.7 kg). His oral temperature is 97.6 F (36.4 C). His blood pressure is 133/65 and his pulse is 71. His respiration is 16 and oxygen saturation is 100%.   Wt Readings from Last 3 Encounters:  03/19/18 125 lb (56.7 kg)  03/09/18 124 lb (56.2 kg)  02/16/18 128 lb (58.1 kg)    Physical Exam Vitals signs reviewed.  HENT:     Head: Normocephalic and atraumatic.  Eyes:     Pupils: Pupils are equal, round, and reactive to light.  Neck:     Musculoskeletal: Normal range of motion.  Cardiovascular:     Rate and Rhythm: Normal rate and regular rhythm.     Heart sounds: Normal heart sounds.  Pulmonary:     Effort: Pulmonary effort is normal.     Breath sounds: Normal breath sounds.  Abdominal:     General:  Bowel sounds are normal.     Palpations: Abdomen is soft.  Musculoskeletal: Normal range of motion.        General: No tenderness or deformity.  Lymphadenopathy:     Cervical: No cervical adenopathy.  Skin:    General: Skin is warm and dry.     Findings: No erythema or rash.  Neurological:     Mental Status: He is alert and oriented to person, place, and time.  Psychiatric:        Behavior: Behavior normal.        Thought Content: Thought content normal.        Judgment: Judgment normal.      Lab Results  Component Value Date   WBC 6.2 03/19/2018   HGB 12.6 (L) 03/19/2018   HCT 36.1 (L) 03/19/2018   MCV 87.4 03/19/2018   PLT 224 03/19/2018   Lab Results  Component Value Date   FERRITIN 241 01/17/2018   IRON 63 01/17/2018   TIBC 401 01/17/2018   UIBC 338 01/17/2018   IRONPCTSAT 16 (L) 01/17/2018   Lab Results  Component Value Date   RBC 4.13 (L) 03/19/2018   No results found for: KPAFRELGTCHN, LAMBDASER, KAPLAMBRATIO No results found for: Kandis Cocking, IGMSERUM No results found for: Odetta Pink, SPEI   Chemistry      Component Value Date/Time   NA 135 03/19/2018 1303   NA 138 01/20/2017 1324   NA 135 (L) 04/05/2016 1333   K 4.7 03/19/2018 1303   K 4.5 01/20/2017 1324   K 4.7 04/05/2016 1333   CL 98 03/19/2018 1303   CL 97 (L) 01/20/2017 1324   CO2 29 03/19/2018 1303   CO2 30 01/20/2017 1324   CO2 29 04/05/2016 1333   BUN 17 03/19/2018 1303   BUN 15 01/20/2017 1324   BUN 23.6 04/05/2016 1333   CREATININE 1.26 (H) 03/19/2018 1303   CREATININE 1.2 01/20/2017 1324   CREATININE 1.1 04/05/2016 1333      Component Value Date/Time   CALCIUM 9.3 03/19/2018 1303   CALCIUM 9.2 01/20/2017 1324   CALCIUM 9.2 04/05/2016 1333   ALKPHOS 51 03/19/2018 1303   ALKPHOS 51 01/20/2017 1324   ALKPHOS 67 04/05/2016 1333   AST 27 03/19/2018 1303   AST 17 04/05/2016 1333   ALT 43 03/19/2018 1303   ALT 26 01/20/2017  1324   ALT 10 04/05/2016 1333   BILITOT 0.3 03/19/2018 1303   BILITOT 0.30 04/05/2016 1333      Impression and Plan: [name] is a very pleasant 63 yo caucasian gentleman with history  of squamous cell carcinoma of the left tonsil, HPV positive.  Hopefully, the Marinol will help him.  I do not see where his cancer has come back.  I do not see that we have to do any scans on him.  We will get him back now in 6 weeks.  I really want to get him back little bit more quickly so that we can see how his weight is doing.  Marland Kitchen  [name], MD 2/17/20202:49 PM

## 2018-03-29 ENCOUNTER — Other Ambulatory Visit: Payer: Self-pay | Admitting: Hematology & Oncology

## 2018-03-29 DIAGNOSIS — C090 Malignant neoplasm of tonsillar fossa: Secondary | ICD-10-CM

## 2018-03-29 DIAGNOSIS — K117 Disturbances of salivary secretion: Secondary | ICD-10-CM

## 2018-03-29 DIAGNOSIS — E034 Atrophy of thyroid (acquired): Secondary | ICD-10-CM

## 2018-04-20 ENCOUNTER — Encounter: Payer: Medicare Other | Admitting: Registered Nurse

## 2018-04-20 ENCOUNTER — Telehealth: Payer: Self-pay | Admitting: Registered Nurse

## 2018-04-20 MED ORDER — OXYCODONE-ACETAMINOPHEN 7.5-325 MG PO TABS: 1.0000 | ORAL_TABLET | ORAL | 0 refills | Status: DC | PRN

## 2018-04-20 MED ORDER — OXYCODONE HCL ER 30 MG PO T12A: 30.0000 mg | EXTENDED_RELEASE_TABLET | Freq: Two times a day (BID) | ORAL | 0 refills | Status: DC

## 2018-04-20 NOTE — Telephone Encounter (Signed)
Bruce Olson scheduled appointment changed due to the [facility] virus and his medical co-morbidities. Oxycontin and oxycodone e-scribed. Bruce Olson is aware of the above and verbalizes understanding.

## 2018-04-30 ENCOUNTER — Inpatient Hospital Stay (HOSPITAL_BASED_OUTPATIENT_CLINIC_OR_DEPARTMENT_OTHER): Payer: Medicare Other | Admitting: Hematology & Oncology

## 2018-04-30 ENCOUNTER — Other Ambulatory Visit: Payer: Self-pay

## 2018-04-30 ENCOUNTER — Encounter: Payer: Self-pay | Admitting: Hematology & Oncology

## 2018-04-30 ENCOUNTER — Inpatient Hospital Stay: Payer: Medicare Other | Attending: Hematology & Oncology

## 2018-04-30 VITALS — BP 164/83 | HR 67 | Temp 97.7°F | Resp 18 | Wt 121.0 lb

## 2018-04-30 DIAGNOSIS — R1110 Vomiting, unspecified: Secondary | ICD-10-CM

## 2018-04-30 DIAGNOSIS — F17210 Nicotine dependence, cigarettes, uncomplicated: Secondary | ICD-10-CM

## 2018-04-30 DIAGNOSIS — E034 Atrophy of thyroid (acquired): Secondary | ICD-10-CM | POA: Diagnosis not present

## 2018-04-30 DIAGNOSIS — Z923 Personal history of irradiation: Secondary | ICD-10-CM | POA: Diagnosis not present

## 2018-04-30 DIAGNOSIS — Z85818 Personal history of malignant neoplasm of other sites of lip, oral cavity, and pharynx: Secondary | ICD-10-CM | POA: Insufficient documentation

## 2018-04-30 DIAGNOSIS — Z9221 Personal history of antineoplastic chemotherapy: Secondary | ICD-10-CM | POA: Insufficient documentation

## 2018-04-30 DIAGNOSIS — C090 Malignant neoplasm of tonsillar fossa: Secondary | ICD-10-CM

## 2018-04-30 LAB — CBC WITH DIFFERENTIAL (CANCER CENTER ONLY)
Abs Immature Granulocytes: 0.01 10*3/uL (ref 0.00–0.07)
Basophils Absolute: 0 10*3/uL (ref 0.0–0.1)
Basophils Relative: 1 %
Eosinophils Absolute: 0.1 10*3/uL (ref 0.0–0.5)
Eosinophils Relative: 2 %
HCT: 38.8 % — ABNORMAL LOW (ref 39.0–52.0)
Hemoglobin: 13.1 g/dL (ref 13.0–17.0)
IMMATURE GRANULOCYTES: 0 %
LYMPHS PCT: 25 %
Lymphs Abs: 1.5 10*3/uL (ref 0.7–4.0)
MCH: 30 pg (ref 26.0–34.0)
MCHC: 33.8 g/dL (ref 30.0–36.0)
MCV: 88.8 fL (ref 80.0–100.0)
Monocytes Absolute: 0.8 10*3/uL (ref 0.1–1.0)
Monocytes Relative: 12 %
Neutro Abs: 3.7 10*3/uL (ref 1.7–7.7)
Neutrophils Relative %: 60 %
Platelet Count: 215 10*3/uL (ref 150–400)
RBC: 4.37 MIL/uL (ref 4.22–5.81)
RDW: 13.3 % (ref 11.5–15.5)
WBC Count: 6.1 10*3/uL (ref 4.0–10.5)
nRBC: 0 % (ref 0.0–0.2)

## 2018-04-30 LAB — CMP (CANCER CENTER ONLY)
ALT: 33 U/L (ref 0–44)
AST: 27 U/L (ref 15–41)
Albumin: 4.5 g/dL (ref 3.5–5.0)
Alkaline Phosphatase: 69 U/L (ref 38–126)
Anion gap: 7 (ref 5–15)
BUN: 16 mg/dL (ref 8–23)
CO2: 30 mmol/L (ref 22–32)
Calcium: 9.8 mg/dL (ref 8.9–10.3)
Chloride: 99 mmol/L (ref 98–111)
Creatinine: 1.17 mg/dL (ref 0.61–1.24)
GFR, Est AFR Am: >60 mL/min (ref >60)
GFR, Estimated: >60 mL/min (ref >60)
Glucose, Bld: 101 mg/dL — ABNORMAL HIGH (ref 70–99)
Potassium: 5 mmol/L (ref 3.5–5.1)
SODIUM: 136 mmol/L (ref 135–145)
Total Bilirubin: 0.4 mg/dL (ref 0.3–1.2)
Total Protein: 7.3 g/dL (ref 6.5–8.1)

## 2018-04-30 MED ORDER — TEMAZEPAM 15 MG PO CAPS: 15.0000 mg | ORAL_CAPSULE | Freq: Every evening | ORAL | 0 refills | Status: DC | PRN

## 2018-04-30 NOTE — Progress Notes (Signed)
Hematology and Oncology Follow Up Visit  Bruce Olson [id number] [birth year] 63 y.o. 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 is here today for follow-up.  Unfortunately, he still having some issues.  He is still having the issues with vomiting.  I just do not know what else to do about this.  I am not sure anything can be done about this.  His weight is down a little bit.  I think he is still eating decently.  Again, I do still think there is anything that can be done for the vomiting.  He has been seen by GI.  He has had endoscopies.  No medicine that would give him really helps with the vomiting.  He says he is not sleeping.  We tried him on doxepin.  He said this was not helping.  Mr. Sanger just has a lot of issues.  I do still think that he will ever get better fully.  We will try him on some Restoril (50 mg p.o. nightly) and see if this will help with the sleep.  His insurance will not pay for Oxandrin nor the Marinol.  This was helping him with his weight.  He seems to be doing pretty well with pain.  He has had no bleeding.  He has had no diarrhea.    He is still smoking a pack per day of cigarettes.  Overall, his performance status is ECOG 1.     Medications:  Allergies as of 04/30/2018   No Known Allergies     Medication List       Accurate as of April 30, 2018 11:18 AM. Always use your most recent med list.        docusate sodium 100 MG capsule Commonly known as:  COLACE Take 100 mg by mouth daily.   Doxepin HCl 6 MG Tabs Take 6 mg by mouth.   dronabinol 5 MG capsule Commonly known as:  MARINOL Take 1 capsule (5 mg total) by mouth 2 (two) times daily before lunch and supper.   metoCLOPramide 10 MG tablet Commonly known as:  REGLAN  Take 10 mg by mouth.   multivitamin with minerals Tabs tablet Take 1 tablet by mouth daily.   OLANZapine 10 MG tablet Commonly known as:  ZyPREXA Take 1 tablet (10 mg total) by mouth at bedtime.   oxandrolone 2.5 MG tablet Commonly known as:  OXANDRIN Take 2 tablets (5 mg total) by mouth 2 (two) times daily.   oxyCODONE 30 MG 12 hr tablet Commonly known as:  OxyCONTIN Take 1 tablet (30 mg total) by mouth 2 (two) times daily.   oxyCODONE-acetaminophen 7.5-325 MG tablet Commonly known as:  Percocet Take 1 tablet by mouth every 4 (four) hours as needed. May take one extra tablet when pain is sever. No more than 4 a day   pantoprazole 40 MG tablet Commonly known as:  Protonix Take 1 tablet (40 mg total) by mouth 2 (two) times daily before a meal.   pilocarpine 5 MG tablet Commonly known as:  SALAGEN TAKE 1 TABLET BY MOUTH 2 TIMES A DAY   pregabalin 100 MG capsule Commonly known as:  Lyrica TAKE 1 CAPSULE BY MOUTH THREE TIMES A DAY       Allergies: No Known Allergies  Past Medical  History, Surgical history, Social history, and Family History were reviewed and updated.  Review of Systems: Review of Systems  Constitutional: Positive for malaise/fatigue.  HENT: Positive for sore throat.   Eyes: Negative.   Respiratory: Negative.   Cardiovascular: Negative.   Gastrointestinal: Negative.   Genitourinary: Positive for dysuria.  Musculoskeletal: Positive for joint pain and myalgias.  Skin: Negative.   Neurological: Positive for sensory change.  Endo/Heme/Allergies: Negative.   All other systems reviewed and are negative.   Physical Exam:  weight is 121 lb (54.9 kg). His oral temperature is 97.7 F (36.5 C). His blood pressure is 164/83 (abnormal) and his pulse is 67. His respiration is 18 and oxygen saturation is 97%.   Wt Readings from Last 3 Encounters:  04/30/18 121 lb (54.9 kg)  03/19/18 125 lb (56.7 kg)  03/09/18 124 lb (56.2 kg)    Physical Exam Vitals  signs reviewed.  HENT:     Head: Normocephalic and atraumatic.  Eyes:     Pupils: Pupils are equal, round, and reactive to light.  Neck:     Musculoskeletal: Normal range of motion.  Cardiovascular:     Rate and Rhythm: Normal rate and regular rhythm.     Heart sounds: Normal heart sounds.  Pulmonary:     Effort: Pulmonary effort is normal.     Breath sounds: Normal breath sounds.  Abdominal:     General: Bowel sounds are normal.     Palpations: Abdomen is soft.  Musculoskeletal: Normal range of motion.        General: No tenderness or deformity.  Lymphadenopathy:     Cervical: No cervical adenopathy.  Skin:    General: Skin is warm and dry.     Findings: No erythema or rash.  Neurological:     Mental Status: He is alert and oriented to person, place, and time.  Psychiatric:        Behavior: Behavior normal.        Thought Content: Thought content normal.        Judgment: Judgment normal.      Lab Results  Component Value Date   WBC 6.1 04/30/2018   HGB 13.1 04/30/2018   HCT 38.8 (L) 04/30/2018   MCV 88.8 04/30/2018   PLT 215 04/30/2018   Lab Results  Component Value Date   FERRITIN 241 01/17/2018   IRON 63 01/17/2018   TIBC 401 01/17/2018   UIBC 338 01/17/2018   IRONPCTSAT 16 (L) 01/17/2018   Lab Results  Component Value Date   RBC 4.37 04/30/2018   No results found for: KPAFRELGTCHN, LAMBDASER, KAPLAMBRATIO No results found for: IGGSERUM, IGA, IGMSERUM No results found for: Odetta Pink, SPEI   Chemistry      Component Value Date/Time   NA 136 04/30/2018 1001   NA 138 01/20/2017 1324   NA 135 (L) 04/05/2016 1333   K 5.0 04/30/2018 1001   K 4.5 01/20/2017 1324   K 4.7 04/05/2016 1333   CL 99 04/30/2018 1001   CL 97 (L) 01/20/2017 1324   CO2 30 04/30/2018 1001   CO2 30 01/20/2017 1324   CO2 29 04/05/2016 1333   BUN 16 04/30/2018 1001   BUN 15 01/20/2017 1324   BUN 23.6 04/05/2016 1333    CREATININE 1.17 04/30/2018 1001   CREATININE 1.2 01/20/2017 1324   CREATININE 1.1 04/05/2016 1333      Component Value Date/Time   CALCIUM 9.8 04/30/2018 1001   CALCIUM  9.2 01/20/2017 1324   CALCIUM 9.2 04/05/2016 1333   ALKPHOS 69 04/30/2018 1001   ALKPHOS 51 01/20/2017 1324   ALKPHOS 67 04/05/2016 1333   AST 27 04/30/2018 1001   AST 17 04/05/2016 1333   ALT 33 04/30/2018 1001   ALT 26 01/20/2017 1324   ALT 10 04/05/2016 1333   BILITOT 0.4 04/30/2018 1001   BILITOT 0.30 04/05/2016 1333      Impression and Plan: Mr. Friedmann is a very pleasant 63 yo caucasian gentleman with history of squamous cell carcinoma of the left tonsil, HPV positive.  Again, I do think he has any problems with recurrent disease.  We will get him back in 6 weeks.  I told him to bring his medicines in with him so I can try to go over what he is taking and see what he might not need and see if that might help with his vomiting.   [name], MD 3/30/202011:18 AM

## 2018-05-08 ENCOUNTER — Telehealth: Payer: Self-pay | Admitting: Hematology & Oncology

## 2018-05-08 NOTE — Telephone Encounter (Signed)
Called and LMVM for patient regarding appointment for 5/11 being rescheduled due to provider on call. Letter/Calendar mailed

## 2018-05-22 ENCOUNTER — Other Ambulatory Visit: Payer: Self-pay

## 2018-05-22 ENCOUNTER — Encounter: Payer: Self-pay | Admitting: Registered Nurse

## 2018-05-22 ENCOUNTER — Encounter: Payer: Medicare Other | Attending: Physical Medicine and Rehabilitation | Admitting: Registered Nurse

## 2018-05-22 VITALS — Ht 67.0 in | Wt 120.0 lb

## 2018-05-22 DIAGNOSIS — G894 Chronic pain syndrome: Secondary | ICD-10-CM | POA: Diagnosis not present

## 2018-05-22 DIAGNOSIS — Z5181 Encounter for therapeutic drug level monitoring: Secondary | ICD-10-CM | POA: Diagnosis not present

## 2018-05-22 DIAGNOSIS — M961 Postlaminectomy syndrome, not elsewhere classified: Secondary | ICD-10-CM | POA: Diagnosis not present

## 2018-05-22 DIAGNOSIS — Z79899 Other long term (current) drug therapy: Secondary | ICD-10-CM | POA: Diagnosis not present

## 2018-05-22 DIAGNOSIS — M5416 Radiculopathy, lumbar region: Secondary | ICD-10-CM | POA: Insufficient documentation

## 2018-05-22 DIAGNOSIS — M542 Cervicalgia: Secondary | ICD-10-CM | POA: Diagnosis not present

## 2018-05-22 DIAGNOSIS — M5412 Radiculopathy, cervical region: Secondary | ICD-10-CM

## 2018-05-22 DIAGNOSIS — Z9889 Other specified postprocedural states: Secondary | ICD-10-CM | POA: Insufficient documentation

## 2018-05-22 DIAGNOSIS — M7918 Myalgia, other site: Secondary | ICD-10-CM

## 2018-05-22 MED ORDER — OXYCODONE-ACETAMINOPHEN 7.5-325 MG PO TABS: 1.0000 | ORAL_TABLET | ORAL | 0 refills | Status: DC | PRN

## 2018-05-22 MED ORDER — OXYCODONE HCL ER 30 MG PO T12A: 30.0000 mg | EXTENDED_RELEASE_TABLET | Freq: Two times a day (BID) | ORAL | 0 refills | Status: DC

## 2018-05-22 NOTE — Progress Notes (Signed)
Subjective:    Patient ID: Bruce Olson, male    DOB: Feb 13, 1955, 63 y.o.   MRN: [id number]  HPI: Bruce Olson is a 63 y.o. male his appointment was changed, due to national recommendations of social distancing due to [place] 19, an audio/video telehealth visit is felt to be most appropriate for this patient at this time.  See Chart message from today for the patient's consent to telehealth from Bruce Olson.   He states his pain is located in his neck radiating into his right shoulder, mid- lower back pain radiating into his right lower extremity. He rates his pain 7. His current exercise regime is walking and performing stretching exercises.  Mr. Certain Morphine equivalent is 135.00 MME. Last Oral Swab was Performed on 03/09/2018, it was consistent.    Bruce Olson CMA asked the Health and History Questions. This provider and Bruce Brightverified we were  speaking with the correct person using two identifiers.  Pain Inventory Average Pain 7 Pain Right Now 7 My pain is constant, dull and aching  In the last 24 hours, has pain interfered with the following? General activity 6 Relation with others 6 Enjoyment of life 6 What TIME of day is your pain at its worst? morning Sleep (in general) Poor  Pain is worse with: walking, sitting and some activites Pain improves with: medication Relief from Meds: 3  Mobility use a cane ability to climb steps?  yes do you drive?  yes  Function disabled: date disabled 2002  Neuro/Psych weakness trouble walking  Prior Studies Any changes since last visit?  no  Physicians involved in your care Any changes since last visit?  no   Family History  Problem Relation Age of Onset  . Anesthesia problems Neg Hx   . Hypotension Neg Hx   . Malignant hyperthermia Neg Hx   . Pseudochol deficiency Neg Hx    Social History   Socioeconomic History  . Marital status: Single    Spouse name: Not on  file  . Number of children: Not on file  . Years of education: Not on file  . Highest education level: Not on file  Occupational History  . Not on file  Social Needs  . Financial resource strain: Not on file  . Food insecurity:    Worry: Not on file    Inability: Not on file  . Transportation needs:    Medical: Not on file    Non-medical: Not on file  Tobacco Use  . Smoking status: Current Every Day Smoker    Packs/day: 1.00    Years: 33.00    Pack years: 33.00    Types: Cigarettes  . Smokeless tobacco: Never Used  Substance and Sexual Activity  . Alcohol use: No  . Drug use: No  . Sexual activity: Never    Comment: back problems  Lifestyle  . Physical activity:    Days per week: Not on file    Minutes per session: Not on file  . Stress: Not on file  Relationships  . Social connections:    Talks on phone: Not on file    Gets together: Not on file    Attends religious service: Not on file    Active member of club or organization: Not on file    Attends meetings of clubs or organizations: Not on file    Relationship status: Not on file  Other Topics Concern  . Not on file  Social History  Narrative  . Not on file   Past Surgical History:  Procedure Laterality Date  . BACK SURGERY  2013   Back surgery  (fusion)09/15/2010 & 2013  . BACK SURGERY  03-2013  . HARDWARE REMOVAL N/A 03/14/2013   Procedure: HARDWARE REMOVAL;  Surgeon: Bruce Moore, MD;  Location: [facility];  Service: Neurosurgery;  Laterality: N/A;   Past Medical History:  Diagnosis Date  . Goals of care, counseling/discussion 04/07/2016  . Iron deficiency anemia due to chronic blood loss 10/21/2016  . 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 No pertinent past medical history   . Occupational injury 2013   resulted in back injury that led to surgery  . Thyroid cancer ([place])    There were no vitals taken for this visit.  Opioid Risk Score:   Fall Risk Score:  `1  Depression screen PHQ 2/9  Depression  screen [facility] 2/9 02/16/2018 12/22/2017 10/24/2017 06/22/2017 04/20/2017 03/20/2017 11/21/2016  Decreased Interest 0 0 0 0 0 0 0  Down, Depressed, Hopeless 0 0 0 0 0 0 0  PHQ - 2 Score 0 0 0 0 0 0 0  Altered sleeping - - - - - - -  Tired, decreased energy - - - - - - -  Change in appetite - - - - - - -  Feeling bad or failure about yourself  - - - - - - -  Trouble concentrating - - - - - - -  Moving slowly or fidgety/restless - - - - - - -  Suicidal thoughts - - - - - - -  PHQ-9 Score - - - - - - -     Review of Systems  Constitutional: Negative.   HENT: Positive for sinus pressure.   Eyes: Negative.   Respiratory: Negative.   Cardiovascular: Negative.   Gastrointestinal: Negative.   Endocrine: Negative.   Genitourinary: Negative.   Musculoskeletal: Positive for arthralgias, back pain and myalgias.  Skin: Negative.   Allergic/Immunologic: Positive for environmental allergies.  Neurological: Negative.   Hematological: Negative.   Psychiatric/Behavioral: Negative.   All other systems reviewed and are negative.      Objective:   Physical Exam Vitals signs and nursing note reviewed.  Musculoskeletal:     Comments: No Physical Exam: Virtual Visit  Neurological:     Mental Status: He is oriented to person, place, and time.           Assessment & Plan:  1. Lumbar postlaminectomy syndrome status post lumbar fusion x3: His most recent surgery was 03/14/2013. He had hardware removal and bone allograft waist and L2-3. Continue to Monitor. Continue current Medication Regimen.05/22/2018 Refilled: oxyCODONE 7.5/325mg  one tablet every 4 hours as needed #100 and Oxycontin 30 mg one tablet every 12 hours #60.  We will continue the opioid monitoring program, this consists of regular clinic visits, examinations, urine drug screen, pill counts as well as use of [place] Controlled Substance Reporting System. 2. Lumbar Radiculopathy: Continuecurrent medication regimen  withLyrica.05/22/2018 3. Insomnia: Continuecurrent medication regimen withTrazodone.05/22/2018 4. Nausea: Continue Zofran as needed.Oncology Following.[id number] 5. Cervical Spondylosis/ Cervicalgia/ Cervical Radiculitis: Continuecurrent medication regimen withLyrica. 05/22/2018. 6. Cervical Myofascial Pain Syndrome: Continue current treatment regimen. Continue to monitor.05/22/2018  F/U in 1 month  Telephone Call  Location of patient: In his Home Location of provider: Office Established patient Time spent on call: 15 minutes

## 2018-06-05 ENCOUNTER — Other Ambulatory Visit: Payer: Self-pay | Admitting: *Deleted

## 2018-06-05 MED ORDER — METOCLOPRAMIDE HCL 10 MG PO TABS: 10.0000 mg | ORAL_TABLET | Freq: Four times a day (QID) | ORAL | 6 refills | Status: DC

## 2018-06-11 ENCOUNTER — Other Ambulatory Visit: Payer: Self-pay

## 2018-06-11 ENCOUNTER — Ambulatory Visit: Payer: Self-pay | Admitting: Hematology & Oncology

## 2018-06-21 ENCOUNTER — Encounter: Payer: Medicare Other | Attending: Physical Medicine and Rehabilitation | Admitting: Registered Nurse

## 2018-06-21 ENCOUNTER — Other Ambulatory Visit: Payer: Self-pay

## 2018-06-21 ENCOUNTER — Encounter: Payer: Self-pay | Admitting: Registered Nurse

## 2018-06-21 VITALS — BP 127/75 | HR 75 | Ht 66.0 in | Wt 132.0 lb

## 2018-06-21 DIAGNOSIS — F17210 Nicotine dependence, cigarettes, uncomplicated: Secondary | ICD-10-CM

## 2018-06-21 DIAGNOSIS — Z5181 Encounter for therapeutic drug level monitoring: Secondary | ICD-10-CM | POA: Diagnosis not present

## 2018-06-21 DIAGNOSIS — M7918 Myalgia, other site: Secondary | ICD-10-CM | POA: Diagnosis not present

## 2018-06-21 DIAGNOSIS — M961 Postlaminectomy syndrome, not elsewhere classified: Secondary | ICD-10-CM

## 2018-06-21 DIAGNOSIS — Z9889 Other specified postprocedural states: Secondary | ICD-10-CM

## 2018-06-21 DIAGNOSIS — G894 Chronic pain syndrome: Secondary | ICD-10-CM | POA: Diagnosis not present

## 2018-06-21 DIAGNOSIS — M542 Cervicalgia: Secondary | ICD-10-CM | POA: Diagnosis not present

## 2018-06-21 DIAGNOSIS — Z79899 Other long term (current) drug therapy: Secondary | ICD-10-CM

## 2018-06-21 DIAGNOSIS — M5412 Radiculopathy, cervical region: Secondary | ICD-10-CM

## 2018-06-21 DIAGNOSIS — M5416 Radiculopathy, lumbar region: Secondary | ICD-10-CM | POA: Insufficient documentation

## 2018-06-21 MED ORDER — OXYCODONE HCL ER 30 MG PO T12A: 30.0000 mg | EXTENDED_RELEASE_TABLET | Freq: Two times a day (BID) | ORAL | 0 refills | Status: DC

## 2018-06-21 MED ORDER — OXYCODONE-ACETAMINOPHEN 7.5-325 MG PO TABS: 1.0000 | ORAL_TABLET | ORAL | 0 refills | Status: DC | PRN

## 2018-06-21 NOTE — Progress Notes (Signed)
Subjective:    Patient ID: Bruce Olson, male    DOB: 1955-12-20, 63 y.o.   MRN: [id number]  HPI: Bruce Olson is a 63 y.o. male who returns for follow up appointment for chronic pain and medication refill. He states his pain is located in his neck radiating into his right shoulder and mid- lower back pain. He rates his  Pain 7. His current exercise regime is walking and performing stretching exercises.  Mr. Mccard Morphine equivalent is 135.00 MME.  Last Oral Swab was Performed on 03/09/2018, it was consistent.    Pain Inventory Average Pain 7 Pain Right Now 7 My pain is sharp, stabbing and tingling  In the last 24 hours, has pain interfered with the following? General activity 7 Relation with others 7 Enjoyment of life 7 What TIME of day is your pain at its worst? all Sleep (in general) Poor  Pain is worse with: walking, sitting and standing Pain improves with: heat/ice, medication and TENS Relief from Meds: 3  Mobility walk without assistance use a cane ability to climb steps?  yes do you drive?  yes  Function disabled: date disabled .  Neuro/Psych weakness numbness tremor trouble walking  Prior Studies Any changes since last visit?  no  Physicians involved in your care Any changes since last visit?  no   Family History  Problem Relation Age of Onset  . Anesthesia problems Neg Hx   . Hypotension Neg Hx   . Malignant hyperthermia Neg Hx   . Pseudochol deficiency Neg Hx    Social History   Socioeconomic History  . Marital status: Single    Spouse name: Not on file  . Number of children: Not on file  . Years of education: Not on file  . Highest education level: Not on file  Occupational History  . Not on file  Social Needs  . Financial resource strain: Not on file  . Food insecurity:    Worry: Not on file    Inability: Not on file  . Transportation needs:    Medical: Not on file    Non-medical: Not on file  Tobacco Use  .  Smoking status: Current Every Day Smoker    Packs/day: 1.00    Years: 33.00    Pack years: 33.00    Types: Cigarettes  . Smokeless tobacco: Never Used  Substance and Sexual Activity  . Alcohol use: No  . Drug use: No  . Sexual activity: Never    Comment: back problems  Lifestyle  . Physical activity:    Days per week: Not on file    Minutes per session: Not on file  . Stress: Not on file  Relationships  . Social connections:    Talks on phone: Not on file    Gets together: Not on file    Attends religious service: Not on file    Active member of club or organization: Not on file    Attends meetings of clubs or organizations: Not on file    Relationship status: Not on file  Other Topics Concern  . Not on file  Social History Narrative  . Not on file   Past Surgical History:  Procedure Laterality Date  . BACK SURGERY  2013   Back surgery  (fusion)09/15/2010 & 2013  . BACK SURGERY  03-2013  . HARDWARE REMOVAL N/A 03/14/2013   Procedure: HARDWARE REMOVAL;  Surgeon: Eustace Moore, MD;  Location: [place] [facility];  Service: Neurosurgery;  Laterality:  N/A;   Past Medical History:  Diagnosis Date  . Goals of care, counseling/discussion 04/07/2016  . Iron deficiency anemia due to chronic blood loss 10/21/2016  . 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 No pertinent past medical history   . Occupational injury 2013   resulted in back injury that led to surgery  . Thyroid cancer (H[facility])    BP (!) 168/75   Pulse 75   Ht 5\' 6"  (1.676 m)   Wt 132 lb (59.9 kg)   SpO2 94%   BMI 21.31 kg/m   Opioid Risk Score:   Fall Risk Score:  `1  Depression screen PHQ 2/9  Depression screen [facility] 2/9 02/16/2018 12/22/2017 10/24/2017 06/22/2017 04/20/2017 03/20/2017 11/21/2016  Decreased Interest 0 0 0 0 0 0 0  Down, Depressed, Hopeless 0 0 0 0 0 0 0  PHQ - 2 Score 0 0 0 0 0 0 0  Altered sleeping - - - - - - -  Tired, decreased energy - - - - - - -  Change in appetite - - - - - - -  Feeling bad or failure  about yourself  - - - - - - -  Trouble concentrating - - - - - - -  Moving slowly or fidgety/restless - - - - - - -  Suicidal thoughts - - - - - - -  PHQ-9 Score - - - - - - -    Review of Systems  Constitutional: Negative.   HENT: Negative.   Eyes: Negative.   Respiratory: Negative.   Cardiovascular: Negative.   Gastrointestinal: Negative.   Endocrine: Negative.   Genitourinary: Negative.   Musculoskeletal: Positive for back pain, gait problem and neck pain.  Skin: Negative.   Allergic/Immunologic: Negative.   Neurological: Positive for tremors, weakness and numbness.  Hematological: Negative.   Psychiatric/Behavioral: Negative.   All other systems reviewed and are negative.      Objective:   Physical Exam Vitals signs and nursing note reviewed.  Constitutional:      Appearance: Normal appearance.  Neck:     Comments: Cervical Paraspinal Tenderness: C-5-C-6 Mainly Right Side Cardiovascular:     Rate and Rhythm: Normal rate and regular rhythm.     Pulses: Normal pulses.     Heart sounds: Normal heart sounds.  Pulmonary:     Effort: Pulmonary effort is normal.     Breath sounds: Normal breath sounds.  Musculoskeletal:     Comments: Normal Muscle Bulk and Muscle Testing Reveals:  Upper Extremities: Full ROM and Muscle Strength 5/5 Right AC Joint Tenderness  Thoracic Paraspinal Tenderness: T-1-T-3 Mainly Right Side  Lumbar Hypersensitivity Lower Extremities: Full ROM and Muscle Strength 5/5 Arises from Table with ease Narrow Based  Gait   Skin:    General: Skin is warm and dry.  Neurological:     Mental Status: He is alert and oriented to person, place, and time.           Assessment & Plan:  1. Lumbar postlaminectomy syndrome status post lumbar fusion x3: His most recent surgery was 03/14/2013. He had hardware removal and bone allograft waist and L2-3. Continue to Monitor. Continue current Medication Regimen.06/21/2018 Refilled: oxyCODONE 7.5/325mg  one  tablet every 4 hours as needed #100 and Oxycontin 30 mg one tablet every 12 hours #60.  We will continue the opioid monitoring program, this consists of regular clinic visits, examinations, urine drug screen, pill counts as well as use of [place] Controlled Substance Reporting System. 2.  Lumbar Radiculopathy: Continuecurrent medication regimen withLyrica.06/21/2018 3. Insomnia: Continuecurrent medication regimen withTrazodone.06/21/2018 4. Nausea: Continue Zofran as needed.Oncology Following.04/[id number] 5. Cervical Spondylosis/ Cervicalgia/ Cervical Radiculitis: Continuecurrent medication regimen withLyrica. 06/21/2018. 6. Cervical Myofascial Pain Syndrome: Continue current treatment regimen. Continue to monitor.06/21/2018  F/U in 1 month

## 2018-07-06 ENCOUNTER — Telehealth: Payer: Self-pay | Admitting: Hematology & Oncology

## 2018-07-06 ENCOUNTER — Inpatient Hospital Stay (HOSPITAL_BASED_OUTPATIENT_CLINIC_OR_DEPARTMENT_OTHER): Payer: Medicare Other | Admitting: Hematology & Oncology

## 2018-07-06 ENCOUNTER — Inpatient Hospital Stay: Payer: Medicare Other | Attending: Hematology & Oncology

## 2018-07-06 ENCOUNTER — Encounter: Payer: Self-pay | Admitting: Hematology & Oncology

## 2018-07-06 ENCOUNTER — Other Ambulatory Visit: Payer: Self-pay

## 2018-07-06 VITALS — BP 157/61 | HR 65 | Temp 97.0°F | Resp 18 | Wt 121.0 lb

## 2018-07-06 DIAGNOSIS — F17210 Nicotine dependence, cigarettes, uncomplicated: Secondary | ICD-10-CM | POA: Insufficient documentation

## 2018-07-06 DIAGNOSIS — Z923 Personal history of irradiation: Secondary | ICD-10-CM | POA: Insufficient documentation

## 2018-07-06 DIAGNOSIS — R300 Dysuria: Secondary | ICD-10-CM | POA: Insufficient documentation

## 2018-07-06 DIAGNOSIS — C090 Malignant neoplasm of tonsillar fossa: Secondary | ICD-10-CM | POA: Insufficient documentation

## 2018-07-06 DIAGNOSIS — A630 Anogenital (venereal) warts: Secondary | ICD-10-CM | POA: Diagnosis not present

## 2018-07-06 DIAGNOSIS — R208 Other disturbances of skin sensation: Secondary | ICD-10-CM | POA: Diagnosis not present

## 2018-07-06 DIAGNOSIS — J029 Acute pharyngitis, unspecified: Secondary | ICD-10-CM

## 2018-07-06 DIAGNOSIS — M791 Myalgia, unspecified site: Secondary | ICD-10-CM | POA: Insufficient documentation

## 2018-07-06 DIAGNOSIS — R5383 Other fatigue: Secondary | ICD-10-CM | POA: Insufficient documentation

## 2018-07-06 DIAGNOSIS — R1110 Vomiting, unspecified: Secondary | ICD-10-CM | POA: Insufficient documentation

## 2018-07-06 DIAGNOSIS — M2550 Pain in unspecified joint: Secondary | ICD-10-CM

## 2018-07-06 DIAGNOSIS — Z9221 Personal history of antineoplastic chemotherapy: Secondary | ICD-10-CM | POA: Insufficient documentation

## 2018-07-06 DIAGNOSIS — E034 Atrophy of thyroid (acquired): Secondary | ICD-10-CM

## 2018-07-06 LAB — CBC WITH DIFFERENTIAL (CANCER CENTER ONLY)
Abs Immature Granulocytes: 0.02 10*3/uL (ref 0.00–0.07)
Basophils Absolute: 0 10*3/uL (ref 0.0–0.1)
Basophils Relative: 0 %
Eosinophils Absolute: 0.1 10*3/uL (ref 0.0–0.5)
Eosinophils Relative: 2 %
HCT: 37.1 % — ABNORMAL LOW (ref 39.0–52.0)
Hemoglobin: 12.2 g/dL — ABNORMAL LOW (ref 13.0–17.0)
Immature Granulocytes: 0 %
Lymphocytes Relative: 20 %
Lymphs Abs: 1.5 10*3/uL (ref 0.7–4.0)
MCH: 30.1 pg (ref 26.0–34.0)
MCHC: 32.9 g/dL (ref 30.0–36.0)
MCV: 91.6 fL (ref 80.0–100.0)
Monocytes Absolute: 1 10*3/uL (ref 0.1–1.0)
Monocytes Relative: 13 %
Neutro Abs: 5.1 10*3/uL (ref 1.7–7.7)
Neutrophils Relative %: 65 %
Platelet Count: 235 10*3/uL (ref 150–400)
RBC: 4.05 MIL/uL — ABNORMAL LOW (ref 4.22–5.81)
RDW: 13 % (ref 11.5–15.5)
WBC Count: 7.8 10*3/uL (ref 4.0–10.5)
nRBC: 0 % (ref 0.0–0.2)

## 2018-07-06 LAB — CMP (CANCER CENTER ONLY)
ALT: 22 U/L (ref 0–44)
AST: 21 U/L (ref 15–41)
Albumin: 4.2 g/dL (ref 3.5–5.0)
Alkaline Phosphatase: 66 U/L (ref 38–126)
Anion gap: 7 (ref 5–15)
BUN: 16 mg/dL (ref 8–23)
CO2: 28 mmol/L (ref 22–32)
Calcium: 8.6 mg/dL — ABNORMAL LOW (ref 8.9–10.3)
Chloride: 102 mmol/L (ref 98–111)
Creatinine: 1.12 mg/dL (ref 0.61–1.24)
GFR, Est AFR Am: >60 mL/min (ref >60)
GFR, Estimated: >60 mL/min (ref >60)
Glucose, Bld: 87 mg/dL (ref 70–99)
Potassium: 4.4 mmol/L (ref 3.5–5.1)
Sodium: 137 mmol/L (ref 135–145)
Total Bilirubin: 0.3 mg/dL (ref 0.3–1.2)
Total Protein: 6.8 g/dL (ref 6.5–8.1)

## 2018-07-06 LAB — IRON AND TIBC
Iron: 34 ug/dL — ABNORMAL LOW (ref 42–163)
Saturation Ratios: 12 % — ABNORMAL LOW (ref 20–55)
TIBC: 281 ug/dL (ref 202–409)
UIBC: 247 ug/dL (ref 117–376)

## 2018-07-06 LAB — FERRITIN: Ferritin: 200 ng/mL (ref 24–336)

## 2018-07-06 LAB — TSH: TSH: 2.358 u[IU]/mL (ref 0.320–4.118)

## 2018-07-06 NOTE — Telephone Encounter (Signed)
lvm for 9/4 appt at 0930 per 6/5 los

## 2018-07-06 NOTE — Progress Notes (Signed)
Hematology and Oncology Follow Up Visit  Bruce Olson [id number] 1955-06-27 63 y.o. 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 is here today for follow-up.  Unfortunately, he still having some issues.  He is still having the issues with vomiting.  I just do not know what else to do about this.  I am not sure anything can be done about this.  His weight is holding steady, at least.    I think he is still eating decently.  He still is having trouble sleeping.  Again, we have tried everything for this.  I do not know of anything else that we can do try to help with the sleeping.  Maybe his family doctor could help out with this.  He is nice and tan.  He does work outside.  He does like doing what he can do outside.  He is still smoking a pack per day of cigarettes.  Overall, his performance status is ECOG 1.     Medications:  Allergies as of 07/06/2018   No Known Allergies     Medication List       Accurate as of July 06, 2018 12:23 PM. If you have any questions, ask your nurse or doctor.        docusate sodium 100 MG capsule Commonly known as:  COLACE Take 100 mg by mouth daily.   metoCLOPramide 10 MG tablet Commonly known as:  REGLAN Take 1 tablet (10 mg total) by mouth 4 (four) times daily.   multivitamin with minerals Tabs tablet Take 1 tablet by mouth daily.   OLANZapine 10 MG tablet Commonly known as:  ZyPREXA Take 1 tablet (10 mg total) by mouth at bedtime.   oxyCODONE 30 MG 12 hr tablet Commonly known as:  OxyCONTIN Take 1 tablet (30 mg total) by mouth 2 (two) times daily.   oxyCODONE-acetaminophen 7.5-325 MG tablet Commonly known as:  Percocet Take 1 tablet by mouth every 4 (four) hours as needed. May take one extra tablet when pain is sever. No  more than 4 a day   pantoprazole 40 MG tablet Commonly known as:  Protonix Take 1 tablet (40 mg total) by mouth 2 (two) times daily before a meal.   pilocarpine 5 MG tablet Commonly known as:  SALAGEN TAKE 1 TABLET BY MOUTH 2 TIMES A DAY   pregabalin 100 MG capsule Commonly known as:  Lyrica TAKE 1 CAPSULE BY MOUTH THREE TIMES A DAY   temazepam 15 MG capsule Commonly known as:  RESTORIL Take 1 capsule (15 mg total) by mouth at bedtime as needed for sleep.       Allergies: No Known Allergies  Past Medical History, Surgical history, Social history, and Family History were reviewed and updated.  Review of Systems: Review of Systems  Constitutional: Positive for malaise/fatigue.  HENT: Positive for sore throat.   Eyes: Negative.   Respiratory: Negative.   Cardiovascular: Negative.   Gastrointestinal: Negative.   Genitourinary: Positive for dysuria.  Musculoskeletal: Positive for joint pain and myalgias.  Skin: Negative.   Neurological: Positive for sensory change.  Endo/Heme/Allergies: Negative.   All other systems reviewed and are negative.   Physical Exam:  weight is 121 lb (54.9 kg). His oral temperature is 97 F (36.1 C) (abnormal). His blood pressure is  157/61 (abnormal) and his pulse is 65. His respiration is 18 and oxygen saturation is 96%.   Wt Readings from Last 3 Encounters:  07/06/18 121 lb (54.9 kg)  06/21/18 132 lb (59.9 kg)  05/22/18 120 lb (54.4 kg)    Physical Exam Vitals signs reviewed.  HENT:     Head: Normocephalic and atraumatic.  Eyes:     Pupils: Pupils are equal, round, and reactive to light.  Neck:     Musculoskeletal: Normal range of motion.  Cardiovascular:     Rate and Rhythm: Normal rate and regular rhythm.     Heart sounds: Normal heart sounds.  Pulmonary:     Effort: Pulmonary effort is normal.     Breath sounds: Normal breath sounds.  Abdominal:     General: Bowel sounds are normal.     Palpations: Abdomen is soft.   Musculoskeletal: Normal range of motion.        General: No tenderness or deformity.  Lymphadenopathy:     Cervical: No cervical adenopathy.  Skin:    General: Skin is warm and dry.     Findings: No erythema or rash.  Neurological:     Mental Status: He is alert and oriented to person, place, and time.  Psychiatric:        Behavior: Behavior normal.        Thought Content: Thought content normal.        Judgment: Judgment normal.      Lab Results  Component Value Date   WBC 7.8 07/06/2018   HGB 12.2 (L) 07/06/2018   HCT 37.1 (L) 07/06/2018   MCV 91.6 07/06/2018   PLT 235 07/06/2018   Lab Results  Component Value Date   FERRITIN 241 01/17/2018   IRON 63 01/17/2018   TIBC 401 01/17/2018   UIBC 338 01/17/2018   IRONPCTSAT 16 (L) 01/17/2018   Lab Results  Component Value Date   RBC 4.05 (L) 07/06/2018   No results found for: KPAFRELGTCHN, LAMBDASER, KAPLAMBRATIO No results found for: IGGSERUM, IGA, IGMSERUM No results found for: Odetta Pink, SPEI   Chemistry      Component Value Date/Time   NA 137 07/06/2018 0958   NA 138 01/20/2017 1324   NA 135 (L) 04/05/2016 1333   K 4.4 07/06/2018 0958   K 4.5 01/20/2017 1324   K 4.7 04/05/2016 1333   CL 102 07/06/2018 0958   CL 97 (L) 01/20/2017 1324   CO2 28 07/06/2018 0958   CO2 30 01/20/2017 1324   CO2 29 04/05/2016 1333   BUN 16 07/06/2018 0958   BUN 15 01/20/2017 1324   BUN 23.6 04/05/2016 1333   CREATININE 1.12 07/06/2018 0958   CREATININE 1.2 01/20/2017 1324   CREATININE 1.1 04/05/2016 1333      Component Value Date/Time   CALCIUM 8.6 (L) 07/06/2018 0958   CALCIUM 9.2 01/20/2017 1324   CALCIUM 9.2 04/05/2016 1333   ALKPHOS 66 07/06/2018 0958   ALKPHOS 51 01/20/2017 1324   ALKPHOS 67 04/05/2016 1333   AST 21 07/06/2018 0958   AST 17 04/05/2016 1333   ALT 22 07/06/2018 0958   ALT 26 01/20/2017 1324   ALT 10 04/05/2016 1333   BILITOT 0.3 07/06/2018  0958   BILITOT 0.30 04/05/2016 1333      Impression and Plan: Bruce Olson is a very pleasant 63 yo caucasian gentleman with history of squamous cell carcinoma of the left tonsil, HPV positive.  Again, I  do think he has any problems with recurrent disease.  We will get him back after Labor Day now.  I really think we can get him through the summer time.  I just do not see any problem with respect to his cancer being an issue.  Hopefully, he will bring his medicines and when we see him again so that we can figure out what he is actually taking and what he does not need.  For some reason, I doubt that he will do this.     [name], MD 6/5/202012:23 PM

## 2018-07-23 ENCOUNTER — Encounter: Payer: Medicare Other | Admitting: Registered Nurse

## 2018-07-24 ENCOUNTER — Encounter: Payer: Self-pay | Admitting: Registered Nurse

## 2018-07-24 ENCOUNTER — Encounter: Payer: Medicare Other | Attending: Physical Medicine and Rehabilitation | Admitting: Registered Nurse

## 2018-07-24 ENCOUNTER — Other Ambulatory Visit: Payer: Self-pay

## 2018-07-24 VITALS — BP 128/69 | HR 74 | Temp 97.7°F | Resp 16 | Ht 66.0 in | Wt 120.0 lb

## 2018-07-24 DIAGNOSIS — Z9889 Other specified postprocedural states: Secondary | ICD-10-CM | POA: Diagnosis not present

## 2018-07-24 DIAGNOSIS — M7918 Myalgia, other site: Secondary | ICD-10-CM

## 2018-07-24 DIAGNOSIS — M542 Cervicalgia: Secondary | ICD-10-CM

## 2018-07-24 DIAGNOSIS — M5416 Radiculopathy, lumbar region: Secondary | ICD-10-CM | POA: Diagnosis not present

## 2018-07-24 DIAGNOSIS — G894 Chronic pain syndrome: Secondary | ICD-10-CM | POA: Diagnosis not present

## 2018-07-24 DIAGNOSIS — Z79899 Other long term (current) drug therapy: Secondary | ICD-10-CM | POA: Diagnosis not present

## 2018-07-24 DIAGNOSIS — M5412 Radiculopathy, cervical region: Secondary | ICD-10-CM

## 2018-07-24 DIAGNOSIS — M961 Postlaminectomy syndrome, not elsewhere classified: Secondary | ICD-10-CM

## 2018-07-24 DIAGNOSIS — Z5181 Encounter for therapeutic drug level monitoring: Secondary | ICD-10-CM | POA: Diagnosis not present

## 2018-07-24 MED ORDER — OXYCODONE HCL ER 30 MG PO T12A: 30.0000 mg | EXTENDED_RELEASE_TABLET | Freq: Two times a day (BID) | ORAL | 0 refills | Status: DC

## 2018-07-24 MED ORDER — OXYCODONE-ACETAMINOPHEN 7.5-325 MG PO TABS: 1.0000 | ORAL_TABLET | ORAL | 0 refills | Status: DC | PRN

## 2018-07-24 MED ORDER — PREGABALIN 100 MG PO CAPS: ORAL_CAPSULE | ORAL | 5 refills | Status: DC

## 2018-07-24 NOTE — Progress Notes (Signed)
Subjective:    Patient ID: ENGLISH Bruce Olson, male    DOB: 18-Sep-1955, 63 y.o.   MRN: [id number]  HPI: Bruce Olson is a 63 y.o. male who returns for follow up appointment for chronic pain and medication refill. He states his pain is located in his neck radiating into his right shoulder and lower back. He rates his pain 7. His current exercise regime is walking.  Mr. Piech Morphine equivalent is 135.00MME. Last Oral Swab was Performed on 03/09/2018, it was consistent.    Pain Inventory Average Pain 7 Pain Right Now 7 My pain is sharp and stabbing  In the last 24 hours, has pain interfered with the following? General activity 7 Relation with others 7 Enjoyment of life 7 What TIME of day is your pain at its worst? all day, night Sleep (in general) Poor  Pain is worse with: walking, sitting and standing Pain improves with: heat/ice and TENS Relief from Meds: 3  Mobility walk without assistance use a cane ability to climb steps?  yes do you drive?  yes  Function disabled: date disabled 2012  Neuro/Psych weakness numbness tremor trouble walking  Prior Studies n/a  Physicians involved in your care n/a   Family History  Problem Relation Age of Onset  . Anesthesia problems Neg Hx   . Hypotension Neg Hx   . Malignant hyperthermia Neg Hx   . Pseudochol deficiency Neg Hx    Social History   Socioeconomic History  . Marital status: Single    Spouse name: Not on file  . Number of children: Not on file  . Years of education: Not on file  . Highest education level: Not on file  Occupational History  . Not on file  Social Needs  . Financial resource strain: Not on file  . Food insecurity    Worry: Not on file    Inability: Not on file  . Transportation needs    Medical: Not on file    Non-medical: Not on file  Tobacco Use  . Smoking status: Current Every Day Smoker    Packs/day: 1.00    Years: 33.00    Pack years: 33.00    Types: Cigarettes   . Smokeless tobacco: Never Used  Substance and Sexual Activity  . Alcohol use: No  . Drug use: No  . Sexual activity: Never    Comment: back problems  Lifestyle  . Physical activity    Days per week: Not on file    Minutes per session: Not on file  . Stress: Not on file  Relationships  . Social Herbalist on phone: Not on file    Gets together: Not on file    Attends religious service: Not on file    Active member of club or organization: Not on file    Attends meetings of clubs or organizations: Not on file    Relationship status: Not on file  Other Topics Concern  . Not on file  Social History Narrative  . Not on file   Past Surgical History:  Procedure Laterality Date  . BACK SURGERY  2013   Back surgery  (fusion)09/15/2010 & 2013  . BACK SURGERY  03-2013  . HARDWARE REMOVAL N/A 03/14/2013   Procedure: HARDWARE REMOVAL;  Surgeon: Eustace Moore, MD;  Location: [place] [facility];  Service: Neurosurgery;  Laterality: N/A;   Past Medical History:  Diagnosis Date  . Goals of care, counseling/discussion 04/07/2016  . Iron deficiency anemia  due to chronic blood loss 10/21/2016  . 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 No pertinent past medical history   . Occupational injury 2013   resulted in back injury that led to surgery  . Thyroid cancer ([place])    There were no vitals taken for this visit.  Opioid Risk Score:   Fall Risk Score:  `1  Depression screen PHQ 2/9  Depression screen [facility] 2/9 02/16/2018 12/22/2017 10/24/2017 06/22/2017 04/20/2017 03/20/2017 11/21/2016  Decreased Interest 0 0 0 0 0 0 0  Down, Depressed, Hopeless 0 0 0 0 0 0 0  PHQ - 2 Score 0 0 0 0 0 0 0  Altered sleeping - - - - - - -  Tired, decreased energy - - - - - - -  Change in appetite - - - - - - -  Feeling bad or failure about yourself  - - - - - - -  Trouble concentrating - - - - - - -  Moving slowly or fidgety/restless - - - - - - -  Suicidal thoughts - - - - - - -  PHQ-9 Score - - - - - - -      Review of Systems     Objective:   Physical Exam Vitals signs and nursing note reviewed.  Constitutional:      Appearance: Normal appearance.  Neck:     Musculoskeletal: Normal range of motion and neck supple.  Cardiovascular:     Rate and Rhythm: Normal rate and regular rhythm.     Pulses: Normal pulses.     Heart sounds: Normal heart sounds.  Pulmonary:     Effort: Pulmonary effort is normal.     Breath sounds: Normal breath sounds.  Neurological:     Mental Status: He is alert.           Assessment & Plan:  1. Lumbar postlaminectomy syndrome status post lumbar fusion x3: His most recent surgery was 03/14/2013. He had hardware removal and bone allograft waist and L2-3. Continue to Monitor. Continue current Medication Regimen.07/24/2018 Refilled: oxyCODONE 7.5/325mg  one tablet every 4 hours as needed #100 and Oxycontin 30 mg one tablet every 12 hours #60.  We will continue the opioid monitoring program, this consists of regular clinic visits, examinations, urine drug screen, pill counts as well as use of [place] Controlled Substance Reporting System. 2. Lumbar Radiculopathy: Continuecurrent medication regimen withLyrica.07/24/2018 3. Insomnia: Continuecurrent medication regimen withTrazodone.07/24/2018 4. Nausea: Continue Zofran as needed.Oncology Following.06//23/2020 5. Cervical Spondylosis/ Cervicalgia/ Cervical Radiculitis: Continuecurrent medication regimen withLyrica. 07/24/2018. 6. Cervical Myofascial Pain Syndrome: Continue current treatment regimen. Continue to monitor.07/24/2018  F/U in 1 month

## 2018-08-17 ENCOUNTER — Encounter: Payer: Medicare Other | Attending: Physical Medicine and Rehabilitation | Admitting: Registered Nurse

## 2018-08-17 ENCOUNTER — Other Ambulatory Visit: Payer: Self-pay

## 2018-08-17 ENCOUNTER — Encounter: Payer: Self-pay | Admitting: Registered Nurse

## 2018-08-17 VITALS — BP 141/80 | HR 84 | Temp 98.4°F | Ht 66.0 in | Wt 122.0 lb

## 2018-08-17 DIAGNOSIS — Z79899 Other long term (current) drug therapy: Secondary | ICD-10-CM | POA: Diagnosis not present

## 2018-08-17 DIAGNOSIS — M5416 Radiculopathy, lumbar region: Secondary | ICD-10-CM | POA: Diagnosis not present

## 2018-08-17 DIAGNOSIS — Z5181 Encounter for therapeutic drug level monitoring: Secondary | ICD-10-CM | POA: Diagnosis not present

## 2018-08-17 DIAGNOSIS — M5412 Radiculopathy, cervical region: Secondary | ICD-10-CM | POA: Diagnosis not present

## 2018-08-17 DIAGNOSIS — Z9889 Other specified postprocedural states: Secondary | ICD-10-CM | POA: Insufficient documentation

## 2018-08-17 DIAGNOSIS — M542 Cervicalgia: Secondary | ICD-10-CM | POA: Diagnosis not present

## 2018-08-17 DIAGNOSIS — M961 Postlaminectomy syndrome, not elsewhere classified: Secondary | ICD-10-CM | POA: Insufficient documentation

## 2018-08-17 DIAGNOSIS — M7918 Myalgia, other site: Secondary | ICD-10-CM | POA: Diagnosis not present

## 2018-08-17 DIAGNOSIS — G894 Chronic pain syndrome: Secondary | ICD-10-CM | POA: Diagnosis not present

## 2018-08-17 MED ORDER — OXYCODONE HCL ER 20 MG PO T12A: 20.0000 mg | EXTENDED_RELEASE_TABLET | Freq: Two times a day (BID) | ORAL | 0 refills | Status: DC

## 2018-08-17 MED ORDER — OXYCODONE-ACETAMINOPHEN 7.5-325 MG PO TABS: 1.0000 | ORAL_TABLET | ORAL | 0 refills | Status: DC | PRN

## 2018-08-17 NOTE — Progress Notes (Signed)
Subjective:    Patient ID: Bruce Olson, male    DOB: Sep 26, 1955, 63 y.o.   MRN: [id number]  HPI: Bruce Olson is a 63 y.o. male who returns for follow up appointment for chronic pain and medication refill. He states his pain is located in his neck radiating into his right shoulder, mid- lower back pain radiating into his right lower extremity. He rates his pain 7. His current exercise regime is walking.   Bruce Olson states when he takes his Oxycontin 30 mg he is experiencing drowsiness, we will decrease his Oxycontin to 20 mg every 12 hours, he verbalizes understanding. He agrees with slow weaning.   Bruce Olson Morphine equivalent is 135.00  MME.    Last Oral Swab was Performed on 03/09/2018, it was consistent.     Pain Inventory Average Pain 7 Pain Right Now 7 My pain is sharp, stabbing and tingling  In the last 24 hours, has pain interfered with the following? General activity 7 Relation with others 7 Enjoyment of life 7 What TIME of day is your pain at its worst? all the time Sleep (in general) Poor  Pain is worse with: walking, sitting and standing Pain improves with: heat/ice, medication and TENS Relief from Meds: 3  Mobility walk without assistance use a cane ability to climb steps?  yes do you drive?  yes  Function disabled: date disabled 2016  Neuro/Psych weakness numbness tremor  Prior Studies x-rays CT/MRI  Physicians involved in your care Dr. Sherley Bounds   Family History  Problem Relation Age of Onset  . Anesthesia problems Neg Hx   . Hypotension Neg Hx   . Malignant hyperthermia Neg Hx   . Pseudochol deficiency Neg Hx    Social History   Socioeconomic History  . Marital status: Single    Spouse name: Not on file  . Number of children: Not on file  . Years of education: Not on file  . Highest education level: Not on file  Occupational History  . Not on file  Social Needs  . Financial resource strain: Not on file  .  Food insecurity    Worry: Not on file    Inability: Not on file  . Transportation needs    Medical: Not on file    Non-medical: Not on file  Tobacco Use  . Smoking status: Current Every Day Smoker    Packs/day: 1.00    Years: 33.00    Pack years: 33.00    Types: Cigarettes  . Smokeless tobacco: Never Used  Substance and Sexual Activity  . Alcohol use: No  . Drug use: No  . Sexual activity: Never    Comment: back problems  Lifestyle  . Physical activity    Days per week: Not on file    Minutes per session: Not on file  . Stress: Not on file  Relationships  . Social Herbalist on phone: Not on file    Gets together: Not on file    Attends religious service: Not on file    Active member of club or organization: Not on file    Attends meetings of clubs or organizations: Not on file    Relationship status: Not on file  Other Topics Concern  . Not on file  Social History Narrative  . Not on file   Past Surgical History:  Procedure Laterality Date  . BACK SURGERY  2013   Back surgery  (fusion)09/15/2010 & 2013  .  BACK SURGERY  03-2013  . HARDWARE REMOVAL N/A 03/14/2013   Procedure: HARDWARE REMOVAL;  Surgeon: Eustace Moore, MD;  Location: [facility];  Service: Neurosurgery;  Laterality: N/A;   Past Medical History:  Diagnosis Date  . Goals of care, counseling/discussion 04/07/2016  . Iron deficiency anemia due to chronic blood loss 10/21/2016  . 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 No pertinent past medical history   . Occupational injury 2013   resulted in back injury that led to surgery  . Thyroid cancer ([employer])    There were no vitals taken for this visit.  Opioid Risk Score:   Fall Risk Score:  `1  Depression screen PHQ 2/9  Depression screen [facility] 2/9 02/16/2018 12/22/2017 10/24/2017 06/22/2017 04/20/2017 03/20/2017 11/21/2016  Decreased Interest 0 0 0 0 0 0 0  Down, Depressed, Hopeless 0 0 0 0 0 0 0  PHQ - 2 Score 0 0 0 0 0 0 0  Altered sleeping - - - - - - -   Tired, decreased energy - - - - - - -  Change in appetite - - - - - - -  Feeling bad or failure about yourself  - - - - - - -  Trouble concentrating - - - - - - -  Moving slowly or fidgety/restless - - - - - - -  Suicidal thoughts - - - - - - -  PHQ-9 Score - - - - - - -      Review of Systems     Objective:   Physical Exam Vitals signs and nursing note reviewed.  Constitutional:      Appearance: Normal appearance.  Neck:     Musculoskeletal: Normal range of motion and neck supple.  Cardiovascular:     Rate and Rhythm: Normal rate and regular rhythm.     Pulses: Normal pulses.     Heart sounds: Normal heart sounds.  Pulmonary:     Effort: Pulmonary effort is normal.     Breath sounds: Normal breath sounds.  Musculoskeletal:     Comments: Normal Muscle Bulk and Muscle Testing Reveals:  Upper Extremities: Full ROM and Muscle Strength 5/5 Right AC Joint Tenderness Thoracic Paraspinal Tenderness: T-1-T-4  Lumbar Hypersensitivity Lower Extremities: ull ROM and Muscle Strength 5/5 Arises from Table Slowly Antalgic Gait   Skin:    General: Skin is warm and dry.  Neurological:     Mental Status: He is alert and oriented to person, place, and time.  Psychiatric:        Mood and Affect: Mood normal.        Behavior: Behavior normal.           Assessment & Plan:  1. Lumbar postlaminectomy syndrome status post lumbar fusion x3: His most recent surgery was 03/14/2013. He had hardware removal and bone allograft waist and L2-3. Continue to Monitor. Continue current Medication Regimen.08/17/2018 Refilled: oxyCODONE 7.5/325mg  one tablet every 4 hours as needed #100 and Decreased: See HPI: [id number] 20 mg one tablet every 12 hours #60.  We will continue the opioid monitoring program, this consists of regular clinic visits, examinations, urine drug screen, pill counts as well as use of [place] Controlled Substance Reporting System. 2. Lumbar Radiculopathy:  Continuecurrent medication regimen withLyrica.08/17/2018 3. Insomnia: Continuecurrent medication regimen withTrazodone.08/17/2018 4. Nausea: Continue Zofran as needed.Oncology Following.07//17/2020 5. Cervical Spondylosis/ Cervicalgia/ Cervical Radiculitis: Continuecurrent medication regimen withLyrica. 08/17/2018. 6. Cervical Myofascial Pain Syndrome: Continue current treatment regimen. Continue to monitor.08/17/2018  15 minutes  of face to face patient care time was spent during this visit. All questions were encouraged and answered.  F/U in 1 month

## 2018-08-24 ENCOUNTER — Other Ambulatory Visit: Payer: Self-pay | Admitting: Hematology & Oncology

## 2018-09-13 ENCOUNTER — Other Ambulatory Visit: Payer: Self-pay

## 2018-09-13 ENCOUNTER — Encounter: Payer: Medicare Other | Attending: Physical Medicine and Rehabilitation | Admitting: Registered Nurse

## 2018-09-13 ENCOUNTER — Encounter: Payer: Self-pay | Admitting: Registered Nurse

## 2018-09-13 VITALS — BP 148/65 | HR 82 | Temp 98.1°F | Resp 16 | Ht 66.0 in | Wt 122.2 lb

## 2018-09-13 DIAGNOSIS — M961 Postlaminectomy syndrome, not elsewhere classified: Secondary | ICD-10-CM | POA: Diagnosis not present

## 2018-09-13 DIAGNOSIS — M5416 Radiculopathy, lumbar region: Secondary | ICD-10-CM | POA: Insufficient documentation

## 2018-09-13 DIAGNOSIS — G8929 Other chronic pain: Secondary | ICD-10-CM | POA: Diagnosis not present

## 2018-09-13 DIAGNOSIS — Z79899 Other long term (current) drug therapy: Secondary | ICD-10-CM | POA: Diagnosis not present

## 2018-09-13 DIAGNOSIS — M7918 Myalgia, other site: Secondary | ICD-10-CM | POA: Diagnosis not present

## 2018-09-13 DIAGNOSIS — G894 Chronic pain syndrome: Secondary | ICD-10-CM | POA: Diagnosis not present

## 2018-09-13 DIAGNOSIS — Z5181 Encounter for therapeutic drug level monitoring: Secondary | ICD-10-CM | POA: Insufficient documentation

## 2018-09-13 DIAGNOSIS — M546 Pain in thoracic spine: Secondary | ICD-10-CM | POA: Diagnosis not present

## 2018-09-13 DIAGNOSIS — M5412 Radiculopathy, cervical region: Secondary | ICD-10-CM

## 2018-09-13 DIAGNOSIS — Z79891 Long term (current) use of opiate analgesic: Secondary | ICD-10-CM | POA: Diagnosis not present

## 2018-09-13 DIAGNOSIS — M542 Cervicalgia: Secondary | ICD-10-CM | POA: Diagnosis not present

## 2018-09-13 DIAGNOSIS — Z9889 Other specified postprocedural states: Secondary | ICD-10-CM | POA: Diagnosis not present

## 2018-09-13 MED ORDER — OXYCODONE-ACETAMINOPHEN 7.5-325 MG PO TABS: 1.0000 | ORAL_TABLET | ORAL | 0 refills | Status: DC | PRN

## 2018-09-13 MED ORDER — OXYCODONE HCL ER 20 MG PO T12A: 20.0000 mg | EXTENDED_RELEASE_TABLET | Freq: Two times a day (BID) | ORAL | 0 refills | Status: DC

## 2018-09-13 NOTE — Progress Notes (Signed)
Subjective:    Patient ID: Bruce Olson, male    DOB: 1955-11-11, 63 y.o.   MRN: [id number]  HPI: Bruce Olson is a 63 y.o. male who returns for follow up appointment for chronic pain and medication refill. He states his pain is located in his neck radiating into his right shoulder and mid- lower back pain. He rate his  Pain 7. His  current exercise regime is walking and performing stretching exercises.  Bruce Olson Morphine equivalent is 105.00  MME.  Last Oral Swab was Performed on 03/09/2018, it was consistent.   Pain Inventory Average Pain 7 Pain Right Now 7 My pain is sharp, stabbing and tingling  In the last 24 hours, has pain interfered with the following? General activity 7 Relation with others 7 Enjoyment of life 7 What TIME of day is your pain at its worst? all Sleep (in general) Poor  Pain is worse with: walking, sitting and standing Pain improves with: heat/ice, medication and TENS Relief from Meds: 3  Mobility walk without assistance use a cane ability to climb steps?  yes do you drive?  yes  Function disabled: date disabled 2012  Neuro/Psych weakness numbness tremor trouble walking  Prior Studies Any changes since last visit?  no  Physicians involved in your care Any changes since last visit?  no   Family History  Problem Relation Age of Onset  . Anesthesia problems Neg Hx   . Hypotension Neg Hx   . Malignant hyperthermia Neg Hx   . Pseudochol deficiency Neg Hx    Social History   Socioeconomic History  . Marital status: Single    Spouse name: Not on file  . Number of children: Not on file  . Years of education: Not on file  . Highest education level: Not on file  Occupational History  . Not on file  Social Needs  . Financial resource strain: Not on file  . Food insecurity    Worry: Not on file    Inability: Not on file  . Transportation needs    Medical: Not on file    Non-medical: Not on file  Tobacco Use  .  Smoking status: Current Every Day Smoker    Packs/day: 1.00    Years: 33.00    Pack years: 33.00    Types: Cigarettes  . Smokeless tobacco: Never Used  Substance and Sexual Activity  . Alcohol use: No  . Drug use: No  . Sexual activity: Never    Comment: back problems  Lifestyle  . Physical activity    Days per week: Not on file    Minutes per session: Not on file  . Stress: Not on file  Relationships  . Social Herbalist on phone: Not on file    Gets together: Not on file    Attends religious service: Not on file    Active member of club or organization: Not on file    Attends meetings of clubs or organizations: Not on file    Relationship status: Not on file  Other Topics Concern  . Not on file  Social History Narrative  . Not on file   Past Surgical History:  Procedure Laterality Date  . BACK SURGERY  2013   Back surgery  (fusion)09/15/2010 & 2013  . BACK SURGERY  03-2013  . HARDWARE REMOVAL N/A 03/14/2013   Procedure: HARDWARE REMOVAL;  Surgeon: Eustace Moore, MD;  Location: [facility];  Service: Neurosurgery;  Laterality: N/A;   Past Medical History:  Diagnosis Date  . Goals of care, counseling/discussion 04/07/2016  . Iron deficiency anemia due to chronic blood loss 10/21/2016  . 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 No pertinent past medical history   . Occupational injury 2013   resulted in back injury that led to surgery  . Thyroid cancer ([place])    There were no vitals taken for this visit.  Opioid Risk Score:   Fall Risk Score:  `1  Depression screen PHQ 2/9  Depression screen [facility] 2/9 02/16/2018 12/22/2017 10/24/2017 06/22/2017 04/20/2017 03/20/2017 11/21/2016  Decreased Interest 0 0 0 0 0 0 0  Down, Depressed, Hopeless 0 0 0 0 0 0 0  PHQ - 2 Score 0 0 0 0 0 0 0  Altered sleeping - - - - - - -  Tired, decreased energy - - - - - - -  Change in appetite - - - - - - -  Feeling bad or failure about yourself  - - - - - - -  Trouble concentrating - - - - -  - -  Moving slowly or fidgety/restless - - - - - - -  Suicidal thoughts - - - - - - -  PHQ-9 Score - - - - - - -     Review of Systems  Constitutional: Negative.   HENT: Negative.   Eyes: Negative.   Respiratory: Negative.   Cardiovascular: Negative.   Gastrointestinal: Negative.   Endocrine: Negative.   Genitourinary: Negative.   Musculoskeletal: Positive for back pain, gait problem and myalgias.  Skin: Negative.   Allergic/Immunologic: Negative.   Neurological: Positive for tremors, weakness and numbness.  Psychiatric/Behavioral: Negative.   All other systems reviewed and are negative.      Objective:   Physical Exam Vitals signs and nursing note reviewed.  Constitutional:      Appearance: Normal appearance.  Neck:     Musculoskeletal: Normal range of motion and neck supple.  Cardiovascular:     Rate and Rhythm: Normal rate and regular rhythm.     Pulses: Normal pulses.     Heart sounds: Normal heart sounds.  Pulmonary:     Effort: Pulmonary effort is normal.     Breath sounds: Normal breath sounds.  Musculoskeletal:     Comments: Normal Muscle Bulk and Muscle Testing Reveals:  Upper Extremities: Decreased ROM 90 Degrees  and Muscle Strength 5/5   Thoracic and  Lumbar Hypersensitivity Lower Extremities: Full ROM and Muscle Strength 5/5 Arises from  Gait   Skin:    General: Skin is warm and dry.  Neurological:     Mental Status: He is alert and oriented to person, place, and time.  Psychiatric:        Mood and Affect: Mood normal.        Behavior: Behavior normal.           Assessment & Plan:  1. Lumbar postlaminectomy syndrome status post lumbar fusion x3: His most recent surgery was 03/14/2013. He had hardware removal and bone allograft waist and L2-3. Continue to Monitor. Continue current Medication Regimen.09/13/2018 Refilled: oxyCODONE 7.5/325mg  one tablet every 4 hours as needed #100 and :Oxycontin 20 mg one tablet every 12 hours #60.  We will  continue the opioid monitoring program, this consists of regular clinic visits, examinations, urine drug screen, pill counts as well as use of [place] Controlled Substance Reporting System. 2. Lumbar Radiculopathy: Continuecurrent medication regimen withLyrica.09/13/2018 3. Insomnia: Continuecurrent medication regimen  withTrazodone.09/13/2018 4. Nausea: Continue Zofran as needed.Oncology Following.08//13/2020 5. Cervical Spondylosis/ Cervicalgia/ Cervical Radiculitis: Continuecurrent medication regimen withLyrica. 09/13/2018. 6. Cervical Myofascial Pain Syndrome: Continue current treatment regimen. Continue to monitor.09/13/2018  15 minutes of face to face patient care time was spent during this visit. All questions were encouraged and answered.  F/U in 1 month

## 2018-09-19 LAB — DRUG TOX MONITOR 1 W/CONF, ORAL FLD
Amphetamines: NEGATIVE ng/mL (ref <10)
Barbiturates: NEGATIVE ng/mL (ref <10)
Benzodiazepines: NEGATIVE ng/mL (ref <0.50)
Buprenorphine: NEGATIVE ng/mL (ref <0.10)
Cocaine: NEGATIVE ng/mL (ref <5.0)
Codeine: NEGATIVE ng/mL (ref <2.5)
Cotinine: 93 ng/mL — ABNORMAL HIGH (ref <5.0)
Dihydrocodeine: NEGATIVE ng/mL (ref <2.5)
Fentanyl: NEGATIVE ng/mL (ref <0.10)
Heroin Metabolite: NEGATIVE ng/mL (ref <1.0)
Hydrocodone: NEGATIVE ng/mL (ref <2.5)
Hydromorphone: NEGATIVE ng/mL (ref <2.5)
MARIJUANA: NEGATIVE ng/mL (ref <2.5)
MDMA: NEGATIVE ng/mL (ref <10)
Meprobamate: NEGATIVE ng/mL (ref <2.5)
Methadone: NEGATIVE ng/mL (ref <5.0)
Morphine: NEGATIVE ng/mL (ref <2.5)
Nicotine Metabolite: POSITIVE ng/mL — AB (ref <5.0)
Norhydrocodone: NEGATIVE ng/mL (ref <2.5)
Noroxycodone: 37.2 ng/mL — ABNORMAL HIGH (ref <2.5)
Opiates: POSITIVE ng/mL — AB (ref <2.5)
Oxycodone: 172.2 ng/mL — ABNORMAL HIGH (ref <2.5)
Oxymorphone: NEGATIVE ng/mL (ref <2.5)
Phencyclidine: NEGATIVE ng/mL (ref <10)
Tapentadol: NEGATIVE ng/mL (ref <5.0)
Tramadol: NEGATIVE ng/mL (ref <5.0)
Zolpidem: NEGATIVE ng/mL (ref <5.0)

## 2018-09-19 LAB — DRUG TOX ALC METAB W/CON, ORAL FLD: Alcohol Metabolite: NEGATIVE ng/mL (ref <25)

## 2018-09-20 ENCOUNTER — Telehealth: Payer: Self-pay | Admitting: *Deleted

## 2018-09-20 NOTE — Telephone Encounter (Signed)
Oral swab drug screen was consistent for prescribed medications.  ?

## 2018-10-05 ENCOUNTER — Other Ambulatory Visit: Payer: Medicare Other

## 2018-10-05 ENCOUNTER — Ambulatory Visit: Payer: Medicare Other | Admitting: Hematology & Oncology

## 2018-10-15 ENCOUNTER — Encounter: Payer: Medicare Other | Attending: Physical Medicine and Rehabilitation | Admitting: Registered Nurse

## 2018-10-15 ENCOUNTER — Other Ambulatory Visit: Payer: Self-pay

## 2018-10-15 ENCOUNTER — Encounter: Payer: Self-pay | Admitting: Registered Nurse

## 2018-10-15 VITALS — BP 137/74 | HR 77 | Temp 98.7°F | Ht 66.0 in | Wt 123.6 lb

## 2018-10-15 DIAGNOSIS — G894 Chronic pain syndrome: Secondary | ICD-10-CM | POA: Diagnosis not present

## 2018-10-15 DIAGNOSIS — G8929 Other chronic pain: Secondary | ICD-10-CM | POA: Diagnosis not present

## 2018-10-15 DIAGNOSIS — M545 Low back pain, unspecified: Secondary | ICD-10-CM

## 2018-10-15 DIAGNOSIS — Z5181 Encounter for therapeutic drug level monitoring: Secondary | ICD-10-CM | POA: Insufficient documentation

## 2018-10-15 DIAGNOSIS — M961 Postlaminectomy syndrome, not elsewhere classified: Secondary | ICD-10-CM

## 2018-10-15 DIAGNOSIS — M542 Cervicalgia: Secondary | ICD-10-CM | POA: Diagnosis not present

## 2018-10-15 DIAGNOSIS — M7918 Myalgia, other site: Secondary | ICD-10-CM

## 2018-10-15 DIAGNOSIS — Z79899 Other long term (current) drug therapy: Secondary | ICD-10-CM | POA: Insufficient documentation

## 2018-10-15 DIAGNOSIS — M5412 Radiculopathy, cervical region: Secondary | ICD-10-CM | POA: Diagnosis not present

## 2018-10-15 DIAGNOSIS — Z9889 Other specified postprocedural states: Secondary | ICD-10-CM | POA: Insufficient documentation

## 2018-10-15 DIAGNOSIS — M5416 Radiculopathy, lumbar region: Secondary | ICD-10-CM | POA: Insufficient documentation

## 2018-10-15 DIAGNOSIS — M546 Pain in thoracic spine: Secondary | ICD-10-CM

## 2018-10-15 MED ORDER — OXYCODONE HCL ER 20 MG PO T12A: 20.0000 mg | EXTENDED_RELEASE_TABLET | Freq: Two times a day (BID) | ORAL | 0 refills | Status: DC

## 2018-10-15 MED ORDER — OXYCODONE-ACETAMINOPHEN 7.5-325 MG PO TABS: 1.0000 | ORAL_TABLET | ORAL | 0 refills | Status: DC | PRN

## 2018-10-15 NOTE — Progress Notes (Signed)
Subjective:    Patient ID: Bruce Olson, male    DOB: Dec 03, 1955, 63 y.o.   MRN: [id number]  HPI: Bruce Olson is a 63 y.o. male who returns for follow up appointment for chronic pain and medication refill. He states his pain is located in his neck radiating into his right shoulder and mid- lower back pain. He rates his pain 7. His current exercise regime is walking and performing stretching exercises.  Bruce Olson Morphine equivalent is 105.00 MME.  Last Oral Swab was Performed on 09/13/2018, it was consistent.   Pain Inventory Average Pain 7 Pain Right Now 7 My pain is sharp, stabbing and tingling  In the last 24 hours, has pain interfered with the following? General activity 7 Relation with others 7 Enjoyment of life 7 What TIME of day is your pain at its worst? all day Sleep (in general) Poor  Pain is worse with: walking, sitting and standing Pain improves with: heat/ice, medication and TENS Relief from Meds: 3  Mobility walk without assistance use a cane use a walker how many minutes can you walk? 1/2 mile ability to climb steps?  yes do you drive?  yes  Function disabled: date disabled 05/2010  Neuro/Psych weakness numbness tremor trouble walking  Prior Studies Any changes since last visit?  no  Physicians involved in your care Any changes since last visit?  no   Family History  Problem Relation Age of Onset  . Anesthesia problems Neg Hx   . Hypotension Neg Hx   . Malignant hyperthermia Neg Hx   . Pseudochol deficiency Neg Hx    Social History   Socioeconomic History  . Marital status: Single    Spouse name: Not on file  . Number of children: Not on file  . Years of education: Not on file  . Highest education level: Not on file  Occupational History  . Not on file  Social Needs  . Financial resource strain: Not on file  . Food insecurity    Worry: Not on file    Inability: Not on file  . Transportation needs    Medical: Not  on file    Non-medical: Not on file  Tobacco Use  . Smoking status: Current Every Day Smoker    Packs/day: 1.00    Years: 33.00    Pack years: 33.00    Types: Cigarettes  . Smokeless tobacco: Never Used  Substance and Sexual Activity  . Alcohol use: No  . Drug use: No  . Sexual activity: Never    Comment: back problems  Lifestyle  . Physical activity    Days per week: Not on file    Minutes per session: Not on file  . Stress: Not on file  Relationships  . Social Herbalist on phone: Not on file    Gets together: Not on file    Attends religious service: Not on file    Active member of club or organization: Not on file    Attends meetings of clubs or organizations: Not on file    Relationship status: Not on file  Other Topics Concern  . Not on file  Social History Narrative  . Not on file   Past Surgical History:  Procedure Laterality Date  . BACK SURGERY  2013   Back surgery  (fusion)09/15/2010 & 2013  . BACK SURGERY  03-2013  . HARDWARE REMOVAL N/A 03/14/2013   Procedure: HARDWARE REMOVAL;  Surgeon: Bruce Moore, MD;  Location: [facility];  Service: Neurosurgery;  Laterality: N/A;   Past Medical History:  Diagnosis Date  . Goals of care, counseling/discussion 04/07/2016  . Iron deficiency anemia due to chronic blood loss 10/21/2016  . 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 No pertinent past medical history   . Occupational injury 2013   resulted in back injury that led to surgery  . Thyroid cancer ([facility])    BP 137/74   Pulse 77   Temp 98.7 F (37.1 C)   Ht 5\' 6"  (1.676 m)   Wt 123 lb 9.6 oz (56.1 kg)   SpO2 93%   BMI 19.95 kg/m   Opioid Risk Score:   Fall Risk Score:  `1  Depression screen PHQ 2/9  Depression screen [facility] 2/9 02/16/2018 12/22/2017 10/24/2017 06/22/2017 04/20/2017 03/20/2017 11/21/2016  Decreased Interest 0 0 0 0 0 0 0  Down, Depressed, Hopeless 0 0 0 0 0 0 0  PHQ - 2 Score 0 0 0 0 0 0 0  Altered sleeping - - - - - - -  Tired, decreased  energy - - - - - - -  Change in appetite - - - - - - -  Feeling bad or failure about yourself  - - - - - - -  Trouble concentrating - - - - - - -  Moving slowly or fidgety/restless - - - - - - -  Suicidal thoughts - - - - - - -  PHQ-9 Score - - - - - - -    Review of Systems  Constitutional: Negative.   HENT: Negative.   Eyes: Negative.   Respiratory: Negative.   Cardiovascular: Negative.   Gastrointestinal: Negative.   Endocrine: Negative.   Genitourinary: Negative.   Musculoskeletal: Positive for gait problem.  Skin: Negative.   Allergic/Immunologic: Negative.   Neurological: Positive for tremors, weakness and numbness.  Hematological: Negative.   Psychiatric/Behavioral: Negative.   All other systems reviewed and are negative.      Objective:   Physical Exam Vitals signs and nursing note reviewed.  Constitutional:      Appearance: Normal appearance.  Neck:     Musculoskeletal: Normal range of motion and neck supple.     Comments: Cervical Paraspinal Tenderness: C-5-C-6 Cardiovascular:     Rate and Rhythm: Normal rate and regular rhythm.     Pulses: Normal pulses.     Heart sounds: Normal heart sounds.  Pulmonary:     Effort: Pulmonary effort is normal.     Breath sounds: Normal breath sounds.  Musculoskeletal:     Comments: Normal Muscle Bulk and Muscle Testing Reveals:  Upper Extremities: Full ROM and Muscle Strength 5/5 Right AC Joint Tenderness  Thoracic and Lumbar Hypersensitivity Lower Extremities: Full ROM and Muscle Strength 5/5 Arises from Table Slowly  Narrow Based  Gait   Skin:    General: Skin is warm and dry.  Neurological:     Mental Status: He is alert and oriented to person, place, and time.  Psychiatric:        Mood and Affect: Mood normal.        Behavior: Behavior normal.           Assessment & Plan:  1. Lumbar postlaminectomy syndrome status post lumbar fusion x3: His most recent surgery was 03/14/2013. He had hardware removal and  bone allograft waist and L2-3. Continue to Monitor. Continue current Medication Regimen.10/15/2018 Refilled: oxyCODONE 7.5/325mg  one tablet every 4 hours as needed #100 [id number] mg one  tablet every 12 hours #60.  We will continue the opioid monitoring program, this consists of regular clinic visits, examinations, urine drug screen, pill counts as well as use of [place] Controlled Substance Reporting System. 2. Lumbar Radiculopathy: Continuecurrent medication regimen withLyrica.10/15/2018 3. Insomnia: Continuecurrent medication regimen withTrazodone.10/15/2018 4. Nausea: Continue Zofran as needed.Oncology Following.10/15/2018 5. Cervical Spondylosis/ Cervicalgia/ Cervical Radiculitis: Continuecurrent medication regimen withLyrica. 10/15/2018. 6. Cervical Myofascial Pain Syndrome: Continue current treatment regimen. Continue to monitor.10/15/2018  87minutes of face to face patient care time was spent during this visit. All questions were encouraged and answered.  F/U in 1 month

## 2018-10-19 IMAGING — DX DG CHEST 2V
2 series · 2 of 2 positions shown · non-contrast
Comparison: March 11, 2013 chest radiograph ; PET-CT September 02, 2016

CLINICAL DATA: Cough and shortness of breath. History of tonsil
carcinoma

EXAM:
CHEST  2 VIEW

[chest pa]
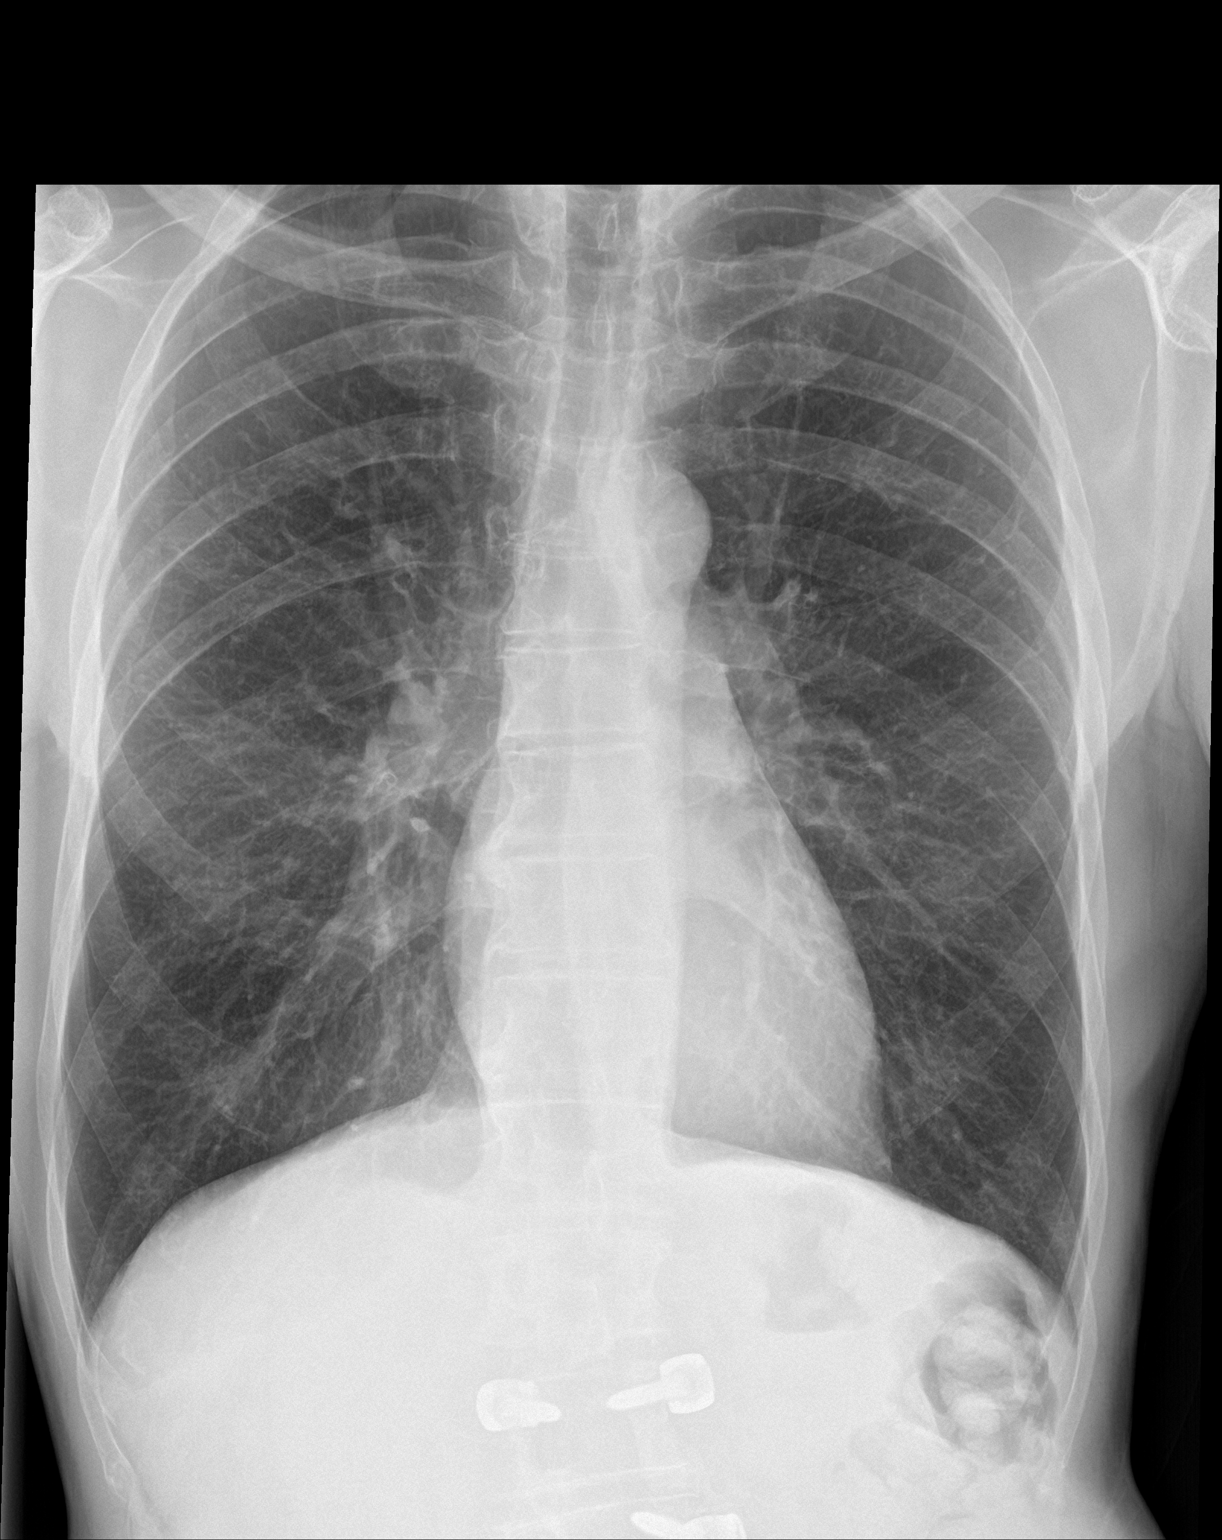

[chest lat]
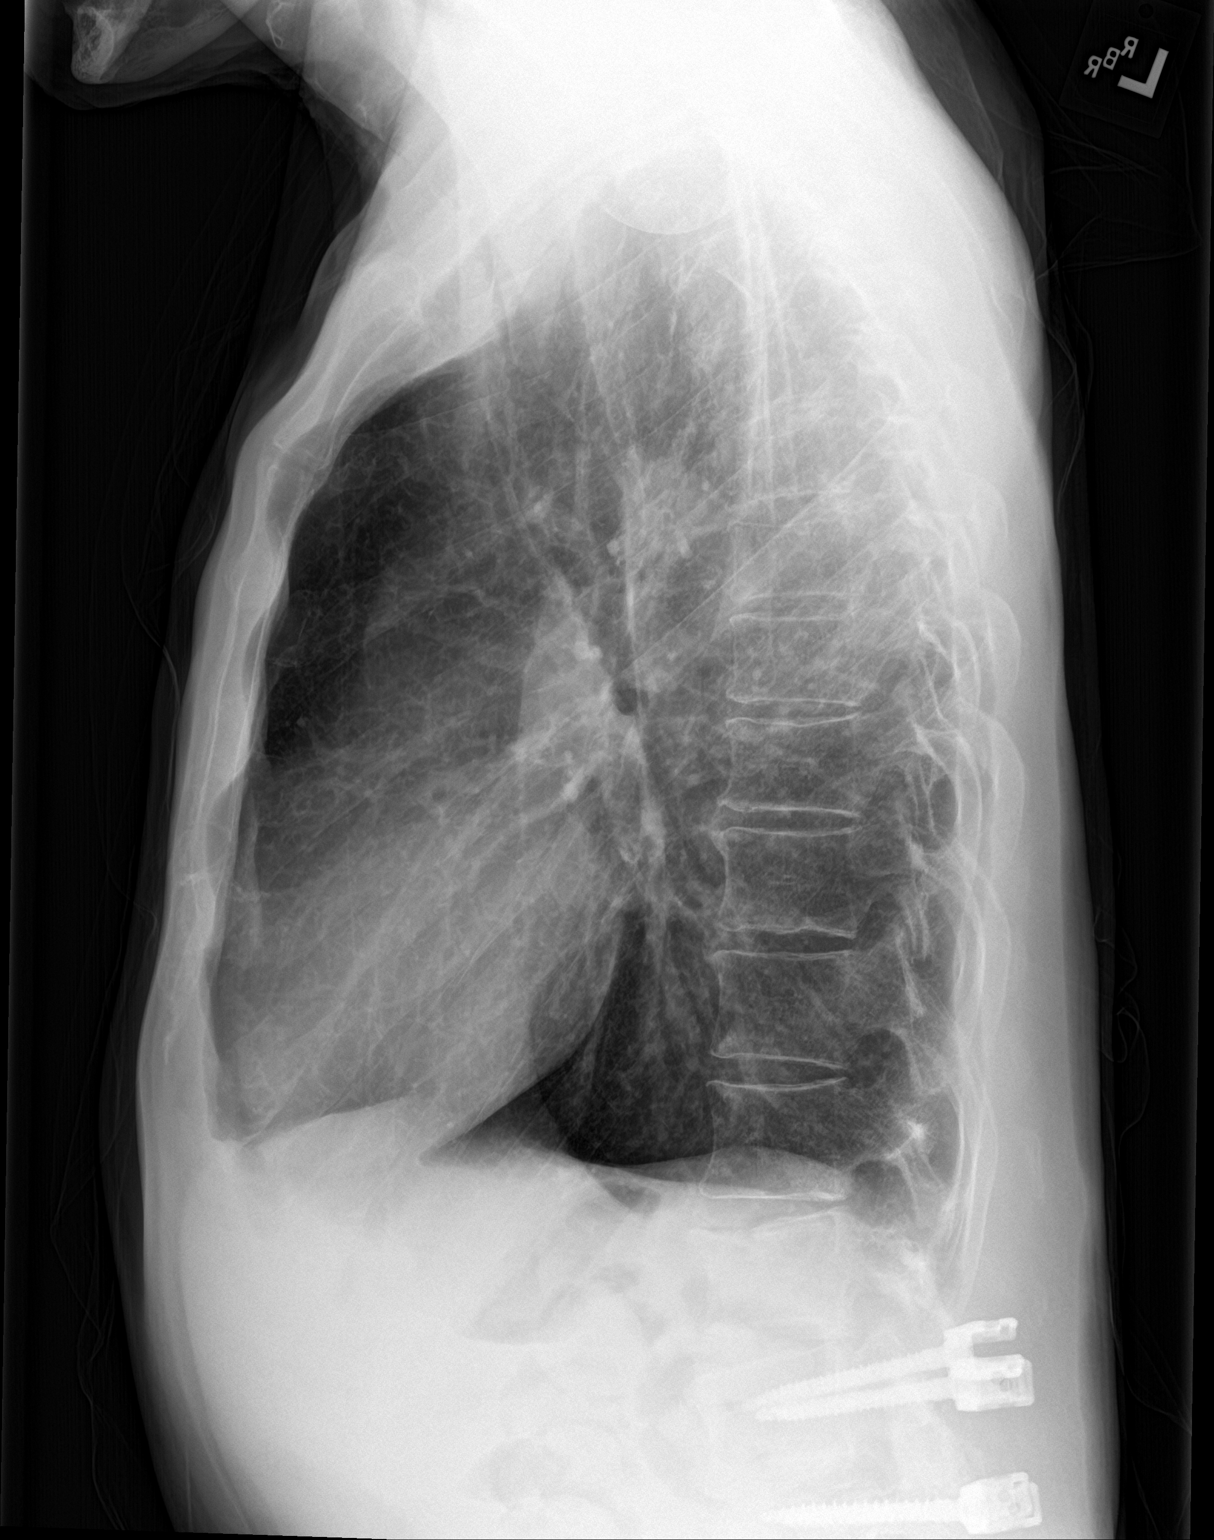

[2 of 2 positions shown; findings below may reference images not displayed]

FINDINGS: Lungs are mildly hyperexpanded but clear. Heart size and pulmonary
vascularity are normal. No adenopathy. Postoperative changes noted
in the upper lumbar spine. No blastic or lytic bone lesions.
IMPRESSION: Lungs mildly hyperexpanded. No lung edema or consolidation. No
demonstrable neoplastic focus.

## 2018-11-02 ENCOUNTER — Inpatient Hospital Stay (HOSPITAL_BASED_OUTPATIENT_CLINIC_OR_DEPARTMENT_OTHER): Payer: Medicare Other | Admitting: Hematology & Oncology

## 2018-11-02 ENCOUNTER — Inpatient Hospital Stay: Payer: Medicare Other | Attending: Hematology & Oncology

## 2018-11-02 ENCOUNTER — Other Ambulatory Visit: Payer: Self-pay

## 2018-11-02 ENCOUNTER — Encounter: Payer: Self-pay | Admitting: Hematology & Oncology

## 2018-11-02 VITALS — BP 141/74 | HR 77 | Temp 98.6°F | Resp 19 | Wt 123.4 lb

## 2018-11-02 DIAGNOSIS — C090 Malignant neoplasm of tonsillar fossa: Secondary | ICD-10-CM | POA: Diagnosis not present

## 2018-11-02 DIAGNOSIS — Z9221 Personal history of antineoplastic chemotherapy: Secondary | ICD-10-CM | POA: Diagnosis not present

## 2018-11-02 DIAGNOSIS — Z923 Personal history of irradiation: Secondary | ICD-10-CM | POA: Diagnosis not present

## 2018-11-02 DIAGNOSIS — K117 Disturbances of salivary secretion: Secondary | ICD-10-CM | POA: Insufficient documentation

## 2018-11-02 DIAGNOSIS — R5383 Other fatigue: Secondary | ICD-10-CM | POA: Insufficient documentation

## 2018-11-02 DIAGNOSIS — R1110 Vomiting, unspecified: Secondary | ICD-10-CM | POA: Insufficient documentation

## 2018-11-02 DIAGNOSIS — D500 Iron deficiency anemia secondary to blood loss (chronic): Secondary | ICD-10-CM | POA: Diagnosis not present

## 2018-11-02 DIAGNOSIS — E034 Atrophy of thyroid (acquired): Secondary | ICD-10-CM | POA: Insufficient documentation

## 2018-11-02 DIAGNOSIS — F17210 Nicotine dependence, cigarettes, uncomplicated: Secondary | ICD-10-CM | POA: Diagnosis not present

## 2018-11-02 DIAGNOSIS — Z79899 Other long term (current) drug therapy: Secondary | ICD-10-CM | POA: Insufficient documentation

## 2018-11-02 DIAGNOSIS — J029 Acute pharyngitis, unspecified: Secondary | ICD-10-CM | POA: Insufficient documentation

## 2018-11-02 DIAGNOSIS — Y842 Radiological procedure and radiotherapy as the cause of abnormal reaction of the patient, or of later complication, without mention of misadventure at the time of the procedure: Secondary | ICD-10-CM

## 2018-11-02 LAB — CBC WITH DIFFERENTIAL (CANCER CENTER ONLY)
Abs Immature Granulocytes: 0.01 10*3/uL (ref 0.00–0.07)
Basophils Absolute: 0 10*3/uL (ref 0.0–0.1)
Basophils Relative: 1 %
Eosinophils Absolute: 0.1 10*3/uL (ref 0.0–0.5)
Eosinophils Relative: 3 %
HCT: 35.5 % — ABNORMAL LOW (ref 39.0–52.0)
Hemoglobin: 12.1 g/dL — ABNORMAL LOW (ref 13.0–17.0)
Immature Granulocytes: 0 %
Lymphocytes Relative: 30 %
Lymphs Abs: 1.4 10*3/uL (ref 0.7–4.0)
MCH: 30.3 pg (ref 26.0–34.0)
MCHC: 34.1 g/dL (ref 30.0–36.0)
MCV: 89 fL (ref 80.0–100.0)
Monocytes Absolute: 0.7 10*3/uL (ref 0.1–1.0)
Monocytes Relative: 15 %
Neutro Abs: 2.5 10*3/uL (ref 1.7–7.7)
Neutrophils Relative %: 51 %
Platelet Count: 209 10*3/uL (ref 150–400)
RBC: 3.99 MIL/uL — ABNORMAL LOW (ref 4.22–5.81)
RDW: 12.4 % (ref 11.5–15.5)
WBC Count: 4.8 10*3/uL (ref 4.0–10.5)
nRBC: 0 % (ref 0.0–0.2)

## 2018-11-02 LAB — CMP (CANCER CENTER ONLY)
ALT: 18 U/L (ref 0–44)
AST: 22 U/L (ref 15–41)
Albumin: 3.9 g/dL (ref 3.5–5.0)
Alkaline Phosphatase: 60 U/L (ref 38–126)
Anion gap: 9 (ref 5–15)
BUN: 15 mg/dL (ref 8–23)
CO2: 25 mmol/L (ref 22–32)
Calcium: 8.8 mg/dL — ABNORMAL LOW (ref 8.9–10.3)
Chloride: 98 mmol/L (ref 98–111)
Creatinine: 1.06 mg/dL (ref 0.61–1.24)
GFR, Est AFR Am: >60 mL/min (ref >60)
GFR, Estimated: >60 mL/min (ref >60)
Glucose, Bld: 85 mg/dL (ref 70–99)
Potassium: 4.2 mmol/L (ref 3.5–5.1)
Sodium: 132 mmol/L — ABNORMAL LOW (ref 135–145)
Total Bilirubin: 0.4 mg/dL (ref 0.3–1.2)
Total Protein: 6.9 g/dL (ref 6.5–8.1)

## 2018-11-02 MED ORDER — DRONABINOL 2.5 MG PO CAPS: 2.5000 mg | ORAL_CAPSULE | Freq: Two times a day (BID) | ORAL | 0 refills | Status: DC

## 2018-11-02 NOTE — Progress Notes (Signed)
Hematology and Oncology Follow Up Visit  Bruce HANDYSIDE [id number] [birth year] 63 y.o. 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 Steward is here today for follow-up.  Unfortunately, he still having some issues.  He is still having the issues with vomiting.  I just do not know what else to do about this.  I am not sure anything can be done about this.  His weight is holding steady, at least.    I think he is still eating decently.  He still is having trouble sleeping.  Again, we have tried everything for this.  I do not know of anything else that we can do try to help with the sleeping.  Maybe his family doctor could help out with this.  He is nice and tan.  He does work outside.  He does like doing what he can do outside.  He is still smoking a pack per day of cigarettes.  Overall, his performance status is ECOG 1.     Medications:  Allergies as of 11/02/2018   No Known Allergies     Medication List       Accurate as of November 02, 2018  3:21 PM. If you have any questions, ask your nurse or doctor.        docusate sodium 100 MG capsule Commonly known as: COLACE Take 100 mg by mouth daily.   metoCLOPramide 10 MG tablet Commonly known as: REGLAN Take 1 tablet (10 mg total) by mouth 4 (four) times daily.   multivitamin with minerals Tabs tablet Take 1 tablet by mouth daily.   OLANZapine 10 MG tablet Commonly known as: ZYPREXA TAKE 1 TABLET BY MOUTH EACH NIGHT AT BEDTIME   oxyCODONE 20 mg 12 hr tablet Commonly known as: OxyCONTIN Take 1 tablet (20 mg total) by mouth every 12 (twelve) hours.   oxyCODONE-acetaminophen 7.5-325 MG tablet Commonly known as: Percocet Take 1 tablet by mouth every 4 (four) hours as needed. May take one extra tablet when pain is sever. No  more than 4 a day   pantoprazole 40 MG tablet Commonly known as: Protonix Take 1 tablet (40 mg total) by mouth 2 (two) times daily before a meal.   pilocarpine 5 MG tablet Commonly known as: SALAGEN TAKE 1 TABLET BY MOUTH 2 TIMES A DAY   pregabalin 100 MG capsule Commonly known as: Lyrica TAKE 1 CAPSULE BY MOUTH THREE TIMES A DAY   temazepam 15 MG capsule Commonly known as: RESTORIL Take 1 capsule (15 mg total) by mouth at bedtime as needed for sleep.       Allergies: No Known Allergies  Past Medical History, Surgical history, Social history, and Family History were reviewed and updated.  Review of Systems: Review of Systems  Constitutional: Positive for malaise/fatigue.  HENT: Positive for sore throat.   Eyes: Negative.   Respiratory: Negative.   Cardiovascular: Negative.   Gastrointestinal: Negative.   Genitourinary: Positive for dysuria.  Musculoskeletal: Positive for joint pain and myalgias.  Skin: Negative.   Neurological: Positive for sensory change.  Endo/Heme/Allergies: Negative.   All other systems reviewed and are negative.   Physical Exam:  weight is 123 lb 6.4 oz (56 kg). His temporal temperature is 98.6 F (37 C). His blood pressure is 141/74 (abnormal) and his pulse is 77. His  respiration is 19 and oxygen saturation is 96%.   Wt Readings from Last 3 Encounters:  11/02/18 123 lb 6.4 oz (56 kg)  10/15/18 123 lb 9.6 oz (56.1 kg)  09/13/18 122 lb 3.2 oz (55.4 kg)    Physical Exam Vitals signs reviewed.  HENT:     Head: Normocephalic and atraumatic.  Eyes:     Pupils: Pupils are equal, round, and reactive to light.  Neck:     Musculoskeletal: Normal range of motion.  Cardiovascular:     Rate and Rhythm: Normal rate and regular rhythm.     Heart sounds: Normal heart sounds.  Pulmonary:     Effort: Pulmonary effort is normal.     Breath sounds: Normal breath sounds.  Abdominal:     General: Bowel sounds are normal.     Palpations: Abdomen is  soft.  Musculoskeletal: Normal range of motion.        General: No tenderness or deformity.  Lymphadenopathy:     Cervical: No cervical adenopathy.  Skin:    General: Skin is warm and dry.     Findings: No erythema or rash.  Neurological:     Mental Status: He is alert and oriented to person, place, and time.  Psychiatric:        Behavior: Behavior normal.        Thought Content: Thought content normal.        Judgment: Judgment normal.      Lab Results  Component Value Date   WBC 4.8 11/02/2018   HGB 12.1 (L) 11/02/2018   HCT 35.5 (L) 11/02/2018   MCV 89.0 11/02/2018   PLT 209 11/02/2018   Lab Results  Component Value Date   FERRITIN 200 07/06/2018   IRON 34 (L) 07/06/2018   TIBC 281 07/06/2018   UIBC 247 07/06/2018   IRONPCTSAT 12 (L) 07/06/2018   Lab Results  Component Value Date   RBC 3.99 (L) 11/02/2018   No results found for: KPAFRELGTCHN, LAMBDASER, KAPLAMBRATIO No results found for: IGGSERUM, IGA, IGMSERUM No results found for: Odetta Pink, SPEI   Chemistry      Component Value Date/Time   NA 137 07/06/2018 0958   NA 138 01/20/2017 1324   NA 135 (L) 04/05/2016 1333   K 4.4 07/06/2018 0958   K 4.5 01/20/2017 1324   K 4.7 04/05/2016 1333   CL 102 07/06/2018 0958   CL 97 (L) 01/20/2017 1324   CO2 28 07/06/2018 0958   CO2 30 01/20/2017 1324   CO2 29 04/05/2016 1333   BUN 16 07/06/2018 0958   BUN 15 01/20/2017 1324   BUN 23.6 04/05/2016 1333   CREATININE 1.12 07/06/2018 0958   CREATININE 1.2 01/20/2017 1324   CREATININE 1.1 04/05/2016 1333      Component Value Date/Time   CALCIUM 8.6 (L) 07/06/2018 0958   CALCIUM 9.2 01/20/2017 1324   CALCIUM 9.2 04/05/2016 1333   ALKPHOS 66 07/06/2018 0958   ALKPHOS 51 01/20/2017 1324   ALKPHOS 67 04/05/2016 1333   AST 21 07/06/2018 0958   AST 17 04/05/2016 1333   ALT 22 07/06/2018 0958   ALT 26 01/20/2017 1324   ALT 10 04/05/2016 1333   BILITOT 0.3  07/06/2018 0958   BILITOT 0.30 04/05/2016 1333      Impression and Plan: Mr. Vandenheuvel is a very pleasant 63 yo caucasian gentleman with history of squamous cell carcinoma of the left tonsil, HPV positive.  Again, I do  think he has any problems with recurrent disease.  We will get him back after the holidays now.  I think we get him back in early 2021.  He was very kind and did not bring his medicines in with him.  He really is not taking all that much.  I will refill the Marinol.  Hopefully this might help him a little bit.      [name], MD 10/2/20203:21 PM

## 2018-11-03 MED ORDER — DRONABINOL 5 MG PO CAPS: 5.0000 mg | ORAL_CAPSULE | Freq: Two times a day (BID) | ORAL | 0 refills | Status: DC

## 2018-11-05 LAB — TSH: TSH: 2.442 u[IU]/mL (ref 0.320–4.118)

## 2018-11-05 NOTE — Addendum Note (Signed)
Addended by: Burney Gauze R on: 11/05/2018 10:29 AM   Modules accepted: Orders

## 2018-11-14 ENCOUNTER — Encounter: Payer: Medicare Other | Attending: Physical Medicine and Rehabilitation | Admitting: Registered Nurse

## 2018-11-14 ENCOUNTER — Other Ambulatory Visit: Payer: Self-pay

## 2018-11-14 VITALS — BP 158/75 | HR 72 | Temp 97.5°F | Ht 67.0 in | Wt 124.6 lb

## 2018-11-14 DIAGNOSIS — M7918 Myalgia, other site: Secondary | ICD-10-CM | POA: Diagnosis not present

## 2018-11-14 DIAGNOSIS — M961 Postlaminectomy syndrome, not elsewhere classified: Secondary | ICD-10-CM | POA: Diagnosis not present

## 2018-11-14 DIAGNOSIS — M5412 Radiculopathy, cervical region: Secondary | ICD-10-CM

## 2018-11-14 DIAGNOSIS — G8929 Other chronic pain: Secondary | ICD-10-CM | POA: Diagnosis not present

## 2018-11-14 DIAGNOSIS — M546 Pain in thoracic spine: Secondary | ICD-10-CM

## 2018-11-14 DIAGNOSIS — M5416 Radiculopathy, lumbar region: Secondary | ICD-10-CM | POA: Insufficient documentation

## 2018-11-14 DIAGNOSIS — Z5181 Encounter for therapeutic drug level monitoring: Secondary | ICD-10-CM | POA: Diagnosis not present

## 2018-11-14 DIAGNOSIS — M542 Cervicalgia: Secondary | ICD-10-CM | POA: Insufficient documentation

## 2018-11-14 DIAGNOSIS — M545 Low back pain, unspecified: Secondary | ICD-10-CM

## 2018-11-14 DIAGNOSIS — Z9889 Other specified postprocedural states: Secondary | ICD-10-CM | POA: Insufficient documentation

## 2018-11-14 DIAGNOSIS — Z79899 Other long term (current) drug therapy: Secondary | ICD-10-CM | POA: Diagnosis not present

## 2018-11-14 DIAGNOSIS — G894 Chronic pain syndrome: Secondary | ICD-10-CM

## 2018-11-14 MED ORDER — OXYCODONE HCL ER 20 MG PO T12A: 20.0000 mg | EXTENDED_RELEASE_TABLET | Freq: Two times a day (BID) | ORAL | 0 refills | Status: DC

## 2018-11-14 MED ORDER — OXYCODONE-ACETAMINOPHEN 7.5-325 MG PO TABS: 1.0000 | ORAL_TABLET | ORAL | 0 refills | Status: DC | PRN

## 2018-11-14 NOTE — Progress Notes (Signed)
Subjective:    Patient ID: Bruce Olson, male    DOB: Mar 23, 1955, 63 y.o.   MRN: [id number]  HPI: Bruce Olson is a 63 y.o. male who returns for follow up appointment for chronic pain and medication refill. He states his pain is located in his neck radiating into his right shoulder and mid- lower back pain. He rates his pain 7. His current exercise regime is walking.  Bruce Olson Morphine equivalent is 105.00 MME.  Last Oral Swab was Performed on 09/13/2018, it was consistent.   Pain Inventory Average Pain 7 Pain Right Now 7 My pain is sharp, stabbing and tingling  In the last 24 hours, has pain interfered with the following? General activity 7 Relation with others 7 Enjoyment of life 7 What TIME of day is your pain at its worst? all Sleep (in general) Poor  Pain is worse with: walking, sitting and standing Pain improves with: heat/ice, medication and injections Relief from Meds: 3  Mobility walk without assistance use a cane how many minutes can you walk? 1/4 mile ability to climb steps?  yes do you drive?  yes  Function disabled: date disabled 05/2010  Neuro/Psych weakness numbness tremor trouble walking  Prior Studies Any changes since last visit?  no x-rays CT/MRI  Physicians involved in your care Any changes since last visit?  no Neurosurgeon Dr. Sherley Bounds   Family History  Problem Relation Age of Onset  . Anesthesia problems Neg Hx   . Hypotension Neg Hx   . Malignant hyperthermia Neg Hx   . Pseudochol deficiency Neg Hx    Social History   Socioeconomic History  . Marital status: Single    Spouse name: Not on file  . Number of children: Not on file  . Years of education: Not on file  . Highest education level: Not on file  Occupational History  . Not on file  Social Needs  . Financial resource strain: Not on file  . Food insecurity    Worry: Not on file    Inability: Not on file  . Transportation needs    Medical: Not on  file    Non-medical: Not on file  Tobacco Use  . Smoking status: Current Every Day Smoker    Packs/day: 1.00    Years: 33.00    Pack years: 33.00    Types: Cigarettes  . Smokeless tobacco: Never Used  Substance and Sexual Activity  . Alcohol use: No  . Drug use: No  . Sexual activity: Never    Comment: back problems  Lifestyle  . Physical activity    Days per week: Not on file    Minutes per session: Not on file  . Stress: Not on file  Relationships  . Social Herbalist on phone: Not on file    Gets together: Not on file    Attends religious service: Not on file    Active member of club or organization: Not on file    Attends meetings of clubs or organizations: Not on file    Relationship status: Not on file  Other Topics Concern  . Not on file  Social History Narrative  . Not on file   Past Surgical History:  Procedure Laterality Date  . BACK SURGERY  2013   Back surgery  (fusion)09/15/2010 & 2013  . BACK SURGERY  03-2013  . HARDWARE REMOVAL N/A 03/14/2013   Procedure: HARDWARE REMOVAL;  Surgeon: Eustace Moore, MD;  Location:  [facility];  Service: Neurosurgery;  Laterality: N/A;   Past Medical History:  Diagnosis Date  . Goals of care, counseling/discussion 04/07/2016  . Iron deficiency anemia due to chronic blood loss 10/21/2016  . 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 No pertinent past medical history   . Occupational injury 2013   resulted in back injury that led to surgery  . Thyroid cancer ([facility])    BP (!) 158/75   Pulse 72   Temp (!) 97.5 F (36.4 C)   Ht 5\' 7"  (1.702 m)   Wt 124 lb 9.6 oz (56.5 kg)   SpO2 95%   BMI 19.52 kg/m   Opioid Risk Score:   Fall Risk Score:  `1  Depression screen PHQ 2/9  Depression screen [facility] 2/9 11/14/2018 02/16/2018 12/22/2017 10/24/2017 06/22/2017 04/20/2017 03/20/2017  Decreased Interest 0 0 0 0 0 0 0  Down, Depressed, Hopeless 0 0 0 0 0 0 0  PHQ - 2 Score 0 0 0 0 0 0 0  Altered sleeping - - - - - - -  Tired,  decreased energy - - - - - - -  Change in appetite - - - - - - -  Feeling bad or failure about yourself  - - - - - - -  Trouble concentrating - - - - - - -  Moving slowly or fidgety/restless - - - - - - -  Suicidal thoughts - - - - - - -  PHQ-9 Score - - - - - - -    Review of Systems  Musculoskeletal: Positive for gait problem.  Neurological: Positive for tremors, weakness and numbness.  All other systems reviewed and are negative.      Objective:   Physical Exam Vitals signs and nursing note reviewed.  Constitutional:      Appearance: Normal appearance.  Neck:     Musculoskeletal: Normal range of motion and neck supple.  Cardiovascular:     Rate and Rhythm: Normal rate and regular rhythm.     Pulses: Normal pulses.     Heart sounds: Normal heart sounds.  Pulmonary:     Effort: Pulmonary effort is normal.     Breath sounds: Normal breath sounds.  Musculoskeletal:     Comments: Normal Muscle Bulk and Muscle Testing Reveals: Upper Extremities:Decreased  ROM 90 Degrees  and Muscle Strength 5/5  Thoracic Paraspinal Tenderness: T-1-T-2 T-7-T-9 Mainly Right Side Lumbar Hypersensitivity Lower Extremities: Full ROM and Muscle Strength 5/5 Arises from Table slowly Narrow Based Gait   Skin:    General: Skin is warm and dry.  Neurological:     Mental Status: He is alert and oriented to person, place, and time.  Psychiatric:        Mood and Affect: Mood normal.        Behavior: Behavior normal.           Assessment & Plan:  1. Lumbar postlaminectomy syndrome status post lumbar fusion x3: His most recent surgery was 03/14/2013. He had hardware removal and bone allograft waist and L2-3. Continue to Monitor. Continue current Medication Regimen.11/14/2018 Refilled: oxyCODONE 7.5/325mg  one tablet every 4 hours as needed #100 [id number] mg one tablet every 12 hours #60.  We will continue the opioid monitoring program, this consists of regular clinic visits, examinations,  urine drug screen, pill counts as well as use of [place] Controlled Substance Reporting System. 2. Lumbar Radiculopathy: Continuecurrent medication regimen withLyrica.11/14/2018 3. Insomnia: Continuecurrent medication regimen withTrazodone.11/14/2018 4. Nausea: Continue Zofran  as needed.Oncology Following.11/14/2018 5. Cervical Spondylosis/ Cervicalgia/ Cervical Radiculitis: Continuecurrent medication regimen withLyrica. 11/14/2018. 6. Cervical Myofascial Pain Syndrome: Continue current treatment regimen. Continue to monitor.11/14/2018  16minutes of face to face patient care time was spent during this visit. All questions were encouraged and answered.  F/U in 1 month

## 2018-11-15 ENCOUNTER — Encounter: Payer: Self-pay | Admitting: Registered Nurse

## 2018-12-18 ENCOUNTER — Encounter: Payer: Self-pay | Admitting: Registered Nurse

## 2018-12-18 ENCOUNTER — Encounter: Payer: Medicare Other | Attending: Physical Medicine and Rehabilitation | Admitting: Registered Nurse

## 2018-12-18 ENCOUNTER — Other Ambulatory Visit: Payer: Self-pay

## 2018-12-18 VITALS — BP 158/78 | HR 77 | Temp 97.7°F | Ht 66.0 in | Wt 123.0 lb

## 2018-12-18 DIAGNOSIS — G894 Chronic pain syndrome: Secondary | ICD-10-CM | POA: Diagnosis not present

## 2018-12-18 DIAGNOSIS — G8929 Other chronic pain: Secondary | ICD-10-CM

## 2018-12-18 DIAGNOSIS — M961 Postlaminectomy syndrome, not elsewhere classified: Secondary | ICD-10-CM | POA: Insufficient documentation

## 2018-12-18 DIAGNOSIS — M5416 Radiculopathy, lumbar region: Secondary | ICD-10-CM | POA: Insufficient documentation

## 2018-12-18 DIAGNOSIS — M5412 Radiculopathy, cervical region: Secondary | ICD-10-CM

## 2018-12-18 DIAGNOSIS — M7918 Myalgia, other site: Secondary | ICD-10-CM

## 2018-12-18 DIAGNOSIS — M546 Pain in thoracic spine: Secondary | ICD-10-CM

## 2018-12-18 DIAGNOSIS — Z5181 Encounter for therapeutic drug level monitoring: Secondary | ICD-10-CM | POA: Insufficient documentation

## 2018-12-18 DIAGNOSIS — M542 Cervicalgia: Secondary | ICD-10-CM | POA: Diagnosis not present

## 2018-12-18 DIAGNOSIS — Z9889 Other specified postprocedural states: Secondary | ICD-10-CM | POA: Diagnosis not present

## 2018-12-18 DIAGNOSIS — Z79899 Other long term (current) drug therapy: Secondary | ICD-10-CM | POA: Diagnosis not present

## 2018-12-18 MED ORDER — OXYCODONE-ACETAMINOPHEN 7.5-325 MG PO TABS: 1.0000 | ORAL_TABLET | ORAL | 0 refills | Status: DC | PRN

## 2018-12-18 MED ORDER — OXYCODONE HCL ER 20 MG PO T12A: 20.0000 mg | EXTENDED_RELEASE_TABLET | Freq: Two times a day (BID) | ORAL | 0 refills | Status: DC

## 2018-12-18 NOTE — Progress Notes (Signed)
Subjective:    Patient ID: Bruce Olson, male    DOB: 07-07-1955, 63 y.o.   MRN: [id number]  HPI: LARWENCE Olson is a 63 y.o. male who returns for follow up appointment for chronic pain and medication refill. He states his pain is located in his neck radiating into his right shoulder and mid- lower back pain radiating into his right lower extremity. He rates his pain 7. His current exercise regime is walking and performing stretching exercises.  Ms. Kutcher Morphine equivalent is 97.50 MME.  Last Oral Swab was Performed on 09/13/2018, it was consistent.    Pain Inventory Average Pain 7 Pain Right Now 7 My pain is sharp, stabbing and tingling  In the last 24 hours, has pain interfered with the following? General activity 7 Relation with others 7 Enjoyment of life 7 What TIME of day is your pain at its worst? all Sleep (in general) Poor  Pain is worse with: walking, sitting and standing Pain improves with: heat/ice, medication and TENS Relief from Meds: 3  Mobility walk without assistance use a cane ability to climb steps?  yes do you drive?  yes  Function disabled: date disabled .  Neuro/Psych weakness numbness tremor trouble walking  Prior Studies Any changes since last visit?  no  Physicians involved in your care Any changes since last visit?  no   Family History  Problem Relation Age of Onset  . Anesthesia problems Neg Hx   . Hypotension Neg Hx   . Malignant hyperthermia Neg Hx   . Pseudochol deficiency Neg Hx    Social History   Socioeconomic History  . Marital status: Single    Spouse name: Not on file  . Number of children: Not on file  . Years of education: Not on file  . Highest education level: Not on file  Occupational History  . Not on file  Social Needs  . Financial resource strain: Not on file  . Food insecurity    Worry: Not on file    Inability: Not on file  . Transportation needs    Medical: Not on file   Non-medical: Not on file  Tobacco Use  . Smoking status: Current Every Day Smoker    Packs/day: 1.00    Years: 33.00    Pack years: 33.00    Types: Cigarettes  . Smokeless tobacco: Never Used  Substance and Sexual Activity  . Alcohol use: No  . Drug use: No  . Sexual activity: Never    Comment: back problems  Lifestyle  . Physical activity    Days per week: Not on file    Minutes per session: Not on file  . Stress: Not on file  Relationships  . Social Herbalist on phone: Not on file    Gets together: Not on file    Attends religious service: Not on file    Active member of club or organization: Not on file    Attends meetings of clubs or organizations: Not on file    Relationship status: Not on file  Other Topics Concern  . Not on file  Social History Narrative  . Not on file   Past Surgical History:  Procedure Laterality Date  . BACK SURGERY  2013   Back surgery  (fusion)09/15/2010 & 2013  . BACK SURGERY  03-2013  . HARDWARE REMOVAL N/A 03/14/2013   Procedure: HARDWARE REMOVAL;  Surgeon: Eustace Moore, MD;  Location: [facility];  Service:  Neurosurgery;  Laterality: N/A;   Past Medical History:  Diagnosis Date  . Goals of care, counseling/discussion 04/07/2016  . Iron deficiency anemia due to chronic blood loss 10/21/2016  . 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 No pertinent past medical history   . Occupational injury 2013   resulted in back injury that led to surgery  . Thyroid cancer ([place])    There were no vitals taken for this visit.  Opioid Risk Score:   Fall Risk Score:  `1  Depression screen PHQ 2/9  Depression screen [facility] 2/9 11/14/2018 02/16/2018 12/22/2017 10/24/2017 06/22/2017 04/20/2017 03/20/2017  Decreased Interest 0 0 0 0 0 0 0  Down, Depressed, Hopeless 0 0 0 0 0 0 0  PHQ - 2 Score 0 0 0 0 0 0 0  Altered sleeping - - - - - - -  Tired, decreased energy - - - - - - -  Change in appetite - - - - - - -  Feeling bad or failure about yourself  - - -  - - - -  Trouble concentrating - - - - - - -  Moving slowly or fidgety/restless - - - - - - -  Suicidal thoughts - - - - - - -  PHQ-9 Score - - - - - - -    Review of Systems  Constitutional: Negative.   HENT: Negative.   Eyes: Negative.   Respiratory: Negative.   Cardiovascular: Negative.   Gastrointestinal: Negative.   Endocrine: Negative.   Genitourinary: Negative.   Musculoskeletal: Positive for arthralgias, back pain and myalgias.  Skin: Negative.   Allergic/Immunologic: Negative.   Neurological: Negative.   Hematological: Negative.   Psychiatric/Behavioral: Negative.   All other systems reviewed and are negative.      Objective:   Physical Exam Vitals signs and nursing note reviewed.  Constitutional:      Appearance: Normal appearance.  Neck:     Musculoskeletal: Normal range of motion and neck supple.     Comments: Cervical Paraspinal Tenderness: C-5-C-6 Cardiovascular:     Rate and Rhythm: Normal rate and regular rhythm.     Pulses: Normal pulses.     Heart sounds: Normal heart sounds.  Pulmonary:     Effort: Pulmonary effort is normal.     Breath sounds: Normal breath sounds.  Musculoskeletal:     Comments: Normal Muscle Bulk and Muscle Testing Reveals:  Upper Extremities: Full ROM and Muscle Strength 5/5 Right AC Joint Tenderness  Thoracic Hypersensitivity Lumbar Paraspinal Tenderness: L-3-L-5 Lower Extremities: Full ROM and Muscle Strength 5/5 Arises from Table slowly  Narrow Based  Gait   Skin:    General: Skin is warm and dry.  Neurological:     Mental Status: He is alert and oriented to person, place, and time.  Psychiatric:        Mood and Affect: Mood normal.        Behavior: Behavior normal.           Assessment & Plan:  1. Lumbar postlaminectomy syndrome status post lumbar fusion x3: His most recent surgery was 03/14/2013. He had hardware removal and bone allograft waist and L2-3. Continue to Monitor. Continue current Medication  Regimen.12/18/2018 Refilled: oxyCODONE 7.5/325mg  one tablet every 4 hours as needed #100 [id number] mg one tablet every 12 hours #60.  We will continue the opioid monitoring program, this consists of regular clinic visits, examinations, urine drug screen, pill counts as well as use of [place] Controlled Substance Reporting System.  2. Lumbar Radiculopathy: Continuecurrent medication regimen withLyrica.12/18/2018 3. Insomnia: Continuecurrent medication regimen withTrazodone.12/18/2018 4. Nausea: Continue Zofran as needed.Oncology Following.12/18/2018 5. Cervical Spondylosis/ Cervicalgia/ Cervical Radiculitis: Continuecurrent medication regimen withLyrica. 12/18/2018. 6. Cervical Myofascial Pain Syndrome: Continue current treatment regimen. Continue to monitor.12/18/2018  25minutes of face to face patient care time was spent during this visit. All questions were encouraged and answered.  F/U in 1 month

## 2018-12-21 ENCOUNTER — Other Ambulatory Visit: Payer: Self-pay | Admitting: Registered Nurse

## 2019-01-04 ENCOUNTER — Other Ambulatory Visit: Payer: Self-pay | Admitting: *Deleted

## 2019-01-04 MED ORDER — METOCLOPRAMIDE HCL 10 MG PO TABS: 10.0000 mg | ORAL_TABLET | Freq: Four times a day (QID) | ORAL | 6 refills | Status: DC

## 2019-01-05 ENCOUNTER — Other Ambulatory Visit: Payer: Self-pay | Admitting: Hematology & Oncology

## 2019-01-17 ENCOUNTER — Encounter: Payer: Self-pay | Admitting: Registered Nurse

## 2019-01-17 ENCOUNTER — Other Ambulatory Visit: Payer: Self-pay

## 2019-01-17 ENCOUNTER — Encounter: Payer: Medicare Other | Attending: Physical Medicine and Rehabilitation | Admitting: Registered Nurse

## 2019-01-17 VITALS — BP 152/83 | HR 74 | Temp 97.9°F | Ht 66.0 in | Wt 126.0 lb

## 2019-01-17 DIAGNOSIS — G8929 Other chronic pain: Secondary | ICD-10-CM

## 2019-01-17 DIAGNOSIS — Z79891 Long term (current) use of opiate analgesic: Secondary | ICD-10-CM | POA: Diagnosis not present

## 2019-01-17 DIAGNOSIS — M5416 Radiculopathy, lumbar region: Secondary | ICD-10-CM | POA: Diagnosis present

## 2019-01-17 DIAGNOSIS — M7918 Myalgia, other site: Secondary | ICD-10-CM

## 2019-01-17 DIAGNOSIS — G894 Chronic pain syndrome: Secondary | ICD-10-CM | POA: Diagnosis not present

## 2019-01-17 DIAGNOSIS — Z79899 Other long term (current) drug therapy: Secondary | ICD-10-CM | POA: Diagnosis present

## 2019-01-17 DIAGNOSIS — M542 Cervicalgia: Secondary | ICD-10-CM

## 2019-01-17 DIAGNOSIS — Z5181 Encounter for therapeutic drug level monitoring: Secondary | ICD-10-CM

## 2019-01-17 DIAGNOSIS — M5412 Radiculopathy, cervical region: Secondary | ICD-10-CM

## 2019-01-17 DIAGNOSIS — Z9889 Other specified postprocedural states: Secondary | ICD-10-CM

## 2019-01-17 DIAGNOSIS — M961 Postlaminectomy syndrome, not elsewhere classified: Secondary | ICD-10-CM | POA: Diagnosis not present

## 2019-01-17 DIAGNOSIS — M546 Pain in thoracic spine: Secondary | ICD-10-CM

## 2019-01-17 MED ORDER — PREGABALIN 100 MG PO CAPS: 100.0000 mg | ORAL_CAPSULE | Freq: Three times a day (TID) | ORAL | 5 refills | Status: DC

## 2019-01-17 MED ORDER — OXYCODONE HCL ER 20 MG PO T12A: 20.0000 mg | EXTENDED_RELEASE_TABLET | Freq: Two times a day (BID) | ORAL | 0 refills | Status: DC

## 2019-01-17 MED ORDER — OXYCODONE-ACETAMINOPHEN 7.5-325 MG PO TABS: 1.0000 | ORAL_TABLET | ORAL | 0 refills | Status: DC | PRN

## 2019-01-17 NOTE — Progress Notes (Signed)
Subjective:    Patient ID: Bruce Olson, male    DOB: 11-05-1955, 63 y.o.   MRN: [id number]  HPI: Bruce Olson is a 63 y.o. male who returns for follow up appointment for chronic pain and medication refill. He states his pain is located in his neck ( mainly right side) radiating into his right shoulder and upper back mainly right side. Also reports lower back pain radiating into his bilateral lower extremities. He rates his pain 7. His current exercise regime is walking and performing stretching exercises.  Mr. Wenzinger Morphine equivalent is 105.00  MME.  Last Oral Swab was Performed on 09/13/2018, it was consistent.    Pain Inventory Average Pain 7 Pain Right Now 7 My pain is sharp, stabbing and tingling  In the last 24 hours, has pain interfered with the following? General activity 7 Relation with others 7 Enjoyment of life 7 What TIME of day is your pain at its worst? all Sleep (in general) Poor  Pain is worse with: walking, sitting and standing Pain improves with: heat/ice, medication and TENS Relief from Meds: 3  Mobility walk without assistance use a cane how many minutes can you walk? 1/4 mile ability to climb steps?  yes do you drive?  yes  Function disabled: date disabled 05/2010  Neuro/Psych weakness numbness tremor trouble walking  Prior Studies Any changes since last visit?  no x-rays CT/MRI  Physicians involved in your care Any changes since last visit?  no Neurosurgeon .   Family History  Problem Relation Age of Onset  . Anesthesia problems Neg Hx   . Hypotension Neg Hx   . Malignant hyperthermia Neg Hx   . Pseudochol deficiency Neg Hx    Social History   Socioeconomic History  . Marital status: Single    Spouse name: Not on file  . Number of children: Not on file  . Years of education: Not on file  . Highest education level: Not on file  Occupational History  . Not on file  Tobacco Use  . Smoking status: Current Every  Day Smoker    Packs/day: 1.00    Years: 33.00    Pack years: 33.00    Types: Cigarettes  . Smokeless tobacco: Never Used  Substance and Sexual Activity  . Alcohol use: No  . Drug use: No  . Sexual activity: Never    Comment: back problems  Other Topics Concern  . Not on file  Social History Narrative  . Not on file   Social Determinants of Health   Financial Resource Strain:   . Difficulty of Paying Living Expenses: Not on file  Food Insecurity:   . Worried About Charity fundraiser in the Last Year: Not on file  . Ran Out of Food in the Last Year: Not on file  Transportation Needs:   . Lack of Transportation (Medical): Not on file  . Lack of Transportation (Non-Medical): Not on file  Physical Activity:   . Days of Exercise per Week: Not on file  . Minutes of Exercise per Session: Not on file  Stress:   . Feeling of Stress : Not on file  Social Connections:   . Frequency of Communication with Friends and Family: Not on file  . Frequency of Social Gatherings with Friends and Family: Not on file  . Attends Religious Services: Not on file  . Active Member of Clubs or Organizations: Not on file  . Attends Archivist Meetings: Not on  file  . Marital Status: Not on file   Past Surgical History:  Procedure Laterality Date  . BACK SURGERY  2013   Back surgery  (fusion)09/15/2010 & 2013  . BACK SURGERY  03-2013  . HARDWARE REMOVAL N/A 03/14/2013   Procedure: HARDWARE REMOVAL;  Surgeon: Eustace Moore, MD;  Location: [place] [facility];  Service: Neurosurgery;  Laterality: N/A;   Past Medical History:  Diagnosis Date  . Goals of care, counseling/discussion 04/07/2016  . Iron deficiency anemia due to chronic blood loss 10/21/2016  . 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 No pertinent past medical history   . Occupational injury 2013   resulted in back injury that led to surgery  . Thyroid cancer (HCC)    BP (!) 152/83   Pulse 74   Temp 97.9 F (36.6 C)   Ht 5\' 6"  (1.676 m)    Wt 126 lb (57.2 kg)   SpO2 95%   BMI 20.34 kg/m   Opioid Risk Score:   Fall Risk Score:  `1  Depression screen PHQ 2/9  Depression screen [facility] 2/9 11/14/2018 02/16/2018 12/22/2017 10/24/2017 06/22/2017 04/20/2017 03/20/2017  Decreased Interest 0 0 0 0 0 0 0  Down, Depressed, Hopeless 0 0 0 0 0 0 0  PHQ - 2 Score 0 0 0 0 0 0 0  Altered sleeping - - - - - - -  Tired, decreased energy - - - - - - -  Change in appetite - - - - - - -  Feeling bad or failure about yourself  - - - - - - -  Trouble concentrating - - - - - - -  Moving slowly or fidgety/restless - - - - - - -  Suicidal thoughts - - - - - - -  PHQ-9 Score - - - - - - -     Review of Systems  Musculoskeletal: Positive for gait problem.  Neurological: Positive for weakness and numbness.  All other systems reviewed and are negative.      Objective:   Physical Exam Vitals and nursing note reviewed.  Constitutional:      Appearance: Normal appearance.  Neck:     Comments: Cervical Paraspinal Tenderness: C-5-C-6 Mainly Right Side Cardiovascular:     Rate and Rhythm: Normal rate and regular rhythm.     Pulses: Normal pulses.     Heart sounds: Normal heart sounds.  Pulmonary:     Breath sounds: Wheezing and rhonchi present.  Musculoskeletal:     Cervical back: Normal range of motion and neck supple.     Comments: Normal Muscle Bulk and Muscle Testing Reveals:  Upper Extremities: Full ROM and Muscle Strength 5/5 Thoracic Paraspinal Tenderness: T-1-T-3 Lumbar Hypersensitivity Lower Extremities: Full ROM and Muscle Strength 5/5 Arises from Table Slowly Narrow Based  Gait   Skin:    General: Skin is warm and dry.  Neurological:     Mental Status: He is alert and oriented to person, place, and time.  Psychiatric:        Mood and Affect: Mood normal.        Behavior: Behavior normal.           Assessment & Plan:  1. Lumbar postlaminectomy syndrome status post lumbar fusion x3: His most recent surgery was  03/14/2013. He had hardware removal and bone allograft waist and L2-3. Continue to Monitor. Continue current Medication Regimen.01/17/2019 Refilled: oxyCODONE 7.5/325mg  one tablet every 4 hours as needed #100 [id number] mg one tablet every 12  hours #60.  We will continue the opioid monitoring program, this consists of regular clinic visits, examinations, urine drug screen, pill counts as well as use of [place] Controlled Substance Reporting System. 2. Lumbar Radiculopathy: Continuecurrent medication regimen withLyrica.01/17/2019 3. Insomnia: Continuecurrent medication regimen withTrazodone.01/17/2019 4. Nausea: Continue Zofran as needed.Oncology Following.01/17/2019 5. Cervical Spondylosis/ Cervicalgia/ Cervical Radiculitis: Continuecurrent medication regimen withLyrica.01/17/2019. 6. Cervical Myofascial Pain Syndrome: Continue current treatment regimen. Continue to monitor.01/17/2019  63minutes of face to face patient care time was spent during this visit. All questions were encouraged and answered.  F/U in 1 month

## 2019-01-20 LAB — DRUG TOX MONITOR 1 W/CONF, ORAL FLD
Alprazolam: NEGATIVE ng/mL (ref <0.50)
Amphetamines: NEGATIVE ng/mL (ref <10)
Barbiturates: NEGATIVE ng/mL (ref <10)
Benzodiazepines: NEGATIVE ng/mL (ref <0.50)
Buprenorphine: NEGATIVE ng/mL (ref <0.10)
Clonazepam: NEGATIVE ng/mL (ref <0.50)
Cocaine: NEGATIVE ng/mL (ref <5.0)
Codeine: NEGATIVE ng/mL (ref <2.5)
Cotinine: 242.6 ng/mL — ABNORMAL HIGH (ref <5.0)
Diazepam: NEGATIVE ng/mL (ref <0.50)
Dihydrocodeine: NEGATIVE ng/mL (ref <2.5)
Fentanyl: NEGATIVE ng/mL (ref <0.10)
Flunitrazepam: NEGATIVE ng/mL (ref <0.50)
Flurazepam: NEGATIVE ng/mL (ref <0.50)
Heroin Metabolite: NEGATIVE ng/mL (ref <1.0)
Hydrocodone: NEGATIVE ng/mL (ref <2.5)
Hydromorphone: NEGATIVE ng/mL (ref <2.5)
Lorazepam: NEGATIVE ng/mL (ref <0.50)
MARIJUANA: NEGATIVE ng/mL (ref <2.5)
MDMA: NEGATIVE ng/mL (ref <10)
Meprobamate: NEGATIVE ng/mL (ref <2.5)
Methadone: NEGATIVE ng/mL (ref <5.0)
Midazolam: NEGATIVE ng/mL (ref <0.50)
Morphine: NEGATIVE ng/mL (ref <2.5)
Nicotine Metabolite: POSITIVE ng/mL — AB (ref <5.0)
Nordiazepam: NEGATIVE ng/mL (ref <0.50)
Norhydrocodone: NEGATIVE ng/mL (ref <2.5)
Noroxycodone: 93.2 ng/mL — ABNORMAL HIGH (ref <2.5)
Opiates: POSITIVE ng/mL — AB (ref <2.5)
Oxazepam: NEGATIVE ng/mL (ref <0.50)
Oxycodone: >250 ng/mL — ABNORMAL HIGH (ref <2.5)
Oxymorphone: 2.8 ng/mL — ABNORMAL HIGH (ref <2.5)
Phencyclidine: NEGATIVE ng/mL (ref <10)
Tapentadol: NEGATIVE ng/mL (ref <5.0)
Temazepam: NEGATIVE ng/mL (ref <0.50)
Tramadol: NEGATIVE ng/mL (ref <5.0)
Triazolam: NEGATIVE ng/mL (ref <0.50)
Zolpidem: NEGATIVE ng/mL (ref <5.0)

## 2019-01-20 LAB — DRUG TOX ALC METAB W/CON, ORAL FLD: Alcohol Metabolite: NEGATIVE ng/mL (ref <25)

## 2019-02-04 ENCOUNTER — Other Ambulatory Visit: Payer: Self-pay | Admitting: *Deleted

## 2019-02-04 MED ORDER — OLANZAPINE 10 MG PO TABS: 10.0000 mg | ORAL_TABLET | Freq: Every day | ORAL | 3 refills | Status: DC

## 2019-02-14 ENCOUNTER — Encounter: Payer: Medicare Other | Attending: Physical Medicine and Rehabilitation | Admitting: Registered Nurse

## 2019-02-14 ENCOUNTER — Other Ambulatory Visit: Payer: Self-pay

## 2019-02-14 ENCOUNTER — Other Ambulatory Visit: Payer: Self-pay | Admitting: Registered Nurse

## 2019-02-14 ENCOUNTER — Encounter: Payer: Self-pay | Admitting: Registered Nurse

## 2019-02-14 VITALS — BP 138/78 | HR 76 | Temp 97.7°F | Resp 14 | Ht 66.0 in | Wt 124.0 lb

## 2019-02-14 DIAGNOSIS — Z9889 Other specified postprocedural states: Secondary | ICD-10-CM | POA: Insufficient documentation

## 2019-02-14 DIAGNOSIS — M546 Pain in thoracic spine: Secondary | ICD-10-CM | POA: Diagnosis not present

## 2019-02-14 DIAGNOSIS — M7918 Myalgia, other site: Secondary | ICD-10-CM | POA: Diagnosis not present

## 2019-02-14 DIAGNOSIS — M542 Cervicalgia: Secondary | ICD-10-CM | POA: Insufficient documentation

## 2019-02-14 DIAGNOSIS — M5416 Radiculopathy, lumbar region: Secondary | ICD-10-CM | POA: Insufficient documentation

## 2019-02-14 DIAGNOSIS — Z79899 Other long term (current) drug therapy: Secondary | ICD-10-CM | POA: Diagnosis not present

## 2019-02-14 DIAGNOSIS — Z5181 Encounter for therapeutic drug level monitoring: Secondary | ICD-10-CM | POA: Insufficient documentation

## 2019-02-14 DIAGNOSIS — G8929 Other chronic pain: Secondary | ICD-10-CM | POA: Diagnosis not present

## 2019-02-14 DIAGNOSIS — G894 Chronic pain syndrome: Secondary | ICD-10-CM | POA: Insufficient documentation

## 2019-02-14 DIAGNOSIS — M961 Postlaminectomy syndrome, not elsewhere classified: Secondary | ICD-10-CM | POA: Diagnosis not present

## 2019-02-14 DIAGNOSIS — M5412 Radiculopathy, cervical region: Secondary | ICD-10-CM

## 2019-02-14 DIAGNOSIS — Z79891 Long term (current) use of opiate analgesic: Secondary | ICD-10-CM | POA: Insufficient documentation

## 2019-02-14 MED ORDER — OXYCODONE HCL ER 20 MG PO T12A: 20.0000 mg | EXTENDED_RELEASE_TABLET | Freq: Two times a day (BID) | ORAL | 0 refills | Status: DC

## 2019-02-14 MED ORDER — OXYCODONE-ACETAMINOPHEN 7.5-325 MG PO TABS: 1.0000 | ORAL_TABLET | ORAL | 0 refills | Status: DC | PRN

## 2019-02-14 NOTE — Progress Notes (Signed)
Subjective:    Patient ID: Bruce Olson, male    DOB: March 31, 1955, 64 y.o.   MRN: [id number]  HPI: Bruce Olson is a 64 y.o. male who returns for follow up appointment for chronic pain and medication refill. He states his pain is located in his neck radiating into his right shoulder and mid- back pain. He rates his pain 7. His current exercise regime is walking and performing stretching exercises.  Mr. Gwyn Morphine equivalent is 105.00  MME.  Last Oral Swab was Performed on 09/13/2018, it was consistent.   Pain Inventory Average Pain 7 Pain Right Now 7 My pain is sharp, stabbing and tingling  In the last 24 hours, has pain interfered with the following? General activity 7 Relation with others 7 Enjoyment of life 7 What TIME of day is your pain at its worst? all Sleep (in general) Poor  Pain is worse with: walking, sitting and standing Pain improves with: heat/ice, medication and TENS Relief from Meds: 3  Mobility walk without assistance ability to climb steps?  yes do you drive?  yes  Function disabled: date disabled .  Neuro/Psych weakness numbness tremor trouble walking  Prior Studies Any changes since last visit?  no  Physicians involved in your care Any changes since last visit?  no   Family History  Problem Relation Age of Onset  . Anesthesia problems Neg Hx   . Hypotension Neg Hx   . Malignant hyperthermia Neg Hx   . Pseudochol deficiency Neg Hx    Social History   Socioeconomic History  . Marital status: Single    Spouse name: Not on file  . Number of children: Not on file  . Years of education: Not on file  . Highest education level: Not on file  Occupational History  . Not on file  Tobacco Use  . Smoking status: Current Every Day Smoker    Packs/day: 1.00    Years: 33.00    Pack years: 33.00    Types: Cigarettes  . Smokeless tobacco: Never Used  Substance and Sexual Activity  . Alcohol use: No  . Drug use: No  .  Sexual activity: Never    Comment: back problems  Other Topics Concern  . Not on file  Social History Narrative  . Not on file   Social Determinants of Health   Financial Resource Strain:   . Difficulty of Paying Living Expenses: Not on file  Food Insecurity:   . Worried About Charity fundraiser in the Last Year: Not on file  . Ran Out of Food in the Last Year: Not on file  Transportation Needs:   . Lack of Transportation (Medical): Not on file  . Lack of Transportation (Non-Medical): Not on file  Physical Activity:   . Days of Exercise per Week: Not on file  . Minutes of Exercise per Session: Not on file  Stress:   . Feeling of Stress : Not on file  Social Connections:   . Frequency of Communication with Friends and Family: Not on file  . Frequency of Social Gatherings with Friends and Family: Not on file  . Attends Religious Services: Not on file  . Active Member of Clubs or Organizations: Not on file  . Attends Archivist Meetings: Not on file  . Marital Status: Not on file   Past Surgical History:  Procedure Laterality Date  . BACK SURGERY  2013   Back surgery  (fusion)09/15/2010 & 2013  .  BACK SURGERY  03-2013  . HARDWARE REMOVAL N/A 03/14/2013   Procedure: HARDWARE REMOVAL;  Surgeon: Eustace Moore, MD;  Location: [place] [facility];  Service: Neurosurgery;  Laterality: N/A;   Past Medical History:  Diagnosis Date  . Goals of care, counseling/discussion 04/07/2016  . Iron deficiency anemia due to chronic blood loss 10/21/2016  . 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 No pertinent past medical history   . Occupational injury 2013   resulted in back injury that led to surgery  . Thyroid cancer (HCC)    BP 138/78   Pulse 76   Temp 97.7 F (36.5 C)   Resp 14   Ht 5\' 6"  (1.676 m)   Wt 124 lb (56.2 kg)   SpO2 92%   BMI 20.01 kg/m   Opioid Risk Score:   Fall Risk Score:  `1  Depression screen PHQ 2/9  Depression screen [facility] 2/9 11/14/2018 02/16/2018 12/22/2017  10/24/2017 06/22/2017 04/20/2017 03/20/2017  Decreased Interest 0 0 0 0 0 0 0  Down, Depressed, Hopeless 0 0 0 0 0 0 0  PHQ - 2 Score 0 0 0 0 0 0 0  Altered sleeping - - - - - - -  Tired, decreased energy - - - - - - -  Change in appetite - - - - - - -  Feeling bad or failure about yourself  - - - - - - -  Trouble concentrating - - - - - - -  Moving slowly or fidgety/restless - - - - - - -  Suicidal thoughts - - - - - - -  PHQ-9 Score - - - - - - -    Review of Systems  Constitutional: Negative.   HENT: Negative.   Eyes: Negative.   Respiratory: Negative.   Cardiovascular: Negative.   Gastrointestinal: Negative.   Endocrine: Negative.   Genitourinary: Negative.   Musculoskeletal: Positive for back pain.  Skin: Negative.   Allergic/Immunologic: Negative.   Neurological: Positive for tremors, weakness and numbness.  Hematological: Negative.   Psychiatric/Behavioral: Negative.   All other systems reviewed and are negative.      Objective:   Physical Exam Vitals and nursing note reviewed.  Constitutional:      Appearance: Normal appearance.  Neck:     Comments: Cervical Paraspinal Tenderness: C-5-C-6 Musculoskeletal:     Cervical back: Normal range of motion and neck supple.  Neurological:     Mental Status: He is alert.           Assessment & Plan:  1. Lumbar postlaminectomy syndrome status post lumbar fusion x3: His most recent surgery was 03/14/2013. He had hardware removal and bone allograft waist and L2-3. Continue to Monitor. Continue current Medication Regimen.02/14/2019 Refilled: oxyCODONE 7.5/325mg  one tablet every 4 hours as needed #100 [id number] mg one tablet every 12 hours #60.  We will continue the opioid monitoring program, this consists of regular clinic visits, examinations, urine drug screen, pill counts as well as use of [place] Controlled Substance Reporting System. 2. Lumbar Radiculopathy: Continuecurrent medication regimen  withLyrica.02/14/2019 3. Insomnia: Continuecurrent medication regimen withTrazodone.02/14/2019 4. Nausea: Continue Zofran as needed.Oncology Following.02/14/2019 5. Cervical Spondylosis/ Cervicalgia/ Cervical Radiculitis: Continuecurrent medication regimen withLyrica.02/14/2019 6. Cervical Myofascial Pain Syndrome: Continue current treatment regimen. Continue to monitor.02/14/2019.  27minutes of face to face patient care time was spent during this visit. All questions were encouraged and answered.  F/U in 1 month

## 2019-02-15 NOTE — Telephone Encounter (Signed)
Placed a call to [facility] spoke with pharmacist, he stated the message was sent in error.

## 2019-03-08 ENCOUNTER — Encounter: Payer: Self-pay | Admitting: Hematology & Oncology

## 2019-03-08 ENCOUNTER — Inpatient Hospital Stay: Payer: Medicare Other

## 2019-03-08 ENCOUNTER — Other Ambulatory Visit: Payer: Self-pay

## 2019-03-08 ENCOUNTER — Inpatient Hospital Stay: Payer: Medicare Other | Attending: Hematology & Oncology | Admitting: Hematology & Oncology

## 2019-03-08 VITALS — BP 163/64 | HR 71 | Temp 98.4°F | Resp 18 | Wt 126.0 lb

## 2019-03-08 DIAGNOSIS — E034 Atrophy of thyroid (acquired): Secondary | ICD-10-CM | POA: Insufficient documentation

## 2019-03-08 DIAGNOSIS — C090 Malignant neoplasm of tonsillar fossa: Secondary | ICD-10-CM

## 2019-03-08 DIAGNOSIS — Z923 Personal history of irradiation: Secondary | ICD-10-CM | POA: Insufficient documentation

## 2019-03-08 DIAGNOSIS — D500 Iron deficiency anemia secondary to blood loss (chronic): Secondary | ICD-10-CM | POA: Diagnosis not present

## 2019-03-08 DIAGNOSIS — Z9221 Personal history of antineoplastic chemotherapy: Secondary | ICD-10-CM | POA: Diagnosis not present

## 2019-03-08 LAB — CMP (CANCER CENTER ONLY)
ALT: 15 U/L (ref 0–44)
AST: 16 U/L (ref 15–41)
Albumin: 4.2 g/dL (ref 3.5–5.0)
Alkaline Phosphatase: 54 U/L (ref 38–126)
Anion gap: 6 (ref 5–15)
BUN: 16 mg/dL (ref 8–23)
CO2: 30 mmol/L (ref 22–32)
Calcium: 9.1 mg/dL (ref 8.9–10.3)
Chloride: 100 mmol/L (ref 98–111)
Creatinine: 1.05 mg/dL (ref 0.61–1.24)
GFR, Est AFR Am: >60 mL/min (ref >60)
GFR, Estimated: >60 mL/min (ref >60)
Glucose, Bld: 85 mg/dL (ref 70–99)
Potassium: 4.2 mmol/L (ref 3.5–5.1)
Sodium: 136 mmol/L (ref 135–145)
Total Bilirubin: 0.3 mg/dL (ref 0.3–1.2)
Total Protein: 6.7 g/dL (ref 6.5–8.1)

## 2019-03-08 LAB — CBC WITH DIFFERENTIAL (CANCER CENTER ONLY)
Abs Immature Granulocytes: 0.01 10*3/uL (ref 0.00–0.07)
Basophils Absolute: 0 10*3/uL (ref 0.0–0.1)
Basophils Relative: 1 %
Eosinophils Absolute: 0.1 10*3/uL (ref 0.0–0.5)
Eosinophils Relative: 2 %
HCT: 37.2 % — ABNORMAL LOW (ref 39.0–52.0)
Hemoglobin: 12.8 g/dL — ABNORMAL LOW (ref 13.0–17.0)
Immature Granulocytes: 0 %
Lymphocytes Relative: 22 %
Lymphs Abs: 1.3 10*3/uL (ref 0.7–4.0)
MCH: 30.8 pg (ref 26.0–34.0)
MCHC: 34.4 g/dL (ref 30.0–36.0)
MCV: 89.6 fL (ref 80.0–100.0)
Monocytes Absolute: 0.8 10*3/uL (ref 0.1–1.0)
Monocytes Relative: 14 %
Neutro Abs: 3.6 10*3/uL (ref 1.7–7.7)
Neutrophils Relative %: 61 %
Platelet Count: 217 10*3/uL (ref 150–400)
RBC: 4.15 MIL/uL — ABNORMAL LOW (ref 4.22–5.81)
RDW: 12.3 % (ref 11.5–15.5)
WBC Count: 5.9 10*3/uL (ref 4.0–10.5)
nRBC: 0 % (ref 0.0–0.2)

## 2019-03-08 NOTE — Progress Notes (Signed)
Hematology and Oncology Follow Up Visit  Bruce Olson [id number] 07-17-1955 64 y.o. 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 is here today for follow-up.  Overall, he really is about the same.  He still has the issues with vomiting.  So far, nothing really has helped with that.  He is gaining weight.  As such, whenever he is eating, he mostly absorbs.  We are watching his thyroid function.  Back in October, his TSH was 2.44.  He has had no problems with bowels or bladder.  He has had no obvious bleeding.  He has had no rashes.  Has had no leg swelling.  His mouth is still little on the dry side.  He does see a pain specialist.  He is on OxyContin and oxycodone.  Overall, his performance status is ECOG 1.     Medications:  Allergies as of 03/08/2019   No Known Allergies     Medication List       Accurate as of March 08, 2019  2:07 PM. If you have any questions, ask your nurse or doctor.        docusate sodium 100 MG capsule Commonly known as: COLACE Take 100 mg by mouth daily.   dronabinol 2.5 MG capsule Commonly known as: MARINOL Take 1 capsule (2.5 mg total) by mouth 2 (two) times daily before lunch and supper.   metoCLOPramide 10 MG tablet Commonly known as: REGLAN Take 1 tablet (10 mg total) by mouth 4 (four) times daily.   multivitamin with minerals Tabs tablet Take 1 tablet by mouth daily.   OLANZapine 10 MG tablet Commonly known as: ZYPREXA Take 1 tablet (10 mg total) by mouth at bedtime.   oxyCODONE 20 mg 12 hr tablet Commonly known as: OxyCONTIN Take 1 tablet (20 mg total) by mouth every 12 (twelve) hours.   oxyCODONE-acetaminophen 7.5-325 MG tablet Commonly known as: Percocet Take 1 tablet by mouth every 4 (four) hours as needed. May  take one extra tablet when pain is sever. No more than 4 a day   pregabalin 100 MG capsule Commonly known as: LYRICA Take 1 capsule (100 mg total) by mouth 3 (three) times daily.       Allergies: No Known Allergies  Past Medical History, Surgical history, Social history, and Family History were reviewed and updated.  Review of Systems: Review of Systems  Constitutional: Positive for malaise/fatigue.  HENT: Positive for sore throat.   Eyes: Negative.   Respiratory: Negative.   Cardiovascular: Negative.   Gastrointestinal: Negative.   Genitourinary: Positive for dysuria.  Musculoskeletal: Positive for joint pain and myalgias.  Skin: Negative.   Neurological: Positive for sensory change.  Endo/Heme/Allergies: Negative.   All other systems reviewed and are negative.   Physical Exam:  weight is 126 lb (57.2 kg). His temporal temperature is 98.4 F (36.9 C). His blood pressure is 163/64 (abnormal) and his pulse is 71. His respiration is 18 and oxygen saturation is 96%.   Wt Readings from Last 3 Encounters:  03/08/19 126 lb (57.2 kg)  02/14/19 124 lb (56.2 kg)  01/17/19 126 lb (57.2 kg)    Physical Exam Vitals reviewed.  HENT:     Head: Normocephalic and atraumatic.  Eyes:     Pupils: Pupils are equal, round, and reactive  to light.  Cardiovascular:     Rate and Rhythm: Normal rate and regular rhythm.     Heart sounds: Normal heart sounds.  Pulmonary:     Effort: Pulmonary effort is normal.     Breath sounds: Normal breath sounds.  Abdominal:     General: Bowel sounds are normal.     Palpations: Abdomen is soft.  Musculoskeletal:        General: No tenderness or deformity. Normal range of motion.     Cervical back: Normal range of motion.  Lymphadenopathy:     Cervical: No cervical adenopathy.  Skin:    General: Skin is warm and dry.     Findings: No erythema or rash.  Neurological:     Mental Status: He is alert and oriented to person, place, and time.    Psychiatric:        Behavior: Behavior normal.        Thought Content: Thought content normal.        Judgment: Judgment normal.      Lab Results  Component Value Date   WBC 5.9 03/08/2019   HGB 12.8 (L) 03/08/2019   HCT 37.2 (L) 03/08/2019   MCV 89.6 03/08/2019   PLT 217 03/08/2019   Lab Results  Component Value Date   FERRITIN 200 07/06/2018   IRON 34 (L) 07/06/2018   TIBC 281 07/06/2018   UIBC 247 07/06/2018   IRONPCTSAT 12 (L) 07/06/2018   Lab Results  Component Value Date   RBC 4.15 (L) 03/08/2019   No results found for: KPAFRELGTCHN, LAMBDASER, KAPLAMBRATIO No results found for: IGGSERUM, IGA, IGMSERUM No results found for: Odetta Pink, SPEI   Chemistry      Component Value Date/Time   NA 136 03/08/2019 1254   NA 138 01/20/2017 1324   NA 135 (L) 04/05/2016 1333   K 4.2 03/08/2019 1254   K 4.5 01/20/2017 1324   K 4.7 04/05/2016 1333   CL 100 03/08/2019 1254   CL 97 (L) 01/20/2017 1324   CO2 30 03/08/2019 1254   CO2 30 01/20/2017 1324   CO2 29 04/05/2016 1333   BUN 16 03/08/2019 1254   BUN 15 01/20/2017 1324   BUN 23.6 04/05/2016 1333   CREATININE 1.05 03/08/2019 1254   CREATININE 1.2 01/20/2017 1324   CREATININE 1.1 04/05/2016 1333      Component Value Date/Time   CALCIUM 9.1 03/08/2019 1254   CALCIUM 9.2 01/20/2017 1324   CALCIUM 9.2 04/05/2016 1333   ALKPHOS 54 03/08/2019 1254   ALKPHOS 51 01/20/2017 1324   ALKPHOS 67 04/05/2016 1333   AST 16 03/08/2019 1254   AST 17 04/05/2016 1333   ALT 15 03/08/2019 1254   ALT 26 01/20/2017 1324   ALT 10 04/05/2016 1333   BILITOT 0.3 03/08/2019 1254   BILITOT 0.30 04/05/2016 1333      Impression and Plan: [name] is a very pleasant 64 yo caucasian gentleman with history of squamous cell carcinoma of the left tonsil, HPV positive.  Again, I do not think he has any problems with recurrent disease.  I think that we will get him back in  another 4 months.  I typically like formal follow-ups with him.  We will continue to monitor his iron studies and his TSH.   [name], MD 2/5/20212:07 PM

## 2019-03-11 LAB — TSH: TSH: 2.821 u[IU]/mL (ref 0.320–4.118)

## 2019-03-15 ENCOUNTER — Encounter: Payer: Self-pay | Admitting: Registered Nurse

## 2019-03-15 ENCOUNTER — Other Ambulatory Visit: Payer: Self-pay

## 2019-03-15 ENCOUNTER — Encounter: Payer: Medicare Other | Attending: Physical Medicine and Rehabilitation | Admitting: Registered Nurse

## 2019-03-15 VITALS — BP 154/89 | HR 73 | Temp 97.5°F | Ht 66.0 in | Wt 128.0 lb

## 2019-03-15 DIAGNOSIS — M961 Postlaminectomy syndrome, not elsewhere classified: Secondary | ICD-10-CM | POA: Diagnosis not present

## 2019-03-15 DIAGNOSIS — M5412 Radiculopathy, cervical region: Secondary | ICD-10-CM | POA: Diagnosis not present

## 2019-03-15 DIAGNOSIS — G894 Chronic pain syndrome: Secondary | ICD-10-CM | POA: Diagnosis not present

## 2019-03-15 DIAGNOSIS — M7918 Myalgia, other site: Secondary | ICD-10-CM

## 2019-03-15 DIAGNOSIS — M546 Pain in thoracic spine: Secondary | ICD-10-CM

## 2019-03-15 DIAGNOSIS — Z5181 Encounter for therapeutic drug level monitoring: Secondary | ICD-10-CM | POA: Diagnosis not present

## 2019-03-15 DIAGNOSIS — Z9889 Other specified postprocedural states: Secondary | ICD-10-CM | POA: Diagnosis not present

## 2019-03-15 DIAGNOSIS — M542 Cervicalgia: Secondary | ICD-10-CM | POA: Insufficient documentation

## 2019-03-15 DIAGNOSIS — Z79891 Long term (current) use of opiate analgesic: Secondary | ICD-10-CM | POA: Insufficient documentation

## 2019-03-15 DIAGNOSIS — Z79899 Other long term (current) drug therapy: Secondary | ICD-10-CM | POA: Insufficient documentation

## 2019-03-15 DIAGNOSIS — G8929 Other chronic pain: Secondary | ICD-10-CM

## 2019-03-15 DIAGNOSIS — M5416 Radiculopathy, lumbar region: Secondary | ICD-10-CM | POA: Insufficient documentation

## 2019-03-15 MED ORDER — OXYCODONE HCL ER 20 MG PO T12A: 20.0000 mg | EXTENDED_RELEASE_TABLET | Freq: Two times a day (BID) | ORAL | 0 refills | Status: DC

## 2019-03-15 MED ORDER — OXYCODONE-ACETAMINOPHEN 7.5-325 MG PO TABS: 1.0000 | ORAL_TABLET | ORAL | 0 refills | Status: DC | PRN

## 2019-03-15 NOTE — Progress Notes (Signed)
Subjective:    Patient ID: Bruce Olson, male    DOB: 03-12-1955, 64 y.o.   MRN: [id number]  HPI: Bruce Olson is a 64 y.o. male who returns for follow up appointment for chronic pain and medication refill. He states his pain is located in his neck radiating into his right arm and mid- lower back pain. He rates his pain 7. His current exercise regime is walking and performing stretching exercises.  Mr. Portman Morphine equivalent is 105.00  MME.  Last Oral Swab was Performed on 01/17/2019, it was consistent.   Pain Inventory Average Pain 7 Pain Right Now 7 My pain is sharp, stabbing and tingling  In the last 24 hours, has pain interfered with the following? General activity 7 Relation with others 7 Enjoyment of life 7 What TIME of day is your pain at its worst? all Sleep (in general) Poor  Pain is worse with: walking, sitting and standing Pain improves with: heat/ice and TENS Relief from Meds: 3  Mobility walk without assistance how many minutes can you walk? 20 ability to climb steps?  yes do you drive?  yes  Function disabled: date disabled .  Neuro/Psych weakness numbness tremor trouble walking  Prior Studies Any changes since last visit?  no  Physicians involved in your care Any changes since last visit?  no   Family History  Problem Relation Age of Onset  . Anesthesia problems Neg Hx   . Hypotension Neg Hx   . Malignant hyperthermia Neg Hx   . Pseudochol deficiency Neg Hx    Social History   Socioeconomic History  . Marital status: Single    Spouse name: Not on file  . Number of children: Not on file  . Years of education: Not on file  . Highest education level: Not on file  Occupational History  . Not on file  Tobacco Use  . Smoking status: Current Every Day Smoker    Packs/day: 1.00    Years: 33.00    Pack years: 33.00    Types: Cigarettes  . Smokeless tobacco: Never Used  Substance and Sexual Activity  . Alcohol use: No    . Drug use: No  . Sexual activity: Never    Comment: back problems  Other Topics Concern  . Not on file  Social History Narrative  . Not on file   Social Determinants of Health   Financial Resource Strain:   . Difficulty of Paying Living Expenses: Not on file  Food Insecurity:   . Worried About Charity fundraiser in the Last Year: Not on file  . Ran Out of Food in the Last Year: Not on file  Transportation Needs:   . Lack of Transportation (Medical): Not on file  . Lack of Transportation (Non-Medical): Not on file  Physical Activity:   . Days of Exercise per Week: Not on file  . Minutes of Exercise per Session: Not on file  Stress:   . Feeling of Stress : Not on file  Social Connections:   . Frequency of Communication with Friends and Family: Not on file  . Frequency of Social Gatherings with Friends and Family: Not on file  . Attends Religious Services: Not on file  . Active Member of Clubs or Organizations: Not on file  . Attends Archivist Meetings: Not on file  . Marital Status: Not on file   Past Surgical History:  Procedure Laterality Date  . BACK SURGERY  2013  Back surgery  (fusion)09/15/2010 & 2013  . BACK SURGERY  03-2013  . HARDWARE REMOVAL N/A 03/14/2013   Procedure: HARDWARE REMOVAL;  Surgeon: Eustace Moore, MD;  Location: [facility];  Service: Neurosurgery;  Laterality: N/A;   Past Medical History:  Diagnosis Date  . Goals of care, counseling/discussion 04/07/2016  . Iron deficiency anemia due to chronic blood loss 10/21/2016  . 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 No pertinent past medical history   . Occupational injury 2013   resulted in back injury that led to surgery  . Thyroid cancer (HCC)    BP (!) 154/89   Pulse 73   Temp (!) 97.5 F (36.4 C)   Ht 5\' 6"  (1.676 m)   Wt 128 lb (58.1 kg)   SpO2 91%   BMI 20.66 kg/m   Opioid Risk Score:   Fall Risk Score:  `1  Depression screen PHQ 2/9  Depression screen [facility] 2/9 11/14/2018  02/16/2018 12/22/2017 10/24/2017 06/22/2017 04/20/2017 03/20/2017  Decreased Interest 0 0 0 0 0 0 0  Down, Depressed, Hopeless 0 0 0 0 0 0 0  PHQ - 2 Score 0 0 0 0 0 0 0  Altered sleeping - - - - - - -  Tired, decreased energy - - - - - - -  Change in appetite - - - - - - -  Feeling bad or failure about yourself  - - - - - - -  Trouble concentrating - - - - - - -  Moving slowly or fidgety/restless - - - - - - -  Suicidal thoughts - - - - - - -  PHQ-9 Score - - - - - - -    Review of Systems  Constitutional: Negative.   HENT: Negative.   Eyes: Negative.   Respiratory: Negative.   Cardiovascular: Negative.   Gastrointestinal: Negative.   Endocrine: Negative.   Genitourinary: Negative.   Musculoskeletal: Positive for arthralgias, back pain, gait problem, neck pain and neck stiffness.  Skin: Negative.   Allergic/Immunologic: Negative.   Neurological: Positive for tremors, weakness and numbness.  Psychiatric/Behavioral: Negative.   All other systems reviewed and are negative.      Objective:   Physical Exam Vitals and nursing note reviewed.  Constitutional:      Appearance: Normal appearance.  Neck:     Comments: Cervical Paraspinal Tenderness: C-5-C-6 Mainly Right Side Cardiovascular:     Rate and Rhythm: Normal rate and regular rhythm.     Pulses: Normal pulses.     Heart sounds: Normal heart sounds.  Pulmonary:     Effort: Pulmonary effort is normal.     Breath sounds: Normal breath sounds.  Musculoskeletal:     Cervical back: Normal range of motion and neck supple.     Comments: Normal Muscle Bulk and Muscle Testing Reveals:  Upper Extremities: Full ROM and Muscle Strength 5/5 Right AC Joint Tenderness  Thoracic Paraspinal Tenderness: T-1-T-3 Thoracic Hypersensitivity: T-10-T-12  Lumbar Hypersensitivity Lower Extremities: Full ROM and Muscle Strength 5/5 Arises from Table Slowly Narrow Based Gait   Skin:    General: Skin is warm and dry.  Neurological:     Mental  Status: He is alert and oriented to person, place, and time.  Psychiatric:        Mood and Affect: Mood normal.        Behavior: Behavior normal.           Assessment & Plan:  1. Lumbar postlaminectomy syndrome status  post lumbar fusion x3: His most recent surgery was 03/14/2013. He had hardware removal and bone allograft waist and L2-3. Continue to Monitor. Continue current Medication Regimen.03/15/2019 Refilled: oxyCODONE 7.5/325mg  one tablet every 4 hours as needed #100 [id number] mg one tablet every 12 hours #60.  We will continue the opioid monitoring program, this consists of regular clinic visits, examinations, urine drug screen, pill counts as well as use of [place] Controlled Substance Reporting System. 2. Lumbar Radiculopathy: Continuecurrent medication regimen withLyrica.03/15/2019 3. Insomnia: Continuecurrent medication regimen withTrazodone.03/15/2019 4. Nausea: Continue Zofran as needed.Oncology Following.03/15/2019 5. Cervical Spondylosis/ Cervicalgia/ Cervical Radiculitis: Continuecurrent medication regimen withLyrica.03/15/2019 6. Cervical Myofascial Pain Syndrome: Continue current treatment regimen. Continue to monitor.03/15/2019.  34minutes of face to face patient care time was spent during this visit. All questions were encouraged and answered.  F/U in 1 month

## 2019-04-12 ENCOUNTER — Encounter: Payer: Self-pay | Admitting: Registered Nurse

## 2019-04-12 ENCOUNTER — Other Ambulatory Visit: Payer: Self-pay

## 2019-04-12 ENCOUNTER — Encounter: Payer: Medicare Other | Attending: Physical Medicine and Rehabilitation | Admitting: Registered Nurse

## 2019-04-12 VITALS — BP 129/91 | HR 69 | Temp 98.7°F | Ht 66.0 in | Wt 124.4 lb

## 2019-04-12 DIAGNOSIS — Z79891 Long term (current) use of opiate analgesic: Secondary | ICD-10-CM | POA: Insufficient documentation

## 2019-04-12 DIAGNOSIS — M542 Cervicalgia: Secondary | ICD-10-CM | POA: Diagnosis not present

## 2019-04-12 DIAGNOSIS — M5412 Radiculopathy, cervical region: Secondary | ICD-10-CM

## 2019-04-12 DIAGNOSIS — M546 Pain in thoracic spine: Secondary | ICD-10-CM

## 2019-04-12 DIAGNOSIS — G8929 Other chronic pain: Secondary | ICD-10-CM

## 2019-04-12 DIAGNOSIS — M5416 Radiculopathy, lumbar region: Secondary | ICD-10-CM | POA: Insufficient documentation

## 2019-04-12 DIAGNOSIS — Z5181 Encounter for therapeutic drug level monitoring: Secondary | ICD-10-CM | POA: Insufficient documentation

## 2019-04-12 DIAGNOSIS — Z79899 Other long term (current) drug therapy: Secondary | ICD-10-CM | POA: Diagnosis not present

## 2019-04-12 DIAGNOSIS — M7918 Myalgia, other site: Secondary | ICD-10-CM | POA: Diagnosis not present

## 2019-04-12 DIAGNOSIS — G894 Chronic pain syndrome: Secondary | ICD-10-CM

## 2019-04-12 DIAGNOSIS — Z9889 Other specified postprocedural states: Secondary | ICD-10-CM | POA: Diagnosis not present

## 2019-04-12 DIAGNOSIS — M961 Postlaminectomy syndrome, not elsewhere classified: Secondary | ICD-10-CM | POA: Insufficient documentation

## 2019-04-12 MED ORDER — OXYCODONE-ACETAMINOPHEN 7.5-325 MG PO TABS: 1.0000 | ORAL_TABLET | ORAL | 0 refills | Status: DC | PRN

## 2019-04-12 MED ORDER — OXYCODONE HCL ER 20 MG PO T12A: 20.0000 mg | EXTENDED_RELEASE_TABLET | Freq: Two times a day (BID) | ORAL | 0 refills | Status: DC

## 2019-04-12 NOTE — Progress Notes (Signed)
Subjective:    Patient ID: Bruce Olson, male    DOB: [birth year], 64 y.o.   MRN: [id number]  HPI: Bruce Olson is a 64 y.o. male who returns for follow up appointment for chronic pain and medication refill. He states his pain is located in his neck radiating into his right shoulder and mid- lower back pain. He rates his pain 7. His current exercise regime is walking also reports he is using his Tens Unit daily.   [name] Morphine equivalent is 105.00MME.    Last Oral Swab was Performed on 01/17/2020, it was consistent.   Pain Inventory Average Pain 7 Pain Right Now 7 My pain is sharp, stabbing and tingling  In the last 24 hours, has pain interfered with the following? General activity 7 Relation with others 7 Enjoyment of life 7 What TIME of day is your pain at its worst? all Sleep (in general) Poor  Pain is worse with: walking, sitting and standing Pain improves with: heat/ice, medication and TENS Relief from Meds: 3  Mobility walk without assistance use a cane how many minutes can you walk? 1/4 mile ability to climb steps?  yes do you drive?  yes  Function disabled: date disabled 12/2010  Neuro/Psych weakness numbness tremor trouble walking  Prior Studies Any changes since last visit?  no x-rays CT/MRI  Physicians involved in your care Any changes since last visit?  no Neurosurgeon Sherley Bounds, MD   Family History  Problem Relation Age of Onset  . Anesthesia problems Neg Hx   . Hypotension Neg Hx   . Malignant hyperthermia Neg Hx   . Pseudochol deficiency Neg Hx    Social History   Socioeconomic History  . Marital status: Single    Spouse name: Not on file  . Number of children: Not on file  . Years of education: Not on file  . Highest education level: Not on file  Occupational History  . Not on file  Tobacco Use  . Smoking status: Current Every Day Smoker    Packs/day: 1.00    Years: 33.00    Pack years: 33.00    Types:  Cigarettes  . Smokeless tobacco: Never Used  Substance and Sexual Activity  . Alcohol use: No  . Drug use: No  . Sexual activity: Never    Comment: back problems  Other Topics Concern  . Not on file  Social History Narrative  . Not on file   Social Determinants of Health   Financial Resource Strain:   . Difficulty of Paying Living Expenses:   Food Insecurity:   . Worried About Charity fundraiser in the Last Year:   . Arboriculturist in the Last Year:   Transportation Needs:   . Film/video editor (Medical):   Marland Kitchen Lack of Transportation (Non-Medical):   Physical Activity:   . Days of Exercise per Week:   . Minutes of Exercise per Session:   Stress:   . Feeling of Stress :   Social Connections:   . Frequency of Communication with Friends and Family:   . Frequency of Social Gatherings with Friends and Family:   . Attends Religious Services:   . Active Member of Clubs or Organizations:   . Attends Archivist Meetings:   Marland Kitchen Marital Status:    Past Surgical History:  Procedure Laterality Date  . BACK SURGERY  2013   Back surgery  (fusion)09/15/2010 & 2013  . BACK SURGERY  03-2013  .  HARDWARE REMOVAL N/A 03/14/2013   Procedure: HARDWARE REMOVAL;  Surgeon: Eustace Moore, MD;  Location: [place] [facility];  Service: Neurosurgery;  Laterality: N/A;   Past Medical History:  Diagnosis Date  . Goals of care, counseling/discussion 04/07/2016  . Iron deficiency anemia due to chronic blood loss 10/21/2016  . 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 No pertinent past medical history   . Occupational injury 2013   resulted in back injury that led to surgery  . Thyroid cancer (HCC)    BP (!) 129/91   Pulse 69   Temp 98.7 F (37.1 C)   Ht 5\' 6"  (1.676 m)   Wt 124 lb 6.4 oz (56.4 kg)   SpO2 93%   BMI 20.08 kg/m   Opioid Risk Score:   Fall Risk Score:  `1  Depression screen PHQ 2/9  Depression screen [facility] 2/9 11/14/2018 02/16/2018 12/22/2017 10/24/2017 06/22/2017 04/20/2017 03/20/2017    Decreased Interest 0 0 0 0 0 0 0  Down, Depressed, Hopeless 0 0 0 0 0 0 0  PHQ - 2 Score 0 0 0 0 0 0 0  Altered sleeping - - - - - - -  Tired, decreased energy - - - - - - -  Change in appetite - - - - - - -  Feeling bad or failure about yourself  - - - - - - -  Trouble concentrating - - - - - - -  Moving slowly or fidgety/restless - - - - - - -  Suicidal thoughts - - - - - - -  PHQ-9 Score - - - - - - -    Review of Systems  Musculoskeletal: Positive for gait problem.  Neurological: Positive for tremors, weakness and numbness.  All other systems reviewed and are negative.      Objective:   Physical Exam Vitals and nursing note reviewed.  Constitutional:      Appearance: Normal appearance.  Neck:     Comments: Cervical Paraspinal Tenderness: C-5-C-6  Cardiovascular:     Rate and Rhythm: Normal rate and regular rhythm.     Pulses: Normal pulses.     Heart sounds: Normal heart sounds.  Pulmonary:     Effort: Pulmonary effort is normal.     Breath sounds: Normal breath sounds.  Musculoskeletal:     Cervical back: Normal range of motion and neck supple.     Comments: Normal Muscle Bulk and Muscle Testing Reveals:  Upper Extremities: Decreased ROM 90 Degrees  and Muscle Strength 4/5 Right AC Joint Tenderness  Thoracic Paraspinal Tenderness: T-1-T-3 Lumbar Hypersensitivity Lower Extremities: Full ROM and Muscle Strength 5/5 Arises From Table with ease Narrow Based  Gait   Skin:    General: Skin is warm and dry.  Neurological:     Mental Status: He is alert and oriented to person, place, and time.  Psychiatric:        Mood and Affect: Mood normal.        Behavior: Behavior normal.           Assessment & Plan:  1. Lumbar postlaminectomy syndrome status post lumbar fusion x3: His most recent surgery was 03/14/2013. He had hardware removal and bone allograft waist and L2-3. Continue to Monitor. Continue current Medication Regimen.04/12/2019 Refilled: oxyCODONE  7.5/325mg  one tablet every 4 hours as needed #100 [id number] mg one tablet every 12 hours #60.  We will continue the opioid monitoring program, this consists of regular clinic visits, examinations, urine drug screen, pill  counts as well as use of [place] Controlled Substance Reporting System. 2. Lumbar Radiculopathy: Continuecurrent medication regimen withLyrica.04/12/2019 3. Insomnia: Continuecurrent medication regimen withTrazodone.04/12/2019 4. Nausea: Continue Zofran as needed.Oncology Following.04/12/2019 5. Cervical Spondylosis/ Cervicalgia/ Cervical Radiculitis: Continuecurrent medication regimen withLyrica.04/12/2019 6. Cervical Myofascial Pain Syndrome: Continue current treatment regimen. Continue to monitor.04/12/2019.  52minutes of face to face patient care time was spent during this visit. All questions were encouraged and answered.  F/U in 1 month

## 2019-05-13 ENCOUNTER — Encounter: Payer: Medicare Other | Attending: Physical Medicine and Rehabilitation | Admitting: Registered Nurse

## 2019-05-13 ENCOUNTER — Encounter: Payer: Self-pay | Admitting: Registered Nurse

## 2019-05-13 ENCOUNTER — Other Ambulatory Visit: Payer: Self-pay

## 2019-05-13 VITALS — BP 148/81 | HR 72 | Temp 98.7°F | Ht 67.0 in | Wt 125.0 lb

## 2019-05-13 DIAGNOSIS — M961 Postlaminectomy syndrome, not elsewhere classified: Secondary | ICD-10-CM | POA: Diagnosis not present

## 2019-05-13 DIAGNOSIS — R202 Paresthesia of skin: Secondary | ICD-10-CM | POA: Diagnosis not present

## 2019-05-13 DIAGNOSIS — M546 Pain in thoracic spine: Secondary | ICD-10-CM | POA: Diagnosis not present

## 2019-05-13 DIAGNOSIS — M542 Cervicalgia: Secondary | ICD-10-CM | POA: Insufficient documentation

## 2019-05-13 DIAGNOSIS — G8929 Other chronic pain: Secondary | ICD-10-CM

## 2019-05-13 DIAGNOSIS — Z79891 Long term (current) use of opiate analgesic: Secondary | ICD-10-CM | POA: Diagnosis not present

## 2019-05-13 DIAGNOSIS — Z79899 Other long term (current) drug therapy: Secondary | ICD-10-CM | POA: Insufficient documentation

## 2019-05-13 DIAGNOSIS — M7918 Myalgia, other site: Secondary | ICD-10-CM | POA: Diagnosis not present

## 2019-05-13 DIAGNOSIS — M5416 Radiculopathy, lumbar region: Secondary | ICD-10-CM | POA: Diagnosis not present

## 2019-05-13 DIAGNOSIS — G894 Chronic pain syndrome: Secondary | ICD-10-CM | POA: Insufficient documentation

## 2019-05-13 DIAGNOSIS — Z9889 Other specified postprocedural states: Secondary | ICD-10-CM | POA: Insufficient documentation

## 2019-05-13 DIAGNOSIS — Z5181 Encounter for therapeutic drug level monitoring: Secondary | ICD-10-CM | POA: Insufficient documentation

## 2019-05-13 DIAGNOSIS — M5412 Radiculopathy, cervical region: Secondary | ICD-10-CM

## 2019-05-13 MED ORDER — OXYCODONE-ACETAMINOPHEN 7.5-325 MG PO TABS: 1.0000 | ORAL_TABLET | ORAL | 0 refills | Status: DC | PRN

## 2019-05-13 MED ORDER — OXYCODONE HCL ER 20 MG PO T12A: 20.0000 mg | EXTENDED_RELEASE_TABLET | Freq: Two times a day (BID) | ORAL | 0 refills | Status: DC

## 2019-05-13 NOTE — Progress Notes (Signed)
Subjective:    Patient ID: Bruce Olson, male    DOB: [birth year], 64 y.o.   MRN: [id number]  HPI: Bruce Olson is a 64 y.o. male who returns for follow up appointment for chronic pain and medication refill. He states his pain is located in his neck radiating into his right shoulder, mid- lower back pain and  right thigh tingling and nubmess at times . He rates his pain 7. His current exercise regime is walking and performing stretching exercises.  Bruce Olson Morphine equivalent is 105.00  MME.  Oral Swab was Performed Today.    Pain Inventory Average Pain 7 Pain Right Now 7 My pain is sharp, stabbing and tingling  In the last 24 hours, has pain interfered with the following? General activity 7 Relation with others 7 Enjoyment of life 7 What TIME of day is your pain at its worst? all Sleep (in general) Poor  Pain is worse with: walking, sitting and standing Pain improves with: heat/ice, medication and TENS Relief from Meds: 3  Mobility walk without assistance use a cane how many minutes can you walk? 1/4 mile ability to climb steps?  yes do you drive?  yes  Function disabled: date disabled 05/2010  Neuro/Psych weakness numbness tremor trouble walking  Prior Studies x-rays CT/MRI  Physicians involved in your care Neurosurgeon .   Family History  Problem Relation Age of Onset  . Anesthesia problems Neg Hx   . Hypotension Neg Hx   . Malignant hyperthermia Neg Hx   . Pseudochol deficiency Neg Hx    Social History   Socioeconomic History  . Marital status: Single    Spouse name: Not on file  . Number of children: Not on file  . Years of education: Not on file  . Highest education level: Not on file  Occupational History  . Not on file  Tobacco Use  . Smoking status: Current Every Day Smoker    Packs/day: 1.00    Years: 33.00    Pack years: 33.00    Types: Cigarettes  . Smokeless tobacco: Never Used  Substance and Sexual Activity  .  Alcohol use: No  . Drug use: No  . Sexual activity: Never    Comment: back problems  Other Topics Concern  . Not on file  Social History Narrative  . Not on file   Social Determinants of Health   Financial Resource Strain:   . Difficulty of Paying Living Expenses:   Food Insecurity:   . Worried About Charity fundraiser in the Last Year:   . Arboriculturist in the Last Year:   Transportation Needs:   . Film/video editor (Medical):   Marland Kitchen Lack of Transportation (Non-Medical):   Physical Activity:   . Days of Exercise per Week:   . Minutes of Exercise per Session:   Stress:   . Feeling of Stress :   Social Connections:   . Frequency of Communication with Friends and Family:   . Frequency of Social Gatherings with Friends and Family:   . Attends Religious Services:   . Active Member of Clubs or Organizations:   . Attends Archivist Meetings:   Marland Kitchen Marital Status:    Past Surgical History:  Procedure Laterality Date  . BACK SURGERY  2013   Back surgery  (fusion)09/15/2010 & 2013  . BACK SURGERY  03-2013  . HARDWARE REMOVAL N/A 03/14/2013   Procedure: HARDWARE REMOVAL;  Surgeon: Bruce Moore, MD;  Location: [place] [facility];  Service: Neurosurgery;  Laterality: N/A;   Past Medical History:  Diagnosis Date  . Goals of care, counseling/discussion 04/07/2016  . Iron deficiency anemia due to chronic blood loss 10/21/2016  . 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 No pertinent past medical history   . Occupational injury 2013   resulted in back injury that led to surgery  . Thyroid cancer (HCC)    BP (!) 148/81   Pulse 72   Temp 98.7 F (37.1 C)   Ht 5\' 7"  (1.702 m)   Wt 125 lb (56.7 kg)   SpO2 91%   BMI 19.58 kg/m   Opioid Risk Score:   Fall Risk Score:  `1  Depression screen PHQ 2/9  Depression screen [facility] 2/9 11/14/2018 02/16/2018 12/22/2017 10/24/2017 06/22/2017 04/20/2017 03/20/2017  Decreased Interest 0 0 0 0 0 0 0  Down, Depressed, Hopeless 0 0 0 0 0 0 0  PHQ - 2  Score 0 0 0 0 0 0 0  Altered sleeping - - - - - - -  Tired, decreased energy - - - - - - -  Change in appetite - - - - - - -  Feeling bad or failure about yourself  - - - - - - -  Trouble concentrating - - - - - - -  Moving slowly or fidgety/restless - - - - - - -  Suicidal thoughts - - - - - - -  PHQ-9 Score - - - - - - -     Review of Systems  Musculoskeletal: Positive for gait problem.  Neurological: Positive for tremors, weakness and numbness.  All other systems reviewed and are negative.      Objective:   Physical Exam Vitals and nursing note reviewed.  Constitutional:      Appearance: Normal appearance.  Cardiovascular:     Rate and Rhythm: Normal rate and regular rhythm.     Pulses: Normal pulses.     Heart sounds: Normal heart sounds.  Pulmonary:     Effort: Pulmonary effort is normal.     Breath sounds: Normal breath sounds.  Musculoskeletal:     Cervical back: Normal range of motion and neck supple.     Comments: Normal Muscle Bulk and Muscle Testing Reveals:  Upper Extremities: Full ROM and Muscle Strength 5/5 Right AC Joint Tenderness  Thoracic and Lumbar Hypersensitivity Lower Extremities: Full ROM and Muscle Strength 5/5 Arises from Table Slowly  Narrow Based  Gait   Skin:    General: Skin is warm and dry.  Neurological:     Mental Status: He is alert and oriented to person, place, and time.  Psychiatric:        Mood and Affect: Mood normal.        Behavior: Behavior normal.           Assessment & Plan:  1. Lumbar postlaminectomy syndrome status post lumbar fusion x3: His most recent surgery was 03/14/2013. He had hardware removal and bone allograft waist and L2-3. Continue to Monitor. Continue current Medication Regimen.05/13/2019 Refilled: oxyCODONE 7.5/325mg  one tablet every 4 hours as needed #100 [id number] mg one tablet every 12 hours #60. Second script sent to accommodate scheduled appointment.  We will continue the opioid monitoring  program, this consists of regular clinic visits, examinations, urine drug screen, pill counts as well as use of [place] Controlled Substance Reporting System. 2. Lumbar Radiculopathy: Continuecurrent medication regimen withLyrica.05/13/2019 3. Insomnia: Continuecurrent medication regimen withTrazodone.05/13/2019 4. Nausea:  Continue Zofran as needed.Oncology Following.05/13/2019 5. Cervical Spondylosis/ Cervicalgia/ Cervical Radiculitis: Continuecurrent medication regimen withLyrica.05/13/2019 6. Cervical Myofascial Pain Syndrome: Continue current treatment regimen. Continue to monitor.05/13/2019.  79minutes of face to face patient care time was spent during this visit. All questions were encouraged and answered.  F/U in 1 month

## 2019-05-17 LAB — DRUG TOX MONITOR 1 W/CONF, ORAL FLD
Amphetamines: NEGATIVE ng/mL (ref <10)
Barbiturates: NEGATIVE ng/mL (ref <10)
Benzodiazepines: NEGATIVE ng/mL (ref <0.50)
Buprenorphine: NEGATIVE ng/mL (ref <0.10)
Cocaine: NEGATIVE ng/mL (ref <5.0)
Codeine: NEGATIVE ng/mL (ref <2.5)
Cotinine: 107.4 ng/mL — ABNORMAL HIGH (ref <5.0)
Dihydrocodeine: NEGATIVE ng/mL (ref <2.5)
Fentanyl: NEGATIVE ng/mL (ref <0.10)
Heroin Metabolite: NEGATIVE ng/mL (ref <1.0)
Hydrocodone: NEGATIVE ng/mL (ref <2.5)
Hydromorphone: NEGATIVE ng/mL (ref <2.5)
MARIJUANA: NEGATIVE ng/mL (ref <2.5)
MDMA: NEGATIVE ng/mL (ref <10)
Meprobamate: NEGATIVE ng/mL (ref <2.5)
Methadone: NEGATIVE ng/mL (ref <5.0)
Morphine: NEGATIVE ng/mL (ref <2.5)
Nicotine Metabolite: POSITIVE ng/mL — AB (ref <5.0)
Norhydrocodone: NEGATIVE ng/mL (ref <2.5)
Noroxycodone: 66 ng/mL — ABNORMAL HIGH (ref <2.5)
Opiates: POSITIVE ng/mL — AB (ref <2.5)
Oxycodone: 238.8 ng/mL — ABNORMAL HIGH (ref <2.5)
Oxymorphone: NEGATIVE ng/mL (ref <2.5)
Phencyclidine: NEGATIVE ng/mL (ref <10)
Tapentadol: NEGATIVE ng/mL (ref <5.0)
Tramadol: NEGATIVE ng/mL (ref <5.0)
Zolpidem: NEGATIVE ng/mL (ref <5.0)

## 2019-05-17 LAB — DRUG TOX ALC METAB W/CON, ORAL FLD: Alcohol Metabolite: NEGATIVE ng/mL (ref <25)

## 2019-05-20 ENCOUNTER — Telehealth: Payer: Self-pay | Admitting: *Deleted

## 2019-05-20 NOTE — Telephone Encounter (Signed)
Oral swab drug screen was consistent for prescribed medications.  ?

## 2019-06-10 DIAGNOSIS — H25813 Combined forms of age-related cataract, bilateral: Secondary | ICD-10-CM | POA: Diagnosis not present

## 2019-06-10 DIAGNOSIS — H527 Unspecified disorder of refraction: Secondary | ICD-10-CM | POA: Diagnosis not present

## 2019-06-20 ENCOUNTER — Other Ambulatory Visit: Payer: Self-pay

## 2019-06-20 ENCOUNTER — Encounter: Payer: Medicare Other | Attending: Physical Medicine and Rehabilitation | Admitting: Registered Nurse

## 2019-06-20 ENCOUNTER — Encounter: Payer: Self-pay | Admitting: Registered Nurse

## 2019-06-20 VITALS — BP 117/69 | HR 81 | Temp 98.7°F | Ht 67.0 in | Wt 127.4 lb

## 2019-06-20 DIAGNOSIS — Z5181 Encounter for therapeutic drug level monitoring: Secondary | ICD-10-CM

## 2019-06-20 DIAGNOSIS — M542 Cervicalgia: Secondary | ICD-10-CM | POA: Insufficient documentation

## 2019-06-20 DIAGNOSIS — M7918 Myalgia, other site: Secondary | ICD-10-CM | POA: Diagnosis not present

## 2019-06-20 DIAGNOSIS — Z79891 Long term (current) use of opiate analgesic: Secondary | ICD-10-CM | POA: Insufficient documentation

## 2019-06-20 DIAGNOSIS — M5416 Radiculopathy, lumbar region: Secondary | ICD-10-CM | POA: Insufficient documentation

## 2019-06-20 DIAGNOSIS — Z79899 Other long term (current) drug therapy: Secondary | ICD-10-CM | POA: Diagnosis not present

## 2019-06-20 DIAGNOSIS — G894 Chronic pain syndrome: Secondary | ICD-10-CM | POA: Insufficient documentation

## 2019-06-20 DIAGNOSIS — G8929 Other chronic pain: Secondary | ICD-10-CM

## 2019-06-20 DIAGNOSIS — M546 Pain in thoracic spine: Secondary | ICD-10-CM

## 2019-06-20 DIAGNOSIS — R202 Paresthesia of skin: Secondary | ICD-10-CM | POA: Diagnosis not present

## 2019-06-20 DIAGNOSIS — Z9889 Other specified postprocedural states: Secondary | ICD-10-CM | POA: Diagnosis not present

## 2019-06-20 DIAGNOSIS — M961 Postlaminectomy syndrome, not elsewhere classified: Secondary | ICD-10-CM | POA: Diagnosis not present

## 2019-06-20 DIAGNOSIS — M5412 Radiculopathy, cervical region: Secondary | ICD-10-CM

## 2019-06-20 MED ORDER — OXYCODONE-ACETAMINOPHEN 7.5-325 MG PO TABS: 1.0000 | ORAL_TABLET | ORAL | 0 refills | Status: DC | PRN

## 2019-06-20 MED ORDER — OXYCODONE HCL ER 20 MG PO T12A: 20.0000 mg | EXTENDED_RELEASE_TABLET | Freq: Two times a day (BID) | ORAL | 0 refills | Status: DC

## 2019-06-20 MED ORDER — PREGABALIN 100 MG PO CAPS: 100.0000 mg | ORAL_CAPSULE | Freq: Three times a day (TID) | ORAL | 5 refills | Status: DC

## 2019-06-20 NOTE — Progress Notes (Signed)
Subjective:    Patient ID: Bruce Olson, male    DOB: 06/13/1955, 64 y.o.   MRN: [id number]  HPI: Bruce Olson is a 64 y.o. male who returns for follow up appointment for chronic pain and medication refill. He states his pain is located in his neck radiating into his right shoulder, mid- lower back pain and right leg numbness. He  rates his pain 7. His current exercise regime is walking and performing stretching exercises.  [name] Morphine equivalent is 105.00 MME.    Last Oral Swab was Performed on 05/13/2019, it was consistent.     Pain Inventory Average Pain 7 Pain Right Now 7 My pain is sharp, stabbing and tingling  In the last 24 hours, has pain interfered with the following? General activity 7 Relation with others 7 Enjoyment of life 7 What TIME of day is your pain at its worst? all Sleep (in general) Poor  Pain is worse with: walking, sitting and standing Pain improves with: heat/ice, medication and TENS Relief from Meds: 3  Mobility walk without assistance use a cane ability to climb steps?  yes do you drive?  yes  Function disabled: date disabled 05/1998  Neuro/Psych weakness numbness tremor trouble walking  Prior Studies x-rays CT/MRI  Physicians involved in your care Neurosurgeon .   Family History  Problem Relation Age of Onset  . Anesthesia problems Neg Hx   . Hypotension Neg Hx   . Malignant hyperthermia Neg Hx   . Pseudochol deficiency Neg Hx    Social History   Socioeconomic History  . Marital status: Single    Spouse name: Not on file  . Number of children: Not on file  . Years of education: Not on file  . Highest education level: Not on file  Occupational History  . Not on file  Tobacco Use  . Smoking status: Current Every Day Smoker    Packs/day: 1.00    Years: 33.00    Pack years: 33.00    Types: Cigarettes  . Smokeless tobacco: Never Used  Substance and Sexual Activity  . Alcohol use: No  . Drug  use: No  . Sexual activity: Never    Comment: back problems  Other Topics Concern  . Not on file  Social History Narrative  . Not on file   Social Determinants of Health   Financial Resource Strain:   . Difficulty of Paying Living Expenses:   Food Insecurity:   . Worried About Charity fundraiser in the Last Year:   . Arboriculturist in the Last Year:   Transportation Needs:   . Film/video editor (Medical):   Marland Kitchen Lack of Transportation (Non-Medical):   Physical Activity:   . Days of Exercise per Week:   . Minutes of Exercise per Session:   Stress:   . Feeling of Stress :   Social Connections:   . Frequency of Communication with Friends and Family:   . Frequency of Social Gatherings with Friends and Family:   . Attends Religious Services:   . Active Member of Clubs or Organizations:   . Attends Archivist Meetings:   Marland Kitchen Marital Status:    Past Surgical History:  Procedure Laterality Date  . BACK SURGERY  2013   Back surgery  (fusion)09/15/2010 & 2013  . BACK SURGERY  03-2013  . HARDWARE REMOVAL N/A 03/14/2013   Procedure: HARDWARE REMOVAL;  Surgeon: Eustace Moore, MD;  Location: [facility];  Service: Neurosurgery;  Laterality: N/A;   Past Medical History:  Diagnosis Date  . Goals of care, counseling/discussion 04/07/2016  . Iron deficiency anemia due to chronic blood loss 10/21/2016  . 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 No pertinent past medical history   . Occupational injury 2013   resulted in back injury that led to surgery  . Thyroid cancer (H[facility])    BP 117/69   Pulse 81   Temp 98.7 F (37.1 C)   Ht 5\' 7"  (1.702 m)   Wt 127 lb 6.4 oz (57.8 kg)   SpO2 92%   BMI 19.95 kg/m   Opioid Risk Score:   Fall Risk Score:  `1  Depression screen PHQ 2/9  Depression screen [facility] 2/9 11/14/2018 02/16/2018 12/22/2017 10/24/2017 06/22/2017 04/20/2017 03/20/2017  Decreased Interest 0 0 0 0 0 0 0  Down, Depressed, Hopeless 0 0 0 0 0 0 0  PHQ - 2 Score 0 0 0 0 0 0 0    Altered sleeping - - - - - - -  Tired, decreased energy - - - - - - -  Change in appetite - - - - - - -  Feeling bad or failure about yourself  - - - - - - -  Trouble concentrating - - - - - - -  Moving slowly or fidgety/restless - - - - - - -  Suicidal thoughts - - - - - - -  PHQ-9 Score - - - - - - -     Review of Systems  Musculoskeletal: Positive for gait problem.  Neurological: Positive for tremors, weakness and numbness.  All other systems reviewed and are negative.      Objective:   Physical Exam Vitals and nursing note reviewed.  Constitutional:      Appearance: Normal appearance.  Neck:     Comments: Cervical Paraspinal Tenderness: C-5-C-6 Cardiovascular:     Rate and Rhythm: Normal rate and regular rhythm.     Pulses: Normal pulses.     Heart sounds: Normal heart sounds.  Pulmonary:     Effort: Pulmonary effort is normal.     Breath sounds: Normal breath sounds.  Musculoskeletal:     Cervical back: Normal range of motion and neck supple.     Comments: Normal Muscle Bulk and Muscle Testing Reveals:  Upper Extremities: Full ROM and Muscle Strength 5/5 Right Ac Joint Tenderness  Thoracic Paraspinal Tenderness: T-7-T-9  Lumbar Hypersensitivity Lower Extremities: Full ROM and Muscle Strength 5/5 Arises from Table with ease Narrow Based  Gait   Skin:    General: Skin is warm and dry.  Neurological:     Mental Status: He is alert and oriented to person, place, and time.  Psychiatric:        Mood and Affect: Mood normal.        Behavior: Behavior normal.           Assessment & Plan:  1. Lumbar postlaminectomy syndrome status post lumbar fusion x3: His most recent surgery was 03/14/2013. He had hardware removal and bone allograft waist and L2-3. Continue to Monitor. Continue current Medication Regimen.06/20/2019 Refilled: oxyCODONE 7.5/325mg  one tablet every 4 hours as needed #100 [id number] mg one tablet every 12 hours #60. We will continue the  opioid monitoring program, this consists of regular clinic visits, examinations, urine drug screen, pill counts as well as use of [place] Controlled Substance Reporting System. 2. Lumbar Radiculopathy: Continuecurrent medication regimen withLyrica.06/20/2019 3. Insomnia: Continuecurrent medication regimen withTrazodone.06/20/2019  4. Nausea: Continue Zofran as needed.Oncology Following.06/20/2019 5. Cervical Spondylosis/ Cervicalgia/ Cervical Radiculitis: Continuecurrent medication regimen withLyrica.06/20/2019 6. Cervical Myofascial Pain Syndrome: Continue current treatment regimen. Continue to monitor.06/20/2019.  60minutes of face to face patient care time was spent during this visit. All questions were encouraged and answered.  F/U in 1 month

## 2019-07-05 ENCOUNTER — Encounter: Payer: Self-pay | Admitting: Family

## 2019-07-05 ENCOUNTER — Other Ambulatory Visit: Payer: Self-pay

## 2019-07-05 ENCOUNTER — Telehealth: Payer: Self-pay | Admitting: Family

## 2019-07-05 ENCOUNTER — Inpatient Hospital Stay: Payer: Medicare Other

## 2019-07-05 ENCOUNTER — Inpatient Hospital Stay: Payer: Medicare Other | Attending: Family | Admitting: Family

## 2019-07-05 VITALS — BP 140/70 | HR 76 | Temp 98.7°F | Resp 18 | Wt 126.0 lb

## 2019-07-05 DIAGNOSIS — E611 Iron deficiency: Secondary | ICD-10-CM | POA: Diagnosis not present

## 2019-07-05 DIAGNOSIS — C090 Malignant neoplasm of tonsillar fossa: Secondary | ICD-10-CM

## 2019-07-05 DIAGNOSIS — E034 Atrophy of thyroid (acquired): Secondary | ICD-10-CM | POA: Diagnosis not present

## 2019-07-05 DIAGNOSIS — D500 Iron deficiency anemia secondary to blood loss (chronic): Secondary | ICD-10-CM | POA: Diagnosis not present

## 2019-07-05 DIAGNOSIS — Z923 Personal history of irradiation: Secondary | ICD-10-CM | POA: Diagnosis not present

## 2019-07-05 DIAGNOSIS — Z9221 Personal history of antineoplastic chemotherapy: Secondary | ICD-10-CM | POA: Diagnosis not present

## 2019-07-05 DIAGNOSIS — Z85818 Personal history of malignant neoplasm of other sites of lip, oral cavity, and pharynx: Secondary | ICD-10-CM | POA: Insufficient documentation

## 2019-07-05 DIAGNOSIS — R5383 Other fatigue: Secondary | ICD-10-CM | POA: Diagnosis not present

## 2019-07-05 LAB — CMP (CANCER CENTER ONLY)
ALT: 17 U/L (ref 0–44)
AST: 18 U/L (ref 15–41)
Albumin: 4.1 g/dL (ref 3.5–5.0)
Alkaline Phosphatase: 63 U/L (ref 38–126)
Anion gap: 5 (ref 5–15)
BUN: 16 mg/dL (ref 8–23)
CO2: 29 mmol/L (ref 22–32)
Calcium: 9.2 mg/dL (ref 8.9–10.3)
Chloride: 101 mmol/L (ref 98–111)
Creatinine: 1 mg/dL (ref 0.61–1.24)
GFR, Est AFR Am: >60 mL/min (ref >60)
GFR, Estimated: >60 mL/min (ref >60)
Glucose, Bld: 93 mg/dL (ref 70–99)
Potassium: 4.2 mmol/L (ref 3.5–5.1)
Sodium: 135 mmol/L (ref 135–145)
Total Bilirubin: 0.3 mg/dL (ref 0.3–1.2)
Total Protein: 6.8 g/dL (ref 6.5–8.1)

## 2019-07-05 LAB — CBC WITH DIFFERENTIAL (CANCER CENTER ONLY)
Abs Immature Granulocytes: 0.01 10*3/uL (ref 0.00–0.07)
Basophils Absolute: 0 10*3/uL (ref 0.0–0.1)
Basophils Relative: 1 %
Eosinophils Absolute: 0.1 10*3/uL (ref 0.0–0.5)
Eosinophils Relative: 1 %
HCT: 37.7 % — ABNORMAL LOW (ref 39.0–52.0)
Hemoglobin: 12.7 g/dL — ABNORMAL LOW (ref 13.0–17.0)
Immature Granulocytes: 0 %
Lymphocytes Relative: 21 %
Lymphs Abs: 1.3 10*3/uL (ref 0.7–4.0)
MCH: 30.2 pg (ref 26.0–34.0)
MCHC: 33.7 g/dL (ref 30.0–36.0)
MCV: 89.8 fL (ref 80.0–100.0)
Monocytes Absolute: 0.8 10*3/uL (ref 0.1–1.0)
Monocytes Relative: 12 %
Neutro Abs: 4 10*3/uL (ref 1.7–7.7)
Neutrophils Relative %: 65 %
Platelet Count: 211 10*3/uL (ref 150–400)
RBC: 4.2 MIL/uL — ABNORMAL LOW (ref 4.22–5.81)
RDW: 12.5 % (ref 11.5–15.5)
WBC Count: 6.2 10*3/uL (ref 4.0–10.5)
nRBC: 0 % (ref 0.0–0.2)

## 2019-07-05 NOTE — Progress Notes (Signed)
Hematology and Oncology Follow Up Visit  Bruce Olson [id number] 1955/04/19 64 y.o. 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 He is doing well and states that he rarely has nausea unless he over eats.  He typically grazes throughout the day eating small portions. He verbalized that he is taking his Zyprexa and Reglan as prescribed.  No fever, chills, cough, rash, dizziness, SOB, chest pain, palpitations, abdominal pain or changes in bowel or bladder habits.  No tenderness, numbness or tingling in her extremities. The puffiness in his ankles comes and goes. No redness or edema noted on exam. Pedal pulses are 2+.  No falls or syncopal episodes to report.  No bleeding, bruising or petechiae.  He states that he is hydrating well throughout the day and his weight is stable.   ECOG Performance Status: 1 - Symptomatic but completely ambulatory  Medications:  Allergies as of 07/05/2019   No Known Allergies     Medication List       Accurate as of July 05, 2019  1:07 PM. If you have any questions, ask your nurse or doctor.        docusate sodium 100 MG capsule Commonly known as: COLACE Take 100 mg by mouth daily.   dronabinol 2.5 MG capsule Commonly known as: MARINOL Take 1 capsule (2.5 mg total) by mouth 2 (two) times daily before lunch and supper.   metoCLOPramide 10 MG tablet Commonly known as: REGLAN Take 1 tablet (10 mg total) by mouth 4 (four) times daily.   multivitamin with minerals Tabs tablet Take 1 tablet by mouth daily.   OLANZapine 10 MG tablet Commonly known as: ZYPREXA Take 1 tablet (10 mg total) by mouth at bedtime.   oxyCODONE 20 mg 12 hr tablet Commonly known as: OxyCONTIN Take 1 tablet (20 mg total) by mouth  every 12 (twelve) hours. Do Not Fill Before 07/13/2019   oxyCODONE-acetaminophen 7.5-325 MG tablet Commonly known as: Percocet Take 1 tablet by mouth every 4 (four) hours as needed. May take one extra tablet when pain is sever. No more than 4 a day. Do Not Fill Before 07/13/2019   pregabalin 100 MG capsule Commonly known as: LYRICA Take 1 capsule (100 mg total) by mouth 3 (three) times daily.       Allergies: No Known Allergies  Past Medical History, Surgical history, Social history, and Family History were reviewed and updated.  Review of Systems: All other 10 point review of systems is negative.   Physical Exam:  weight is 126 lb (57.2 kg). His oral temperature is 98.7 F (37.1 C). His blood pressure is 140/70 and his pulse is 76. His respiration is 18 and oxygen saturation is 96%.   Wt Readings from Last 3 Encounters:  07/05/19 126 lb (57.2 kg)  06/20/19 127 lb 6.4 oz (57.8 kg)  05/13/19 125 lb (56.7 kg)    Ocular: Sclerae unicteric, pupils equal, round and reactive to light Ear-nose-throat: Oropharynx clear, dentition fair Lymphatic: No cervical or supraclavicular adenopathy Lungs no rales or rhonchi, good excursion bilaterally Heart regular rate and rhythm, no murmur appreciated Abd soft, nontender, positive bowel sounds, no liver or spleen tip palpated on exam, no fluid wave  MSK no focal spinal tenderness, no  joint edema Neuro: non-focal, well-oriented, appropriate affect Breasts: Deferred   Lab Results  Component Value Date   WBC 6.2 07/05/2019   HGB 12.7 (L) 07/05/2019   HCT 37.7 (L) 07/05/2019   MCV 89.8 07/05/2019   PLT 211 07/05/2019   Lab Results  Component Value Date   FERRITIN 200 07/06/2018   IRON 34 (L) 07/06/2018   TIBC 281 07/06/2018   UIBC 247 07/06/2018   IRONPCTSAT 12 (L) 07/06/2018   Lab Results  Component Value Date   RBC 4.20 (L) 07/05/2019   No results found for: KPAFRELGTCHN, LAMBDASER, KAPLAMBRATIO No results found for:  IGGSERUM, IGA, IGMSERUM No results found for: Odetta Pink, SPEI   Chemistry      Component Value Date/Time   NA 136 03/08/2019 1254   NA 138 01/20/2017 1324   NA 135 (L) 04/05/2016 1333   K 4.2 03/08/2019 1254   K 4.5 01/20/2017 1324   K 4.7 04/05/2016 1333   CL 100 03/08/2019 1254   CL 97 (L) 01/20/2017 1324   CO2 30 03/08/2019 1254   CO2 30 01/20/2017 1324   CO2 29 04/05/2016 1333   BUN 16 03/08/2019 1254   BUN 15 01/20/2017 1324   BUN 23.6 04/05/2016 1333   CREATININE 1.05 03/08/2019 1254   CREATININE 1.2 01/20/2017 1324   CREATININE 1.1 04/05/2016 1333      Component Value Date/Time   CALCIUM 9.1 03/08/2019 1254   CALCIUM 9.2 01/20/2017 1324   CALCIUM 9.2 04/05/2016 1333   ALKPHOS 54 03/08/2019 1254   ALKPHOS 51 01/20/2017 1324   ALKPHOS 67 04/05/2016 1333   AST 16 03/08/2019 1254   AST 17 04/05/2016 1333   ALT 15 03/08/2019 1254   ALT 26 01/20/2017 1324   ALT 10 04/05/2016 1333   BILITOT 0.3 03/08/2019 1254   BILITOT 0.30 04/05/2016 1333       Impression and Plan: Bruce Olson is a very pleasant 64 yo caucasian gentleman with history of squamous cell carcinoma of the left tonsil, HPV positive. He completed chemo/radiation therapy with [facility] in August 2017. So far he has done well and there has been no evidence of recurrence.  We will continue to follow along with him and plan to see him again in 4 months.  Iron studies are pending. We can replace if needed.  He will contact our office with any questions or concerns. We can certainly see him sooner if needed.   Laverna Peace, NP 6/4/20211:07 PM

## 2019-07-05 NOTE — Telephone Encounter (Signed)
Called and LMVM for patient regarding appointments scheduled per 6/4 los

## 2019-07-08 LAB — IRON AND TIBC
Iron: 38 ug/dL — ABNORMAL LOW (ref 42–163)
Saturation Ratios: 13 % — ABNORMAL LOW (ref 20–55)
TIBC: 290 ug/dL (ref 202–409)
UIBC: 252 ug/dL (ref 117–376)

## 2019-07-08 LAB — FERRITIN: Ferritin: 160 ng/mL (ref 24–336)

## 2019-07-08 LAB — TSH: TSH: 1.871 u[IU]/mL (ref 0.320–4.118)

## 2019-07-10 ENCOUNTER — Ambulatory Visit: Payer: Medicare Other | Admitting: Registered Nurse

## 2019-07-11 ENCOUNTER — Ambulatory Visit: Payer: Medicare Other | Admitting: Registered Nurse

## 2019-07-13 ENCOUNTER — Other Ambulatory Visit: Payer: Self-pay | Admitting: Registered Nurse

## 2019-07-15 NOTE — Telephone Encounter (Signed)
Phone in refill

## 2019-07-30 ENCOUNTER — Other Ambulatory Visit: Payer: Self-pay

## 2019-07-30 ENCOUNTER — Encounter: Payer: Medicare Other | Attending: Physical Medicine and Rehabilitation | Admitting: Registered Nurse

## 2019-07-30 ENCOUNTER — Encounter: Payer: Self-pay | Admitting: Registered Nurse

## 2019-07-30 VITALS — BP 144/77 | HR 75 | Temp 97.5°F | Wt 125.0 lb

## 2019-07-30 DIAGNOSIS — Z5181 Encounter for therapeutic drug level monitoring: Secondary | ICD-10-CM

## 2019-07-30 DIAGNOSIS — G894 Chronic pain syndrome: Secondary | ICD-10-CM | POA: Diagnosis not present

## 2019-07-30 DIAGNOSIS — M542 Cervicalgia: Secondary | ICD-10-CM

## 2019-07-30 DIAGNOSIS — Z79891 Long term (current) use of opiate analgesic: Secondary | ICD-10-CM

## 2019-07-30 DIAGNOSIS — Z79899 Other long term (current) drug therapy: Secondary | ICD-10-CM | POA: Insufficient documentation

## 2019-07-30 DIAGNOSIS — M5412 Radiculopathy, cervical region: Secondary | ICD-10-CM | POA: Diagnosis not present

## 2019-07-30 DIAGNOSIS — M5416 Radiculopathy, lumbar region: Secondary | ICD-10-CM | POA: Diagnosis present

## 2019-07-30 DIAGNOSIS — Z9889 Other specified postprocedural states: Secondary | ICD-10-CM

## 2019-07-30 DIAGNOSIS — M961 Postlaminectomy syndrome, not elsewhere classified: Secondary | ICD-10-CM | POA: Diagnosis present

## 2019-07-30 DIAGNOSIS — M7918 Myalgia, other site: Secondary | ICD-10-CM | POA: Diagnosis not present

## 2019-07-30 DIAGNOSIS — M546 Pain in thoracic spine: Secondary | ICD-10-CM

## 2019-07-30 DIAGNOSIS — G8929 Other chronic pain: Secondary | ICD-10-CM

## 2019-07-30 MED ORDER — OXYCODONE HCL ER 20 MG PO T12A: 20.0000 mg | EXTENDED_RELEASE_TABLET | Freq: Two times a day (BID) | ORAL | 0 refills | Status: DC

## 2019-07-30 MED ORDER — OXYCODONE-ACETAMINOPHEN 7.5-325 MG PO TABS: 1.0000 | ORAL_TABLET | ORAL | 0 refills | Status: DC | PRN

## 2019-07-30 NOTE — Progress Notes (Signed)
Subjective:    Patient ID: Bruce Olson, male    DOB: 09/04/1955, 64 y.o.   MRN: [id number]  HPI: Bruce Olson is a 64 y.o. male who returns for follow up appointment for chronic pain and medication refill. He states his pain is located in his neck radiating into his right shoulder and mid- lower back pain.  He rates his pain 7. His current exercise regime is walking and performing stretching exercises.  Bruce Olson Morphine equivalent is 105.00 MME.  Last Oral Swab was Performed on 05/13/2019, it was consistent.   Pain Inventory Average Pain 7 Pain Right Now 7 My pain is sharp, stabbing and tingling  In the last 24 hours, has pain interfered with the following? General activity 7 Relation with others 7 Enjoyment of life 7 What TIME of day is your pain at its worst? all Sleep (in general) Poor  Pain is worse with: walking, sitting and standing Pain improves with: heat/ice, medication and TENS Relief from Meds: 3  Mobility walk without assistance use a cane how many minutes can you walk? 20 ability to climb steps?  yes do you drive?  yes  Function disabled: date disabled .  Neuro/Psych weakness numbness tremor trouble walking  Prior Studies Any changes since last visit?  no  Physicians involved in your care Any changes since last visit?  no   Family History  Problem Relation Age of Onset  . Anesthesia problems Neg Hx   . Hypotension Neg Hx   . Malignant hyperthermia Neg Hx   . Pseudochol deficiency Neg Hx    Social History   Socioeconomic History  . Marital status: Single    Spouse name: Not on file  . Number of children: Not on file  . Years of education: Not on file  . Highest education level: Not on file  Occupational History  . Not on file  Tobacco Use  . Smoking status: Current Every Day Smoker    Packs/day: 1.00    Years: 33.00    Pack years: 33.00    Types: Cigarettes  . Smokeless tobacco: Never Used  Vaping Use  . Vaping  Use: Never used  Substance and Sexual Activity  . Alcohol use: No  . Drug use: No  . Sexual activity: Never    Comment: back problems  Other Topics Concern  . Not on file  Social History Narrative  . Not on file   Social Determinants of Health   Financial Resource Strain:   . Difficulty of Paying Living Expenses:   Food Insecurity:   . Worried About Charity fundraiser in the Last Year:   . Arboriculturist in the Last Year:   Transportation Needs:   . Film/video editor (Medical):   Marland Kitchen Lack of Transportation (Non-Medical):   Physical Activity:   . Days of Exercise per Week:   . Minutes of Exercise per Session:   Stress:   . Feeling of Stress :   Social Connections:   . Frequency of Communication with Friends and Family:   . Frequency of Social Gatherings with Friends and Family:   . Attends Religious Services:   . Active Member of Clubs or Organizations:   . Attends Archivist Meetings:   Marland Kitchen Marital Status:    Past Surgical History:  Procedure Laterality Date  . BACK SURGERY  2013   Back surgery  (fusion)09/15/2010 & 2013  . BACK SURGERY  03-2013  . HARDWARE REMOVAL  N/A 03/14/2013   Procedure: HARDWARE REMOVAL;  Surgeon: Eustace Moore, MD;  Location: [facility];  Service: Neurosurgery;  Laterality: N/A;   Past Medical History:  Diagnosis Date  . Goals of care, counseling/discussion 04/07/2016  . Iron deficiency anemia due to chronic blood loss 10/21/2016  . 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 No pertinent past medical history   . Occupational injury 2013   resulted in back injury that led to surgery  . Thyroid cancer ([place])    There were no vitals taken for this visit.  Opioid Risk Score:   Fall Risk Score:  `1  Depression screen PHQ 2/9  Depression screen [facility] 2/9 11/14/2018 02/16/2018 12/22/2017 10/24/2017 06/22/2017 04/20/2017 03/20/2017  Decreased Interest 0 0 0 0 0 0 0  Down, Depressed, Hopeless 0 0 0 0 0 0 0  PHQ - 2 Score 0 0 0 0 0 0 0  Altered  sleeping - - - - - - -  Tired, decreased energy - - - - - - -  Change in appetite - - - - - - -  Feeling bad or failure about yourself  - - - - - - -  Trouble concentrating - - - - - - -  Moving slowly or fidgety/restless - - - - - - -  Suicidal thoughts - - - - - - -  PHQ-9 Score - - - - - - -    Review of Systems  Musculoskeletal: Positive for gait problem.  Neurological: Positive for tremors, weakness and numbness.  All other systems reviewed and are negative.      Objective:   Physical Exam Vitals and nursing note reviewed.  Constitutional:      Appearance: Normal appearance.  Neck:     Comments: Cervical Paraspinal Tenderness: C-5-C-6 Cardiovascular:     Rate and Rhythm: Normal rate and regular rhythm.     Pulses: Normal pulses.     Heart sounds: Normal heart sounds.  Pulmonary:     Effort: Pulmonary effort is normal.     Breath sounds: Normal breath sounds.  Musculoskeletal:     Cervical back: Normal range of motion and neck supple.     Comments: Normal Muscle Bulk and Muscle Testing Reveals:  Upper Extremities: Decreased ROM 90 Degrees  and Muscle Strength  R/r Right AC Joint Tenderness Thoracic Paraspinal Tenderness: T-7-T-9 Lumbar Hypersensitivity Lower Extremities: Full ROM and Muscle Strength 5/5 Arise From Table with Ease Narrow Based  Gait   Skin:    General: Skin is warm and dry.  Neurological:     Mental Status: He is alert and oriented to person, place, and time.  Psychiatric:        Mood and Affect: Mood normal.        Behavior: Behavior normal.           Assessment & Plan:  1. Lumbar postlaminectomy syndrome status post lumbar fusion x3: His most recent surgery was 03/14/2013. He had hardware removal and bone allograft waist and L2-3. Continue to Monitor. Continue current Medication Regimen.07/30/2019 Refilled: oxyCODONE 7.5/325mg  one tablet every 4 hours as needed #100 [id number] mg one tablet every 12 hours #60. We will continue the  opioid monitoring program, this consists of regular clinic visits, examinations, urine drug screen, pill counts as well as use of [place] Controlled Substance Reporting System. 2. Lumbar Radiculopathy: Continuecurrent medication regimen withLyrica.07/30/2019 3. Insomnia: Continuecurrent medication regimen withTrazodone.07/30/2019 4. Nausea: Continue Zofran as needed.Oncology Following.07/30/2019 5. Cervical Spondylosis/ Cervicalgia/ Cervical  Radiculitis: Continuecurrent medication regimen withLyrica.07/30/2019 6. Cervical Myofascial Pain Syndrome: Continue current treatment regimen. Continue to monitor.07/30/2019.  8minutes of face to face patient care time was spent during this visit. All questions were encouraged and answered.  F/U in 1 month

## 2019-08-07 ENCOUNTER — Other Ambulatory Visit: Payer: Self-pay | Admitting: Hematology & Oncology

## 2019-08-29 ENCOUNTER — Other Ambulatory Visit: Payer: Self-pay

## 2019-08-29 ENCOUNTER — Encounter: Payer: Medicare Other | Attending: Physical Medicine and Rehabilitation | Admitting: Registered Nurse

## 2019-08-29 ENCOUNTER — Encounter: Payer: Self-pay | Admitting: Registered Nurse

## 2019-08-29 VITALS — BP 148/75 | HR 81 | Temp 98.7°F | Ht 66.0 in | Wt 125.0 lb

## 2019-08-29 DIAGNOSIS — M7918 Myalgia, other site: Secondary | ICD-10-CM | POA: Diagnosis not present

## 2019-08-29 DIAGNOSIS — M5416 Radiculopathy, lumbar region: Secondary | ICD-10-CM | POA: Diagnosis not present

## 2019-08-29 DIAGNOSIS — M961 Postlaminectomy syndrome, not elsewhere classified: Secondary | ICD-10-CM | POA: Insufficient documentation

## 2019-08-29 DIAGNOSIS — R202 Paresthesia of skin: Secondary | ICD-10-CM

## 2019-08-29 DIAGNOSIS — Z79899 Other long term (current) drug therapy: Secondary | ICD-10-CM | POA: Diagnosis not present

## 2019-08-29 DIAGNOSIS — G8929 Other chronic pain: Secondary | ICD-10-CM | POA: Diagnosis not present

## 2019-08-29 DIAGNOSIS — M542 Cervicalgia: Secondary | ICD-10-CM | POA: Insufficient documentation

## 2019-08-29 DIAGNOSIS — G894 Chronic pain syndrome: Secondary | ICD-10-CM | POA: Insufficient documentation

## 2019-08-29 DIAGNOSIS — Z5181 Encounter for therapeutic drug level monitoring: Secondary | ICD-10-CM | POA: Diagnosis not present

## 2019-08-29 DIAGNOSIS — Z79891 Long term (current) use of opiate analgesic: Secondary | ICD-10-CM | POA: Diagnosis not present

## 2019-08-29 DIAGNOSIS — Z9889 Other specified postprocedural states: Secondary | ICD-10-CM | POA: Insufficient documentation

## 2019-08-29 DIAGNOSIS — M546 Pain in thoracic spine: Secondary | ICD-10-CM

## 2019-08-29 MED ORDER — OXYCODONE-ACETAMINOPHEN 7.5-325 MG PO TABS: 1.0000 | ORAL_TABLET | ORAL | 0 refills | Status: DC | PRN

## 2019-08-29 MED ORDER — OXYCODONE HCL ER 20 MG PO T12A: 20.0000 mg | EXTENDED_RELEASE_TABLET | Freq: Two times a day (BID) | ORAL | 0 refills | Status: DC

## 2019-08-29 NOTE — Progress Notes (Signed)
Subjective:    Patient ID: Bruce Olson, male    DOB: 1955/09/09, 64 y.o.   MRN: [id number]  HPI: Bruce Olson is a 64 y.o. male who returns for follow up appointment for chronic pain and medication refill. He states his pain is located in his mid- lower back and right lower extremity with numbness. He  rates his pain 7. His current exercise regime is walking and performing stretching exercises.  Bruce Olson Morphine equivalent is 105.00 MME.    Last Oral Swab was Performed on 05/13/2019, it was consistent.    Pain Inventory Average Pain 7 Pain Right Now 7 My pain is sharp, stabbing and tingling  In the last 24 hours, has pain interfered with the following? General activity 7 Relation with others 7 Enjoyment of life 7 What TIME of day is your pain at its worst? all Sleep (in general) Poor  Pain is worse with: walking, sitting and standing Pain improves with: heat/ice, medication and TENS Relief from Meds: 3  Mobility walk without assistance  Function disabled: date disabled .  Neuro/Psych No problems in this area  Prior Studies Any changes since last visit?  no  Physicians involved in your care Any changes since last visit?  no   Family History  Problem Relation Age of Onset  . Anesthesia problems Neg Hx   . Hypotension Neg Hx   . Malignant hyperthermia Neg Hx   . Pseudochol deficiency Neg Hx    Social History   Socioeconomic History  . Marital status: Single    Spouse name: Not on file  . Number of children: Not on file  . Years of education: Not on file  . Highest education level: Not on file  Occupational History  . Not on file  Tobacco Use  . Smoking status: Current Every Day Smoker    Packs/day: 1.00    Years: 33.00    Pack years: 33.00    Types: Cigarettes  . Smokeless tobacco: Never Used  Vaping Use  . Vaping Use: Never used  Substance and Sexual Activity  . Alcohol use: No  . Drug use: No  . Sexual activity: Never     Comment: back problems  Other Topics Concern  . Not on file  Social History Narrative  . Not on file   Social Determinants of Health   Financial Resource Strain:   . Difficulty of Paying Living Expenses:   Food Insecurity:   . Worried About Charity fundraiser in the Last Year:   . Arboriculturist in the Last Year:   Transportation Needs:   . Film/video editor (Medical):   Marland Kitchen Lack of Transportation (Non-Medical):   Physical Activity:   . Days of Exercise per Week:   . Minutes of Exercise per Session:   Stress:   . Feeling of Stress :   Social Connections:   . Frequency of Communication with Friends and Family:   . Frequency of Social Gatherings with Friends and Family:   . Attends Religious Services:   . Active Member of Clubs or Organizations:   . Attends Archivist Meetings:   Marland Kitchen Marital Status:    Past Surgical History:  Procedure Laterality Date  . BACK SURGERY  2013   Back surgery  (fusion)09/15/2010 & 2013  . BACK SURGERY  03-2013  . HARDWARE REMOVAL N/A 03/14/2013   Procedure: HARDWARE REMOVAL;  Surgeon: Eustace Moore, MD;  Location: [facility];  Service:  Neurosurgery;  Laterality: N/A;   Past Medical History:  Diagnosis Date  . Goals of care, counseling/discussion 04/07/2016  . Iron deficiency anemia due to chronic blood loss 10/21/2016  . 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 No pertinent past medical history   . Occupational injury 2013   resulted in back injury that led to surgery  . Thyroid cancer (HCC)    BP (!) 148/75   Pulse 81   Temp 98.7 F (37.1 C)   Ht 5\' 6"  (1.676 m)   Wt 125 lb (56.7 kg)   SpO2 91%   BMI 20.18 kg/m   Opioid Risk Score:   Fall Risk Score:  `1  Depression screen PHQ 2/9  Depression screen [facility] 2/9 11/14/2018 02/16/2018 12/22/2017 10/24/2017 06/22/2017 04/20/2017 03/20/2017  Decreased Interest 0 0 0 0 0 0 0  Down, Depressed, Hopeless 0 0 0 0 0 0 0  PHQ - 2 Score 0 0 0 0 0 0 0  Altered sleeping - - - - - - -  Tired,  decreased energy - - - - - - -  Change in appetite - - - - - - -  Feeling bad or failure about yourself  - - - - - - -  Trouble concentrating - - - - - - -  Moving slowly or fidgety/restless - - - - - - -  Suicidal thoughts - - - - - - -  PHQ-9 Score - - - - - - -    Review of Systems  Constitutional: Negative.   HENT: Negative.   Eyes: Negative.   Respiratory: Negative.   Cardiovascular: Negative.   Gastrointestinal: Negative.   Endocrine: Negative.   Genitourinary: Negative.   Musculoskeletal: Positive for back pain.  Skin: Negative.   Allergic/Immunologic: Negative.   Neurological: Negative.   Hematological: Negative.   Psychiatric/Behavioral: Negative.   All other systems reviewed and are negative.      Objective:   Physical Exam Vitals and nursing note reviewed.  Constitutional:      Appearance: Normal appearance.  Cardiovascular:     Rate and Rhythm: Normal rate and regular rhythm.     Pulses: Normal pulses.     Heart sounds: Normal heart sounds.  Pulmonary:     Effort: Pulmonary effort is normal.     Breath sounds: Normal breath sounds.  Musculoskeletal:     Cervical back: Normal range of motion and neck supple.     Comments: Normal Muscle Bulk and Muscle Testing Reveals:  Upper Extremities: Full ROM and Muscle Strength 5/5  Thoracic Paraspinal Tenderness: T-2-T-4 Mainly Right Side Thoracic Hypersensitivity: T-7-T-12 Lumbar Hypersensitivity Lower Extremities: Full ROM and Muscle Strength 5/5 Arises from Table Slowly Antalgic Gait   Skin:    General: Skin is warm and dry.  Neurological:     Mental Status: He is alert and oriented to person, place, and time.  Psychiatric:        Mood and Affect: Mood normal.        Behavior: Behavior normal.           Assessment & Plan:  1. Lumbar postlaminectomy syndrome status post lumbar fusion x3: His most recent surgery was 03/14/2013. He had hardware removal and bone allograft waist and L2-3. Continue to  Monitor. Continue current Medication Regimen.08/29/2019 Refilled: oxyCODONE 7.5/325mg  one tablet every 4 hours as needed #100 [id number] mg one tablet every 12 hours #60. We will continue the opioid monitoring program, this consists of regular clinic visits, examinations, urine  drug screen, pill counts as well as use of [place] Controlled Substance Reporting System. 2. Lumbar Radiculopathy: Continuecurrent medication regimen withLyrica.08/29/2019 3. Insomnia: Continuecurrent medication regimen withTrazodone.08/29/2019 4. Nausea: Continue Zofran as needed.Oncology Following.08/29/2019 5. Cervical Spondylosis/ Cervicalgia/ Cervical Radiculitis: No complaints Today. Continuecurrent medication regimen withLyrica.08/29/2019 6. Cervical Myofascial Pain Syndrome: Continue current treatment regimen. Continue to monitor.08/29/2019.  20  minutes of face to face patient care time was spent during this visit. All questions were encouraged and answered.  F/U in 1 month

## 2019-09-09 ENCOUNTER — Other Ambulatory Visit: Payer: Self-pay | Admitting: Hematology & Oncology

## 2019-09-30 ENCOUNTER — Encounter: Payer: Medicare Other | Admitting: Registered Nurse

## 2019-10-01 ENCOUNTER — Encounter: Payer: Self-pay | Admitting: Registered Nurse

## 2019-10-01 ENCOUNTER — Encounter: Payer: Medicare Other | Attending: Physical Medicine and Rehabilitation | Admitting: Registered Nurse

## 2019-10-01 ENCOUNTER — Other Ambulatory Visit: Payer: Self-pay

## 2019-10-01 VITALS — BP 152/75 | HR 71 | Temp 97.7°F | Ht 66.0 in | Wt 124.6 lb

## 2019-10-01 DIAGNOSIS — Z5181 Encounter for therapeutic drug level monitoring: Secondary | ICD-10-CM

## 2019-10-01 DIAGNOSIS — M542 Cervicalgia: Secondary | ICD-10-CM | POA: Diagnosis not present

## 2019-10-01 DIAGNOSIS — Z79891 Long term (current) use of opiate analgesic: Secondary | ICD-10-CM | POA: Insufficient documentation

## 2019-10-01 DIAGNOSIS — G8929 Other chronic pain: Secondary | ICD-10-CM

## 2019-10-01 DIAGNOSIS — G894 Chronic pain syndrome: Secondary | ICD-10-CM | POA: Insufficient documentation

## 2019-10-01 DIAGNOSIS — M7918 Myalgia, other site: Secondary | ICD-10-CM | POA: Diagnosis not present

## 2019-10-01 DIAGNOSIS — Z79899 Other long term (current) drug therapy: Secondary | ICD-10-CM | POA: Diagnosis not present

## 2019-10-01 DIAGNOSIS — M5412 Radiculopathy, cervical region: Secondary | ICD-10-CM

## 2019-10-01 DIAGNOSIS — M546 Pain in thoracic spine: Secondary | ICD-10-CM

## 2019-10-01 DIAGNOSIS — M961 Postlaminectomy syndrome, not elsewhere classified: Secondary | ICD-10-CM | POA: Diagnosis not present

## 2019-10-01 DIAGNOSIS — Z9889 Other specified postprocedural states: Secondary | ICD-10-CM | POA: Diagnosis not present

## 2019-10-01 DIAGNOSIS — M5416 Radiculopathy, lumbar region: Secondary | ICD-10-CM | POA: Insufficient documentation

## 2019-10-01 MED ORDER — OXYCODONE-ACETAMINOPHEN 7.5-325 MG PO TABS: 1.0000 | ORAL_TABLET | ORAL | 0 refills | Status: DC | PRN

## 2019-10-01 MED ORDER — OXYCODONE HCL ER 20 MG PO T12A: 20.0000 mg | EXTENDED_RELEASE_TABLET | Freq: Two times a day (BID) | ORAL | 0 refills | Status: DC

## 2019-10-01 NOTE — Progress Notes (Signed)
Subjective:    Patient ID: Bruce Olson, male    DOB: 1956/01/30, 64 y.o.   MRN: [id number]  HPI: Bruce Olson is a 64 y.o. male who returns for follow up appointment for chronic pain and medication refill. He states his pain is located in his neck radiating into his right shoulder and upper- lower back pain. He rates his pain 7. His current exercise regime is walking and performing stretching exercises.  Bruce Olson Morphine equivalent is 105.00  MME.    Oral Swab was Performed today.    Pain Inventory Average Pain 7 Pain Right Now 7 My pain is sharp, stabbing and tingling  In the last 24 hours, has pain interfered with the following? General activity 7 Relation with others 7 Enjoyment of life 7 What TIME of day is your pain at its worst? morning , daytime, evening and night Sleep (in general) Poor  Pain is worse with: walking, sitting and standing Pain improves with: heat/ice, medication and TENS Relief from Meds: 3  Family History  Problem Relation Age of Onset  . Anesthesia problems Neg Hx   . Hypotension Neg Hx   . Malignant hyperthermia Neg Hx   . Pseudochol deficiency Neg Hx    Social History   Socioeconomic History  . Marital status: Single    Spouse name: Not on file  . Number of children: Not on file  . Years of education: Not on file  . Highest education level: Not on file  Occupational History  . Not on file  Tobacco Use  . Smoking status: Current Every Day Smoker    Packs/day: 1.00    Years: 33.00    Pack years: 33.00    Types: Cigarettes  . Smokeless tobacco: Never Used  Vaping Use  . Vaping Use: Never used  Substance and Sexual Activity  . Alcohol use: No  . Drug use: No  . Sexual activity: Never    Comment: back problems  Other Topics Concern  . Not on file  Social History Narrative  . Not on file   Social Determinants of Health   Financial Resource Strain:   . Difficulty of Paying Living Expenses: Not on file  Food  Insecurity:   . Worried About Charity fundraiser in the Last Year: Not on file  . Ran Out of Food in the Last Year: Not on file  Transportation Needs:   . Lack of Transportation (Medical): Not on file  . Lack of Transportation (Non-Medical): Not on file  Physical Activity:   . Days of Exercise per Week: Not on file  . Minutes of Exercise per Session: Not on file  Stress:   . Feeling of Stress : Not on file  Social Connections:   . Frequency of Communication with Friends and Family: Not on file  . Frequency of Social Gatherings with Friends and Family: Not on file  . Attends Religious Services: Not on file  . Active Member of Clubs or Organizations: Not on file  . Attends Archivist Meetings: Not on file  . Marital Status: Not on file   Past Surgical History:  Procedure Laterality Date  . BACK SURGERY  2013   Back surgery  (fusion)09/15/2010 & 2013  . BACK SURGERY  03-2013  . HARDWARE REMOVAL N/A 03/14/2013   Procedure: HARDWARE REMOVAL;  Surgeon: Eustace Moore, MD;  Location: [place] [facility];  Service: Neurosurgery;  Laterality: N/A;   Past Surgical History:  Procedure Laterality  Date  . BACK SURGERY  2013   Back surgery  (fusion)09/15/2010 & 2013  . BACK SURGERY  03-2013  . HARDWARE REMOVAL N/A 03/14/2013   Procedure: HARDWARE REMOVAL;  Surgeon: Eustace Moore, MD;  Location: [place] [facility];  Service: Neurosurgery;  Laterality: N/A;   Past Medical History:  Diagnosis Date  . Goals of care, counseling/discussion 04/07/2016  . Iron deficiency anemia due to chronic blood loss 10/21/2016  . 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 No pertinent past medical history   . Occupational injury 2013   resulted in back injury that led to surgery  . Thyroid cancer (HCC)    BP (!) 152/75   Pulse 71   Temp 97.7 F (36.5 C)   Ht 5\' 6"  (1.676 m)   Wt 124 lb 9.6 oz (56.5 kg)   SpO2 93%   BMI 20.11 kg/m   Opioid Risk Score:   Fall Risk Score:  `1  Depression screen PHQ 2/9  Depression  screen [facility] 2/9 11/14/2018 02/16/2018 12/22/2017 10/24/2017 06/22/2017 04/20/2017 03/20/2017  Decreased Interest 0 0 0 0 0 0 0  Down, Depressed, Hopeless 0 0 0 0 0 0 0  PHQ - 2 Score 0 0 0 0 0 0 0  Altered sleeping - - - - - - -  Tired, decreased energy - - - - - - -  Change in appetite - - - - - - -  Feeling bad or failure about yourself  - - - - - - -  Trouble concentrating - - - - - - -  Moving slowly or fidgety/restless - - - - - - -  Suicidal thoughts - - - - - - -  PHQ-9 Score - - - - - - -    Review of Systems  Musculoskeletal: Positive for back pain and gait problem.  All other systems reviewed and are negative.      Objective:   Physical Exam Vitals and nursing note reviewed.  Constitutional:      Appearance: Normal appearance.  Cardiovascular:     Rate and Rhythm: Normal rate and regular rhythm.     Pulses: Normal pulses.     Heart sounds: Normal heart sounds.  Pulmonary:     Effort: Pulmonary effort is normal.     Breath sounds: Normal breath sounds.  Musculoskeletal:     Cervical back: Normal range of motion and neck supple.     Comments: Normal Muscle Bulk and Muscle Testing Reveals:  Upper Extremities: Full ROM and Muscle Strength 5/5 Right AC Joint Tenderness  Thoracic and Lumbar Hypersensitivity Lower Extremities: Full ROM and Muscle Strength 5/5 Arises from Table with ease Narrow based  Gait   Skin:    General: Skin is warm and dry.  Neurological:     Mental Status: He is alert and oriented to person, place, and time.  Psychiatric:        Mood and Affect: Mood normal.        Behavior: Behavior normal.           Assessment & Plan:  1. Lumbar postlaminectomy syndrome status post lumbar fusion x3: His most recent surgery was 03/14/2013. He had hardware removal and bone allograft waist and L2-3. Continue to Monitor. Continue current Medication Regimen.10/01/2019 Refilled: oxyCODONE 7.5/325mg  one tablet every 4 hours as needed #100 [id number] mg  one tablet every 12 hours #60. We will continue the opioid monitoring program, this consists of regular clinic visits, examinations, urine drug screen, pill  counts as well as use of [place] Controlled Substance Reporting system. A 12 month History has been reviewed on the [place] on 10/01/2019.  2. Lumbar Radiculopathy: Continuecurrent medication regimen withLyrica.10/01/2019 3. Insomnia: Continuecurrent medication regimen withTrazodone.10/01/2019 4. Nausea: Continue Zofran as needed.Oncology Following.10/01/2019 5. Cervical Spondylosis/ Cervicalgia/ Cervical Radiculitis:   Continuecurrent medication regimen withLyrica.10/01/2019 6. Cervical Myofascial Pain Syndrome: Continue current treatment regimen. Continue to monitor.10/01/2019.  20  minutes of face to face patient care time was spent during this visit. All questions were encouraged and answered.  F/U in 1 month

## 2019-10-04 LAB — DRUG TOX MONITOR 1 W/CONF, ORAL FLD
Amphetamines: NEGATIVE ng/mL (ref <10)
Barbiturates: NEGATIVE ng/mL (ref <10)
Benzodiazepines: NEGATIVE ng/mL (ref <0.50)
Buprenorphine: NEGATIVE ng/mL (ref <0.10)
Cocaine: NEGATIVE ng/mL (ref <5.0)
Codeine: NEGATIVE ng/mL (ref <2.5)
Cotinine: 88.7 ng/mL — ABNORMAL HIGH (ref <5.0)
Dihydrocodeine: NEGATIVE ng/mL (ref <2.5)
Fentanyl: NEGATIVE ng/mL (ref <0.10)
Heroin Metabolite: NEGATIVE ng/mL (ref <1.0)
Hydrocodone: NEGATIVE ng/mL (ref <2.5)
Hydromorphone: NEGATIVE ng/mL (ref <2.5)
MARIJUANA: NEGATIVE ng/mL (ref <2.5)
MDMA: NEGATIVE ng/mL (ref <10)
Meprobamate: NEGATIVE ng/mL (ref <2.5)
Methadone: NEGATIVE ng/mL (ref <5.0)
Morphine: NEGATIVE ng/mL (ref <2.5)
Nicotine Metabolite: POSITIVE ng/mL — AB (ref <5.0)
Norhydrocodone: NEGATIVE ng/mL (ref <2.5)
Noroxycodone: 44.4 ng/mL — ABNORMAL HIGH (ref <2.5)
Opiates: POSITIVE ng/mL — AB (ref <2.5)
Oxycodone: 86 ng/mL — ABNORMAL HIGH (ref <2.5)
Oxymorphone: NEGATIVE ng/mL (ref <2.5)
Phencyclidine: NEGATIVE ng/mL (ref <10)
Tapentadol: NEGATIVE ng/mL (ref <5.0)
Tramadol: NEGATIVE ng/mL (ref <5.0)
Zolpidem: NEGATIVE ng/mL (ref <5.0)

## 2019-10-04 LAB — DRUG TOX ALC METAB W/CON, ORAL FLD: Alcohol Metabolite: NEGATIVE ng/mL (ref <25)

## 2019-10-09 ENCOUNTER — Telehealth: Payer: Self-pay | Admitting: *Deleted

## 2019-10-09 NOTE — Telephone Encounter (Signed)
Oral swab drug screen was consistent for prescribed medications.  ?

## 2019-10-14 ENCOUNTER — Telehealth: Payer: Self-pay | Admitting: Hematology & Oncology

## 2019-10-14 NOTE — Telephone Encounter (Signed)
Patient called in requesting to move his 10/4 appointments to later in the day. No available time for appt.  We were able to move it to 10/6 later in the afternoon.  He was ok with this date/time change

## 2019-10-31 ENCOUNTER — Encounter: Payer: Self-pay | Admitting: Registered Nurse

## 2019-10-31 ENCOUNTER — Encounter: Payer: Medicare Other | Attending: Physical Medicine and Rehabilitation | Admitting: Registered Nurse

## 2019-10-31 ENCOUNTER — Other Ambulatory Visit: Payer: Self-pay

## 2019-10-31 VITALS — BP 146/88 | HR 79 | Temp 97.7°F | Ht 66.0 in | Wt 125.6 lb

## 2019-10-31 DIAGNOSIS — M546 Pain in thoracic spine: Secondary | ICD-10-CM | POA: Diagnosis not present

## 2019-10-31 DIAGNOSIS — Z5181 Encounter for therapeutic drug level monitoring: Secondary | ICD-10-CM | POA: Diagnosis not present

## 2019-10-31 DIAGNOSIS — M961 Postlaminectomy syndrome, not elsewhere classified: Secondary | ICD-10-CM | POA: Insufficient documentation

## 2019-10-31 DIAGNOSIS — M5416 Radiculopathy, lumbar region: Secondary | ICD-10-CM | POA: Diagnosis not present

## 2019-10-31 DIAGNOSIS — G8929 Other chronic pain: Secondary | ICD-10-CM | POA: Diagnosis not present

## 2019-10-31 DIAGNOSIS — M7918 Myalgia, other site: Secondary | ICD-10-CM | POA: Diagnosis not present

## 2019-10-31 DIAGNOSIS — Z79899 Other long term (current) drug therapy: Secondary | ICD-10-CM | POA: Insufficient documentation

## 2019-10-31 DIAGNOSIS — Z79891 Long term (current) use of opiate analgesic: Secondary | ICD-10-CM | POA: Diagnosis not present

## 2019-10-31 DIAGNOSIS — Z9889 Other specified postprocedural states: Secondary | ICD-10-CM | POA: Insufficient documentation

## 2019-10-31 DIAGNOSIS — G894 Chronic pain syndrome: Secondary | ICD-10-CM | POA: Insufficient documentation

## 2019-10-31 DIAGNOSIS — M542 Cervicalgia: Secondary | ICD-10-CM | POA: Diagnosis not present

## 2019-10-31 DIAGNOSIS — M5412 Radiculopathy, cervical region: Secondary | ICD-10-CM

## 2019-10-31 MED ORDER — OXYCODONE-ACETAMINOPHEN 7.5-325 MG PO TABS: 1.0000 | ORAL_TABLET | ORAL | 0 refills | Status: DC | PRN

## 2019-10-31 MED ORDER — OXYCODONE HCL ER 20 MG PO T12A: 20.0000 mg | EXTENDED_RELEASE_TABLET | Freq: Two times a day (BID) | ORAL | 0 refills | Status: DC

## 2019-10-31 NOTE — Progress Notes (Signed)
Subjective:    Patient ID: Bruce Olson, male    DOB: [birth year], 64 y.o.   MRN: [id number]  HPI: Bruce Olson is a 64 y.o. male who returns for follow up appointment for chronic pain and medication refill. He states his pain is located in his neck radiating into his right shoulder and mid- lower back pain. He rates his pain 7. His current exercise regime is walking and performing stretching exercises.  Mr. Pola Morphine equivalent is 105.00 MME.  Last Oral Swab was Performed on 10/01/2019, it was consistent.   Pain Inventory Average Pain 7 Pain Right Now 7 My pain is sharp, stabbing and tingling  In the last 24 hours, has pain interfered with the following? General activity 7 Relation with others 7 Enjoyment of life 7 What TIME of day is your pain at its worst? morning , daytime, evening and night Sleep (in general) Poor  Pain is worse with: walking, sitting and standing Pain improves with: heat/ice, medication and TENS Relief from Meds: 3  Family History  Problem Relation Age of Onset  . Anesthesia problems Neg Hx   . Hypotension Neg Hx   . Malignant hyperthermia Neg Hx   . Pseudochol deficiency Neg Hx    Social History   Socioeconomic History  . Marital status: Single    Spouse name: Not on file  . Number of children: Not on file  . Years of education: Not on file  . Highest education level: Not on file  Occupational History  . Not on file  Tobacco Use  . Smoking status: Current Every Day Smoker    Packs/day: 1.00    Years: 33.00    Pack years: 33.00    Types: Cigarettes  . Smokeless tobacco: Never Used  Vaping Use  . Vaping Use: Never used  Substance and Sexual Activity  . Alcohol use: No  . Drug use: No  . Sexual activity: Never    Comment: back problems  Other Topics Concern  . Not on file  Social History Narrative  . Not on file   Social Determinants of Health   Financial Resource Strain:   . Difficulty of Paying Living  Expenses: Not on file  Food Insecurity:   . Worried About Charity fundraiser in the Last Year: Not on file  . Ran Out of Food in the Last Year: Not on file  Transportation Needs:   . Lack of Transportation (Medical): Not on file  . Lack of Transportation (Non-Medical): Not on file  Physical Activity:   . Days of Exercise per Week: Not on file  . Minutes of Exercise per Session: Not on file  Stress:   . Feeling of Stress : Not on file  Social Connections:   . Frequency of Communication with Friends and Family: Not on file  . Frequency of Social Gatherings with Friends and Family: Not on file  . Attends Religious Services: Not on file  . Active Member of Clubs or Organizations: Not on file  . Attends Archivist Meetings: Not on file  . Marital Status: Not on file   Past Surgical History:  Procedure Laterality Date  . BACK SURGERY  2013   Back surgery  (fusion)09/15/2010 & 2013  . BACK SURGERY  03-2013  . HARDWARE REMOVAL N/A 03/14/2013   Procedure: HARDWARE REMOVAL;  Surgeon: Eustace Moore, MD;  Location: [place] [facility];  Service: Neurosurgery;  Laterality: N/A;   Past Surgical History:  Procedure  Laterality Date  . BACK SURGERY  2013   Back surgery  (fusion)09/15/2010 & 2013  . BACK SURGERY  03-2013  . HARDWARE REMOVAL N/A 03/14/2013   Procedure: HARDWARE REMOVAL;  Surgeon: Eustace Moore, MD;  Location: [place] [facility];  Service: Neurosurgery;  Laterality: N/A;   Past Medical History:  Diagnosis Date  . Goals of care, counseling/discussion 04/07/2016  . Iron deficiency anemia due to chronic blood loss 10/21/2016  . 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 No pertinent past medical history   . Occupational injury 2013   resulted in back injury that led to surgery  . Thyroid cancer (HCC)    BP (!) 146/88   Pulse 79   Temp 97.7 F (36.5 C)   Ht 5\' 6"  (1.676 m)   Wt 125 lb 9.6 oz (57 kg)   SpO2 93%   BMI 20.27 kg/m   Opioid Risk Score:   Fall Risk Score:  `1  Depression  screen PHQ 2/9  Depression screen [facility] 2/9 11/14/2018 02/16/2018 12/22/2017 10/24/2017 06/22/2017 04/20/2017 03/20/2017  Decreased Interest 0 0 0 0 0 0 0  Down, Depressed, Hopeless 0 0 0 0 0 0 0  PHQ - 2 Score 0 0 0 0 0 0 0  Altered sleeping - - - - - - -  Tired, decreased energy - - - - - - -  Change in appetite - - - - - - -  Feeling bad or failure about yourself  - - - - - - -  Trouble concentrating - - - - - - -  Moving slowly or fidgety/restless - - - - - - -  Suicidal thoughts - - - - - - -  PHQ-9 Score - - - - - - -    Review of Systems  Musculoskeletal: Positive for back pain.  All other systems reviewed and are negative.      Objective:   Physical Exam Vitals and nursing note reviewed.  Constitutional:      Appearance: Normal appearance.  Neck:     Comments: Cervical Paraspinal Tenderness: C-5-C-6 Cardiovascular:     Rate and Rhythm: Normal rate and regular rhythm.     Pulses: Normal pulses.     Heart sounds: Normal heart sounds.  Pulmonary:     Breath sounds: Rhonchi present.  Musculoskeletal:     Cervical back: Normal range of motion and neck supple.     Comments: Normal Muscle Bulk and Muscle Testing Reveals:  Upper Extremities: Full ROM and Muscle Strength 5/5 Right AC Joint Tenderness  Thoracic Paraspinal Tenderness: T-1-T-3 Mainly Right Side Lumbar Hypersensitivity Lower Extremities: Full ROM and Muscle Strength 5/5 Arises from Chair with ease Narrow Based  Gait   Skin:    General: Skin is warm and dry.  Neurological:     Mental Status: He is alert and oriented to person, place, and time.  Psychiatric:        Mood and Affect: Mood normal.        Behavior: Behavior normal.           Assessment & Plan:  1. Lumbar postlaminectomy syndrome status post lumbar fusion x3: His most recent surgery was 03/14/2013. He had hardware removal and bone allograft waist and L2-3. Continue to Monitor. Continue current Medication Regimen.10/31/2019 Refilled:  oxyCODONE 7.5/325mg  one tablet every 4 hours as needed #100 [id number] mg one tablet every 12 hours #60. We will continue the opioid monitoring program, this consists of regular clinic visits, examinations, urine  drug screen, pill counts as well as use of [place] Controlled Substance Reporting system. A 12 month History has been reviewed on the [place] on 10/31/2019.  2. Lumbar Radiculopathy: Continuecurrent medication regimen withLyrica.10/31/2019 3. Insomnia: Continuecurrent medication regimen withTrazodone.11/30/2019 4. Nausea: Continue Zofran as needed.Oncology Following.10/31/2019 5. Cervical Spondylosis/ Cervicalgia/ Cervical Radiculitis: Continuecurrent medication regimen withLyrica.10/31/2019 6. Cervical Myofascial Pain Syndrome: Continue current treatment regimen. Continue to monitor.10/31/2019.  20 minutes of face to face patient care time was spent during this visit. All questions were encouraged and answered.  F/U in 1 month

## 2019-11-04 ENCOUNTER — Other Ambulatory Visit: Payer: Medicare Other

## 2019-11-04 ENCOUNTER — Ambulatory Visit: Payer: Medicare Other | Admitting: Family

## 2019-11-06 ENCOUNTER — Inpatient Hospital Stay: Payer: Medicare Other | Attending: Hematology & Oncology

## 2019-11-06 ENCOUNTER — Inpatient Hospital Stay (HOSPITAL_BASED_OUTPATIENT_CLINIC_OR_DEPARTMENT_OTHER): Payer: Medicare Other | Admitting: Family

## 2019-11-06 ENCOUNTER — Telehealth: Payer: Self-pay | Admitting: Family

## 2019-11-06 ENCOUNTER — Other Ambulatory Visit: Payer: Self-pay

## 2019-11-06 ENCOUNTER — Encounter: Payer: Self-pay | Admitting: Family

## 2019-11-06 VITALS — BP 148/67 | HR 68 | Temp 98.1°F | Resp 17 | Ht 66.0 in | Wt 125.8 lb

## 2019-11-06 DIAGNOSIS — E034 Atrophy of thyroid (acquired): Secondary | ICD-10-CM

## 2019-11-06 DIAGNOSIS — C090 Malignant neoplasm of tonsillar fossa: Secondary | ICD-10-CM | POA: Diagnosis not present

## 2019-11-06 DIAGNOSIS — R5383 Other fatigue: Secondary | ICD-10-CM | POA: Diagnosis not present

## 2019-11-06 DIAGNOSIS — D500 Iron deficiency anemia secondary to blood loss (chronic): Secondary | ICD-10-CM

## 2019-11-06 DIAGNOSIS — Z923 Personal history of irradiation: Secondary | ICD-10-CM | POA: Insufficient documentation

## 2019-11-06 DIAGNOSIS — Z85818 Personal history of malignant neoplasm of other sites of lip, oral cavity, and pharynx: Secondary | ICD-10-CM | POA: Diagnosis not present

## 2019-11-06 DIAGNOSIS — E611 Iron deficiency: Secondary | ICD-10-CM | POA: Diagnosis not present

## 2019-11-06 DIAGNOSIS — Z9221 Personal history of antineoplastic chemotherapy: Secondary | ICD-10-CM | POA: Insufficient documentation

## 2019-11-06 LAB — CBC WITH DIFFERENTIAL (CANCER CENTER ONLY)
Abs Immature Granulocytes: 0 10*3/uL (ref 0.00–0.07)
Basophils Absolute: 0.1 10*3/uL (ref 0.0–0.1)
Basophils Relative: 1 %
Eosinophils Absolute: 0.1 10*3/uL (ref 0.0–0.5)
Eosinophils Relative: 2 %
HCT: 38.2 % — ABNORMAL LOW (ref 39.0–52.0)
Hemoglobin: 12.9 g/dL — ABNORMAL LOW (ref 13.0–17.0)
Immature Granulocytes: 0 %
Lymphocytes Relative: 25 %
Lymphs Abs: 1.5 10*3/uL (ref 0.7–4.0)
MCH: 30.7 pg (ref 26.0–34.0)
MCHC: 33.8 g/dL (ref 30.0–36.0)
MCV: 91 fL (ref 80.0–100.0)
Monocytes Absolute: 0.8 10*3/uL (ref 0.1–1.0)
Monocytes Relative: 14 %
Neutro Abs: 3.4 10*3/uL (ref 1.7–7.7)
Neutrophils Relative %: 58 %
Platelet Count: 216 10*3/uL (ref 150–400)
RBC: 4.2 MIL/uL — ABNORMAL LOW (ref 4.22–5.81)
RDW: 12.6 % (ref 11.5–15.5)
WBC Count: 5.8 10*3/uL (ref 4.0–10.5)
nRBC: 0 % (ref 0.0–0.2)

## 2019-11-06 LAB — CMP (CANCER CENTER ONLY)
ALT: 18 U/L (ref 0–44)
AST: 17 U/L (ref 15–41)
Albumin: 4.2 g/dL (ref 3.5–5.0)
Alkaline Phosphatase: 58 U/L (ref 38–126)
Anion gap: 5 (ref 5–15)
BUN: 17 mg/dL (ref 8–23)
CO2: 29 mmol/L (ref 22–32)
Calcium: 9.7 mg/dL (ref 8.9–10.3)
Chloride: 103 mmol/L (ref 98–111)
Creatinine: 0.98 mg/dL (ref 0.61–1.24)
GFR, Estimated: >60 mL/min (ref >60)
Glucose, Bld: 82 mg/dL (ref 70–99)
Potassium: 4.3 mmol/L (ref 3.5–5.1)
Sodium: 137 mmol/L (ref 135–145)
Total Bilirubin: 0.3 mg/dL (ref 0.3–1.2)
Total Protein: 6.8 g/dL (ref 6.5–8.1)

## 2019-11-06 NOTE — Telephone Encounter (Signed)
Appointments scheduled calendar printed & mailed per 10/6 los

## 2019-11-06 NOTE — Progress Notes (Signed)
Hematology and Oncology Follow Up Visit  Bruce Olson [id number] 04-19-1955 64 y.o. 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 He is doing quite well and will be going to the beach this weekend.  He feels fatigued at times and will take a break to rest when needed.  He has some mild SOB with over exertion and is still smoking 1 ppd. He is interested in assistance quitting (Chantix) but unfortunately his insurance company will not help him pay for it.  No fever, chills, n/v, cough, rash, dizziness, chest pain, palpitations or changes in bowel or bladder habits.  He still has twinges of pain in the left lower abdomen from where he previously had the feeding tube.  No episodes of bleeding. No bruising or petechiae.  No swelling, tenderness, numbness or tingling in his extremities.  No falls or syncope.  He has a good appetite and is careful to eat smaller portions. He denies any problems with swallowing or choking. He is hydrating well and his weight is stable at 125 lbs.   ECOG Performance Status: 1 - Symptomatic but completely ambulatory  Medications:  Allergies as of 11/06/2019   No Known Allergies     Medication List       Accurate as of November 06, 2019  1:59 PM. If you have any questions, ask your nurse or doctor.        docusate sodium 100 MG capsule Commonly known as: COLACE Take 100 mg by mouth daily.   dronabinol 2.5 MG capsule Commonly known as: MARINOL Take 1 capsule (2.5 mg total) by mouth 2 (two) times daily before lunch and supper.   metoCLOPramide 10 MG tablet Commonly known as: REGLAN TAKE 1 TABLET BY MOUTH 4 TIMES DAILY   multivitamin with minerals Tabs tablet Take 1 tablet by mouth daily.   OLANZapine 10 MG  tablet Commonly known as: ZYPREXA TAKE 1 TABLET BY MOUTH EVERY NIGHT AT BEDTIME   oxyCODONE 20 mg 12 hr tablet Commonly known as: OxyCONTIN Take 1 tablet (20 mg total) by mouth every 12 (twelve) hours. Do Not Fill Before 11/01/2019   oxyCODONE-acetaminophen 7.5-325 MG tablet Commonly known as: Percocet Take 1 tablet by mouth every 4 (four) hours as needed. May take one extra tablet when pain is sever. No more than 4 a day. Do Not Fill Before 11/01/2019   pregabalin 100 MG capsule Commonly known as: LYRICA TAKE 1 CAPSULE BY MOUTH 3 TIMES DAILY       Allergies: No Known Allergies  Past Medical History, Surgical history, Social history, and Family History were reviewed and updated.  Review of Systems: All other 10 point review of systems is negative.   Physical Exam:  vitals were not taken for this visit.   Wt Readings from Last 3 Encounters:  10/31/19 125 lb 9.6 oz (57 kg)  10/01/19 124 lb 9.6 oz (56.5 kg)  08/29/19 125 lb (56.7 kg)    Ocular: Sclerae unicteric, pupils equal, round and reactive to light Ear-nose-throat: Oropharynx clear, dentition fair Lymphatic: No cervical or supraclavicular adenopathy Lungs no rales or rhonchi, good excursion bilaterally Heart regular rate and rhythm, no murmur appreciated Abd soft, nontender, positive bowel sounds MSK no focal spinal tenderness, no joint edema Neuro: non-focal, well-oriented, appropriate affect Breasts: Deferred  Lab Results  Component Value Date   WBC 6.2 07/05/2019   HGB 12.7 (L) 07/05/2019   HCT 37.7 (L) 07/05/2019   MCV 89.8 07/05/2019   PLT 211 07/05/2019   Lab Results  Component Value Date   FERRITIN 160 07/05/2019   IRON 38 (L) 07/05/2019   TIBC 290 07/05/2019   UIBC 252 07/05/2019   IRONPCTSAT 13 (L) 07/05/2019   Lab Results  Component Value Date   RBC 4.20 (L) 07/05/2019   No results found for: KPAFRELGTCHN, LAMBDASER, KAPLAMBRATIO No results found for: IGGSERUM, IGA, IGMSERUM No results  found for: Odetta Pink, SPEI   Chemistry      Component Value Date/Time   NA 135 07/05/2019 1251   NA 138 01/20/2017 1324   NA 135 (L) 04/05/2016 1333   K 4.2 07/05/2019 1251   K 4.5 01/20/2017 1324   K 4.7 04/05/2016 1333   CL 101 07/05/2019 1251   CL 97 (L) 01/20/2017 1324   CO2 29 07/05/2019 1251   CO2 30 01/20/2017 1324   CO2 29 04/05/2016 1333   BUN 16 07/05/2019 1251   BUN 15 01/20/2017 1324   BUN 23.6 04/05/2016 1333   CREATININE 1.00 07/05/2019 1251   CREATININE 1.2 01/20/2017 1324   CREATININE 1.1 04/05/2016 1333      Component Value Date/Time   CALCIUM 9.2 07/05/2019 1251   CALCIUM 9.2 01/20/2017 1324   CALCIUM 9.2 04/05/2016 1333   ALKPHOS 63 07/05/2019 1251   ALKPHOS 51 01/20/2017 1324   ALKPHOS 67 04/05/2016 1333   AST 18 07/05/2019 1251   AST 17 04/05/2016 1333   ALT 17 07/05/2019 1251   ALT 26 01/20/2017 1324   ALT 10 04/05/2016 1333   BILITOT 0.3 07/05/2019 1251   BILITOT 0.30 04/05/2016 1333       Impression and Plan: Bruce Olson is a very pleasant 64yo caucasian gentleman with history of squamous cell carcinoma of the left tonsil, HPV positive. He completed chemo/radiation therapy with [facility] in August 2017. He continues to do well and so far there has been no evidence of recurrence.  Follow-up in 6 months.  He can contact our office with any questions or concerns. We can certainly see him sooner if needed.   Laverna Peace, NP 10/6/20211:59 PM

## 2019-11-07 LAB — IRON AND TIBC
Iron: 41 ug/dL — ABNORMAL LOW (ref 42–163)
Saturation Ratios: 14 % — ABNORMAL LOW (ref 20–55)
TIBC: 291 ug/dL (ref 202–409)
UIBC: 250 ug/dL (ref 117–376)

## 2019-11-07 LAB — TSH: TSH: 2.964 u[IU]/mL (ref 0.320–4.118)

## 2019-11-07 LAB — FERRITIN: Ferritin: 170 ng/mL (ref 24–336)

## 2019-11-08 ENCOUNTER — Other Ambulatory Visit: Payer: Self-pay | Admitting: Family

## 2019-11-14 ENCOUNTER — Ambulatory Visit: Payer: Medicare Other

## 2019-11-21 ENCOUNTER — Other Ambulatory Visit: Payer: Self-pay

## 2019-11-21 ENCOUNTER — Inpatient Hospital Stay: Payer: Medicare Other

## 2019-11-21 VITALS — BP 163/77 | HR 84 | Temp 97.9°F | Resp 17

## 2019-11-21 DIAGNOSIS — E611 Iron deficiency: Secondary | ICD-10-CM | POA: Diagnosis not present

## 2019-11-21 DIAGNOSIS — K909 Intestinal malabsorption, unspecified: Secondary | ICD-10-CM

## 2019-11-21 DIAGNOSIS — D500 Iron deficiency anemia secondary to blood loss (chronic): Secondary | ICD-10-CM

## 2019-11-21 DIAGNOSIS — Z85818 Personal history of malignant neoplasm of other sites of lip, oral cavity, and pharynx: Secondary | ICD-10-CM | POA: Diagnosis not present

## 2019-11-21 DIAGNOSIS — R5383 Other fatigue: Secondary | ICD-10-CM | POA: Diagnosis not present

## 2019-11-21 DIAGNOSIS — Z923 Personal history of irradiation: Secondary | ICD-10-CM | POA: Diagnosis not present

## 2019-11-21 DIAGNOSIS — Z9221 Personal history of antineoplastic chemotherapy: Secondary | ICD-10-CM | POA: Diagnosis not present

## 2019-11-21 MED ORDER — SODIUM CHLORIDE 0.9 % IV SOLN
Freq: Once | INTRAVENOUS | Status: AC
  Filled 2019-11-21: qty 250

## 2019-11-21 MED ORDER — SODIUM CHLORIDE 0.9 % IV SOLN
510.0000 mg | Freq: Once | INTRAVENOUS | Status: AC
  Administered 2019-11-21: 510 mg via INTRAVENOUS
  Filled 2019-11-21: qty 510

## 2019-11-21 NOTE — Patient Instructions (Signed)

## 2019-11-21 NOTE — Progress Notes (Signed)
Pt discharged in no apparent distress. Pt left ambulatory without assistance. Pt aware of discharge instructions and verbalized understanding and had no further questions.  

## 2019-11-28 ENCOUNTER — Inpatient Hospital Stay: Payer: Medicare Other

## 2019-11-28 ENCOUNTER — Other Ambulatory Visit: Payer: Self-pay

## 2019-11-28 VITALS — BP 128/65 | HR 70 | Temp 98.1°F | Resp 19

## 2019-11-28 DIAGNOSIS — Z923 Personal history of irradiation: Secondary | ICD-10-CM | POA: Diagnosis not present

## 2019-11-28 DIAGNOSIS — Z85818 Personal history of malignant neoplasm of other sites of lip, oral cavity, and pharynx: Secondary | ICD-10-CM | POA: Diagnosis not present

## 2019-11-28 DIAGNOSIS — D500 Iron deficiency anemia secondary to blood loss (chronic): Secondary | ICD-10-CM

## 2019-11-28 DIAGNOSIS — E611 Iron deficiency: Secondary | ICD-10-CM | POA: Diagnosis not present

## 2019-11-28 DIAGNOSIS — R5383 Other fatigue: Secondary | ICD-10-CM | POA: Diagnosis not present

## 2019-11-28 DIAGNOSIS — Z9221 Personal history of antineoplastic chemotherapy: Secondary | ICD-10-CM | POA: Diagnosis not present

## 2019-11-28 DIAGNOSIS — K909 Intestinal malabsorption, unspecified: Secondary | ICD-10-CM

## 2019-11-28 MED ORDER — SODIUM CHLORIDE 0.9 % IV SOLN
510.0000 mg | Freq: Once | INTRAVENOUS | Status: AC
  Administered 2019-11-28: 510 mg via INTRAVENOUS
  Filled 2019-11-28: qty 510

## 2019-11-28 NOTE — Progress Notes (Signed)
Pt discharged in no apparent distress. Pt left ambulatory without assistance. Pt aware of discharge instructions and verbalized understanding and had no further questions.  

## 2019-11-29 ENCOUNTER — Other Ambulatory Visit: Payer: Self-pay

## 2019-11-29 ENCOUNTER — Encounter: Payer: Self-pay | Admitting: Registered Nurse

## 2019-11-29 ENCOUNTER — Encounter: Payer: Medicare Other | Attending: Physical Medicine and Rehabilitation | Admitting: Registered Nurse

## 2019-11-29 VITALS — BP 141/81 | HR 82 | Temp 98.2°F | Ht 67.0 in | Wt 125.0 lb

## 2019-11-29 DIAGNOSIS — G894 Chronic pain syndrome: Secondary | ICD-10-CM | POA: Insufficient documentation

## 2019-11-29 DIAGNOSIS — M961 Postlaminectomy syndrome, not elsewhere classified: Secondary | ICD-10-CM | POA: Insufficient documentation

## 2019-11-29 DIAGNOSIS — Z5181 Encounter for therapeutic drug level monitoring: Secondary | ICD-10-CM | POA: Diagnosis not present

## 2019-11-29 DIAGNOSIS — M542 Cervicalgia: Secondary | ICD-10-CM | POA: Insufficient documentation

## 2019-11-29 DIAGNOSIS — G8929 Other chronic pain: Secondary | ICD-10-CM | POA: Diagnosis not present

## 2019-11-29 DIAGNOSIS — Z9889 Other specified postprocedural states: Secondary | ICD-10-CM | POA: Insufficient documentation

## 2019-11-29 DIAGNOSIS — M7918 Myalgia, other site: Secondary | ICD-10-CM | POA: Diagnosis not present

## 2019-11-29 DIAGNOSIS — M546 Pain in thoracic spine: Secondary | ICD-10-CM

## 2019-11-29 DIAGNOSIS — Z79891 Long term (current) use of opiate analgesic: Secondary | ICD-10-CM | POA: Diagnosis not present

## 2019-11-29 DIAGNOSIS — M5412 Radiculopathy, cervical region: Secondary | ICD-10-CM | POA: Diagnosis not present

## 2019-11-29 MED ORDER — OXYCODONE-ACETAMINOPHEN 7.5-325 MG PO TABS: 1.0000 | ORAL_TABLET | ORAL | 0 refills | Status: DC | PRN

## 2019-11-29 MED ORDER — OXYCODONE HCL ER 20 MG PO T12A
20.0000 mg | EXTENDED_RELEASE_TABLET | Freq: Two times a day (BID) | ORAL | 0 refills | Status: DC
Start: 2019-11-29 — End: 2019-12-31

## 2019-11-29 NOTE — Progress Notes (Signed)
Subjective:    Patient ID: Bruce Olson, male    DOB: 1955/02/25, 64 y.o.   MRN: [id number]  HPI: Bruce Olson is a 65 y.o. male who returns for follow up appointment for chronic pain and medication refill. He states his pain is located in his neck radiating into his right shoulder and  mid- lower back pain. He rates his pain 7. His current exercise regime is walking and performing stretching exercises.  Mr. Wenger Morphine equivalent is 105.00 MME.  Last Oral Swab was Performed on 10/01/2019, it was consistent.    Pain Inventory Average Pain 7 Pain Right Now 7 My pain is sharp, stabbing and tingling  In the last 24 hours, has pain interfered with the following? General activity 7 Relation with others 7 Enjoyment of life 7 What TIME of day is your pain at its worst? morning , daytime, evening and night Sleep (in general) Poor  Pain is worse with: walking, sitting and standing Pain improves with: heat/ice, medication and TENS Relief from Meds: 3  Family History  Problem Relation Age of Onset  . Anesthesia problems Neg Hx   . Hypotension Neg Hx   . Malignant hyperthermia Neg Hx   . Pseudochol deficiency Neg Hx    Social History   Socioeconomic History  . Marital status: Single    Spouse name: Not on file  . Number of children: Not on file  . Years of education: Not on file  . Highest education level: Not on file  Occupational History  . Not on file  Tobacco Use  . Smoking status: Current Every Day Smoker    Packs/day: 1.00    Years: 33.00    Pack years: 33.00    Types: Cigarettes  . Smokeless tobacco: Never Used  Vaping Use  . Vaping Use: Never used  Substance and Sexual Activity  . Alcohol use: No  . Drug use: No  . Sexual activity: Never    Comment: back problems  Other Topics Concern  . Not on file  Social History Narrative  . Not on file   Social Determinants of Health   Financial Resource Strain:   . Difficulty of Paying Living  Expenses: Not on file  Food Insecurity:   . Worried About Charity fundraiser in the Last Year: Not on file  . Ran Out of Food in the Last Year: Not on file  Transportation Needs:   . Lack of Transportation (Medical): Not on file  . Lack of Transportation (Non-Medical): Not on file  Physical Activity:   . Days of Exercise per Week: Not on file  . Minutes of Exercise per Session: Not on file  Stress:   . Feeling of Stress : Not on file  Social Connections:   . Frequency of Communication with Friends and Family: Not on file  . Frequency of Social Gatherings with Friends and Family: Not on file  . Attends Religious Services: Not on file  . Active Member of Clubs or Organizations: Not on file  . Attends Archivist Meetings: Not on file  . Marital Status: Not on file   Past Surgical History:  Procedure Laterality Date  . BACK SURGERY  2013   Back surgery  (fusion)09/15/2010 & 2013  . BACK SURGERY  03-2013  . HARDWARE REMOVAL N/A 03/14/2013   Procedure: HARDWARE REMOVAL;  Surgeon: Eustace Moore, MD;  Location: [place] [facility];  Service: Neurosurgery;  Laterality: N/A;   Past Surgical History:  Procedure Laterality Date  . BACK SURGERY  2013   Back surgery  (fusion)09/15/2010 & 2013  . BACK SURGERY  03-2013  . HARDWARE REMOVAL N/A 03/14/2013   Procedure: HARDWARE REMOVAL;  Surgeon: Eustace Moore, MD;  Location: [place] [facility];  Service: Neurosurgery;  Laterality: N/A;   Past Medical History:  Diagnosis Date  . Goals of care, counseling/discussion 04/07/2016  . Iron deficiency anemia due to chronic blood loss 10/21/2016  . 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 No pertinent past medical history   . Occupational injury 2013   resulted in back injury that led to surgery  . Thyroid cancer (HCC)    BP (!) 141/81   Pulse 82   Temp 98.2 F (36.8 C)   Ht 5\' 7"  (1.702 m)   Wt 125 lb (56.7 kg)   SpO2 92%   BMI 19.58 kg/m   Opioid Risk Score:   Fall Risk Score:  `1  Depression screen  PHQ 2/9  Depression screen [facility] 2/9 11/14/2018 02/16/2018 12/22/2017 10/24/2017 06/22/2017 04/20/2017 03/20/2017  Decreased Interest 0 0 0 0 0 0 0  Down, Depressed, Hopeless 0 0 0 0 0 0 0  PHQ - 2 Score 0 0 0 0 0 0 0  Altered sleeping - - - - - - -  Tired, decreased energy - - - - - - -  Change in appetite - - - - - - -  Feeling bad or failure about yourself  - - - - - - -  Trouble concentrating - - - - - - -  Moving slowly or fidgety/restless - - - - - - -  Suicidal thoughts - - - - - - -  PHQ-9 Score - - - - - - -  Some recent data might be hidden    Review of Systems  Constitutional: Negative.   HENT: Negative.   Eyes: Negative.   Respiratory: Negative.   Cardiovascular: Negative.   Gastrointestinal: Negative.   Endocrine: Negative.   Genitourinary: Negative.   Musculoskeletal: Positive for back pain.  Skin: Negative.   Allergic/Immunologic: Negative.   Neurological: Negative.   Hematological: Negative.   Psychiatric/Behavioral: Negative.        Objective:   Physical Exam Vitals and nursing note reviewed.  Constitutional:      Appearance: Normal appearance.  Neck:     Comments: Cervical Paraspinal Tenderness: C-5-C-6 Cardiovascular:     Rate and Rhythm: Normal rate and regular rhythm.     Pulses: Normal pulses.     Heart sounds: Normal heart sounds.  Pulmonary:     Effort: Pulmonary effort is normal.     Breath sounds: Normal breath sounds.  Musculoskeletal:     Cervical back: Normal range of motion and neck supple.     Comments: Normal Muscle Bulk and Muscle Testing Reveals:  Upper Extremities: Full ROM and Muscle Strength 5/5 Right AC Joint Tenderness  Thoracic Paraspinal Tenderness: T-7-T-9 Lumbar Hypersensitivity Lower Extremities: Full ROM and Muscle Strength 5/5 Arises from Table with Ease Narrow Based  Gait   Skin:    General: Skin is warm and dry.  Neurological:     Mental Status: He is alert and oriented to person, place, and time.  Psychiatric:         Mood and Affect: Mood normal.        Behavior: Behavior normal.           Assessment & Plan:  1. Lumbar postlaminectomy syndrome status post lumbar fusion  x3: His most recent surgery was 03/14/2013. He had hardware removal and bone allograft waist and L2-3. Continue to Monitor. Continue current Medication Regimen.11/29/2019 Refilled: oxyCODONE 7.5/325mg  one tablet every 4 hours as needed #100 [id number] mg one tablet every 12 hours #60. We will continue the opioid monitoring program, this consists of regular clinic visits, examinations, urine drug screen, pill counts as well as use of [place] Controlled Substance Reporting system. A 12 month History has been reviewed on the [place] Controlled Substance Reporting Systemon 11/29/2019. 2. Lumbar Radiculopathy: Continuecurrent medication regimen withLyrica.11/29/2019 3. Insomnia: Continuecurrent medication regimen withTrazodone.11/29/2019 4. Nausea: Continue Zofran as needed.Oncology Following.11/29/2019 5. Cervical Spondylosis/ Cervicalgia/ Cervical Radiculitis:Continuecurrent medication regimen withLyrica.11/29/2019 6. Cervical Myofascial Pain Syndrome: Continue current treatment regimen. Continue to monitor.11/29/2019.  20 minutes of face to face patient care time was spent during this visit. All questions were encouraged and answered.  F/U in 1 month

## 2019-12-31 ENCOUNTER — Encounter: Payer: Medicare Other | Attending: Physical Medicine and Rehabilitation | Admitting: Registered Nurse

## 2019-12-31 ENCOUNTER — Other Ambulatory Visit: Payer: Self-pay

## 2019-12-31 ENCOUNTER — Encounter: Payer: Self-pay | Admitting: Registered Nurse

## 2019-12-31 VITALS — BP 141/76 | HR 84 | Temp 97.6°F | Ht 67.0 in | Wt 127.0 lb

## 2019-12-31 DIAGNOSIS — M5412 Radiculopathy, cervical region: Secondary | ICD-10-CM | POA: Insufficient documentation

## 2019-12-31 DIAGNOSIS — G894 Chronic pain syndrome: Secondary | ICD-10-CM | POA: Insufficient documentation

## 2019-12-31 DIAGNOSIS — M546 Pain in thoracic spine: Secondary | ICD-10-CM | POA: Insufficient documentation

## 2019-12-31 DIAGNOSIS — M542 Cervicalgia: Secondary | ICD-10-CM | POA: Insufficient documentation

## 2019-12-31 DIAGNOSIS — M961 Postlaminectomy syndrome, not elsewhere classified: Secondary | ICD-10-CM | POA: Insufficient documentation

## 2019-12-31 DIAGNOSIS — M7918 Myalgia, other site: Secondary | ICD-10-CM | POA: Diagnosis not present

## 2019-12-31 DIAGNOSIS — Z5181 Encounter for therapeutic drug level monitoring: Secondary | ICD-10-CM | POA: Diagnosis not present

## 2019-12-31 DIAGNOSIS — G8929 Other chronic pain: Secondary | ICD-10-CM | POA: Insufficient documentation

## 2019-12-31 DIAGNOSIS — Z9889 Other specified postprocedural states: Secondary | ICD-10-CM | POA: Insufficient documentation

## 2019-12-31 DIAGNOSIS — Z79891 Long term (current) use of opiate analgesic: Secondary | ICD-10-CM | POA: Diagnosis not present

## 2019-12-31 MED ORDER — OXYCODONE HCL ER 20 MG PO T12A: 20.0000 mg | EXTENDED_RELEASE_TABLET | Freq: Two times a day (BID) | ORAL | 0 refills | Status: DC

## 2019-12-31 MED ORDER — OXYCODONE-ACETAMINOPHEN 7.5-325 MG PO TABS: 1.0000 | ORAL_TABLET | ORAL | 0 refills | Status: DC | PRN

## 2019-12-31 MED ORDER — PREGABALIN 100 MG PO CAPS: 100.0000 mg | ORAL_CAPSULE | Freq: Three times a day (TID) | ORAL | 5 refills | Status: DC

## 2019-12-31 NOTE — Progress Notes (Signed)
Subjective:    Patient ID: Bruce Olson, male    DOB: [birth year], 64 y.o.   MRN: [id number]  HPI: Bruce Olson is a 64 y.o. male who returns for follow up appointment for chronic pain and medication refill. He states his pain is located in his neck radiating into his right shoulder and mid- lower back pain. He rates his pain 7. His  current exercise regime is walking and performing stretching exercises.  Mr. Kegley Morphine equivalent is 97.50 MME.    Last Oral Swab was Performed on 10/01/2019, it was consistent.    Pain Inventory Average Pain 7 Pain Right Now 7 My pain is constant, sharp, stabbing and tingling  In the last 24 hours, has pain interfered with the following? General activity 7 Relation with others 7 Enjoyment of life 7 What TIME of day is your pain at its worst? morning , daytime, evening, night and varies Sleep (in general) Poor  Pain is worse with: walking, sitting and standing Pain improves with: heat/ice, medication and TENS Relief from Meds: 3  Family History  Problem Relation Age of Onset  . Anesthesia problems Neg Hx   . Hypotension Neg Hx   . Malignant hyperthermia Neg Hx   . Pseudochol deficiency Neg Hx    Social History   Socioeconomic History  . Marital status: Single    Spouse name: Not on file  . Number of children: Not on file  . Years of education: Not on file  . Highest education level: Not on file  Occupational History  . Not on file  Tobacco Use  . Smoking status: Current Every Day Smoker    Packs/day: 1.00    Years: 33.00    Pack years: 33.00    Types: Cigarettes  . Smokeless tobacco: Never Used  Vaping Use  . Vaping Use: Never used  Substance and Sexual Activity  . Alcohol use: No  . Drug use: No  . Sexual activity: Never    Comment: back problems  Other Topics Concern  . Not on file  Social History Narrative  . Not on file   Social Determinants of Health   Financial Resource Strain:   . Difficulty  of Paying Living Expenses: Not on file  Food Insecurity:   . Worried About Charity fundraiser in the Last Year: Not on file  . Ran Out of Food in the Last Year: Not on file  Transportation Needs:   . Lack of Transportation (Medical): Not on file  . Lack of Transportation (Non-Medical): Not on file  Physical Activity:   . Days of Exercise per Week: Not on file  . Minutes of Exercise per Session: Not on file  Stress:   . Feeling of Stress : Not on file  Social Connections:   . Frequency of Communication with Friends and Family: Not on file  . Frequency of Social Gatherings with Friends and Family: Not on file  . Attends Religious Services: Not on file  . Active Member of Clubs or Organizations: Not on file  . Attends Archivist Meetings: Not on file  . Marital Status: Not on file   Past Surgical History:  Procedure Laterality Date  . BACK SURGERY  2013   Back surgery  (fusion)09/15/2010 & 2013  . BACK SURGERY  03-2013  . HARDWARE REMOVAL N/A 03/14/2013   Procedure: HARDWARE REMOVAL;  Surgeon: Eustace Moore, MD;  Location: [place] [facility];  Service: Neurosurgery;  Laterality: N/A;  Past Surgical History:  Procedure Laterality Date  . BACK SURGERY  2013   Back surgery  (fusion)09/15/2010 & 2013  . BACK SURGERY  03-2013  . HARDWARE REMOVAL N/A 03/14/2013   Procedure: HARDWARE REMOVAL;  Surgeon: Eustace Moore, MD;  Location: [place] [facility]UR[facility];  Service: Neurosurgery;  Laterality: N/A;   Past Medical History:  Diagnosis Date  . Goals of care, counseling/discussion 04/07/2016  . Iron deficiency anemia due to chronic blood loss 10/21/2016  . 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 No pertinent past medical history   . Occupational injury 2013   resulted in back injury that led to surgery  . Thyroid cancer ([place])    There were no vitals taken for this visit.  Opioid Risk Score:   Fall Risk Score:  `1  Depression screen PHQ 2/9  Depression screen [facility] 2/9 11/14/2018 02/16/2018 12/22/2017  10/24/2017 06/22/2017 04/20/2017 03/20/2017  Decreased Interest 0 0 0 0 0 0 0  Down, Depressed, Hopeless 0 0 0 0 0 0 0  PHQ - 2 Score 0 0 0 0 0 0 0  Altered sleeping - - - - - - -  Tired, decreased energy - - - - - - -  Change in appetite - - - - - - -  Feeling bad or failure about yourself  - - - - - - -  Trouble concentrating - - - - - - -  Moving slowly or fidgety/restless - - - - - - -  Suicidal thoughts - - - - - - -  PHQ-9 Score - - - - - - -  Some recent data might be hidden   Review of Systems  Musculoskeletal: Positive for back pain and gait problem.  All other systems reviewed and are negative.      Objective:   Physical Exam Vitals and nursing note reviewed.  Constitutional:      Appearance: Normal appearance.  Cardiovascular:     Rate and Rhythm: Normal rate and regular rhythm.     Pulses: Normal pulses.     Heart sounds: Normal heart sounds.  Pulmonary:     Effort: Pulmonary effort is normal.     Breath sounds: Normal breath sounds.  Musculoskeletal:     Cervical back: Normal range of motion and neck supple.     Comments: Normal Muscle Bulk and Muscle Testing Reveals:  Upper Extremities: Decreased ROM 90 Degrees and Muscle Strength 5/5 Right AC Joint Tenderness  Thoracic and Lumbar Hypersensitivity Lower Extremities: Full ROM and Muscle Strength 5/5  Arises from Table slowly Narrow Based     Skin:    General: Skin is warm.  Neurological:     Mental Status: He is alert and oriented to person, place, and time.  Psychiatric:        Mood and Affect: Mood normal.        Behavior: Behavior normal.           Assessment & Plan:  1. Lumbar postlaminectomy syndrome status post lumbar fusion x3: His most recent surgery was 03/14/2013. He had hardware removal and bone allograft waist and L2-3. Continue to Monitor. Continue current Medication Regimen.12/31/2019 Refilled: oxyCODONE 7.5/325mg  one tablet every 4 hours as needed #100 [id number] mg one tablet  every 12 hours #60. We will continue the opioid monitoring program, this consists of regular clinic visits, examinations, urine drug screen, pill counts as well as use of [place] Controlled Substance Reporting system. A 12 month History has been reviewed on  the [place] Controlled Substance Reporting Systemon11/30/2021. 2. Lumbar Radiculopathy: Continuecurrent medication regimen withLyrica.12/31/2019 3. Insomnia: Continuecurrent medication regimen withTrazodone.12/31/2019 4. Nausea: Continue Zofran as needed.Oncology Following.12/31/2019 5. Cervical Spondylosis/ Cervicalgia/ Cervical Radiculitis:Continuecurrent medication regimen withLyrica.12/31/2019 6. Cervical Myofascial Pain Syndrome: Continue current treatment regimen. Continue to monitor.12/31/2019.   F/U in 1 month

## 2020-01-21 ENCOUNTER — Encounter: Payer: Self-pay | Admitting: Registered Nurse

## 2020-01-21 ENCOUNTER — Other Ambulatory Visit: Payer: Self-pay

## 2020-01-21 ENCOUNTER — Encounter: Payer: Medicare Other | Attending: Physical Medicine and Rehabilitation | Admitting: Registered Nurse

## 2020-01-21 VITALS — BP 157/77 | HR 81 | Ht 67.0 in | Wt 128.2 lb

## 2020-01-21 DIAGNOSIS — G8929 Other chronic pain: Secondary | ICD-10-CM

## 2020-01-21 DIAGNOSIS — M5412 Radiculopathy, cervical region: Secondary | ICD-10-CM

## 2020-01-21 DIAGNOSIS — Z5181 Encounter for therapeutic drug level monitoring: Secondary | ICD-10-CM

## 2020-01-21 DIAGNOSIS — M542 Cervicalgia: Secondary | ICD-10-CM | POA: Diagnosis not present

## 2020-01-21 DIAGNOSIS — Z79891 Long term (current) use of opiate analgesic: Secondary | ICD-10-CM

## 2020-01-21 DIAGNOSIS — M961 Postlaminectomy syndrome, not elsewhere classified: Secondary | ICD-10-CM

## 2020-01-21 DIAGNOSIS — Z9889 Other specified postprocedural states: Secondary | ICD-10-CM | POA: Diagnosis not present

## 2020-01-21 DIAGNOSIS — M7918 Myalgia, other site: Secondary | ICD-10-CM

## 2020-01-21 DIAGNOSIS — M546 Pain in thoracic spine: Secondary | ICD-10-CM | POA: Insufficient documentation

## 2020-01-21 DIAGNOSIS — G894 Chronic pain syndrome: Secondary | ICD-10-CM | POA: Diagnosis not present

## 2020-01-21 MED ORDER — OXYCODONE HCL ER 20 MG PO T12A: 20.0000 mg | EXTENDED_RELEASE_TABLET | Freq: Two times a day (BID) | ORAL | 0 refills | Status: DC

## 2020-01-21 MED ORDER — OXYCODONE-ACETAMINOPHEN 7.5-325 MG PO TABS: 1.0000 | ORAL_TABLET | ORAL | 0 refills | Status: DC | PRN

## 2020-01-21 NOTE — Progress Notes (Signed)
Subjective:    Patient ID: Bruce Olson, male    DOB: 1955/11/08, 64 y.o.   MRN: [id number]  HPI: Bruce Olson is a 64 y.o. male who returns for follow up appointment for chronic pain and medication refill. He states his pain is located in his neck radiating into his right shoulder and mid- lower back pain. He rates his pain 7. His  current exercise regime is walking and performing stretching exercises.  Bruce Olson Morphine equivalent is 105.00 MME.    Last Oral Swab was Performed on 10/01/2019, it was consistent.    Pain Inventory Average Pain 7 Pain Right Now 7 My pain is constant, sharp, stabbing and tingling  In the last 24 hours, has pain interfered with the following? General activity 7 Relation with others 7 Enjoyment of life 7 What TIME of day is your pain at its worst? morning , daytime, evening and night Sleep (in general) Poor  Pain is worse with: walking, sitting and standing Pain improves with: heat/ice, medication and TENS Relief from Meds: 3  Family History  Problem Relation Age of Onset  . Anesthesia problems Neg Hx   . Hypotension Neg Hx   . Malignant hyperthermia Neg Hx   . Pseudochol deficiency Neg Hx    Social History   Socioeconomic History  . Marital status: Single    Spouse name: Not on file  . Number of children: Not on file  . Years of education: Not on file  . Highest education level: Not on file  Occupational History  . Not on file  Tobacco Use  . Smoking status: Current Every Day Smoker    Packs/day: 1.00    Years: 33.00    Pack years: 33.00    Types: Cigarettes  . Smokeless tobacco: Never Used  Vaping Use  . Vaping Use: Never used  Substance and Sexual Activity  . Alcohol use: No  . Drug use: No  . Sexual activity: Never    Comment: back problems  Other Topics Concern  . Not on file  Social History Narrative  . Not on file   Social Determinants of Health   Financial Resource Strain: Not on file  Food  Insecurity: Not on file  Transportation Needs: Not on file  Physical Activity: Not on file  Stress: Not on file  Social Connections: Not on file   Past Surgical History:  Procedure Laterality Date  . BACK SURGERY  2013   Back surgery  (fusion)09/15/2010 & 2013  . BACK SURGERY  03-2013  . HARDWARE REMOVAL N/A 03/14/2013   Procedure: HARDWARE REMOVAL;  Surgeon: Tia Alert, MD;  Location: [facility];  Service: Neurosurgery;  Laterality: N/A;   Past Surgical History:  Procedure Laterality Date  . BACK SURGERY  2013   Back surgery  (fusion)09/15/2010 & 2013  . BACK SURGERY  03-2013  . HARDWARE REMOVAL N/A 03/14/2013   Procedure: HARDWARE REMOVAL;  Surgeon: Tia Alert, MD;  Location: [facility];  Service: Neurosurgery;  Laterality: N/A;   Past Medical History:  Diagnosis Date  . Goals of care, counseling/discussion 04/07/2016  . Iron deficiency anemia due to chronic blood loss 10/21/2016  . 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 No pertinent past medical history   . Occupational injury 2013   resulted in back injury that led to surgery  . Thyroid cancer (HCC)    There were no vitals taken for this visit.  Opioid Risk Score:   Fall Risk Score:  `  1  Depression screen PHQ 2/9  Depression screen [facility] 2/9 12/31/2019 11/14/2018 02/16/2018 12/22/2017 10/24/2017 06/22/2017 04/20/2017  Decreased Interest 0 0 0 0 0 0 0  Down, Depressed, Hopeless 0 0 0 0 0 0 0  PHQ - 2 Score 0 0 0 0 0 0 0  Altered sleeping - - - - - - -  Tired, decreased energy - - - - - - -  Change in appetite - - - - - - -  Feeling bad or failure about yourself  - - - - - - -  Trouble concentrating - - - - - - -  Moving slowly or fidgety/restless - - - - - - -  Suicidal thoughts - - - - - - -  PHQ-9 Score - - - - - - -  Some recent data might be hidden   Review of Systems  Musculoskeletal: Positive for back pain.       Right upper leg numbness  All other systems reviewed and are negative.      Objective:   Physical  Exam Vitals and nursing note reviewed.  Constitutional:      Appearance: Normal appearance.  Cardiovascular:     Rate and Rhythm: Normal rate and regular rhythm.     Pulses: Normal pulses.     Heart sounds: Normal heart sounds.  Pulmonary:     Effort: Pulmonary effort is normal.     Breath sounds: Normal breath sounds.  Musculoskeletal:     Cervical back: Normal range of motion and neck supple.     Comments: Normal Muscle Bulk and Muscle Testing Reveals:  Upper Extremities: Decreased ROM 90 Degrees  and Muscle Strength 5/5 Right AC Joint Tenderness  Thoracic and Lumbar Hypersensitivity Lower Extremities: Full ROM and Muscle Strength 5/5 Arises from Table Slowly Narrow Based  Gait   Skin:    General: Skin is warm and dry.  Neurological:     Mental Status: He is alert and oriented to person, place, and time.  Psychiatric:        Mood and Affect: Mood normal.        Behavior: Behavior normal.           Assessment & Plan:  1. Lumbar postlaminectomy syndrome status post lumbar fusion x3: His most recent surgery was 03/14/2013. He had hardware removal and bone allograft waist and L2-3. Continue to Monitor. Continue current Medication Regimen.01/21/2020 Refilled: oxyCODONE 7.5/325mg  one tablet every 4 hours as needed #100 [id number] mg one tablet every 12 hours #60. We will continue the opioid monitoring program, this consists of regular clinic visits, examinations, urine drug screen, pill counts as well as use of [place] Controlled Substance Reporting system. A 12 month History has been reviewed on the [place] Controlled Substance Reporting Systemon12/21/2021. 2. Lumbar Radiculopathy: Continuecurrent medication regimen withLyrica.01/21/2020 3. Insomnia: Continuecurrent medication regimen withTrazodone.01/21/2020 4. Nausea: Continue Zofran as needed.Oncology Following.01/21/2020 5. Cervical Spondylosis/ Cervicalgia/ Cervical  Radiculitis:Continuecurrent medication regimen withLyrica.01/21/2020 6. Cervical Myofascial Pain Syndrome: Continue current treatment regimen. Continue to monitor.01/21/2020.   F/U in 1 month

## 2020-02-10 ENCOUNTER — Other Ambulatory Visit: Payer: Self-pay | Admitting: Hematology & Oncology

## 2020-02-10 DIAGNOSIS — C090 Malignant neoplasm of tonsillar fossa: Secondary | ICD-10-CM

## 2020-02-10 DIAGNOSIS — Y842 Radiological procedure and radiotherapy as the cause of abnormal reaction of the patient, or of later complication, without mention of misadventure at the time of the procedure: Secondary | ICD-10-CM

## 2020-02-10 DIAGNOSIS — E034 Atrophy of thyroid (acquired): Secondary | ICD-10-CM

## 2020-02-25 ENCOUNTER — Encounter: Payer: Medicare Other | Attending: Physical Medicine and Rehabilitation | Admitting: Registered Nurse

## 2020-02-25 ENCOUNTER — Other Ambulatory Visit: Payer: Self-pay

## 2020-02-25 ENCOUNTER — Encounter: Payer: Self-pay | Admitting: Registered Nurse

## 2020-02-25 VITALS — BP 144/79 | HR 86 | Temp 98.2°F | Ht 67.0 in | Wt 128.0 lb

## 2020-02-25 DIAGNOSIS — Z5181 Encounter for therapeutic drug level monitoring: Secondary | ICD-10-CM | POA: Insufficient documentation

## 2020-02-25 DIAGNOSIS — M961 Postlaminectomy syndrome, not elsewhere classified: Secondary | ICD-10-CM | POA: Diagnosis not present

## 2020-02-25 DIAGNOSIS — M542 Cervicalgia: Secondary | ICD-10-CM | POA: Diagnosis not present

## 2020-02-25 DIAGNOSIS — R202 Paresthesia of skin: Secondary | ICD-10-CM | POA: Diagnosis not present

## 2020-02-25 DIAGNOSIS — G894 Chronic pain syndrome: Secondary | ICD-10-CM | POA: Insufficient documentation

## 2020-02-25 DIAGNOSIS — Z9889 Other specified postprocedural states: Secondary | ICD-10-CM | POA: Diagnosis not present

## 2020-02-25 DIAGNOSIS — M5412 Radiculopathy, cervical region: Secondary | ICD-10-CM | POA: Diagnosis not present

## 2020-02-25 DIAGNOSIS — M7918 Myalgia, other site: Secondary | ICD-10-CM | POA: Diagnosis not present

## 2020-02-25 DIAGNOSIS — Z79891 Long term (current) use of opiate analgesic: Secondary | ICD-10-CM | POA: Diagnosis not present

## 2020-02-25 DIAGNOSIS — M546 Pain in thoracic spine: Secondary | ICD-10-CM | POA: Diagnosis not present

## 2020-02-25 DIAGNOSIS — G8929 Other chronic pain: Secondary | ICD-10-CM | POA: Insufficient documentation

## 2020-02-25 MED ORDER — OXYCODONE-ACETAMINOPHEN 7.5-325 MG PO TABS: 1.0000 | ORAL_TABLET | ORAL | 0 refills | Status: DC | PRN

## 2020-02-25 MED ORDER — OXYCODONE HCL ER 20 MG PO T12A: 20.0000 mg | EXTENDED_RELEASE_TABLET | Freq: Two times a day (BID) | ORAL | 0 refills | Status: DC

## 2020-02-25 NOTE — Progress Notes (Signed)
Subjective:    Patient ID: Bruce Olson, male    DOB: [birth year], 65 y.o.   MRN: [id number]  HPI: Bruce Olson is a 65 y.o. male who returns for follow up appointment for chronic pain and medication refill. He states his pain is located in his neck radiating into his right shoulder and mid- lower back pain. Also reports right lower extremity with numbness. He rates his pain 7. His  current exercise regime is walking and performing stretching exercises.  [name] equivalent is 105.00  MME.    Oral Swab was Performed today.    Pain Inventory Average Pain 7 Pain Right Now 7 My pain is sharp, stabbing and tingling  In the last 24 hours, has pain interfered with the following? General activity 7 Relation with others 7 Enjoyment of life 7 What TIME of day is your pain at its worst? morning , daytime, evening and night Sleep (in general) Poor  Pain is worse with: walking, sitting and standing Pain improves with: heat/ice, medication and TENS Relief from Meds: 3  Family History  Problem Relation Age of Onset  . Anesthesia problems Neg Hx   . Hypotension Neg Hx   . Malignant hyperthermia Neg Hx   . Pseudochol deficiency Neg Hx    Social History   Socioeconomic History  . Marital status: Single    Spouse name: Not on file  . Number of children: Not on file  . Years of education: Not on file  . Highest education level: Not on file  Occupational History  . Not on file  Tobacco Use  . Smoking status: Current Every Day Smoker    Packs/day: 1.00    Years: 33.00    Pack years: 33.00    Types: Cigarettes  . Smokeless tobacco: Never Used  Vaping Use  . Vaping Use: Never used  Substance and Sexual Activity  . Alcohol use: No  . Drug use: No  . Sexual activity: Never    Comment: back problems  Other Topics Concern  . Not on file  Social History Narrative  . Not on file   Social Determinants of Health   Financial Resource Strain: Not on file   Food Insecurity: Not on file  Transportation Needs: Not on file  Physical Activity: Not on file  Stress: Not on file  Social Connections: Not on file   Past Surgical History:  Procedure Laterality Date  . BACK SURGERY  2013   Back surgery  (fusion)09/15/2010 & 2013  . BACK SURGERY  03-2013  . HARDWARE REMOVAL N/A 03/14/2013   Procedure: HARDWARE REMOVAL;  Surgeon: Eustace Moore, MD;  Location: [place] [facility];  Service: Neurosurgery;  Laterality: N/A;   Past Surgical History:  Procedure Laterality Date  . BACK SURGERY  2013   Back surgery  (fusion)09/15/2010 & 2013  . BACK SURGERY  03-2013  . HARDWARE REMOVAL N/A 03/14/2013   Procedure: HARDWARE REMOVAL;  Surgeon: Eustace Moore, MD;  Location: [place] [facility];  Service: Neurosurgery;  Laterality: N/A;   Past Medical History:  Diagnosis Date  . Goals of care, counseling/discussion 04/07/2016  . Iron deficiency anemia due to chronic blood loss 10/21/2016  . 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 No pertinent past medical history   . Occupational injury 2013   resulted in back injury that led to surgery  . Thyroid cancer (HCC)    BP (!) 144/79   Pulse 86   Temp 98.2 F (36.8 C)  Ht 5\' 7"  (1.702 m)   Wt 128 lb (58.1 kg)   SpO2 93%   BMI 20.05 kg/m   Opioid Risk Score:   Fall Risk Score:  `1  Depression screen PHQ 2/9  Depression screen [facility] 2/9 01/21/2020 12/31/2019 11/14/2018 02/16/2018 12/22/2017 10/24/2017 06/22/2017  Decreased Interest 0 0 0 0 0 0 0  Down, Depressed, Hopeless 0 0 0 0 0 0 0  PHQ - 2 Score 0 0 0 0 0 0 0  Altered sleeping - - - - - - -  Tired, decreased energy - - - - - - -  Change in appetite - - - - - - -  Feeling bad or failure about yourself  - - - - - - -  Trouble concentrating - - - - - - -  Moving slowly or fidgety/restless - - - - - - -  Suicidal thoughts - - - - - - -  PHQ-9 Score - - - - - - -  Some recent data might be hidden    Review of Systems  Constitutional: Negative.   HENT: Negative.   Eyes:  Negative.   Respiratory: Negative.   Cardiovascular: Negative.   Gastrointestinal: Negative.   Endocrine: Negative.   Genitourinary: Negative.   Musculoskeletal: Positive for back pain.  Skin: Negative.   Allergic/Immunologic: Negative.   Neurological: Negative.   Hematological: Negative.   Psychiatric/Behavioral: Negative.   All other systems reviewed and are negative.      Objective:   Physical Exam Vitals and nursing note reviewed.  Constitutional:      Appearance: Normal appearance.  Cardiovascular:     Rate and Rhythm: Normal rate and regular rhythm.     Pulses: Normal pulses.     Heart sounds: Normal heart sounds.  Pulmonary:     Effort: Pulmonary effort is normal.     Breath sounds: Normal breath sounds.  Musculoskeletal:     Cervical back: Normal range of motion and neck supple.     Comments: Normal Muscle Bulk and Muscle Testing Reveals:  Upper Extremities: Full ROM and Muscle Strength 5/5 Thoracic Paraspinal Tenderness: T-1-T-3 Mainly Right Side Thoracic Hypersensitivity: T-10-T-12  Lumbar Hypersensitivity Lower Extremities: Full ROM and Muscle Strength 5/5 Arises from Table Slowly Narrow Based  Gait   Skin:    General: Skin is warm and dry.  Neurological:     Mental Status: He is alert and oriented to person, place, and time.  Psychiatric:        Mood and Affect: Mood normal.        Behavior: Behavior normal.           Assessment & Plan:  1. Lumbar postlaminectomy syndrome status post lumbar fusion x3: His most recent surgery was 03/14/2013. He had hardware removal and bone allograft waist and L2-3. Continue to Monitor. Continue current Medication Regimen.02/25/2020 Refilled: oxyCODONE 7.5/325mg  one tablet every 4 hours as needed #100 [id number] mg one tablet every 12 hours #60. We will continue the opioid monitoring program, this consists of regular clinic visits, examinations, urine drug screen, pill counts as well as use of [place]  Controlled Substance Reporting system. A 12 month History has been reviewed on the [place] Controlled Substance Reporting Systemon01/25/2022. 2. Lumbar Radiculopathy: Continuecurrent medication regimen withLyrica.02/25/2020 3. Insomnia: Continuecurrent medication regimen withTrazodone.02/25/2020 4. Nausea: Continue Zofran as needed.Oncology Following.02/25/2020 5. Cervical Spondylosis/ Cervicalgia/ Cervical Radiculitis:Continuecurrent medication regimen withLyrica.02/25/2020 6. Cervical Myofascial Pain Syndrome: Continue current treatment regimen. Continue to monitor.02/25/2020. 7. Paresthesia:  Continue Lyrica: Continue to Monitor.   F/U in 1 month

## 2020-03-01 LAB — DRUG TOX MONITOR 1 W/CONF, ORAL FLD
Amphetamines: NEGATIVE ng/mL (ref <10)
Barbiturates: NEGATIVE ng/mL (ref <10)
Benzodiazepines: NEGATIVE ng/mL (ref <0.50)
Buprenorphine: NEGATIVE ng/mL (ref <0.10)
Cocaine: NEGATIVE ng/mL (ref <5.0)
Codeine: NEGATIVE ng/mL (ref <2.5)
Cotinine: 167.1 ng/mL — ABNORMAL HIGH (ref <5.0)
Dihydrocodeine: NEGATIVE ng/mL (ref <2.5)
Fentanyl: NEGATIVE ng/mL (ref <0.10)
Heroin Metabolite: NEGATIVE ng/mL (ref <1.0)
Hydrocodone: NEGATIVE ng/mL (ref <2.5)
Hydromorphone: NEGATIVE ng/mL (ref <2.5)
MARIJUANA: NEGATIVE ng/mL (ref <2.5)
MDMA: NEGATIVE ng/mL (ref <10)
Meprobamate: NEGATIVE ng/mL (ref <2.5)
Methadone: NEGATIVE ng/mL (ref <5.0)
Morphine: NEGATIVE ng/mL (ref <2.5)
Nicotine Metabolite: POSITIVE ng/mL — AB (ref <5.0)
Norhydrocodone: NEGATIVE ng/mL (ref <2.5)
Noroxycodone: 96.5 ng/mL — ABNORMAL HIGH (ref <2.5)
Opiates: POSITIVE ng/mL — AB (ref <2.5)
Oxycodone: >250 ng/mL — ABNORMAL HIGH (ref <2.5)
Oxymorphone: 3.6 ng/mL — ABNORMAL HIGH (ref <2.5)
Phencyclidine: NEGATIVE ng/mL (ref <10)
Tapentadol: NEGATIVE ng/mL (ref <5.0)
Tramadol: NEGATIVE ng/mL (ref <5.0)
Zolpidem: NEGATIVE ng/mL (ref <5.0)

## 2020-03-01 LAB — DRUG TOX ALC METAB W/CON, ORAL FLD: Alcohol Metabolite: NEGATIVE ng/mL (ref <25)

## 2020-03-09 ENCOUNTER — Telehealth: Payer: Self-pay | Admitting: *Deleted

## 2020-03-09 NOTE — Telephone Encounter (Signed)
Oral swab drug screen was consistent for prescribed medications.  ?

## 2020-03-24 ENCOUNTER — Encounter: Payer: Self-pay | Admitting: Registered Nurse

## 2020-03-24 ENCOUNTER — Other Ambulatory Visit: Payer: Self-pay

## 2020-03-24 ENCOUNTER — Encounter: Payer: Medicare Other | Attending: Physical Medicine and Rehabilitation | Admitting: Registered Nurse

## 2020-03-24 VITALS — BP 129/80 | HR 78 | Temp 97.9°F | Ht 67.0 in | Wt 128.6 lb

## 2020-03-24 DIAGNOSIS — G8929 Other chronic pain: Secondary | ICD-10-CM

## 2020-03-24 DIAGNOSIS — Z5181 Encounter for therapeutic drug level monitoring: Secondary | ICD-10-CM

## 2020-03-24 DIAGNOSIS — G894 Chronic pain syndrome: Secondary | ICD-10-CM | POA: Diagnosis not present

## 2020-03-24 DIAGNOSIS — Z79891 Long term (current) use of opiate analgesic: Secondary | ICD-10-CM | POA: Diagnosis not present

## 2020-03-24 DIAGNOSIS — M7918 Myalgia, other site: Secondary | ICD-10-CM | POA: Diagnosis not present

## 2020-03-24 DIAGNOSIS — M546 Pain in thoracic spine: Secondary | ICD-10-CM | POA: Diagnosis not present

## 2020-03-24 DIAGNOSIS — Z9889 Other specified postprocedural states: Secondary | ICD-10-CM | POA: Diagnosis not present

## 2020-03-24 DIAGNOSIS — M542 Cervicalgia: Secondary | ICD-10-CM | POA: Diagnosis not present

## 2020-03-24 DIAGNOSIS — M961 Postlaminectomy syndrome, not elsewhere classified: Secondary | ICD-10-CM | POA: Diagnosis not present

## 2020-03-24 MED ORDER — OXYCODONE-ACETAMINOPHEN 7.5-325 MG PO TABS: 1.0000 | ORAL_TABLET | ORAL | 0 refills | Status: DC | PRN

## 2020-03-24 MED ORDER — OXYCODONE HCL ER 20 MG PO T12A: 20.0000 mg | EXTENDED_RELEASE_TABLET | Freq: Two times a day (BID) | ORAL | 0 refills | Status: DC

## 2020-03-24 NOTE — Progress Notes (Signed)
Subjective:    Patient ID: Bruce Olson, male    DOB: 09/29/1955, 65 y.o.   MRN: [id number]  HPI: Bruce Olson is a 65 y.o. male who returns for follow up appointment for chronic pain and medication refill. She states her pain is located in her neck and mid- lower back pain radiating into her right lower extremity with numbness. She rates her pain 7. Her current exercise regime is walking and performing stretching exercises.  Bruce Olson Morphine equivalent is 99.50 MME.    Last Oral Swab was Performed on 02/25/2020, it was consistent.    Pain Inventory Average Pain 7 Pain Right Now 7 My pain is constant, sharp, stabbing and tingling  In the last 24 hours, has pain interfered with the following? General activity 7 Relation with others 7 Enjoyment of life 7 What TIME of day is your pain at its worst? morning , daytime, evening and night Sleep (in general) Poor  Pain is worse with: walking, sitting and standing Pain improves with: heat/ice, medication and TENS Relief from Meds: 3  Family History  Problem Relation Age of Onset  . Anesthesia problems Neg Hx   . Hypotension Neg Hx   . Malignant hyperthermia Neg Hx   . Pseudochol deficiency Neg Hx    Social History   Socioeconomic History  . Marital status: Single    Spouse name: Not on file  . Number of children: Not on file  . Years of education: Not on file  . Highest education level: Not on file  Occupational History  . Not on file  Tobacco Use  . Smoking status: Current Every Day Smoker    Packs/day: 1.00    Years: 33.00    Pack years: 33.00    Types: Cigarettes  . Smokeless tobacco: Never Used  Vaping Use  . Vaping Use: Never used  Substance and Sexual Activity  . Alcohol use: No  . Drug use: No  . Sexual activity: Never    Comment: back problems  Other Topics Concern  . Not on file  Social History Narrative  . Not on file   Social Determinants of Health   Financial Resource Strain:  Not on file  Food Insecurity: Not on file  Transportation Needs: Not on file  Physical Activity: Not on file  Stress: Not on file  Social Connections: Not on file   Past Surgical History:  Procedure Laterality Date  . BACK SURGERY  2013   Back surgery  (fusion)09/15/2010 & 2013  . BACK SURGERY  03-2013  . HARDWARE REMOVAL N/A 03/14/2013   Procedure: HARDWARE REMOVAL;  Surgeon: Eustace Moore, MD;  Location: [place] [facility];  Service: Neurosurgery;  Laterality: N/A;   Past Surgical History:  Procedure Laterality Date  . BACK SURGERY  2013   Back surgery  (fusion)09/15/2010 & 2013  . BACK SURGERY  03-2013  . HARDWARE REMOVAL N/A 03/14/2013   Procedure: HARDWARE REMOVAL;  Surgeon: Eustace Moore, MD;  Location: [place] [facility];  Service: Neurosurgery;  Laterality: N/A;   Past Medical History:  Diagnosis Date  . Goals of care, counseling/discussion 04/07/2016  . Iron deficiency anemia due to chronic blood loss 10/21/2016  . 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 No pertinent past medical history   . Occupational injury 2013   resulted in back injury that led to surgery  . Thyroid cancer ([place])    There were no vitals taken for this visit.  Opioid Risk Score:   Fall  Risk Score:  `1  Depression screen PHQ 2/9  Depression screen [facility] 2/9 01/21/2020 12/31/2019 11/14/2018 02/16/2018 12/22/2017 10/24/2017 06/22/2017  Decreased Interest 0 0 0 0 0 0 0  Down, Depressed, Hopeless 0 0 0 0 0 0 0  PHQ - 2 Score 0 0 0 0 0 0 0  Altered sleeping - - - - - - -  Tired, decreased energy - - - - - - -  Change in appetite - - - - - - -  Feeling bad or failure about yourself  - - - - - - -  Trouble concentrating - - - - - - -  Moving slowly or fidgety/restless - - - - - - -  Suicidal thoughts - - - - - - -  PHQ-9 Score - - - - - - -  Some recent data might be hidden   Review of Systems  Musculoskeletal: Positive for back pain and gait problem.  All other systems reviewed and are negative.      Objective:    Physical Exam Vitals and nursing note reviewed.  Constitutional:      Appearance: Normal appearance.  Neck:     Comments: Cervical Paraspinal Tenderness: C-5-C-6  Cardiovascular:     Rate and Rhythm: Normal rate and regular rhythm.     Pulses: Normal pulses.     Heart sounds: Normal heart sounds.  Pulmonary:     Effort: Pulmonary effort is normal.     Breath sounds: Normal breath sounds.  Musculoskeletal:     Cervical back: Normal range of motion and neck supple.     Comments: Normal Muscle Bulk and Muscle Testing Reveals:  Upper Extremities: Full ROM and Muscle Strength 5/5 Thoracic and Lumbar Hypersensitivity Lower Extremities: Full ROM and Muscle Strength 5/5 Arises from Table slowly Narrow Based Gait   Skin:    General: Skin is warm and dry.  Neurological:     Mental Status: He is alert and oriented to person, place, and time.  Psychiatric:        Mood and Affect: Mood normal.        Behavior: Behavior normal.           Assessment & Plan:  1. Lumbar postlaminectomy syndrome status post lumbar fusion x3: His most recent surgery was 03/14/2013. He had hardware removal and bone allograft waist and L2-3. Continue to Monitor. Continue current Medication Regimen.03/24/2020 Refilled: oxyCODONE 7.5/325mg  one tablet every 4 hours as needed #100 [id number] mg one tablet every 12 hours #60. We will continue the opioid monitoring program, this consists of regular clinic visits, examinations, urine drug screen, pill counts as well as use of [place] Controlled Substance Reporting system. A 12 month History has been reviewed on the [place] Controlled Substance Reporting Systemon02/22/2022. 2. Lumbar Radiculopathy: Continuecurrent medication regimen withLyrica.03/24/2020 3. Insomnia: Continuecurrent medication regimen withTrazodone.03/24/2020 4. Nausea: Continue Zofran as needed.Oncology Following.03/24/2020 5. Cervical Spondylosis/ Cervicalgia/ Cervical  Radiculitis:Continuecurrent medication regimen withLyrica.03/24/2020 6. Cervical Myofascial Pain Syndrome: Continue current treatment regimen. Continue to monitor.03/24/2020. 7. Paresthesia: Continue Lyrica: Continue to Monitor. 03/24/2020.  F/U in 1 month

## 2020-03-27 ENCOUNTER — Other Ambulatory Visit: Payer: Self-pay | Admitting: Hematology & Oncology

## 2020-04-21 ENCOUNTER — Encounter: Payer: Self-pay | Admitting: Registered Nurse

## 2020-04-21 ENCOUNTER — Other Ambulatory Visit: Payer: Self-pay

## 2020-04-21 ENCOUNTER — Encounter: Payer: Medicare Other | Attending: Physical Medicine and Rehabilitation | Admitting: Registered Nurse

## 2020-04-21 VITALS — BP 132/77 | HR 78 | Temp 97.8°F | Ht 67.0 in | Wt 129.0 lb

## 2020-04-21 DIAGNOSIS — M7918 Myalgia, other site: Secondary | ICD-10-CM | POA: Diagnosis not present

## 2020-04-21 DIAGNOSIS — M546 Pain in thoracic spine: Secondary | ICD-10-CM | POA: Diagnosis not present

## 2020-04-21 DIAGNOSIS — M542 Cervicalgia: Secondary | ICD-10-CM | POA: Diagnosis not present

## 2020-04-21 DIAGNOSIS — Z9889 Other specified postprocedural states: Secondary | ICD-10-CM | POA: Insufficient documentation

## 2020-04-21 DIAGNOSIS — G8929 Other chronic pain: Secondary | ICD-10-CM | POA: Insufficient documentation

## 2020-04-21 DIAGNOSIS — M961 Postlaminectomy syndrome, not elsewhere classified: Secondary | ICD-10-CM | POA: Diagnosis not present

## 2020-04-21 DIAGNOSIS — Z79891 Long term (current) use of opiate analgesic: Secondary | ICD-10-CM | POA: Diagnosis not present

## 2020-04-21 DIAGNOSIS — Z5181 Encounter for therapeutic drug level monitoring: Secondary | ICD-10-CM | POA: Diagnosis not present

## 2020-04-21 DIAGNOSIS — G894 Chronic pain syndrome: Secondary | ICD-10-CM | POA: Insufficient documentation

## 2020-04-21 MED ORDER — OXYCODONE HCL ER 20 MG PO T12A: 20.0000 mg | EXTENDED_RELEASE_TABLET | Freq: Two times a day (BID) | ORAL | 0 refills | Status: DC

## 2020-04-21 MED ORDER — OXYCODONE-ACETAMINOPHEN 7.5-325 MG PO TABS: 1.0000 | ORAL_TABLET | ORAL | 0 refills | Status: DC | PRN

## 2020-04-21 NOTE — Progress Notes (Signed)
Subjective:    Patient ID: Bruce Olson, male    DOB: 05-Dec-1955, 65 y.o.   MRN: [id number]  HPI: Bruce Olson is a 65 y.o. male who returns for follow up appointment for chronic pain and medication refill. He states his pain is located in his neck, mid- lower back radiating into his right lower extremity. He rates his pain 7. His  current exercise regime is walking and performing stretching exercises.  Bruce Olson Morphine equivalent is 101.50 MME.    Last Oral Swab was Performed on 02/25/2020, it was consistent.   Pain Inventory Average Pain 7 Pain Right Now 7 My pain is sharp, stabbing and tingling  In the last 24 hours, has pain interfered with the following? General activity 7 Relation with others 7 Enjoyment of life 7 What TIME of day is your pain at its worst? morning , daytime, evening and night Sleep (in general) Poor  Pain is worse with: walking, sitting and standing Pain improves with: heat/ice, medication and TENS Relief from Meds: 3  Family History  Problem Relation Age of Onset  . Anesthesia problems Neg Hx   . Hypotension Neg Hx   . Malignant hyperthermia Neg Hx   . Pseudochol deficiency Neg Hx    Social History   Socioeconomic History  . Marital status: Single    Spouse name: Not on file  . Number of children: Not on file  . Years of education: Not on file  . Highest education level: Not on file  Occupational History  . Not on file  Tobacco Use  . Smoking status: Current Every Day Smoker    Packs/day: 1.00    Years: 33.00    Pack years: 33.00    Types: Cigarettes  . Smokeless tobacco: Never Used  Vaping Use  . Vaping Use: Never used  Substance and Sexual Activity  . Alcohol use: No  . Drug use: No  . Sexual activity: Never    Comment: back problems  Other Topics Concern  . Not on file  Social History Narrative  . Not on file   Social Determinants of Health   Financial Resource Strain: Not on file  Food Insecurity: Not  on file  Transportation Needs: Not on file  Physical Activity: Not on file  Stress: Not on file  Social Connections: Not on file   Past Surgical History:  Procedure Laterality Date  . BACK SURGERY  2013   Back surgery  (fusion)09/15/2010 & 2013  . BACK SURGERY  03-2013  . HARDWARE REMOVAL N/A 03/14/2013   Procedure: HARDWARE REMOVAL;  Surgeon: Eustace Moore, MD;  Location: [place] [facility];  Service: Neurosurgery;  Laterality: N/A;   Past Surgical History:  Procedure Laterality Date  . BACK SURGERY  2013   Back surgery  (fusion)09/15/2010 & 2013  . BACK SURGERY  03-2013  . HARDWARE REMOVAL N/A 03/14/2013   Procedure: HARDWARE REMOVAL;  Surgeon: Eustace Moore, MD;  Location: [place] [facility];  Service: Neurosurgery;  Laterality: N/A;   Past Medical History:  Diagnosis Date  . Goals of care, counseling/discussion 04/07/2016  . Iron deficiency anemia due to chronic blood loss 10/21/2016  . 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 No pertinent past medical history   . Occupational injury 2013   resulted in back injury that led to surgery  . Thyroid cancer (HCC)    BP 132/77   Pulse 78   Temp 97.8 F (36.6 C)   Ht 5\' 7"  (1.702 m)  Wt 129 lb (58.5 kg)   SpO2 90%   BMI 20.20 kg/m   Opioid Risk Score:   Fall Risk Score:  `1  Depression screen PHQ 2/9  Depression screen [facility] 2/9 03/24/2020 01/21/2020 12/31/2019 11/14/2018 02/16/2018 12/22/2017 10/24/2017  Decreased Interest 0 0 0 0 0 0 0  Down, Depressed, Hopeless 0 0 0 0 0 0 0  PHQ - 2 Score 0 0 0 0 0 0 0  Altered sleeping - - - - - - -  Tired, decreased energy - - - - - - -  Change in appetite - - - - - - -  Feeling bad or failure about yourself  - - - - - - -  Trouble concentrating - - - - - - -  Moving slowly or fidgety/restless - - - - - - -  Suicidal thoughts - - - - - - -  PHQ-9 Score - - - - - - -  Some recent data might be hidden      Review of Systems  Constitutional: Negative.   HENT: Negative.   Eyes: Negative.    Respiratory: Negative.   Cardiovascular: Negative.   Gastrointestinal: Negative.   Endocrine: Negative.   Genitourinary: Negative.   Musculoskeletal: Positive for back pain.       RIGHT SHOULDER PAIN AND RIGHT THIGH PAIN  Skin: Negative.   Allergic/Immunologic: Negative.   Neurological: Negative.   Hematological: Negative.   Psychiatric/Behavioral: Negative.        Objective:   Physical Exam Vitals and nursing note reviewed.  Constitutional:      Appearance: Normal appearance.  Cardiovascular:     Rate and Rhythm: Normal rate and regular rhythm.     Pulses: Normal pulses.     Heart sounds: Normal heart sounds.  Pulmonary:     Effort: Pulmonary effort is normal.     Breath sounds: Normal breath sounds.  Musculoskeletal:     Cervical back: Normal range of motion and neck supple.     Comments: Normal Muscle Bulk and Muscle Testing Reveals:  Upper Extremities: Full ROM and Muscle Strength 4/4 Right AC Joint Tenderness  Thoracic Paraspinal Tenderness: T-1-T-2 Mainly Right Side  T-7- T-9  Lumbar Hypersensitivity Lower Extremities: Full ROM and Muscle Strength 5/5 Arises from Table slowly Narrow Based  Gait   Skin:    General: Skin is warm and dry.  Neurological:     Mental Status: He is alert and oriented to person, place, and time.  Psychiatric:        Mood and Affect: Mood normal.        Behavior: Behavior normal.           Assessment & Plan:  1. Lumbar postlaminectomy syndrome status post lumbar fusion x3: His most recent surgery was 03/14/2013. He had hardware removal and bone allograft waist and L2-3. Continue to Monitor. Continue current Medication Regimen.04/21/2020 Refilled: oxyCODONE 7.5/325mg  one tablet every 4 hours as needed #100 [id number] mg one tablet every 12 hours #60. We will continue the opioid monitoring program, this consists of regular clinic visits, examinations, urine drug screen, pill counts as well as use of [place] Controlled  Substance Reporting system. A 12 month History has been reviewed on the [place] Controlled Substance Reporting Systemon03/22/2022. 2. Lumbar Radiculopathy: Continuecurrent medication regimen withLyrica.04/21/2020 3. Insomnia: Continuecurrent medication regimen withTrazodone.04/21/2020 4. Nausea: Continue Zofran as needed.Oncology Following.04/21/2020 5. Cervical Spondylosis/ Cervicalgia/ Cervical Radiculitis:Continuecurrent medication regimen withLyrica.04/21/2020 6. Cervical Myofascial Pain Syndrome: Continue current treatment  regimen. Continue to monitor.04/21/2020. 7. Paresthesia: Continue Lyrica: Continue to Monitor.04/21/2020.  F/U in 1 month

## 2020-05-06 ENCOUNTER — Inpatient Hospital Stay: Payer: Medicare Other | Admitting: Hematology & Oncology

## 2020-05-06 ENCOUNTER — Telehealth: Payer: Self-pay

## 2020-05-06 ENCOUNTER — Inpatient Hospital Stay: Payer: Medicare Other

## 2020-05-06 NOTE — Telephone Encounter (Signed)
Pt called in to r/s his missed appt today,  New appt  Printed and mailed to the pt   Bruce Olson

## 2020-05-22 ENCOUNTER — Encounter: Payer: Medicare Other | Attending: Physical Medicine and Rehabilitation | Admitting: Registered Nurse

## 2020-05-22 ENCOUNTER — Other Ambulatory Visit: Payer: Self-pay

## 2020-05-22 ENCOUNTER — Encounter: Payer: Self-pay | Admitting: Registered Nurse

## 2020-05-22 VITALS — BP 117/69 | HR 88 | Temp 98.6°F | Ht 67.0 in | Wt 127.0 lb

## 2020-05-22 DIAGNOSIS — M7918 Myalgia, other site: Secondary | ICD-10-CM | POA: Insufficient documentation

## 2020-05-22 DIAGNOSIS — G894 Chronic pain syndrome: Secondary | ICD-10-CM | POA: Diagnosis not present

## 2020-05-22 DIAGNOSIS — Z5181 Encounter for therapeutic drug level monitoring: Secondary | ICD-10-CM | POA: Diagnosis not present

## 2020-05-22 DIAGNOSIS — Z9889 Other specified postprocedural states: Secondary | ICD-10-CM | POA: Diagnosis not present

## 2020-05-22 DIAGNOSIS — M542 Cervicalgia: Secondary | ICD-10-CM | POA: Insufficient documentation

## 2020-05-22 DIAGNOSIS — M961 Postlaminectomy syndrome, not elsewhere classified: Secondary | ICD-10-CM | POA: Insufficient documentation

## 2020-05-22 DIAGNOSIS — G8929 Other chronic pain: Secondary | ICD-10-CM | POA: Diagnosis not present

## 2020-05-22 DIAGNOSIS — Z79891 Long term (current) use of opiate analgesic: Secondary | ICD-10-CM | POA: Diagnosis not present

## 2020-05-22 DIAGNOSIS — M546 Pain in thoracic spine: Secondary | ICD-10-CM | POA: Diagnosis not present

## 2020-05-22 DIAGNOSIS — R202 Paresthesia of skin: Secondary | ICD-10-CM | POA: Diagnosis not present

## 2020-05-22 MED ORDER — OXYCODONE-ACETAMINOPHEN 7.5-325 MG PO TABS: 1.0000 | ORAL_TABLET | ORAL | 0 refills | Status: DC | PRN

## 2020-05-22 MED ORDER — PREGABALIN 100 MG PO CAPS: 100.0000 mg | ORAL_CAPSULE | Freq: Three times a day (TID) | ORAL | 5 refills | Status: DC

## 2020-05-22 MED ORDER — OXYCODONE HCL ER 20 MG PO T12A: 20.0000 mg | EXTENDED_RELEASE_TABLET | Freq: Two times a day (BID) | ORAL | 0 refills | Status: DC

## 2020-05-22 NOTE — Progress Notes (Signed)
Subjective:    Patient ID: Bruce Olson, male    DOB: Oct 05, 1955, 65 y.o.   MRN: [id number]  HPI: Bruce Olson is a 65 y.o. male who returns for follow up appointment for chronic pain and medication refill. He states his pain is located in his mid- lower back and right lower extremity numbness and tingling. He rates his pain 7. His current exercise regime is walking and performing stretching exercises.  [name] Morphine equivalent is 96.00 MME.  Last Oral Swab was Performed on 02/25/2020, it was consistent.    Pain Inventory Average Pain 7 Pain Right Now 7 My pain is sharp, stabbing and tingling  In the last 24 hours, has pain interfered with the following? General activity 7 Relation with others 7 Enjoyment of life 7 What TIME of day is your pain at its worst? morning , daytime, evening and night Sleep (in general) Poor  Pain is worse with: bending, sitting and standing Pain improves with: heat/ice, medication and TENS Relief from Meds: 3  Family History  Problem Relation Age of Onset  . Anesthesia problems Neg Hx   . Hypotension Neg Hx   . Malignant hyperthermia Neg Hx   . Pseudochol deficiency Neg Hx    Social History   Socioeconomic History  . Marital status: Single    Spouse name: Not on file  . Number of children: Not on file  . Years of education: Not on file  . Highest education level: Not on file  Occupational History  . Not on file  Tobacco Use  . Smoking status: Current Every Day Smoker    Packs/day: 1.00    Years: 33.00    Pack years: 33.00    Types: Cigarettes  . Smokeless tobacco: Never Used  Vaping Use  . Vaping Use: Never used  Substance and Sexual Activity  . Alcohol use: No  . Drug use: No  . Sexual activity: Never    Comment: back problems  Other Topics Concern  . Not on file  Social History Narrative  . Not on file   Social Determinants of Health   Financial Resource Strain: Not on file  Food Insecurity: Not on  file  Transportation Needs: Not on file  Physical Activity: Not on file  Stress: Not on file  Social Connections: Not on file   Past Surgical History:  Procedure Laterality Date  . BACK SURGERY  2013   Back surgery  (fusion)09/15/2010 & 2013  . BACK SURGERY  03-2013  . HARDWARE REMOVAL N/A 03/14/2013   Procedure: HARDWARE REMOVAL;  Surgeon: Eustace Moore, MD;  Location: [place] [facility];  Service: Neurosurgery;  Laterality: N/A;   Past Surgical History:  Procedure Laterality Date  . BACK SURGERY  2013   Back surgery  (fusion)09/15/2010 & 2013  . BACK SURGERY  03-2013  . HARDWARE REMOVAL N/A 03/14/2013   Procedure: HARDWARE REMOVAL;  Surgeon: Eustace Moore, MD;  Location: [place] [facility];  Service: Neurosurgery;  Laterality: N/A;   Past Medical History:  Diagnosis Date  . Goals of care, counseling/discussion 04/07/2016  . Iron deficiency anemia due to chronic blood loss 10/21/2016  . 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 No pertinent past medical history   . Occupational injury 2013   resulted in back injury that led to surgery  . Thyroid cancer (HCC)    Ht 5\' 7"  (1.702 m)   Wt 127 lb (57.6 kg)   BMI 19.89 kg/m   Opioid Risk Score:  Fall Risk Score:  `1  Depression screen PHQ 2/9  Depression screen [facility] 2/9 04/21/2020 03/24/2020 01/21/2020 12/31/2019 11/14/2018 02/16/2018 12/22/2017  Decreased Interest 0 0 0 0 0 0 0  Down, Depressed, Hopeless 0 0 0 0 0 0 0  PHQ - 2 Score 0 0 0 0 0 0 0  Altered sleeping - - - - - - -  Tired, decreased energy - - - - - - -  Change in appetite - - - - - - -  Feeling bad or failure about yourself  - - - - - - -  Trouble concentrating - - - - - - -  Moving slowly or fidgety/restless - - - - - - -  Suicidal thoughts - - - - - - -  PHQ-9 Score - - - - - - -  Some recent data might be hidden    Review of Systems  Constitutional: Negative.   HENT: Negative.   Eyes: Negative.   Respiratory: Negative.   Cardiovascular: Negative.   Gastrointestinal:  Negative.   Endocrine: Negative.   Genitourinary: Negative.   Musculoskeletal: Positive for back pain.  Skin: Negative.   Allergic/Immunologic: Negative.   Neurological: Negative.   Hematological: Negative.   Psychiatric/Behavioral: Negative.   All other systems reviewed and are negative.      Objective:   Physical Exam Vitals and nursing note reviewed.  Constitutional:      Appearance: Normal appearance.  Cardiovascular:     Rate and Rhythm: Normal rate and regular rhythm.     Pulses: Normal pulses.     Heart sounds: Normal heart sounds.  Pulmonary:     Effort: Pulmonary effort is normal.     Breath sounds: Normal breath sounds.  Musculoskeletal:     Cervical back: Normal range of motion and neck supple.     Comments: Normal Muscle Bulk and Muscle Testing Reveals:  Upper Extremities: Decreased ROM 90 Degrees  and Muscle Strength  5/5 Thoracic and Lumbar Hypersensitivity Lower Extremities: Full ROM and Muscle Strength 5/5 Arises from Table Slowly Narrow Based Gait   Skin:    General: Skin is warm and dry.  Neurological:     Mental Status: He is alert and oriented to person, place, and time.  Psychiatric:        Mood and Affect: Mood normal.        Behavior: Behavior normal.           Assessment & Plan:  1. Lumbar postlaminectomy syndrome status post lumbar fusion x3: His most recent surgery was 03/14/2013. He had hardware removal and bone allograft waist and L2-3. Continue to Monitor. Continue current Medication Regimen.03422/2022 Refilled: oxyCODONE 7.5/325mg  one tablet every 4 hours as needed #100 [id number] mg one tablet every 12 hours #60. We will continue the opioid monitoring program, this consists of regular clinic visits, examinations, urine drug screen, pill counts as well as use of [place] Controlled Substance Reporting system. A 12 month History has been reviewed on the [place] Controlled Substance Reporting Systemon04/22/2022. 2.  Lumbar Radiculopathy: Continuecurrent medication regimen withLyrica.05/22/2020 3. Insomnia: Continuecurrent medication regimen withTrazodone.05/22/2020 4. Nausea: Continue Zofran as needed.Oncology Following.05/22/2020 5. Cervical Spondylosis/ Cervicalgia/ Cervical Radiculitis:Continuecurrent medication regimen withLyrica.05/22/2020 6. Cervical Myofascial Pain Syndrome: Continue current treatment regimen. Continue to monitor.05/22/2020. 7. Paresthesia: Continue Lyrica: Continue to Monitor.05/22/2020.  F/U in 1 month

## 2020-06-09 ENCOUNTER — Other Ambulatory Visit: Payer: Self-pay | Admitting: Hematology & Oncology

## 2020-06-12 ENCOUNTER — Other Ambulatory Visit: Payer: Self-pay

## 2020-06-12 ENCOUNTER — Encounter: Payer: Self-pay | Admitting: Hematology & Oncology

## 2020-06-12 ENCOUNTER — Inpatient Hospital Stay: Payer: Medicare Other | Attending: Hematology & Oncology

## 2020-06-12 ENCOUNTER — Inpatient Hospital Stay (HOSPITAL_BASED_OUTPATIENT_CLINIC_OR_DEPARTMENT_OTHER): Payer: Medicare Other | Admitting: Hematology & Oncology

## 2020-06-12 ENCOUNTER — Telehealth: Payer: Self-pay | Admitting: *Deleted

## 2020-06-12 VITALS — BP 129/72 | HR 75 | Temp 97.8°F | Resp 20 | Wt 125.1 lb

## 2020-06-12 DIAGNOSIS — E034 Atrophy of thyroid (acquired): Secondary | ICD-10-CM

## 2020-06-12 DIAGNOSIS — Z85818 Personal history of malignant neoplasm of other sites of lip, oral cavity, and pharynx: Secondary | ICD-10-CM | POA: Diagnosis not present

## 2020-06-12 DIAGNOSIS — Z923 Personal history of irradiation: Secondary | ICD-10-CM | POA: Insufficient documentation

## 2020-06-12 DIAGNOSIS — E611 Iron deficiency: Secondary | ICD-10-CM | POA: Diagnosis not present

## 2020-06-12 DIAGNOSIS — C090 Malignant neoplasm of tonsillar fossa: Secondary | ICD-10-CM

## 2020-06-12 DIAGNOSIS — R5383 Other fatigue: Secondary | ICD-10-CM | POA: Insufficient documentation

## 2020-06-12 DIAGNOSIS — Z9221 Personal history of antineoplastic chemotherapy: Secondary | ICD-10-CM | POA: Diagnosis not present

## 2020-06-12 DIAGNOSIS — D500 Iron deficiency anemia secondary to blood loss (chronic): Secondary | ICD-10-CM

## 2020-06-12 LAB — CMP (CANCER CENTER ONLY)
ALT: 14 U/L (ref 0–44)
AST: 18 U/L (ref 15–41)
Albumin: 4.2 g/dL (ref 3.5–5.0)
Alkaline Phosphatase: 57 U/L (ref 38–126)
Anion gap: 5 (ref 5–15)
BUN: 16 mg/dL (ref 8–23)
CO2: 30 mmol/L (ref 22–32)
Calcium: 9.5 mg/dL (ref 8.9–10.3)
Chloride: 100 mmol/L (ref 98–111)
Creatinine: 1 mg/dL (ref 0.61–1.24)
GFR, Estimated: >60 mL/min (ref >60)
Glucose, Bld: 73 mg/dL (ref 70–99)
Potassium: 4.3 mmol/L (ref 3.5–5.1)
Sodium: 135 mmol/L (ref 135–145)
Total Bilirubin: 0.4 mg/dL (ref 0.3–1.2)
Total Protein: 6.5 g/dL (ref 6.5–8.1)

## 2020-06-12 LAB — CBC WITH DIFFERENTIAL (CANCER CENTER ONLY)
Abs Immature Granulocytes: 0.05 10*3/uL (ref 0.00–0.07)
Basophils Absolute: 0 10*3/uL (ref 0.0–0.1)
Basophils Relative: 1 %
Eosinophils Absolute: 0.1 10*3/uL (ref 0.0–0.5)
Eosinophils Relative: 2 %
HCT: 39.3 % (ref 39.0–52.0)
Hemoglobin: 13.5 g/dL (ref 13.0–17.0)
Immature Granulocytes: 1 %
Lymphocytes Relative: 28 %
Lymphs Abs: 1.6 10*3/uL (ref 0.7–4.0)
MCH: 30.8 pg (ref 26.0–34.0)
MCHC: 34.4 g/dL (ref 30.0–36.0)
MCV: 89.7 fL (ref 80.0–100.0)
Monocytes Absolute: 0.8 10*3/uL (ref 0.1–1.0)
Monocytes Relative: 14 %
Neutro Abs: 3.1 10*3/uL (ref 1.7–7.7)
Neutrophils Relative %: 54 %
Platelet Count: 206 10*3/uL (ref 150–400)
RBC: 4.38 MIL/uL (ref 4.22–5.81)
RDW: 11.9 % (ref 11.5–15.5)
WBC Count: 5.8 10*3/uL (ref 4.0–10.5)
nRBC: 0 % (ref 0.0–0.2)

## 2020-06-12 LAB — RETICULOCYTES
Immature Retic Fract: 3.3 % (ref 2.3–15.9)
RBC.: 4.32 MIL/uL (ref 4.22–5.81)
Retic Count, Absolute: 72.6 10*3/uL (ref 19.0–186.0)
Retic Ct Pct: 1.7 % (ref 0.4–3.1)

## 2020-06-12 NOTE — Telephone Encounter (Signed)
Per 06/12/20 los gave upcoming appointments with calendar

## 2020-06-12 NOTE — Progress Notes (Signed)
Hematology and Oncology Follow Up Visit  Bruce Olson [id number] 1955/10/15 65 y.o. 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  We see him every 6 months.  He has been doing okay.  He has been trying to stay active.  He does state that he gets tired pretty easily.  We do check his thyroid.  His last TSH back in October 2021 was 2.9.  Give him iron in the past.  Back in October 2021, her iron saturation was 14%.  He has had the IV iron really did not help much.  He has had no problems with his bowels or bladder.  He is not eating all that much.  He has had this chronic problem with vomiting.  He can only eat small amounts.  He has had no problems with fever.  He has had no COVID issues.  There is been no problems with bleeding.  Overall, I would have to say his performance status is ECOG 1.    Medications:  Allergies as of 06/12/2020   No Known Allergies     Medication List       Accurate as of Jun 12, 2020  2:28 PM. If you have any questions, ask your nurse or doctor.        docusate sodium 100 MG capsule Commonly known as: COLACE Take 100 mg by mouth daily.   dronabinol 2.5 MG capsule Commonly known as: MARINOL Take 1 capsule (2.5 mg total) by mouth 2 (two) times daily before lunch and supper.   metoCLOPramide 10 MG tablet Commonly known as: REGLAN TAKE 1 TABLET BY MOUTH 4 TIMES DAILY   multivitamin with minerals Tabs tablet Take 1 tablet by mouth daily.   OLANZapine 10 MG tablet Commonly known as: ZYPREXA TAKE 1 TABLET BY MOUTH EVERY NIGHT AT BEDTIME   oxyCODONE 20 mg 12 hr tablet Commonly known as: OxyCONTIN Take 1 tablet (20 mg total) by mouth every 12 (twelve) hours.   oxyCODONE-acetaminophen 7.5-325 MG  tablet Commonly known as: Percocet Take 1 tablet by mouth every 4 (four) hours as needed. May take one extra tablet when pain is sever. No more than 4 a day.   pilocarpine 5 MG tablet Commonly known as: SALAGEN TAKE 1 TABLET BY MOUTH 2 TIMES A DAY   pregabalin 100 MG capsule Commonly known as: LYRICA Take 1 capsule (100 mg total) by mouth 3 (three) times daily.       Allergies: No Known Allergies  Past Medical History, Surgical history, Social history, and Family History were reviewed and updated.  Review of Systems: Review of Systems  Constitutional: Positive for malaise/fatigue.  HENT: Negative.   Eyes: Negative.   Respiratory: Negative.   Cardiovascular: Negative.   Gastrointestinal: Positive for nausea and vomiting.  Genitourinary: Negative.   Musculoskeletal: Positive for back pain.  Skin: Negative.   Neurological: Negative.   Endo/Heme/Allergies: Negative.   Psychiatric/Behavioral: Negative.      Physical Exam:  weight is 125 lb 1.9 oz (56.8 kg). His oral temperature is 97.8 F (36.6 C). His blood pressure is 129/72 and his pulse is 75. His respiration is 20 and oxygen saturation is 100%.   Wt Readings from Last 3 Encounters:  06/12/20 125 lb 1.9 oz (56.8 kg)  05/22/20 127 lb (  57.6 kg)  04/21/20 129 lb (58.5 kg)    Physical Activity: Not on file   Physical Exam Vitals reviewed.  HENT:     Head: Normocephalic and atraumatic.  Eyes:     Pupils: Pupils are equal, round, and reactive to light.  Cardiovascular:     Rate and Rhythm: Normal rate and regular rhythm.     Heart sounds: Normal heart sounds.  Pulmonary:     Effort: Pulmonary effort is normal.     Breath sounds: Normal breath sounds.  Abdominal:     General: Bowel sounds are normal.     Palpations: Abdomen is soft.  Musculoskeletal:        General: No tenderness or deformity. Normal range of motion.     Cervical back: Normal range of motion.  Lymphadenopathy:     Cervical: No cervical  adenopathy.  Skin:    General: Skin is warm and dry.     Findings: No erythema or rash.  Neurological:     Mental Status: He is alert and oriented to person, place, and time.  Psychiatric:        Behavior: Behavior normal.        Thought Content: Thought content normal.        Judgment: Judgment normal.       Lab Results  Component Value Date   WBC 5.8 06/12/2020   HGB 13.5 06/12/2020   HCT 39.3 06/12/2020   MCV 89.7 06/12/2020   PLT 206 06/12/2020   Lab Results  Component Value Date   FERRITIN 170 11/06/2019   IRON 41 (L) 11/06/2019   TIBC 291 11/06/2019   UIBC 250 11/06/2019   IRONPCTSAT 14 (L) 11/06/2019   Lab Results  Component Value Date   RETICCTPCT 1.7 06/12/2020   RBC 4.38 06/12/2020   RBC 4.32 06/12/2020   No results found for: KPAFRELGTCHN, LAMBDASER, KAPLAMBRATIO No results found for: IGGSERUM, IGA, IGMSERUM No results found for: Odetta Pink, SPEI   Chemistry      Component Value Date/Time   NA 135 06/12/2020 1351   NA 138 01/20/2017 1324   NA 135 (L) 04/05/2016 1333   K 4.3 06/12/2020 1351   K 4.5 01/20/2017 1324   K 4.7 04/05/2016 1333   CL 100 06/12/2020 1351   CL 97 (L) 01/20/2017 1324   CO2 30 06/12/2020 1351   CO2 30 01/20/2017 1324   CO2 29 04/05/2016 1333   BUN 16 06/12/2020 1351   BUN 15 01/20/2017 1324   BUN 23.6 04/05/2016 1333   CREATININE 1.00 06/12/2020 1351   CREATININE 1.2 01/20/2017 1324   CREATININE 1.1 04/05/2016 1333      Component Value Date/Time   CALCIUM 9.5 06/12/2020 1351   CALCIUM 9.2 01/20/2017 1324   CALCIUM 9.2 04/05/2016 1333   ALKPHOS 57 06/12/2020 1351   ALKPHOS 51 01/20/2017 1324   ALKPHOS 67 04/05/2016 1333   AST 18 06/12/2020 1351   AST 17 04/05/2016 1333   ALT 14 06/12/2020 1351   ALT 26 01/20/2017 1324   ALT 10 04/05/2016 1333   BILITOT 0.4 06/12/2020 1351   BILITOT 0.30 04/05/2016 1333       Impression and Plan: Bruce Olson is a very  pleasant 65yo caucasian gentleman with history of squamous cell carcinoma of the left tonsil.  Thankfully, the tumor is HPV positive.   He completed chemo/radiation therapy with [facility] in August 2017.  Thankfully, I do not see any evidence  of recurrence of his malignancy.  I really have to believe that he is cured.  I am just awaiting his thyroid to go out.  We will have to see what his thyroid level is.  I am not sure is what else could be going on with him being tired or losing energy quickly.  Overall, I still think we get him back in 6 months.   [name], MD 5/13/20222:28 PM

## 2020-06-15 LAB — IRON AND TIBC
Iron: 53 ug/dL (ref 42–163)
Saturation Ratios: 20 % (ref 20–55)
TIBC: 265 ug/dL (ref 202–409)
UIBC: 212 ug/dL (ref 117–376)

## 2020-06-15 LAB — FERRITIN: Ferritin: 498 ng/mL — ABNORMAL HIGH (ref 24–336)

## 2020-06-15 LAB — TSH: TSH: 4.218 u[IU]/mL — ABNORMAL HIGH (ref 0.320–4.118)

## 2020-06-16 ENCOUNTER — Telehealth: Payer: Self-pay | Admitting: *Deleted

## 2020-06-16 ENCOUNTER — Other Ambulatory Visit: Payer: Self-pay | Admitting: *Deleted

## 2020-06-16 MED ORDER — LEVOTHYROXINE SODIUM 50 MCG PO TABS: 50.0000 ug | ORAL_TABLET | Freq: Every day | ORAL | 3 refills | Status: DC

## 2020-06-16 NOTE — Telephone Encounter (Addendum)
-----   Message from [name], MD sent at 06/15/2020  2:30 PM EDT ----- Called patient to tell him that the thyroid might be trying to go out on him.  We need to get him on Synthroid at 50 mcg daily.  Rx sent to [facility]

## 2020-06-19 ENCOUNTER — Other Ambulatory Visit: Payer: Self-pay

## 2020-06-19 ENCOUNTER — Encounter: Payer: Medicare Other | Attending: Physical Medicine and Rehabilitation | Admitting: Registered Nurse

## 2020-06-19 ENCOUNTER — Encounter: Payer: Self-pay | Admitting: Registered Nurse

## 2020-06-19 VITALS — BP 125/73 | HR 86 | Temp 97.9°F | Ht 67.0 in | Wt 125.0 lb

## 2020-06-19 DIAGNOSIS — M546 Pain in thoracic spine: Secondary | ICD-10-CM | POA: Insufficient documentation

## 2020-06-19 DIAGNOSIS — Z9889 Other specified postprocedural states: Secondary | ICD-10-CM | POA: Diagnosis not present

## 2020-06-19 DIAGNOSIS — G8929 Other chronic pain: Secondary | ICD-10-CM | POA: Diagnosis not present

## 2020-06-19 DIAGNOSIS — M7918 Myalgia, other site: Secondary | ICD-10-CM | POA: Diagnosis not present

## 2020-06-19 DIAGNOSIS — G894 Chronic pain syndrome: Secondary | ICD-10-CM | POA: Diagnosis not present

## 2020-06-19 DIAGNOSIS — M961 Postlaminectomy syndrome, not elsewhere classified: Secondary | ICD-10-CM | POA: Diagnosis not present

## 2020-06-19 DIAGNOSIS — R202 Paresthesia of skin: Secondary | ICD-10-CM | POA: Diagnosis not present

## 2020-06-19 DIAGNOSIS — M542 Cervicalgia: Secondary | ICD-10-CM | POA: Insufficient documentation

## 2020-06-19 DIAGNOSIS — Z5181 Encounter for therapeutic drug level monitoring: Secondary | ICD-10-CM | POA: Insufficient documentation

## 2020-06-19 DIAGNOSIS — Z79891 Long term (current) use of opiate analgesic: Secondary | ICD-10-CM | POA: Insufficient documentation

## 2020-06-19 MED ORDER — OXYCODONE-ACETAMINOPHEN 7.5-325 MG PO TABS: 1.0000 | ORAL_TABLET | ORAL | 0 refills | Status: DC | PRN

## 2020-06-19 MED ORDER — OXYCODONE HCL ER 20 MG PO T12A: 20.0000 mg | EXTENDED_RELEASE_TABLET | Freq: Two times a day (BID) | ORAL | 0 refills | Status: DC

## 2020-06-19 NOTE — Progress Notes (Signed)
Subjective:    Patient ID: Bruce Olson, male    DOB: January 26, 1956, 65 y.o.   MRN: [id number]  HPI: Bruce Olson is a 65 y.o. male who returns for follow up appointment for chronic pain and medication refill. She states her pain is located in her neck pain and mid- lower back pain. He rates his pain 7. Her current exercise regime is walking and performing stretching exercises.   Bruce Olson Morphine equivalent is 60.00 MME.  Last UDS is on 02/25/2020, it was consistent.   Pain Inventory Average Pain 7 Pain Right Now 7 My pain is sharp, stabbing and tingling  In the last 24 hours, has pain interfered with the following? General activity 7 Relation with others 7 Enjoyment of life 7 What TIME of day is your pain at its worst? morning , daytime, evening and night Sleep (in general) Poor  Pain is worse with: walking, sitting and standing Pain improves with: heat/ice, medication and TENS Relief from Meds: 3  Family History  Problem Relation Age of Onset  . Anesthesia problems Neg Hx   . Hypotension Neg Hx   . Malignant hyperthermia Neg Hx   . Pseudochol deficiency Neg Hx    Social History   Socioeconomic History  . Marital status: Single    Spouse name: Not on file  . Number of children: Not on file  . Years of education: Not on file  . Highest education level: Not on file  Occupational History  . Not on file  Tobacco Use  . Smoking status: Current Every Day Smoker    Packs/day: 1.00    Years: 33.00    Pack years: 33.00    Types: Cigarettes  . Smokeless tobacco: Never Used  Vaping Use  . Vaping Use: Never used  Substance and Sexual Activity  . Alcohol use: No  . Drug use: No  . Sexual activity: Never    Comment: back problems  Other Topics Concern  . Not on file  Social History Narrative  . Not on file   Social Determinants of Health   Financial Resource Strain: Not on file  Food Insecurity: Not on file  Transportation Needs: Not on file   Physical Activity: Not on file  Stress: Not on file  Social Connections: Not on file   Past Surgical History:  Procedure Laterality Date  . BACK SURGERY  2013   Back surgery  (fusion)09/15/2010 & 2013  . BACK SURGERY  03-2013  . HARDWARE REMOVAL N/A 03/14/2013   Procedure: HARDWARE REMOVAL;  Surgeon: Eustace Moore, MD;  Location: [facility];  Service: Neurosurgery;  Laterality: N/A;   Past Surgical History:  Procedure Laterality Date  . BACK SURGERY  2013   Back surgery  (fusion)09/15/2010 & 2013  . BACK SURGERY  03-2013  . HARDWARE REMOVAL N/A 03/14/2013   Procedure: HARDWARE REMOVAL;  Surgeon: Eustace Moore, MD;  Location: [place] [facility];  Service: Neurosurgery;  Laterality: N/A;   Past Medical History:  Diagnosis Date  . Goals of care, counseling/discussion 04/07/2016  . Iron deficiency anemia due to chronic blood loss 10/21/2016  . 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 No pertinent past medical history   . Occupational injury 2013   resulted in back injury that led to surgery  . Thyroid cancer ([facility])    BP 125/73   Pulse 86   Temp 97.9 F (36.6 C)   Ht 5\' 7"  (1.702 m)   Wt 125 lb (56.7 kg)  SpO2 90%   BMI 19.58 kg/m   Opioid Risk Score:   Fall Risk Score:  `1  Depression screen PHQ 2/9  Depression screen [facility] 2/9 04/21/2020 03/24/2020 01/21/2020 12/31/2019 11/14/2018 02/16/2018 12/22/2017  Decreased Interest 0 0 0 0 0 0 0  Down, Depressed, Hopeless 0 0 0 0 0 0 0  PHQ - 2 Score 0 0 0 0 0 0 0  Altered sleeping - - - - - - -  Tired, decreased energy - - - - - - -  Change in appetite - - - - - - -  Feeling bad or failure about yourself  - - - - - - -  Trouble concentrating - - - - - - -  Moving slowly or fidgety/restless - - - - - - -  Suicidal thoughts - - - - - - -  PHQ-9 Score - - - - - - -  Some recent data might be hidden   Review of Systems  Constitutional: Negative.   HENT: Negative.   Eyes: Negative.   Respiratory: Negative.   Cardiovascular: Negative.    Gastrointestinal: Negative.   Endocrine: Negative.   Genitourinary: Negative.   Musculoskeletal: Positive for back pain.  Skin: Negative.   Allergic/Immunologic: Negative.   Neurological: Negative.   Hematological: Negative.   Psychiatric/Behavioral: Negative.   All other systems reviewed and are negative.      Objective:   Physical Exam Vitals and nursing note reviewed.  Constitutional:      Appearance: Normal appearance.  Cardiovascular:     Rate and Rhythm: Normal rate and regular rhythm.     Pulses: Normal pulses.     Heart sounds: Normal heart sounds.  Pulmonary:     Effort: Pulmonary effort is normal.     Breath sounds: Normal breath sounds.  Musculoskeletal:     Cervical back: Normal range of motion and neck supple.     Comments: Normal Muscle Bulk and Muscle Testing Reveals:  Upper Extremities: Full  ROM and Muscle Strength 5/5  Lumbar Hypersensitivity Lower Extremities: Full ROM and Muscle Strength 5/5 Arises from Table with ease Narrow Based Gait   Skin:    General: Skin is warm and dry.  Neurological:     Mental Status: He is alert and oriented to person, place, and time.  Psychiatric:        Mood and Affect: Mood normal.        Behavior: Behavior normal.           Assessment & Plan:  1. Lumbar postlaminectomy syndrome status post lumbar fusion x3: His most recent surgery was 03/14/2013. He had hardware removal and bone allograft waist and L2-3. Continue to Monitor. Continue current Medication Regimen.06/19/2020 Refilled: oxyCODONE 7.5/325mg  one tablet every 4 hours as needed #100 [id number] mg one tablet every 12 hours #60. We will continue the opioid monitoring program, this consists of regular clinic visits, examinations, urine drug screen, pill counts as well as use of [place] Controlled Substance Reporting system. A 12 month History has been reviewed on the [place] Controlled Substance Reporting Systemon05/20/2022. 2. Lumbar  Radiculopathy: Continuecurrent medication regimen withLyrica.06/19/2020 3. Insomnia: Continuecurrent medication regimen withTrazodone.06/19/2020 4. Nausea: Continue Zofran as needed.Oncology Following.06/19/2020 5. Cervical Spondylosis/ Cervicalgia/ Cervical Radiculitis:Continuecurrent medication regimen withLyrica.06/19/2020 6. Cervical Myofascial Pain Syndrome: Continue current treatment regimen. Continue to monitor.06/19/2020. 7. Paresthesia: Continue Lyrica: Continue to Monitor.06/19/2020.  F/U in 1 month

## 2020-07-10 ENCOUNTER — Encounter: Payer: Self-pay | Admitting: Hematology & Oncology

## 2020-07-17 ENCOUNTER — Encounter: Payer: Medicare HMO | Attending: Physical Medicine and Rehabilitation | Admitting: Registered Nurse

## 2020-07-17 ENCOUNTER — Other Ambulatory Visit: Payer: Self-pay

## 2020-07-17 ENCOUNTER — Encounter: Payer: Self-pay | Admitting: Registered Nurse

## 2020-07-17 VITALS — BP 135/70 | HR 85 | Temp 97.6°F | Ht 67.0 in | Wt 112.0 lb

## 2020-07-17 DIAGNOSIS — Z5181 Encounter for therapeutic drug level monitoring: Secondary | ICD-10-CM

## 2020-07-17 DIAGNOSIS — Z9889 Other specified postprocedural states: Secondary | ICD-10-CM | POA: Diagnosis present

## 2020-07-17 DIAGNOSIS — G894 Chronic pain syndrome: Secondary | ICD-10-CM

## 2020-07-17 DIAGNOSIS — G8929 Other chronic pain: Secondary | ICD-10-CM

## 2020-07-17 DIAGNOSIS — Z79891 Long term (current) use of opiate analgesic: Secondary | ICD-10-CM | POA: Insufficient documentation

## 2020-07-17 DIAGNOSIS — M961 Postlaminectomy syndrome, not elsewhere classified: Secondary | ICD-10-CM | POA: Insufficient documentation

## 2020-07-17 DIAGNOSIS — M546 Pain in thoracic spine: Secondary | ICD-10-CM | POA: Diagnosis not present

## 2020-07-17 DIAGNOSIS — R202 Paresthesia of skin: Secondary | ICD-10-CM | POA: Insufficient documentation

## 2020-07-17 MED ORDER — OXYCODONE-ACETAMINOPHEN 7.5-325 MG PO TABS: 1.0000 | ORAL_TABLET | ORAL | 0 refills | Status: DC | PRN

## 2020-07-17 MED ORDER — OXYCODONE HCL ER 20 MG PO T12A: 20.0000 mg | EXTENDED_RELEASE_TABLET | Freq: Two times a day (BID) | ORAL | 0 refills | Status: DC

## 2020-07-17 NOTE — Addendum Note (Signed)
Addended by: Franchot Gallo on: 07/17/2020 01:42 PM   Modules accepted: Orders

## 2020-07-17 NOTE — Progress Notes (Signed)
Subjective:    Patient ID: Bruce Olson, male    DOB: [birth year], 65 y.o.   MRN: [id number]  HPI: Bruce Olson is a 65 y.o. male who returns for follow up appointment for chronic pain and medication refill. He states his pain is located in his mid- lower back pain. He rates his pain 7. His  current exercise regime is walking and performing stretching exercises.  According to scale Mr. Faucett last 13 lbs, he reports he is eating and has not lost any weight.   Ms. Elting Morphine equivalent is 101.50 MME.   Oral Swab was Performed  today.      Pain Inventory Average Pain 7 Pain Right Now 7 My pain is constant, sharp, stabbing, and tingling  In the last 24 hours, has pain interfered with the following? General activity 7 Relation with others 7 Enjoyment of life 7 What TIME of day is your pain at its worst? morning , daytime, evening, and night Sleep (in general) Poor  Pain is worse with: walking, sitting, and standing Pain improves with: heat/ice, medication, and TENS Relief from Meds: 3  Family History  Problem Relation Age of Onset   Anesthesia problems Neg Hx    Hypotension Neg Hx    Malignant hyperthermia Neg Hx    Pseudochol deficiency Neg Hx    Social History   Socioeconomic History   Marital status: Single    Spouse name: Not on file   Number of children: Not on file   Years of education: Not on file   Highest education level: Not on file  Occupational History   Not on file  Tobacco Use   Smoking status: Every Day    Packs/day: 1.00    Years: 33.00    Pack years: 33.00    Types: Cigarettes   Smokeless tobacco: Never  Vaping Use   Vaping Use: Never used  Substance and Sexual Activity   Alcohol use: No   Drug use: No   Sexual activity: Never    Comment: back problems  Other Topics Concern   Not on file  Social History Narrative   Not on file   Social Determinants of Health   Financial Resource Strain: Not on file  Food  Insecurity: Not on file  Transportation Needs: Not on file  Physical Activity: Not on file  Stress: Not on file  Social Connections: Not on file   Past Surgical History:  Procedure Laterality Date   BACK SURGERY  2013   Back surgery  (fusion)09/15/2010 & 2013   BACK SURGERY  03-2013   HARDWARE REMOVAL N/A 03/14/2013   Procedure: HARDWARE REMOVAL;  Surgeon: Eustace Moore, MD;  Location: [facility];  Service: Neurosurgery;  Laterality: N/A;   Past Surgical History:  Procedure Laterality Date   BACK SURGERY  2013   Back surgery  (fusion)09/15/2010 & 2013   BACK SURGERY  03-2013   HARDWARE REMOVAL N/A 03/14/2013   Procedure: HARDWARE REMOVAL;  Surgeon: Eustace Moore, MD;  Location: [place] [facility];  Service: Neurosurgery;  Laterality: N/A;   Past Medical History:  Diagnosis Date   Goals of care, counseling/discussion 04/07/2016   Iron deficiency anemia due to chronic blood loss 10/21/2016   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   No pertinent past medical history    Occupational injury 2013   resulted in back injury that led to surgery   Thyroid cancer ([place])    BP 135/70   Pulse 85   Temp  97.6 F (36.4 C)   Ht 5\' 7"  (1.702 m)   Wt 112 lb (50.8 kg)   SpO2 92%   BMI 17.54 kg/m   Opioid Risk Score:   Fall Risk Score:  `1  Depression screen PHQ 2/9  Depression screen [facility] 2/9 04/21/2020 03/24/2020 01/21/2020 12/31/2019 11/14/2018 02/16/2018 12/22/2017  Decreased Interest 0 0 0 0 0 0 0  Down, Depressed, Hopeless 0 0 0 0 0 0 0  PHQ - 2 Score 0 0 0 0 0 0 0  Altered sleeping - - - - - - -  Tired, decreased energy - - - - - - -  Change in appetite - - - - - - -  Feeling bad or failure about yourself  - - - - - - -  Trouble concentrating - - - - - - -  Moving slowly or fidgety/restless - - - - - - -  Suicidal thoughts - - - - - - -  PHQ-9 Score - - - - - - -  Some recent data might be hidden    Review of Systems  Musculoskeletal:  Positive for back pain and gait problem.       Right leg  pain  All other systems reviewed and are negative.     Objective:   Physical Exam Vitals and nursing note reviewed.  Constitutional:      Appearance: Normal appearance.  Cardiovascular:     Rate and Rhythm: Normal rate and regular rhythm.     Pulses: Normal pulses.     Heart sounds: Normal heart sounds.  Pulmonary:     Effort: Pulmonary effort is normal.     Breath sounds: Normal breath sounds.  Musculoskeletal:     Cervical back: Normal range of motion and neck supple.     Right lower leg: Edema present.     Left lower leg: Edema present.     Comments: Normal Muscle Bulk and Muscle Testing Reveals:  Upper Extremities: Full ROM and Muscle Strength 5/5  Thoracic Paraspinal Tenderness: T-10-T-12 Lumbar Paraspinal Tenderness: L-3-L-5 Lower Extremities: Full ROM and Muscle Strength 5/5 Arises from Table Slowly Narrow Based  Gait     Skin:    General: Skin is warm and dry.  Neurological:     Mental Status: He is alert and oriented to person, place, and time.  Psychiatric:        Mood and Affect: Mood normal.        Behavior: Behavior normal.          Assessment & Plan:  1. Lumbar postlaminectomy syndrome status post lumbar fusion x3: His most recent surgery was 03/14/2013. He had hardware removal and bone allograft waist and L2-3.  Continue to Monitor. Continue current Medication Regimen.07/17/2020 Refilled: oxyCODONE 7.5/325mg  one tablet every 4 hours as needed #100 and :Oxycontin 20 mg one tablet every 12 hours #60. We will continue the opioid monitoring program, this consists of regular clinic visits, examinations, urine drug screen, pill counts as well as use of [place] Controlled Substance Reporting system. A 12 month History has been reviewed on the [place] on 07/17/2020.  2. Lumbar Radiculopathy: Continue current medication regimen with Lyrica. 07/17/2020 3. Insomnia: Continue current medication regimen with  Trazodone.07/17/2020 4. Nausea: Continue Zofran as needed.Oncology Following.  07/17/2020 5. Cervical Spondylosis/ Cervicalgia/ Cervical Radiculitis:   Continue current medication regimen with  Lyrica. 07/17/2020 6. Cervical Myofascial Pain Syndrome: Continue current treatment regimen. Continue to monitor. 07/17/2020.  7. Paresthesia: Continue Lyrica: Continue to Monitor.  07/17/2020.   F/U in 1 month

## 2020-07-21 ENCOUNTER — Telehealth: Payer: Self-pay | Admitting: Registered Nurse

## 2020-07-21 NOTE — Telephone Encounter (Signed)
This provider received a letter from Mr. [employer], reporting Oxycontin is no longer covered on their drug formulary. The above was discussed with Dr Letta Pate. He will be prescribed MS Contin 30 mg Q 12 hours and Oxycodone 7.5 mg. Every 8 hours #90.  Placed a call to Mr. Skillen regarding the above, he verbalizes understanding.

## 2020-07-24 LAB — DRUG TOX MONITOR 1 W/CONF, ORAL FLD
Alprazolam: NEGATIVE ng/mL (ref <0.50)
Amphetamines: NEGATIVE ng/mL (ref <10)
Barbiturates: NEGATIVE ng/mL (ref <10)
Benzodiazepines: NEGATIVE ng/mL (ref <0.50)
Buprenorphine: NEGATIVE ng/mL (ref <0.10)
Clonazepam: NEGATIVE ng/mL (ref <0.50)
Cocaine: NEGATIVE ng/mL (ref <5.0)
Codeine: NEGATIVE ng/mL (ref <2.5)
Cotinine: 199.9 ng/mL — ABNORMAL HIGH (ref <5.0)
Diazepam: NEGATIVE ng/mL (ref <0.50)
Dihydrocodeine: NEGATIVE ng/mL (ref <2.5)
Fentanyl: NEGATIVE ng/mL (ref <0.10)
Flunitrazepam: NEGATIVE ng/mL (ref <0.50)
Flurazepam: NEGATIVE ng/mL (ref <0.50)
Heroin Metabolite: NEGATIVE ng/mL (ref <1.0)
Hydrocodone: NEGATIVE ng/mL (ref <2.5)
Hydromorphone: NEGATIVE ng/mL (ref <2.5)
Lorazepam: NEGATIVE ng/mL (ref <0.50)
MARIJUANA: NEGATIVE ng/mL (ref <2.5)
MDMA: NEGATIVE ng/mL (ref <10)
Meprobamate: NEGATIVE ng/mL (ref <2.5)
Methadone: NEGATIVE ng/mL (ref <5.0)
Midazolam: NEGATIVE ng/mL (ref <0.50)
Morphine: NEGATIVE ng/mL (ref <2.5)
Nicotine Metabolite: POSITIVE ng/mL — AB (ref <5.0)
Nordiazepam: NEGATIVE ng/mL (ref <0.50)
Norhydrocodone: NEGATIVE ng/mL (ref <2.5)
Noroxycodone: 103.8 ng/mL — ABNORMAL HIGH (ref <2.5)
Opiates: POSITIVE ng/mL — AB (ref <2.5)
Oxazepam: NEGATIVE ng/mL (ref <0.50)
Oxycodone: >250 ng/mL — ABNORMAL HIGH (ref <2.5)
Oxymorphone: NEGATIVE ng/mL (ref <2.5)
Phencyclidine: NEGATIVE ng/mL (ref <10)
Tapentadol: NEGATIVE ng/mL (ref <5.0)
Temazepam: NEGATIVE ng/mL (ref <0.50)
Tramadol: NEGATIVE ng/mL (ref <5.0)
Triazolam: NEGATIVE ng/mL (ref <0.50)
Zolpidem: NEGATIVE ng/mL (ref <5.0)

## 2020-07-24 LAB — DRUG TOX ALC METAB W/CON, ORAL FLD: Alcohol Metabolite: NEGATIVE ng/mL (ref <25)

## 2020-07-27 ENCOUNTER — Telehealth: Payer: Self-pay | Admitting: *Deleted

## 2020-07-27 NOTE — Telephone Encounter (Signed)
Oral swab drug screen was consistent for prescribed medications.  ?

## 2020-08-12 ENCOUNTER — Telehealth: Payer: Self-pay | Admitting: Physical Medicine & Rehabilitation

## 2020-08-12 MED ORDER — OXYCODONE HCL ER 20 MG PO T12A: 20.0000 mg | EXTENDED_RELEASE_TABLET | Freq: Two times a day (BID) | ORAL | 0 refills | Status: DC

## 2020-08-12 MED ORDER — OXYCODONE-ACETAMINOPHEN 7.5-325 MG PO TABS: 1.0000 | ORAL_TABLET | ORAL | 0 refills | Status: DC | PRN

## 2020-08-12 NOTE — Telephone Encounter (Signed)
Patient had an appt Friday with [name], however she is sick and patient will be out of his Oxycodone and Oxycontin on 7/17.  Please let us know so we can get patient rescheduled.  Thank you.

## 2020-08-14 ENCOUNTER — Encounter: Payer: Medicare HMO | Admitting: Registered Nurse

## 2020-09-08 ENCOUNTER — Other Ambulatory Visit: Payer: Self-pay

## 2020-09-08 ENCOUNTER — Encounter: Payer: Medicare HMO | Attending: Physical Medicine and Rehabilitation | Admitting: Registered Nurse

## 2020-09-08 ENCOUNTER — Encounter: Payer: Self-pay | Admitting: Registered Nurse

## 2020-09-08 VITALS — BP 123/73 | HR 82 | Temp 98.6°F | Ht 67.0 in | Wt 123.0 lb

## 2020-09-08 DIAGNOSIS — G894 Chronic pain syndrome: Secondary | ICD-10-CM | POA: Diagnosis present

## 2020-09-08 DIAGNOSIS — R202 Paresthesia of skin: Secondary | ICD-10-CM | POA: Insufficient documentation

## 2020-09-08 DIAGNOSIS — Z79891 Long term (current) use of opiate analgesic: Secondary | ICD-10-CM | POA: Diagnosis present

## 2020-09-08 DIAGNOSIS — Z9889 Other specified postprocedural states: Secondary | ICD-10-CM | POA: Insufficient documentation

## 2020-09-08 DIAGNOSIS — G8929 Other chronic pain: Secondary | ICD-10-CM | POA: Diagnosis present

## 2020-09-08 DIAGNOSIS — M546 Pain in thoracic spine: Secondary | ICD-10-CM | POA: Insufficient documentation

## 2020-09-08 DIAGNOSIS — Z5181 Encounter for therapeutic drug level monitoring: Secondary | ICD-10-CM | POA: Insufficient documentation

## 2020-09-08 DIAGNOSIS — M961 Postlaminectomy syndrome, not elsewhere classified: Secondary | ICD-10-CM | POA: Diagnosis present

## 2020-09-08 MED ORDER — OXYCODONE-ACETAMINOPHEN 7.5-325 MG PO TABS: 1.0000 | ORAL_TABLET | ORAL | 0 refills | Status: DC | PRN

## 2020-09-08 MED ORDER — OXYCODONE HCL ER 20 MG PO T12A: 20.0000 mg | EXTENDED_RELEASE_TABLET | Freq: Two times a day (BID) | ORAL | 0 refills | Status: DC

## 2020-09-08 NOTE — Progress Notes (Signed)
Subjective:    Patient ID: Bruce Olson, male    DOB: Jun 11, 1955, 65 y.o.   MRN: [id number]  HPI: Bruce Olson is a 65 y.o. male who returns for follow up appointment for chronic pain and medication refill. He states his pain is located in his mid- lower back pain.He rates his pain 7. His current exercise regime is walking and performing stretching exercises.  Mr. Skiles Morphine equivalent is 101.50 MME.   Last Oral Swab was Performed on 07/17/2020, it was consistent.     Pain Inventory Average Pain 7 Pain Right Now 7 My pain is sharp, stabbing, and tingling  In the last 24 hours, has pain interfered with the following? General activity 7 Relation with others 7 Enjoyment of life 7 What TIME of day is your pain at its worst? morning , daytime, evening, and night Sleep (in general) Poor  Pain is worse with: walking, sitting, and standing Pain improves with: medication Relief from Meds: 3  Family History  Problem Relation Age of Onset   Anesthesia problems Neg Hx    Hypotension Neg Hx    Malignant hyperthermia Neg Hx    Pseudochol deficiency Neg Hx    Social History   Socioeconomic History   Marital status: Single    Spouse name: Not on file   Number of children: Not on file   Years of education: Not on file   Highest education level: Not on file  Occupational History   Not on file  Tobacco Use   Smoking status: Every Day    Packs/day: 1.00    Years: 33.00    Pack years: 33.00    Types: Cigarettes   Smokeless tobacco: Never  Vaping Use   Vaping Use: Never used  Substance and Sexual Activity   Alcohol use: No   Drug use: No   Sexual activity: Never    Comment: back problems  Other Topics Concern   Not on file  Social History Narrative   Not on file   Social Determinants of Health   Financial Resource Strain: Not on file  Food Insecurity: Not on file  Transportation Needs: Not on file  Physical Activity: Not on file  Stress: Not on file   Social Connections: Not on file   Past Surgical History:  Procedure Laterality Date   BACK SURGERY  2013   Back surgery  (fusion)09/15/2010 & 2013   BACK SURGERY  03-2013   HARDWARE REMOVAL N/A 03/14/2013   Procedure: HARDWARE REMOVAL;  Surgeon: Eustace Moore, MD;  Location: [facility];  Service: Neurosurgery;  Laterality: N/A;   Past Surgical History:  Procedure Laterality Date   BACK SURGERY  2013   Back surgery  (fusion)09/15/2010 & 2013   BACK SURGERY  03-2013   HARDWARE REMOVAL N/A 03/14/2013   Procedure: HARDWARE REMOVAL;  Surgeon: Eustace Moore, MD;  Location: [place] [facility];  Service: Neurosurgery;  Laterality: N/A;   Past Medical History:  Diagnosis Date   Goals of care, counseling/discussion 04/07/2016   Iron deficiency anemia due to chronic blood loss 10/21/2016   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   No pertinent past medical history    Occupational injury 2013   resulted in back injury that led to surgery   Thyroid cancer ([place])    BP 123/73 (BP Location: Right Arm)   Pulse 82   Temp 98.6 F (37 C)   Ht '5\' 7"'$  (1.702 m)   Wt 123 lb (55.8 kg)  SpO2 (!) 89%   BMI 19.26 kg/m   Opioid Risk Score:   Fall Risk Score:  `1  Depression screen PHQ 2/9  Depression screen [facility] 2/9 07/17/2020 04/21/2020 03/24/2020 01/21/2020 12/31/2019 11/14/2018 02/16/2018  Decreased Interest 0 0 0 0 0 0 0  Down, Depressed, Hopeless 0 0 0 0 0 0 0  PHQ - 2 Score 0 0 0 0 0 0 0  Altered sleeping - - - - - - -  Tired, decreased energy - - - - - - -  Change in appetite - - - - - - -  Feeling bad or failure about yourself  - - - - - - -  Trouble concentrating - - - - - - -  Moving slowly or fidgety/restless - - - - - - -  Suicidal thoughts - - - - - - -  PHQ-9 Score - - - - - - -  Some recent data might be hidden     Review of Systems  Constitutional: Negative.   HENT: Negative.    Eyes: Negative.   Respiratory: Negative.    Cardiovascular: Negative.   Gastrointestinal: Negative.   Endocrine:  Negative.   Genitourinary: Negative.   Musculoskeletal:  Positive for back pain and gait problem.  Skin: Negative.   Allergic/Immunologic: Negative.       Objective:   Physical Exam Vitals and nursing note reviewed.  Constitutional:      Appearance: Normal appearance.  Cardiovascular:     Rate and Rhythm: Normal rate and regular rhythm.     Pulses: Normal pulses.     Heart sounds: Normal heart sounds.  Pulmonary:     Effort: Pulmonary effort is normal.     Breath sounds: Normal breath sounds.  Musculoskeletal:     Cervical back: Normal range of motion and neck supple.     Right lower leg: Edema present.     Left lower leg: Edema present.     Comments: Normal Muscle Bulk and Muscle Testing Reveals:  Upper Extremities: Full ROM and Muscle Strength 5/5 Lumbar Hypersensitivity Lower Extremities : Full ROM and Muscle Strength 5/5 Arises from Table with ease Narrow Based Gait     Skin:    General: Skin is warm and dry.  Neurological:     Mental Status: He is alert and oriented to person, place, and time.  Psychiatric:        Mood and Affect: Mood normal.        Behavior: Behavior normal.         Assessment & Plan:  1. Lumbar postlaminectomy syndrome status post lumbar fusion x3: His most recent surgery was 03/14/2013. He had hardware removal and bone allograft waist and L2-3.  Continue to Monitor. Continue current Medication Regimen.09/08/2020 Refilled: oxyCODONE 7.5/'325mg'$  one tablet every 4 hours as needed #100 and :Oxycontin 20 mg one tablet every 12 hours #60. We will continue the opioid monitoring program, this consists of regular clinic visits, examinations, urine drug screen, pill counts as well as use of [place] Controlled Substance Reporting system. A 12 month History has been reviewed on the [place] on 09/08/2020.  2. Lumbar Radiculopathy: Continue current medication regimen with Lyrica. 09/08/2020 3. Insomnia:  Continue current medication regimen with Trazodone.09/08/2020 4. Nausea: Continue Zofran as needed.Oncology Following.  09/08/2020 5. Cervical Spondylosis/ Cervicalgia/ Cervical Radiculitis:   Continue current medication regimen with  Lyrica. 09/08/2020 6. Cervical Myofascial Pain Syndrome: Continue current treatment regimen. Continue to monitor.  09/08/2020. 7. Paresthesia: Continue Lyrica: Continue to Monitor.  09/08/2020.   F/U in 1 month

## 2020-09-10 ENCOUNTER — Other Ambulatory Visit: Payer: Self-pay | Admitting: Hematology & Oncology

## 2020-09-11 ENCOUNTER — Ambulatory Visit: Payer: Medicare HMO | Admitting: Registered Nurse

## 2020-09-15 ENCOUNTER — Other Ambulatory Visit: Payer: Self-pay | Admitting: Hematology & Oncology

## 2020-09-28 ENCOUNTER — Other Ambulatory Visit: Payer: Self-pay | Admitting: Family

## 2020-10-14 ENCOUNTER — Other Ambulatory Visit: Payer: Self-pay

## 2020-10-14 ENCOUNTER — Encounter: Payer: Self-pay | Admitting: Registered Nurse

## 2020-10-14 ENCOUNTER — Encounter: Payer: Medicare HMO | Attending: Physical Medicine and Rehabilitation | Admitting: Registered Nurse

## 2020-10-14 VITALS — BP 137/69 | HR 80 | Temp 98.0°F | Ht 67.0 in | Wt 122.4 lb

## 2020-10-14 DIAGNOSIS — G894 Chronic pain syndrome: Secondary | ICD-10-CM | POA: Insufficient documentation

## 2020-10-14 DIAGNOSIS — Z5181 Encounter for therapeutic drug level monitoring: Secondary | ICD-10-CM | POA: Diagnosis present

## 2020-10-14 DIAGNOSIS — Z79891 Long term (current) use of opiate analgesic: Secondary | ICD-10-CM | POA: Insufficient documentation

## 2020-10-14 DIAGNOSIS — R202 Paresthesia of skin: Secondary | ICD-10-CM | POA: Insufficient documentation

## 2020-10-14 DIAGNOSIS — Z9889 Other specified postprocedural states: Secondary | ICD-10-CM | POA: Insufficient documentation

## 2020-10-14 DIAGNOSIS — M961 Postlaminectomy syndrome, not elsewhere classified: Secondary | ICD-10-CM | POA: Insufficient documentation

## 2020-10-14 MED ORDER — OXYCODONE HCL ER 20 MG PO T12A: 20.0000 mg | EXTENDED_RELEASE_TABLET | Freq: Two times a day (BID) | ORAL | 0 refills | Status: DC

## 2020-10-14 MED ORDER — OXYCODONE-ACETAMINOPHEN 7.5-325 MG PO TABS: 1.0000 | ORAL_TABLET | ORAL | 0 refills | Status: DC | PRN

## 2020-10-14 MED ORDER — PREGABALIN 100 MG PO CAPS: 100.0000 mg | ORAL_CAPSULE | Freq: Three times a day (TID) | ORAL | 5 refills | Status: DC

## 2020-10-14 NOTE — Progress Notes (Signed)
Subjective:    Patient ID: Bruce Olson, male    DOB: August 25, 1955, 65 y.o.   MRN: [id number]  HPI: Bruce Olson is a 65 y.o. male who returns for follow up appointment for chronic pain and medication refill. He states his pain is located in his lower back. He rates his pain 7. His current exercise regime is walking and performing stretching exercises.  Mr. Reaney Morphine equivalent is 101.50  MME.   Oral Swab was Performed on 07/17/2020, it was consistent.     Pain Inventory Average Pain 7 Pain Right Now 7 My pain is constant  In the last 24 hours, has pain interfered with the following? General activity 7 Relation with others 7 Enjoyment of life 7 What TIME of day is your pain at its worst? morning , daytime, evening, and night Sleep (in general) Poor  Pain is worse with: walking, sitting, and standing Pain improves with: heat/ice, medication, and TENS Relief from Meds: 3  Family History  Problem Relation Age of Onset   Anesthesia problems Neg Hx    Hypotension Neg Hx    Malignant hyperthermia Neg Hx    Pseudochol deficiency Neg Hx    Social History   Socioeconomic History   Marital status: Single    Spouse name: Not on file   Number of children: Not on file   Years of education: Not on file   Highest education level: Not on file  Occupational History   Not on file  Tobacco Use   Smoking status: Every Day    Packs/day: 1.00    Years: 33.00    Pack years: 33.00    Types: Cigarettes   Smokeless tobacco: Never  Vaping Use   Vaping Use: Never used  Substance and Sexual Activity   Alcohol use: No   Drug use: No   Sexual activity: Never    Comment: back problems  Other Topics Concern   Not on file  Social History Narrative   Not on file   Social Determinants of Health   Financial Resource Strain: Not on file  Food Insecurity: Not on file  Transportation Needs: Not on file  Physical Activity: Not on file  Stress: Not on file  Social  Connections: Not on file   Past Surgical History:  Procedure Laterality Date   BACK SURGERY  2013   Back surgery  (fusion)09/15/2010 & 2013   BACK SURGERY  03-2013   HARDWARE REMOVAL N/A 03/14/2013   Procedure: HARDWARE REMOVAL;  Surgeon: Eustace Moore, MD;  Location: [facility];  Service: Neurosurgery;  Laterality: N/A;   Past Surgical History:  Procedure Laterality Date   BACK SURGERY  2013   Back surgery  (fusion)09/15/2010 & 2013   BACK SURGERY  03-2013   HARDWARE REMOVAL N/A 03/14/2013   Procedure: HARDWARE REMOVAL;  Surgeon: Eustace Moore, MD;  Location: [place] [facility];  Service: Neurosurgery;  Laterality: N/A;   Past Medical History:  Diagnosis Date   Goals of care, counseling/discussion 04/07/2016   Iron deficiency anemia due to chronic blood loss 10/21/2016   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 2/9 10/14/2020 09/08/2020 07/17/2020 04/21/2020 03/24/2020 01/21/2020 12/31/2019  Decreased Interest 0 0 0 0 0 0 0  Down, Depressed, Hopeless 0 0 0 0 0 0 0  PHQ - 2 Score 0 0 0 0 0 0 0  Altered sleeping - - - - - - -  Tired, decreased energy - - - - - - -  Change in appetite - - - - - - -  Feeling bad or failure about yourself  - - - - - - -  Trouble concentrating - - - - - - -  Moving slowly or fidgety/restless - - - - - - -  Suicidal thoughts - - - - - - -  PHQ-9 Score - - - - - - -  Some recent data might be hidden     Review of Systems  Constitutional: Negative.   HENT: Negative.    Eyes: Negative.   Respiratory: Negative.    Cardiovascular: Negative.   Gastrointestinal: Negative.   Endocrine: Negative.   Genitourinary:  Negative.   Musculoskeletal:  Positive for back pain.  Skin: Negative.   Allergic/Immunologic: Negative.   Neurological: Negative.   Hematological: Negative.   Psychiatric/Behavioral: Negative.    All other systems reviewed and are negative.     Objective:   Physical Exam Vitals and nursing note reviewed.  Constitutional:      Appearance: Normal appearance.  Cardiovascular:     Rate and Rhythm: Normal rate and regular rhythm.     Pulses: Normal pulses.     Heart sounds: Normal heart sounds.  Pulmonary:     Effort: Pulmonary effort is normal.     Breath sounds: Normal breath sounds.  Musculoskeletal:     Cervical back: Normal range of motion and neck supple.     Comments: Normal Muscle Bulk and Muscle Testing Reveals:  Upper Extremities: Full ROM and Muscle Strength  5/5 Lumbar Hypersensitivity Lower Extremities  Full ROM and Muscle Strength 5/5 Arises from Table with Ease Narrow Based Gait     Skin:    General: Skin is warm and dry.  Neurological:     Mental Status: He is alert and oriented to person, place, and time.  Psychiatric:        Mood and Affect: Mood normal.        Behavior: Behavior normal.         Assessment & Plan:  1. Lumbar postlaminectomy syndrome status post lumbar fusion x3: His most recent surgery was 03/14/2013. He had hardware removal and bone allograft waist and L2-3.  Continue to Monitor. Continue current Medication Regimen.10/14/2020 Refilled: oxyCODONE 7.5/'325mg'$  one tablet every 4 hours as needed #100 and :Oxycontin 20 mg one tablet every 12 hours #60. We will continue the opioid monitoring program, this consists of regular clinic visits, examinations, urine drug screen, pill counts as well as use of [place] Controlled Substance Reporting system. A 12 month History has been reviewed on the [place] on 10/14/2020.  2. Lumbar Radiculopathy: Continue current medication regimen with Lyrica.  10/14/2020 3. Insomnia: Continue current medication regimen with Trazodone.10/14/2020 4. Nausea: Continue Zofran as needed.Oncology Following.  10/14/2020 5. Cervical Spondylosis/ Cervicalgia/ Cervical Radiculitis:   Continue current medication regimen with  Lyrica. 10/14/2020 6. Cervical Myofascial Pain Syndrome: Continue current treatment regimen. Continue to monitor. 10/14/2020. 7. Paresthesia: Continue Lyrica:  Continue to Monitor.  10/14/2020.   F/U in 1 month

## 2020-10-16 ENCOUNTER — Other Ambulatory Visit: Payer: Medicare Other

## 2020-10-16 ENCOUNTER — Ambulatory Visit: Payer: Medicare Other | Admitting: Hematology & Oncology

## 2020-11-10 ENCOUNTER — Encounter: Payer: Medicare HMO | Admitting: Registered Nurse

## 2020-11-10 NOTE — Progress Notes (Deleted)
Subjective:    Patient ID: Bruce Olson, male    DOB: 1955/07/30, 65 y.o.   MRN: [id number]  HPI  Pain Inventory Average Pain 7 Pain Right Now 7 My pain is constant  In the last 24 hours, has pain interfered with the following? General activity 7 Relation with others 7 Enjoyment of life 7 What TIME of day is your pain at its worst? morning , daytime, evening, and night Sleep (in general) Poor  Pain is worse with: walking, sitting, and standing Pain improves with: heat/ice, medication, and TENS Relief from Meds: 3  Family History  Problem Relation Age of Onset  . Anesthesia problems Neg Hx   . Hypotension Neg Hx   . Malignant hyperthermia Neg Hx   . Pseudochol deficiency Neg Hx    Social History   Socioeconomic History  . Marital status: Single    Spouse name: Not on file  . Number of children: Not on file  . Years of education: Not on file  . Highest education level: Not on file  Occupational History  . Not on file  Tobacco Use  . Smoking status: Every Day    Packs/day: 1.00    Years: 33.00    Pack years: 33.00    Types: Cigarettes  . Smokeless tobacco: Never  Vaping Use  . Vaping Use: Never used  Substance and Sexual Activity  . Alcohol use: No  . Drug use: No  . Sexual activity: Never    Comment: back problems  Other Topics Concern  . Not on file  Social History Narrative  . Not on file   Social Determinants of Health   Financial Resource Strain: Not on file  Food Insecurity: Not on file  Transportation Needs: Not on file  Physical Activity: Not on file  Stress: Not on file  Social Connections: Not on file   Past Surgical History:  Procedure Laterality Date  . BACK SURGERY  2013   Back surgery  (fusion)09/15/2010 & 2013  . BACK SURGERY  03-2013  . HARDWARE REMOVAL N/A 03/14/2013   Procedure: HARDWARE REMOVAL;  Surgeon: Eustace Moore, MD;  Location: [place] [facility];  Service: Neurosurgery;  Laterality: N/A;   Past Surgical History:   Procedure Laterality Date  . BACK SURGERY  2013   Back surgery  (fusion)09/15/2010 & 2013  . BACK SURGERY  03-2013  . HARDWARE REMOVAL N/A 03/14/2013   Procedure: HARDWARE REMOVAL;  Surgeon: Eustace Moore, MD;  Location: [place] [facility];  Service: Neurosurgery;  Laterality: N/A;   Past Medical History:  Diagnosis Date  . Goals of care, counseling/discussion 04/07/2016  . Iron deficiency anemia due to chronic blood loss 10/21/2016  . 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 No pertinent past medical history   . Occupational injury 2013   resulted in back injury that led to surgery  . Thyroid cancer ([place])    There were no vitals taken for this visit.  Opioid Risk Score:   Fall Risk Score:  `1  Depression screen PHQ 2/9  Depression screen [facility] 2/9 10/14/2020 09/08/2020 07/17/2020 04/21/2020 03/24/2020 01/21/2020 12/31/2019  Decreased Interest 0 0 0 0 0 0 0  Down, Depressed, Hopeless 0 0 0 0 0 0 0  PHQ - 2 Score 0 0 0 0 0 0 0  Altered sleeping - - - - - - -  Tired, decreased energy - - - - - - -  Change in appetite - - - - - - -  Feeling bad or failure about yourself  - - - - - - -  Trouble concentrating - - - - - - -  Moving slowly or fidgety/restless - - - - - - -  Suicidal thoughts - - - - - - -  PHQ-9 Score - - - - - - -  Some recent data might be hidden     Review of Systems  Musculoskeletal:  Positive for back pain.  All other systems reviewed and are negative.     Objective:   Physical Exam        Assessment & Plan:

## 2020-11-11 ENCOUNTER — Encounter: Payer: Medicare HMO | Attending: Physical Medicine and Rehabilitation | Admitting: Registered Nurse

## 2020-11-11 ENCOUNTER — Other Ambulatory Visit: Payer: Self-pay

## 2020-11-11 VITALS — BP 132/74 | HR 87 | Temp 98.1°F | Ht 67.0 in | Wt 126.0 lb

## 2020-11-11 DIAGNOSIS — M546 Pain in thoracic spine: Secondary | ICD-10-CM | POA: Diagnosis present

## 2020-11-11 DIAGNOSIS — M961 Postlaminectomy syndrome, not elsewhere classified: Secondary | ICD-10-CM | POA: Diagnosis not present

## 2020-11-11 DIAGNOSIS — G8929 Other chronic pain: Secondary | ICD-10-CM | POA: Diagnosis present

## 2020-11-11 DIAGNOSIS — Z5181 Encounter for therapeutic drug level monitoring: Secondary | ICD-10-CM | POA: Diagnosis present

## 2020-11-11 DIAGNOSIS — Z9889 Other specified postprocedural states: Secondary | ICD-10-CM | POA: Diagnosis present

## 2020-11-11 DIAGNOSIS — G894 Chronic pain syndrome: Secondary | ICD-10-CM | POA: Insufficient documentation

## 2020-11-11 DIAGNOSIS — Z79891 Long term (current) use of opiate analgesic: Secondary | ICD-10-CM | POA: Insufficient documentation

## 2020-11-11 DIAGNOSIS — R202 Paresthesia of skin: Secondary | ICD-10-CM | POA: Diagnosis present

## 2020-11-11 MED ORDER — OXYCODONE HCL ER 20 MG PO T12A: 20.0000 mg | EXTENDED_RELEASE_TABLET | Freq: Two times a day (BID) | ORAL | 0 refills | Status: DC

## 2020-11-11 MED ORDER — OXYCODONE-ACETAMINOPHEN 7.5-325 MG PO TABS: 1.0000 | ORAL_TABLET | ORAL | 0 refills | Status: DC | PRN

## 2020-11-11 NOTE — Progress Notes (Signed)
Subjective:    Patient ID: Bruce Olson, male    DOB: May 17, 1955, 65 y.o.   MRN: [id number]  HPI: Bruce Olson is a 65 y.o. male who returns for follow up appointment for chronic pain and medication refill. He states his pain is located in his mid- lower back . He rates his pain 7. His current exercise regime is walking and performing stretching exercises.  Bruce Olson Morphine equivalent is 105.00  MME.   Last Oral Swab was Performed on 07/17/2020, it was consistent.     Pain Inventory Average Pain 7 Pain Right Now 7 My pain is sharp, stabbing, and tingling  In the last 24 hours, has pain interfered with the following? General activity 7 Relation with others 7 Enjoyment of life 7 What TIME of day is your pain at its worst? morning , daytime, evening, and night Sleep (in general) Poor  Pain is worse with: walking, sitting, and standing Pain improves with: rest and heat/ice Relief from Meds: 3  Family History  Problem Relation Age of Onset   Anesthesia problems Neg Hx    Hypotension Neg Hx    Malignant hyperthermia Neg Hx    Pseudochol deficiency Neg Hx    Social History   Socioeconomic History   Marital status: Single    Spouse name: Not on file   Number of children: Not on file   Years of education: Not on file   Highest education level: Not on file  Occupational History   Not on file  Tobacco Use   Smoking status: Every Day    Packs/day: 1.00    Years: 33.00    Pack years: 33.00    Types: Cigarettes   Smokeless tobacco: Never  Vaping Use   Vaping Use: Never used  Substance and Sexual Activity   Alcohol use: No   Drug use: No   Sexual activity: Never    Comment: back problems  Other Topics Concern   Not on file  Social History Narrative   Not on file   Social Determinants of Health   Financial Resource Strain: Not on file  Food Insecurity: Not on file  Transportation Needs: Not on file  Physical Activity: Not on file  Stress: Not on  file  Social Connections: Not on file   Past Surgical History:  Procedure Laterality Date   BACK SURGERY  2013   Back surgery  (fusion)09/15/2010 & 2013   BACK SURGERY  03-2013   HARDWARE REMOVAL N/A 03/14/2013   Procedure: HARDWARE REMOVAL;  Surgeon: Bruce Moore, MD;  Location: [facility];  Service: Neurosurgery;  Laterality: N/A;   Past Surgical History:  Procedure Laterality Date   BACK SURGERY  2013   Back surgery  (fusion)09/15/2010 & 2013   BACK SURGERY  03-2013   HARDWARE REMOVAL N/A 03/14/2013   Procedure: HARDWARE REMOVAL;  Surgeon: Bruce Moore, MD;  Location: [place] [facility];  Service: Neurosurgery;  Laterality: N/A;   Past Medical History:  Diagnosis Date   Goals of care, counseling/discussion 04/07/2016   Iron deficiency anemia due to chronic blood loss 10/21/2016   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 2/9 11/11/2020 10/14/2020 09/08/2020 07/17/2020 04/21/2020 03/24/2020 01/21/2020  Decreased Interest 0 0 0 0 0 0 0  Down, Depressed, Hopeless 0 0 0 0 0 0 0  PHQ - 2 Score 0 0 0 0 0 0 0  Altered sleeping - - - - - - -  Tired, decreased energy - - - - - - -  Change in appetite - - - - - - -  Feeling bad or failure about yourself  - - - - - - -  Trouble concentrating - - - - - - -  Moving slowly or fidgety/restless - - - - - - -  Suicidal thoughts - - - - - - -  PHQ-9 Score - - - - - - -  Some recent data might be hidden     Review of Systems  Constitutional: Negative.   HENT: Negative.    Eyes: Negative.   Respiratory: Negative.    Endocrine: Negative.   Genitourinary: Negative.   Allergic/Immunologic: Negative.   Neurological: Negative.       Objective:    Physical Exam Vitals and nursing note reviewed.  Constitutional:      Appearance: Normal appearance.  Cardiovascular:     Rate and Rhythm: Normal rate and regular rhythm.     Pulses: Normal pulses.     Heart sounds: Normal heart sounds.  Pulmonary:     Effort: Pulmonary effort is normal.     Breath sounds: Normal breath sounds.  Musculoskeletal:     Cervical back: Normal range of motion and neck supple.     Comments: Normal Muscle Bulk and Muscle Testing Reveals:  Upper Extremities: Full ROM and Muscle Strength 5/5  Thoracic and Lumbar Hypersensitivity Lower Extremities: Full ROM and Muscle Strength 5/5 Arises from Table Slowly Antalgic Gait     Skin:    General: Skin is warm and dry.  Neurological:     Mental Status: He is alert and oriented to person, place, and time.  Psychiatric:        Mood and Affect: Mood normal.        Behavior: Behavior normal.         Assessment & Plan:  1. Lumbar postlaminectomy syndrome status post lumbar fusion x3: His most recent surgery was 03/14/2013. He had hardware removal and bone allograft waist and L2-3.  Continue to Monitor. Continue current Medication Regimen.11/13/2020 Refilled: oxyCODONE 7.5/325mg  one tablet every 4 hours as needed #100 and :Oxycontin 20 mg one tablet every 12 hours #60. We will continue the opioid monitoring program, this consists of regular clinic visits, examinations, urine drug screen, pill counts as well as use of [place] Controlled Substance Reporting system. A 12 month History has been reviewed on the [place] on 11/13/2020.  2. Lumbar Radiculopathy: Continue current medication regimen with Lyrica. 11/13/2020 3. Insomnia: Continue current medication regimen with Trazodone.11/13/2020 4. Nausea: Continue Zofran as needed.Oncology Following.  11/13/2020 5. Cervical Spondylosis/ Cervicalgia/ Cervical Radiculitis:   Continue current medication regimen with  Lyrica.  11/13/2020 6. Cervical Myofascial Pain Syndrome: Continue current treatment regimen. Continue to monitor. 11/13/2020. 7. Paresthesia: Continue Lyrica: Continue to Monitor.  11/13/2020.   F/U in 1 month

## 2020-11-13 ENCOUNTER — Encounter: Payer: Self-pay | Admitting: Registered Nurse

## 2020-12-11 ENCOUNTER — Encounter: Payer: Medicare HMO | Attending: Physical Medicine and Rehabilitation | Admitting: Registered Nurse

## 2020-12-11 ENCOUNTER — Encounter: Payer: Self-pay | Admitting: Registered Nurse

## 2020-12-11 ENCOUNTER — Other Ambulatory Visit: Payer: Self-pay

## 2020-12-11 VITALS — BP 135/73 | HR 73 | Temp 97.9°F | Ht 67.0 in | Wt 125.2 lb

## 2020-12-11 DIAGNOSIS — M961 Postlaminectomy syndrome, not elsewhere classified: Secondary | ICD-10-CM | POA: Insufficient documentation

## 2020-12-11 DIAGNOSIS — G8929 Other chronic pain: Secondary | ICD-10-CM | POA: Diagnosis present

## 2020-12-11 DIAGNOSIS — R0902 Hypoxemia: Secondary | ICD-10-CM | POA: Insufficient documentation

## 2020-12-11 DIAGNOSIS — Z9889 Other specified postprocedural states: Secondary | ICD-10-CM | POA: Diagnosis not present

## 2020-12-11 DIAGNOSIS — Z5181 Encounter for therapeutic drug level monitoring: Secondary | ICD-10-CM | POA: Insufficient documentation

## 2020-12-11 DIAGNOSIS — G894 Chronic pain syndrome: Secondary | ICD-10-CM | POA: Insufficient documentation

## 2020-12-11 DIAGNOSIS — R202 Paresthesia of skin: Secondary | ICD-10-CM | POA: Diagnosis not present

## 2020-12-11 DIAGNOSIS — M546 Pain in thoracic spine: Secondary | ICD-10-CM | POA: Insufficient documentation

## 2020-12-11 DIAGNOSIS — Z79891 Long term (current) use of opiate analgesic: Secondary | ICD-10-CM | POA: Insufficient documentation

## 2020-12-11 MED ORDER — OXYCODONE HCL ER 20 MG PO T12A: 20.0000 mg | EXTENDED_RELEASE_TABLET | Freq: Two times a day (BID) | ORAL | 0 refills | Status: DC

## 2020-12-11 MED ORDER — OXYCODONE-ACETAMINOPHEN 7.5-325 MG PO TABS: 1.0000 | ORAL_TABLET | ORAL | 0 refills | Status: DC | PRN

## 2020-12-11 NOTE — Progress Notes (Signed)
Subjective:    Patient ID: Bruce Olson, male    DOB: 19-Oct-1955, 65 y.o.   MRN: [id number]  HPI: [name] is a 65 y.o. male who returns for follow up appointment for chronic pain and medication refill. He states his pain is located in his mid- lower back and right lower extremity numbness. He rates his pain 7. His current exercise regime is walking and performing stretching exercises.  [name] arrived to office with oxygen desaturation, oxygen saturation re-checked 90%. He states he had been experiencing SOB at times, he refuses to go to ED for evaluation. He states he will call his PCP today, he was instructed to go to the emergency room if he experiences any SOB. He denies SOB at this time. We will continue to monitor.  [name] Morphine equivalent is 101.50  MME.   Oral Swab was Performed today.     Pain Inventory Average Pain 7 Pain Right Now 7 My pain is constant, sharp, stabbing, and tingling  In the last 24 hours, has pain interfered with the following? General activity 7 Relation with others 7 Enjoyment of life 7 What TIME of day is your pain at its worst? morning , daytime, evening, and night Sleep (in general) Poor  Pain is worse with: walking, sitting, and standing Pain improves with: rest, heat/ice, medication, and TENS Relief from Meds: 3  Family History  Problem Relation Age of Onset   Anesthesia problems Neg Hx    Hypotension Neg Hx    Malignant hyperthermia Neg Hx    Pseudochol deficiency Neg Hx    Social History   Socioeconomic History   Marital status: Single    Spouse name: Not on file   Number of children: Not on file   Years of education: Not on file   Highest education level: Not on file  Occupational History   Not on file  Tobacco Use   Smoking status: Every Day    Packs/day: 1.00    Years: 33.00    Pack years: 33.00    Types: Cigarettes   Smokeless tobacco: Never  Vaping Use   Vaping Use: Never used   Substance and Sexual Activity   Alcohol use: No   Drug use: No   Sexual activity: Never    Comment: back problems  Other Topics Concern   Not on file  Social History Narrative   Not on file   Social Determinants of Health   Financial Resource Strain: Not on file  Food Insecurity: Not on file  Transportation Needs: Not on file  Physical Activity: Not on file  Stress: Not on file  Social Connections: Not on file   Past Surgical History:  Procedure Laterality Date   BACK SURGERY  2013   Back surgery  (fusion)09/15/2010 & 2013   BACK SURGERY  03-2013   HARDWARE REMOVAL N/A 03/14/2013   Procedure: HARDWARE REMOVAL;  Surgeon: Eustace Moore, MD;  Location: [facility];  Service: Neurosurgery;  Laterality: N/A;   Past Surgical History:  Procedure Laterality Date   BACK SURGERY  2013   Back surgery  (fusion)09/15/2010 & 2013   BACK SURGERY  03-2013   HARDWARE REMOVAL N/A 03/14/2013   Procedure: HARDWARE REMOVAL;  Surgeon: Eustace Moore, MD;  Location: [place] [facility];  Service: Neurosurgery;  Laterality: N/A;   Past Medical History:  Diagnosis Date   Goals of care, counseling/discussion 04/07/2016   Iron deficiency anemia due to chronic blood loss 10/21/2016  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   No pertinent past medical history    Occupational injury 2013   resulted in back injury that led to surgery   Thyroid cancer ([place])    Ht 5\' 7"  (1.702 m)   Wt 125 lb 3.2 oz (56.8 kg)   BMI 19.61 kg/m   Opioid Risk Score:   Fall Risk Score:  `1  Depression screen PHQ 2/9  Depression screen [facility] 2/9 12/11/2020 11/11/2020 10/14/2020 09/08/2020 07/17/2020 04/21/2020 03/24/2020  Decreased Interest 0 0 0 0 0 0 0  Down, Depressed, Hopeless 0 0 0 0 0 0 0  PHQ - 2 Score 0 0 0 0 0 0 0  Altered sleeping - - - - - - -  Tired, decreased energy - - - - - - -  Change in appetite - - - - - - -  Feeling bad or failure about yourself  - - - - - - -  Trouble concentrating - - - - - - -  Moving slowly or  fidgety/restless - - - - - - -  Suicidal thoughts - - - - - - -  PHQ-9 Score - - - - - - -  Some recent data might be hidden     Review of Systems  Constitutional: Negative.   HENT: Negative.    Eyes: Negative.   Respiratory: Negative.    Cardiovascular: Negative.   Endocrine: Negative.   Genitourinary: Negative.   Musculoskeletal:  Positive for back pain.  Skin: Negative.   Allergic/Immunologic: Negative.   Neurological:  Positive for numbness.  Hematological: Negative.   Psychiatric/Behavioral:  Positive for sleep disturbance.       Objective:   Physical Exam Vitals and nursing note reviewed.  Constitutional:      Appearance: Normal appearance.  Cardiovascular:     Rate and Rhythm: Normal rate and regular rhythm.     Pulses: Normal pulses.     Heart sounds: Normal heart sounds.  Pulmonary:     Effort: Pulmonary effort is normal.     Breath sounds: Rhonchi present.  Musculoskeletal:     Cervical back: Normal range of motion and neck supple.     Comments: Normal Muscle Bulk and Muscle Testing Reveals:  Upper Extremities: Full ROM and Muscle Strength 5/5  Thoracic and Lumbar Hypersensitivity Lower Extremities: Full ROM and Muscle Strength 5/5 Arises from Table Slowly Narrow Based  Gait     Skin:    General: Skin is warm and dry.  Neurological:     Mental Status: He is alert and oriented to person, place, and time.  Psychiatric:        Mood and Affect: Mood normal.        Behavior: Behavior normal.         Assessment & Plan:  1. Lumbar postlaminectomy syndrome status post lumbar fusion x3: His most recent surgery was 03/14/2013. He had hardware removal and bone allograft waist and L2-3.  Continue to Monitor. Continue current Medication Regimen.12/11/2020 Refilled: oxyCODONE 7.5/325mg  one tablet every 4 hours as needed #100 and :Oxycontin 20 mg one tablet every 12 hours #60. We will continue the opioid monitoring program, this consists of regular clinic visits,  examinations, urine drug screen, pill counts as well as use of [place] Controlled Substance Reporting system. A 12 month History has been reviewed on the [place] on 12/11/2020.  2. Lumbar Radiculopathy: Continue current medication regimen with Lyrica. 12/11/2020 3. Insomnia: Continue current medication regimen  with Trazodone.12/11/2020 4. Nausea: Continue Zofran as needed.Oncology Following.  12/11/2020 5. Cervical Spondylosis/ Cervicalgia/ Cervical Radiculitis:   Continue current medication regimen with  Lyrica. 12/11/2020 6. Cervical Myofascial Pain Syndrome: Continue current treatment regimen. Continue to monitor. 12/11/2020. 7. Paresthesia: Continue Lyrica: Continue to Monitor.  12/11/2020.   F/U in 1 month

## 2020-12-14 ENCOUNTER — Inpatient Hospital Stay: Payer: Medicare HMO

## 2020-12-14 ENCOUNTER — Encounter: Payer: Self-pay | Admitting: Physician Assistant

## 2020-12-14 ENCOUNTER — Other Ambulatory Visit: Payer: Self-pay

## 2020-12-14 ENCOUNTER — Inpatient Hospital Stay: Payer: Medicare HMO | Attending: Hematology & Oncology

## 2020-12-14 ENCOUNTER — Telehealth: Payer: Self-pay | Admitting: Hematology & Oncology

## 2020-12-14 ENCOUNTER — Inpatient Hospital Stay (HOSPITAL_BASED_OUTPATIENT_CLINIC_OR_DEPARTMENT_OTHER): Payer: Medicare HMO | Admitting: Physician Assistant

## 2020-12-14 ENCOUNTER — Inpatient Hospital Stay: Payer: Medicare HMO | Admitting: Hematology & Oncology

## 2020-12-14 VITALS — BP 169/78 | HR 81 | Temp 97.6°F | Resp 18 | Wt 125.0 lb

## 2020-12-14 DIAGNOSIS — E611 Iron deficiency: Secondary | ICD-10-CM | POA: Diagnosis not present

## 2020-12-14 DIAGNOSIS — E039 Hypothyroidism, unspecified: Secondary | ICD-10-CM | POA: Insufficient documentation

## 2020-12-14 DIAGNOSIS — E034 Atrophy of thyroid (acquired): Secondary | ICD-10-CM

## 2020-12-14 DIAGNOSIS — C090 Malignant neoplasm of tonsillar fossa: Secondary | ICD-10-CM | POA: Diagnosis not present

## 2020-12-14 DIAGNOSIS — Z85818 Personal history of malignant neoplasm of other sites of lip, oral cavity, and pharynx: Secondary | ICD-10-CM | POA: Insufficient documentation

## 2020-12-14 DIAGNOSIS — Z9221 Personal history of antineoplastic chemotherapy: Secondary | ICD-10-CM | POA: Insufficient documentation

## 2020-12-14 DIAGNOSIS — Z79899 Other long term (current) drug therapy: Secondary | ICD-10-CM | POA: Insufficient documentation

## 2020-12-14 DIAGNOSIS — Z923 Personal history of irradiation: Secondary | ICD-10-CM | POA: Insufficient documentation

## 2020-12-14 LAB — CBC WITH DIFFERENTIAL (CANCER CENTER ONLY)
Abs Immature Granulocytes: 0.02 10*3/uL (ref 0.00–0.07)
Basophils Absolute: 0 10*3/uL (ref 0.0–0.1)
Basophils Relative: 1 %
Eosinophils Absolute: 0 10*3/uL (ref 0.0–0.5)
Eosinophils Relative: 1 %
HCT: 42.1 % (ref 39.0–52.0)
Hemoglobin: 14.1 g/dL (ref 13.0–17.0)
Immature Granulocytes: 0 %
Lymphocytes Relative: 17 %
Lymphs Abs: 1.2 10*3/uL (ref 0.7–4.0)
MCH: 30.8 pg (ref 26.0–34.0)
MCHC: 33.5 g/dL (ref 30.0–36.0)
MCV: 91.9 fL (ref 80.0–100.0)
Monocytes Absolute: 0.7 10*3/uL (ref 0.1–1.0)
Monocytes Relative: 10 %
Neutro Abs: 5.3 10*3/uL (ref 1.7–7.7)
Neutrophils Relative %: 71 %
Platelet Count: 201 10*3/uL (ref 150–400)
RBC: 4.58 MIL/uL (ref 4.22–5.81)
RDW: 12.1 % (ref 11.5–15.5)
WBC Count: 7.3 10*3/uL (ref 4.0–10.5)
nRBC: 0 % (ref 0.0–0.2)

## 2020-12-14 LAB — CMP (CANCER CENTER ONLY)
ALT: 13 U/L (ref 0–44)
AST: 16 U/L (ref 15–41)
Albumin: 4.3 g/dL (ref 3.5–5.0)
Alkaline Phosphatase: 64 U/L (ref 38–126)
Anion gap: 7 (ref 5–15)
BUN: 15 mg/dL (ref 8–23)
CO2: 29 mmol/L (ref 22–32)
Calcium: 9.6 mg/dL (ref 8.9–10.3)
Chloride: 99 mmol/L (ref 98–111)
Creatinine: 0.96 mg/dL (ref 0.61–1.24)
GFR, Estimated: >60 mL/min (ref >60)
Glucose, Bld: 98 mg/dL (ref 70–99)
Potassium: 4.2 mmol/L (ref 3.5–5.1)
Sodium: 135 mmol/L (ref 135–145)
Total Bilirubin: 0.4 mg/dL (ref 0.3–1.2)
Total Protein: 6.6 g/dL (ref 6.5–8.1)

## 2020-12-14 NOTE — Progress Notes (Signed)
Hematology and Oncology Follow Up Visit  Bruce Olson [id number] 1955/06/29 65 y.o. 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  He is unaccompanied for this visit. Overall, he is doing well without any significant limitations. His energy levels are fairly stable. He does get fatigued but tries to stay active. His appetite and weight are stable. His bowel habits are regular without diarrhea or constipation. He notes that if he eats too much, it will cause regurgitation. He continues to have chronic low back pain which he takes prescribed pain medication. He denies any dysphagia, odynophagia. He denies fevers, chills, night sweats, shortness of breath, chest pain or cough. He has no other complaints. Rest of the ROS is negative.  Medications:  Allergies as of 12/14/2020   No Known Allergies      Medication List        Accurate as of December 14, 2020 11:05 AM. If you have any questions, ask your nurse or doctor.          docusate sodium 100 MG capsule Commonly known as: COLACE Take 100 mg by mouth daily.   dronabinol 2.5 MG capsule Commonly known as: MARINOL Take 1 capsule (2.5 mg total) by mouth 2 (two) times daily before lunch and supper.   levothyroxine 50 MCG tablet Commonly known as: SYNTHROID TAKE 1 TABLET BY MOUTH EVERY MORNING ON AN EMPTY STOMACH   metoCLOPramide 10 MG tablet Commonly known as: REGLAN TAKE 1 TABLET BY MOUTH 4 TIMES DAILY   multivitamin with minerals Tabs tablet Take 1 tablet by mouth daily.   Narcan 4 MG/0.1ML Liqd nasal spray kit Generic drug: naloxone SMARTSIG:In Nostril Daily   OLANZapine 10 MG tablet Commonly known as: ZYPREXA TAKE 1 TABLET BY MOUTH EVERY NIGHT AT BEDTIME   oxyCODONE 20 mg 12 hr  tablet Commonly known as: OxyCONTIN Take 1 tablet (20 mg total) by mouth every 12 (twelve) hours.   oxyCODONE-acetaminophen 7.5-325 MG tablet Commonly known as: Percocet Take 1 tablet by mouth every 4 (four) hours as needed. May take one extra tablet when pain is sever. No more than 4 a day.   pilocarpine 5 MG tablet Commonly known as: SALAGEN TAKE 1 TABLET BY MOUTH 2 TIMES A DAY   pregabalin 100 MG capsule Commonly known as: LYRICA Take 1 capsule (100 mg total) by mouth 3 (three) times daily.        Allergies: No Known Allergies  Past Medical History, Surgical history, Social history, and Family History were reviewed and updated.  Review of Systems: Review of Systems  Constitutional:  Positive for malaise/fatigue. Negative for chills, fever and weight loss.  HENT: Negative.    Eyes: Negative.   Respiratory: Negative.    Cardiovascular: Negative.   Gastrointestinal:  Positive for vomiting.  Genitourinary: Negative.   Musculoskeletal:  Positive for back pain.  Skin: Negative.   Neurological: Negative.   Endo/Heme/Allergies: Negative.   Psychiatric/Behavioral: Negative.      Physical Exam:  weight is 125 lb (56.7 kg). His oral temperature is 97.6 F (36.4 C). His blood pressure is 169/78 (abnormal) and his pulse is 81. His respiration is 18 and oxygen saturation is 95%.   Wt Readings from Last 3 Encounters:  12/14/20 125 lb (  56.7 kg)  12/11/20 125 lb 3.2 oz (56.8 kg)  11/11/20 126 lb (57.2 kg)    Physical Activity: Not on file   Physical Exam Vitals reviewed.  Constitutional:      Appearance: Normal appearance.  HENT:     Head: Normocephalic and atraumatic.  Eyes:     Pupils: Pupils are equal, round, and reactive to light.  Cardiovascular:     Rate and Rhythm: Normal rate and regular rhythm.     Heart sounds: Normal heart sounds.  Pulmonary:     Effort: Pulmonary effort is normal.     Breath sounds: Normal breath sounds.  Abdominal:     General: Bowel  sounds are normal.     Palpations: Abdomen is soft.     Tenderness: There is no abdominal tenderness.  Musculoskeletal:        General: No tenderness or deformity. Normal range of motion.     Cervical back: Normal range of motion and neck supple.  Lymphadenopathy:     Cervical: No cervical adenopathy.  Skin:    General: Skin is warm and dry.     Findings: No erythema or rash.  Neurological:     Mental Status: He is alert and oriented to person, place, and time.  Psychiatric:        Behavior: Behavior normal.        Thought Content: Thought content normal.        Judgment: Judgment normal.      Lab Results  Component Value Date   WBC 7.3 12/14/2020   HGB 14.1 12/14/2020   HCT 42.1 12/14/2020   MCV 91.9 12/14/2020   PLT 201 12/14/2020   Lab Results  Component Value Date   FERRITIN 498 (H) 06/12/2020   IRON 53 06/12/2020   TIBC 265 06/12/2020   UIBC 212 06/12/2020   IRONPCTSAT 20 06/12/2020   Lab Results  Component Value Date   RETICCTPCT 1.7 06/12/2020   RBC 4.58 12/14/2020   No results found for: KPAFRELGTCHN, LAMBDASER, KAPLAMBRATIO No results found for: IGGSERUM, IGA, IGMSERUM No results found for: Odetta Pink, SPEI   Chemistry      Component Value Date/Time   NA 135 06/12/2020 1351   NA 138 01/20/2017 1324   NA 135 (L) 04/05/2016 1333   K 4.3 06/12/2020 1351   K 4.5 01/20/2017 1324   K 4.7 04/05/2016 1333   CL 100 06/12/2020 1351   CL 97 (L) 01/20/2017 1324   CO2 30 06/12/2020 1351   CO2 30 01/20/2017 1324   CO2 29 04/05/2016 1333   BUN 16 06/12/2020 1351   BUN 15 01/20/2017 1324   BUN 23.6 04/05/2016 1333   CREATININE 1.00 06/12/2020 1351   CREATININE 1.2 01/20/2017 1324   CREATININE 1.1 04/05/2016 1333      Component Value Date/Time   CALCIUM 9.5 06/12/2020 1351   CALCIUM 9.2 01/20/2017 1324   CALCIUM 9.2 04/05/2016 1333   ALKPHOS 57 06/12/2020 1351   ALKPHOS 51 01/20/2017 1324   ALKPHOS  67 04/05/2016 1333   AST 18 06/12/2020 1351   AST 17 04/05/2016 1333   ALT 14 06/12/2020 1351   ALT 26 01/20/2017 1324   ALT 10 04/05/2016 1333   BILITOT 0.4 06/12/2020 1351   BILITOT 0.30 04/05/2016 1333       Impression and Plan:  Bruce Olson is a 65 y.o. male who returns for a surveillance visit for squamous cell carcinoma of the  left tonsil.    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 CBC and CMP were unremarkable.  -There continues to be no evidence of recurrence.  -Recommend to continue on surveillance and return to the clinic in 6 months with repeat labs.   #Hypothyroidism: --Currently on Synthroid 50 mcg daily --TSH level is within normal limits.   #Iron deficiency without anemia: --Iron panel shows deficiency with serum iron 39, iron saturation 14% --Recommend IV feraheme x 2 doses.   Patient expressed understanding and satisfaction with the plan provided.   I have spent a total of 30 minutes minutes of face-to-face and non-face-to-face time, preparing to see the patient, performing a medically appropriate examination, counseling and educating the patient, documenting clinical information in the electronic health record, and care coordination.   Lincoln Brigham, PA-C Hematology/Oncology [facility]

## 2020-12-15 ENCOUNTER — Other Ambulatory Visit: Payer: Self-pay | Admitting: Hematology & Oncology

## 2020-12-15 ENCOUNTER — Ambulatory Visit: Payer: Medicare HMO | Admitting: Registered Nurse

## 2020-12-15 ENCOUNTER — Telehealth: Payer: Self-pay

## 2020-12-15 LAB — IRON AND TIBC
Iron: 39 ug/dL — ABNORMAL LOW (ref 42–163)
Saturation Ratios: 14 % — ABNORMAL LOW (ref 20–55)
TIBC: 269 ug/dL (ref 202–409)
UIBC: 230 ug/dL (ref 117–376)

## 2020-12-15 LAB — TSH: TSH: 0.962 u[IU]/mL (ref 0.320–4.118)

## 2020-12-15 LAB — FERRITIN: Ferritin: 418 ng/mL — ABNORMAL HIGH (ref 24–336)

## 2020-12-15 NOTE — Telephone Encounter (Signed)
-----   Message from [name], MD sent at 12/15/2020  9:08 AM EST ----- Call - the iron is low.  He needs IV iron as he cannot absorb oral iron.  Please set him up. [name]

## 2020-12-15 NOTE — Telephone Encounter (Signed)
Called and informed patient of lab results, patient verbalized understanding and denies any questions or concerns at this time. Need to check with insurance to see if Monoferric is covered or not. Advised pt we will be in touch with him once we know and will then schedule apt.

## 2020-12-17 ENCOUNTER — Encounter: Payer: Self-pay | Admitting: Hematology & Oncology

## 2020-12-17 DIAGNOSIS — E611 Iron deficiency: Secondary | ICD-10-CM | POA: Insufficient documentation

## 2020-12-21 LAB — DRUG TOX MONITOR 1 W/CONF, ORAL FLD
Amphetamines: NEGATIVE ng/mL (ref <10)
Barbiturates: NEGATIVE ng/mL (ref <10)
Benzodiazepines: NEGATIVE ng/mL (ref <0.50)
Buprenorphine: NEGATIVE ng/mL (ref <0.10)
Cocaine: NEGATIVE ng/mL (ref <5.0)
Codeine: NEGATIVE ng/mL (ref <2.5)
Cotinine: 112.4 ng/mL — ABNORMAL HIGH (ref <5.0)
Dihydrocodeine: NEGATIVE ng/mL (ref <2.5)
Fentanyl: NEGATIVE ng/mL (ref <0.10)
Heroin Metabolite: NEGATIVE ng/mL (ref <1.0)
Hydrocodone: NEGATIVE ng/mL (ref <2.5)
Hydromorphone: NEGATIVE ng/mL (ref <2.5)
MARIJUANA: NEGATIVE ng/mL (ref <2.5)
MDMA: NEGATIVE ng/mL (ref <10)
Meprobamate: NEGATIVE ng/mL (ref <2.5)
Methadone: NEGATIVE ng/mL (ref <5.0)
Morphine: NEGATIVE ng/mL (ref <2.5)
Nicotine Metabolite: POSITIVE ng/mL — AB (ref <5.0)
Norhydrocodone: NEGATIVE ng/mL (ref <2.5)
Noroxycodone: 84.3 ng/mL — ABNORMAL HIGH (ref <2.5)
Opiates: POSITIVE ng/mL — AB (ref <2.5)
Oxycodone: >250 ng/mL — ABNORMAL HIGH (ref <2.5)
Phencyclidine: NEGATIVE ng/mL (ref <10)
Tapentadol: NEGATIVE ng/mL (ref <5.0)
Tramadol: NEGATIVE ng/mL (ref <5.0)
Zolpidem: NEGATIVE ng/mL (ref <5.0)

## 2020-12-21 LAB — DRUG TOX ALC METAB W/CON, ORAL FLD: Alcohol Metabolite: NEGATIVE ng/mL (ref <25)

## 2020-12-22 ENCOUNTER — Telehealth: Payer: Self-pay | Admitting: *Deleted

## 2020-12-22 NOTE — Telephone Encounter (Signed)
Oral swab drug screen was consistent for prescribed medications.  ?

## 2021-01-11 ENCOUNTER — Other Ambulatory Visit: Payer: Self-pay

## 2021-01-11 ENCOUNTER — Encounter: Payer: Self-pay | Admitting: Registered Nurse

## 2021-01-11 ENCOUNTER — Encounter: Payer: Medicare HMO | Attending: Physical Medicine and Rehabilitation | Admitting: Registered Nurse

## 2021-01-11 VITALS — Ht 67.0 in | Wt 125.0 lb

## 2021-01-11 DIAGNOSIS — Z79891 Long term (current) use of opiate analgesic: Secondary | ICD-10-CM | POA: Insufficient documentation

## 2021-01-11 DIAGNOSIS — Z9889 Other specified postprocedural states: Secondary | ICD-10-CM | POA: Diagnosis not present

## 2021-01-11 DIAGNOSIS — R202 Paresthesia of skin: Secondary | ICD-10-CM | POA: Insufficient documentation

## 2021-01-11 DIAGNOSIS — M961 Postlaminectomy syndrome, not elsewhere classified: Secondary | ICD-10-CM | POA: Insufficient documentation

## 2021-01-11 DIAGNOSIS — G894 Chronic pain syndrome: Secondary | ICD-10-CM | POA: Diagnosis not present

## 2021-01-11 DIAGNOSIS — Z5181 Encounter for therapeutic drug level monitoring: Secondary | ICD-10-CM | POA: Insufficient documentation

## 2021-01-11 MED ORDER — OXYCODONE-ACETAMINOPHEN 7.5-325 MG PO TABS: 1.0000 | ORAL_TABLET | ORAL | 0 refills | Status: DC | PRN

## 2021-01-11 MED ORDER — OXYCODONE HCL ER 20 MG PO T12A: 20.0000 mg | EXTENDED_RELEASE_TABLET | Freq: Two times a day (BID) | ORAL | 0 refills | Status: DC

## 2021-01-11 NOTE — Progress Notes (Signed)
Subjective:    Patient ID: Bruce Olson, male    DOB: 12-Aug-1955, 65 y.o.   MRN: [id number]  HPI: Bruce Olson is a 65 y.o. male whose appointment was changed to a telephone visit, he called office reporting he was recently discharged from hospital. Office visit changed to telephone visit, he verbalizes understanding. Mr. Riel agrees with telephone visit and verbalizes understanding. He. states his pain is located in his lower back. He rates his pain 7. His current exercise regime is walking.   Mr. Arnaud was admitted to [facility] on 01/01/2021 and discharged on 01/05/2021, he was admitted with Acute Respiratory Failure with Hypoxia secondary to COPD Exacerbation and Influenza A. He is currently on Oxygen 2 liters. Discharge Summary was reviewed.  Mr. Weida Morphine equivalent is 94.00 MME.   Last Oral Swab was Performed on 12/11/2020, it was consistent.  Pain Inventory Average Pain 7 Pain Right Now 7 My pain is constant, sharp, burning, dull, stabbing, tingling, and aching  In the last 24 hours, has pain interfered with the following? General activity 3 Relation with others 2 Enjoyment of life 0 What TIME of day is your pain at its worst? morning  Sleep (in general) Poor  Pain is worse with: walking, bending, sitting, inactivity, standing, and some activites Pain improves with: medication and TENS Relief from Meds: 7  Family History  Problem Relation Age of Onset   Anesthesia problems Neg Hx    Hypotension Neg Hx    Malignant hyperthermia Neg Hx    Pseudochol deficiency Neg Hx    Social History   Socioeconomic History   Marital status: Single    Spouse name: Not on file   Number of children: Not on file   Years of education: Not on file   Highest education level: Not on file  Occupational History   Not on file  Tobacco Use   Smoking status: Every Day    Packs/day: 1.00    Years: 33.00    Pack years: 33.00    Types: Cigarettes    Smokeless tobacco: Never  Vaping Use   Vaping Use: Never used  Substance and Sexual Activity   Alcohol use: No   Drug use: No   Sexual activity: Never    Comment: back problems  Other Topics Concern   Not on file  Social History Narrative   Not on file   Social Determinants of Health   Financial Resource Strain: Not on file  Food Insecurity: Not on file  Transportation Needs: Not on file  Physical Activity: Not on file  Stress: Not on file  Social Connections: Not on file   Past Surgical History:  Procedure Laterality Date   BACK SURGERY  2013   Back surgery  (fusion)09/15/2010 & 2013   BACK SURGERY  03-2013   HARDWARE REMOVAL N/A 03/14/2013   Procedure: HARDWARE REMOVAL;  Surgeon: Eustace Moore, MD;  Location: [facility];  Service: Neurosurgery;  Laterality: N/A;   Past Surgical History:  Procedure Laterality Date   BACK SURGERY  2013   Back surgery  (fusion)09/15/2010 & 2013   BACK SURGERY  03-2013   HARDWARE REMOVAL N/A 03/14/2013   Procedure: HARDWARE REMOVAL;  Surgeon: Eustace Moore, MD;  Location: [facility];  Service: Neurosurgery;  Laterality: N/A;   Past Medical History:  Diagnosis Date   Goals of care, counseling/discussion 04/07/2016   Iron deficiency anemia due to chronic blood loss 10/21/2016   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  Opioid Risk Score:   Fall Risk Score:  `1  Depression screen PHQ 2/9  Depression screen [facility] 2/9 12/11/2020 11/11/2020 10/14/2020 09/08/2020 07/17/2020 04/21/2020 03/24/2020  Decreased Interest 0 0 0 0 0 0 0  Down, Depressed, Hopeless 0 0 0 0 0 0 0  PHQ - 2 Score 0 0 0 0 0 0 0  Altered sleeping - - - - - - -  Tired, decreased energy - - - - - - -  Change in appetite - - - - - - -  Feeling bad or failure about yourself  - - - - - - -  Trouble concentrating -  - - - - - -  Moving slowly or fidgety/restless - - - - - - -  Suicidal thoughts - - - - - - -  PHQ-9 Score - - - - - - -  Some recent data might be hidden    Review of Systems  Musculoskeletal:  Positive for back pain.  All other systems reviewed and are negative.     Objective:   Physical Exam Vitals and nursing note reviewed.  Musculoskeletal:     Comments: No Physical Exam Performed: Telephone Visit Today         Assessment & Plan:  1. Lumbar postlaminectomy syndrome status post lumbar fusion x3: His most recent surgery was 03/14/2013. He had hardware removal and bone allograft waist and L2-3.  Continue to Monitor. Continue current Medication Regimen.01/11/2021 Refilled: oxyCODONE 7.5/325mg  one tablet every 4 hours as needed #100 and :Oxycontin 20 mg one tablet every 12 hours #60. We will continue the opioid monitoring program, this consists of regular clinic visits, examinations, urine drug screen, pill counts as well as use of [place] Controlled Substance Reporting system. A 12 month History has been reviewed on the [place] on 01/11/2021.  2. Lumbar Radiculopathy: Continue current medication regimen with Lyrica. 01/11/2021 3. Insomnia: Continue current medication regimen with Trazodone.01/11/2021 4. Nausea: Continue Zofran as needed.Oncology Following.  01/11/2021 5. Cervical Spondylosis/ Cervicalgia/ Cervical Radiculitis:   Continue current medication regimen with  Lyrica. 01/11/2021 6. Cervical Myofascial Pain Syndrome: Continue current treatment regimen. Continue to monitor. 01/11/2021. 7. Paresthesia: Continue Lyrica: Continue to Monitor.  01/11/2021.   F/U in 1 month  Telephone Visit Established Patient Location of Patient: In his Home Location of Provider: In the Office Total Time Spent: In 10 Minutes

## 2021-02-04 ENCOUNTER — Other Ambulatory Visit: Payer: Self-pay | Admitting: Hematology & Oncology

## 2021-02-10 ENCOUNTER — Other Ambulatory Visit: Payer: Self-pay

## 2021-02-10 ENCOUNTER — Encounter: Payer: Medicare HMO | Attending: Physical Medicine and Rehabilitation | Admitting: Registered Nurse

## 2021-02-10 ENCOUNTER — Encounter: Payer: Self-pay | Admitting: Registered Nurse

## 2021-02-10 ENCOUNTER — Telehealth: Payer: Self-pay

## 2021-02-10 VITALS — BP 122/69 | HR 86 | Temp 97.9°F | Ht 67.0 in | Wt 117.0 lb

## 2021-02-10 DIAGNOSIS — M546 Pain in thoracic spine: Secondary | ICD-10-CM | POA: Diagnosis present

## 2021-02-10 DIAGNOSIS — Z5181 Encounter for therapeutic drug level monitoring: Secondary | ICD-10-CM | POA: Insufficient documentation

## 2021-02-10 DIAGNOSIS — Z9889 Other specified postprocedural states: Secondary | ICD-10-CM | POA: Diagnosis present

## 2021-02-10 DIAGNOSIS — G8929 Other chronic pain: Secondary | ICD-10-CM | POA: Diagnosis present

## 2021-02-10 DIAGNOSIS — G894 Chronic pain syndrome: Secondary | ICD-10-CM | POA: Insufficient documentation

## 2021-02-10 DIAGNOSIS — Z79891 Long term (current) use of opiate analgesic: Secondary | ICD-10-CM | POA: Insufficient documentation

## 2021-02-10 DIAGNOSIS — M961 Postlaminectomy syndrome, not elsewhere classified: Secondary | ICD-10-CM | POA: Diagnosis present

## 2021-02-10 DIAGNOSIS — R202 Paresthesia of skin: Secondary | ICD-10-CM | POA: Insufficient documentation

## 2021-02-10 MED ORDER — PREGABALIN 100 MG PO CAPS: 100.0000 mg | ORAL_CAPSULE | Freq: Three times a day (TID) | ORAL | 5 refills | Status: DC

## 2021-02-10 MED ORDER — OXYCODONE-ACETAMINOPHEN 7.5-325 MG PO TABS: 1.0000 | ORAL_TABLET | ORAL | 0 refills | Status: DC | PRN

## 2021-02-10 MED ORDER — OXYCODONE HCL ER 20 MG PO T12A: 20.0000 mg | EXTENDED_RELEASE_TABLET | Freq: Two times a day (BID) | ORAL | 0 refills | Status: DC

## 2021-02-10 NOTE — Progress Notes (Signed)
Subjective:    Patient ID: Bruce Olson, male    DOB: Jun 09, 1955, 66 y.o.   MRN: [id number]  HPI: Bruce Olson is a 66 y.o. male who returns for follow up appointment for chronic pain and medication refill. He states his pain is located in his  mid- lower back and right lower extremity with numbness. He rates his pain 7. His current exercise regime is walking and performing stretching exercises.  [name] Morphine equivalent is 60.00 MME.   Last Oral Swab was Performed on 12/11/2020, it was consistent.     Pain Inventory Average Pain 7 Pain Right Now 7 My pain is sharp, stabbing, and tingling  In the last 24 hours, has pain interfered with the following? General activity 7 Relation with others 7 Enjoyment of life 7 What TIME of day is your pain at its worst? morning , daytime, evening, and night Sleep (in general) Poor  Pain is worse with: walking, sitting, and standing Pain improves with: heat/ice, medication, and TENS Relief from Meds: 3  Family History  Problem Relation Age of Onset   Anesthesia problems Neg Hx    Hypotension Neg Hx    Malignant hyperthermia Neg Hx    Pseudochol deficiency Neg Hx    Social History   Socioeconomic History   Marital status: Single    Spouse name: Not on file   Number of children: Not on file   Years of education: Not on file   Highest education level: Not on file  Occupational History   Not on file  Tobacco Use   Smoking status: Every Day    Packs/day: 1.00    Years: 33.00    Pack years: 33.00    Types: Cigarettes   Smokeless tobacco: Never  Vaping Use   Vaping Use: Never used  Substance and Sexual Activity   Alcohol use: No   Drug use: No   Sexual activity: Never    Comment: back problems  Other Topics Concern   Not on file  Social History Narrative   Not on file   Social Determinants of Health   Financial Resource Strain: Not on file  Food Insecurity: Not on file  Transportation Needs: Not on  file  Physical Activity: Not on file  Stress: Not on file  Social Connections: Not on file   Past Surgical History:  Procedure Laterality Date   BACK SURGERY  2013   Back surgery  (fusion)09/15/2010 & 2013   BACK SURGERY  03-2013   HARDWARE REMOVAL N/A 03/14/2013   Procedure: HARDWARE REMOVAL;  Surgeon: Bruce Moore, MD;  Location: [facility];  Service: Neurosurgery;  Laterality: N/A;   Past Surgical History:  Procedure Laterality Date   BACK SURGERY  2013   Back surgery  (fusion)09/15/2010 & 2013   BACK SURGERY  03-2013   HARDWARE REMOVAL N/A 03/14/2013   Procedure: HARDWARE REMOVAL;  Surgeon: Bruce Moore, MD;  Location: [place] [facility]UR[facility] OR[facility];  Service: Neurosurgery;  Laterality: N/A;   Past Medical History:  Diagnosis Date   Goals of care, counseling/discussion 04/07/2016   Iron deficiency anemia due to chronic blood loss 10/21/2016   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   No pertinent past medical history    Occupational injury 2013   resulted in back injury that led to surgery   Thyroid cancer ([place])    BP 122/69    Pulse 86    Temp 97.9 F (36.6 C)    Ht 5\' 7"  (1.702 m)  Wt 117 lb (53.1 kg)    SpO2 93%    BMI 18.32 kg/m   Opioid Risk Score:   Fall Risk Score:  `1  Depression screen PHQ 2/9  Depression screen [facility] 2/9 01/11/2021 12/11/2020 11/11/2020 10/14/2020 09/08/2020 07/17/2020 04/21/2020  Decreased Interest 0 0 0 0 0 0 0  Down, Depressed, Hopeless 0 0 0 0 0 0 0  PHQ - 2 Score 0 0 0 0 0 0 0  Altered sleeping - - - - - - -  Tired, decreased energy - - - - - - -  Change in appetite - - - - - - -  Feeling bad or failure about yourself  - - - - - - -  Trouble concentrating - - - - - - -  Moving slowly or fidgety/restless - - - - - - -  Suicidal thoughts - - - - - - -  PHQ-9 Score - - - - - - -  Some recent data might be hidden     Review of Systems  Constitutional: Negative.   HENT: Negative.    Eyes: Negative.   Respiratory: Negative.    Cardiovascular: Negative.    Gastrointestinal: Negative.   Endocrine: Negative.   Musculoskeletal:  Positive for back pain.  Skin: Negative.   Allergic/Immunologic: Negative.   Neurological:  Positive for weakness and numbness.  Hematological: Negative.   Psychiatric/Behavioral:  Positive for sleep disturbance.       Objective:   Physical Exam Vitals and nursing note reviewed.  Constitutional:      Appearance: Normal appearance.  Cardiovascular:     Rate and Rhythm: Normal rate and regular rhythm.     Pulses: Normal pulses.     Heart sounds: Normal heart sounds.  Pulmonary:     Effort: Pulmonary effort is normal.     Breath sounds: Normal breath sounds.  Musculoskeletal:     Cervical back: Normal range of motion and neck supple.     Comments: Normal Muscle Bulk and Muscle Testing Reveals:  Upper Extremities: Full ROM and Muscle Strength 5/5 Thoracic and Lumbar Hypersensitivity Lower Extremities : Full ROM and Muscle Strength 5/5 Arises from Table Slowly, using cane for support Antalgic Gait     Skin:    General: Skin is warm and dry.  Neurological:     Mental Status: He is alert and oriented to person, place, and time.  Psychiatric:        Mood and Affect: Mood normal.        Behavior: Behavior normal.         Assessment & Plan:  1. Lumbar postlaminectomy syndrome status post lumbar fusion x3: His most recent surgery was 03/14/2013. He had hardware removal and bone allograft waist and L2-3.  Continue to Monitor. Continue current Medication Regimen.02/10/2021 Refilled: oxyCODONE 7.5/325mg  one tablet every 4 hours as needed #100 and :Oxycontin 20 mg one tablet every 12 hours #60. We will continue the opioid monitoring program, this consists of regular clinic visits, examinations, urine drug screen, pill counts as well as use of [place] Controlled Substance Reporting system. A 12 month History has been reviewed on the [place] on 02/10/2021.  2.  Lumbar Radiculopathy: Continue current medication regimen with Lyrica. 02/10/2021 3. Insomnia: Continue current medication regimen with Trazodone.02/10/2021 4. Nausea: Continue Zofran as needed.Oncology Following.  02/10/2021 5. Cervical Spondylosis/ Cervicalgia/ Cervical Radiculitis:  No complaints today. Continue current medication regimen with  Lyrica. 02/10/2021 6. Cervical Myofascial  Pain Syndrome: Continue current treatment regimen. Continue to monitor. 01/11/2021. 7. Paresthesia: Continue Lyrica: Continue to Monitor.  02/10/2021.   F/U in 1 month

## 2021-02-10 NOTE — Telephone Encounter (Signed)
PA for Oxycontin sent to insurance

## 2021-02-11 NOTE — Telephone Encounter (Signed)
Oxycontin 12hr tab approved 01/31/2021-01/31/2020. Pharmacy informed.

## 2021-02-16 ENCOUNTER — Other Ambulatory Visit: Payer: Self-pay | Admitting: Hematology & Oncology

## 2021-02-16 DIAGNOSIS — C090 Malignant neoplasm of tonsillar fossa: Secondary | ICD-10-CM

## 2021-02-16 NOTE — Telephone Encounter (Signed)
Last refilled 09/10/20 for # 30 with 3 Rfs. Last OV 12/14/20 - 6 month f/u scheduled. Please advise for refills, thanks!

## 2021-03-12 ENCOUNTER — Encounter: Payer: Medicare HMO | Attending: Physical Medicine and Rehabilitation | Admitting: Registered Nurse

## 2021-03-12 ENCOUNTER — Other Ambulatory Visit: Payer: Self-pay

## 2021-03-12 ENCOUNTER — Encounter: Payer: Self-pay | Admitting: Registered Nurse

## 2021-03-12 VITALS — BP 123/68 | HR 77 | Ht 67.0 in | Wt 115.0 lb

## 2021-03-12 DIAGNOSIS — Z79891 Long term (current) use of opiate analgesic: Secondary | ICD-10-CM | POA: Insufficient documentation

## 2021-03-12 DIAGNOSIS — Z9889 Other specified postprocedural states: Secondary | ICD-10-CM | POA: Diagnosis not present

## 2021-03-12 DIAGNOSIS — M961 Postlaminectomy syndrome, not elsewhere classified: Secondary | ICD-10-CM | POA: Diagnosis not present

## 2021-03-12 DIAGNOSIS — G894 Chronic pain syndrome: Secondary | ICD-10-CM | POA: Insufficient documentation

## 2021-03-12 DIAGNOSIS — Z5181 Encounter for therapeutic drug level monitoring: Secondary | ICD-10-CM | POA: Insufficient documentation

## 2021-03-12 DIAGNOSIS — R202 Paresthesia of skin: Secondary | ICD-10-CM | POA: Insufficient documentation

## 2021-03-12 MED ORDER — OXYCODONE-ACETAMINOPHEN 7.5-325 MG PO TABS: 1.0000 | ORAL_TABLET | ORAL | 0 refills | Status: DC | PRN

## 2021-03-12 MED ORDER — OXYCODONE HCL ER 20 MG PO T12A: 20.0000 mg | EXTENDED_RELEASE_TABLET | Freq: Two times a day (BID) | ORAL | 0 refills | Status: DC

## 2021-03-12 NOTE — Progress Notes (Signed)
Subjective:    Patient ID: Bruce Olson, male    DOB: 28-Aug-1955, 66 y.o.   MRN: [id number]  HPI: Bruce Olson is a 66 y.o. male who returns for follow up appointment for chronic pain and medication refill. He states his pain is located in his mid- lower back pain and right leg numbness with tingling. He rates his pain 7. His current exercise regime is walking and performing stretching exercises.  Bruce Olson Morphine equivalent is 97.50 MME.   Oral Swab was Performed on 11/11/2-22, it was consistent.     Pain Inventory Average Pain 7 Pain Right Now 7 My pain is sharp, stabbing, and tingling  In the last 24 hours, has pain interfered with the following? General activity 7 Relation with others 7 Enjoyment of life 7 What TIME of day is your pain at its worst? morning , daytime, evening, and night Sleep (in general) Poor  Pain is worse with: walking, sitting, and standing Pain improves with: heat/ice, medication, and TENS Relief from Meds: 3  Family History  Problem Relation Age of Onset   Anesthesia problems Neg Hx    Hypotension Neg Hx    Malignant hyperthermia Neg Hx    Pseudochol deficiency Neg Hx    Social History   Socioeconomic History   Marital status: Single    Spouse name: Not on file   Number of children: Not on file   Years of education: Not on file   Highest education level: Not on file  Occupational History   Not on file  Tobacco Use   Smoking status: Every Day    Packs/day: 1.00    Years: 33.00    Pack years: 33.00    Types: Cigarettes   Smokeless tobacco: Never  Vaping Use   Vaping Use: Never used  Substance and Sexual Activity   Alcohol use: No   Drug use: No   Sexual activity: Never    Comment: back problems  Other Topics Concern   Not on file  Social History Narrative   Not on file   Social Determinants of Health   Financial Resource Strain: Not on file  Food Insecurity: Not on file  Transportation Needs: Not on file   Physical Activity: Not on file  Stress: Not on file  Social Connections: Not on file   Past Surgical History:  Procedure Laterality Date   BACK SURGERY  2013   Back surgery  (fusion)09/15/2010 & 2013   BACK SURGERY  03-2013   HARDWARE REMOVAL N/A 03/14/2013   Procedure: HARDWARE REMOVAL;  Surgeon: Eustace Moore, MD;  Location: [facility];  Service: Neurosurgery;  Laterality: N/A;   Past Surgical History:  Procedure Laterality Date   BACK SURGERY  2013   Back surgery  (fusion)09/15/2010 & 2013   BACK SURGERY  03-2013   HARDWARE REMOVAL N/A 03/14/2013   Procedure: HARDWARE REMOVAL;  Surgeon: Eustace Moore, MD;  Location: [place] [facility];  Service: Neurosurgery;  Laterality: N/A;   Past Medical History:  Diagnosis Date   Goals of care, counseling/discussion 04/07/2016   Iron deficiency anemia due to chronic blood loss 10/21/2016   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   No pertinent past medical history    Occupational injury 2013   resulted in back injury that led to surgery   Thyroid cancer ([place])    BP 123/68    Pulse 77    Ht 5\' 7"  (1.702 m)    Wt 115 lb (52.2 kg)  SpO2 92%    BMI 18.01 kg/m   Opioid Risk Score:   Fall Risk Score:  `1  Depression screen PHQ 2/9  Depression screen [facility] 2/9 03/12/2021 01/11/2021 12/11/2020 11/11/2020 10/14/2020 09/08/2020 07/17/2020  Decreased Interest 0 0 0 0 0 0 0  Down, Depressed, Hopeless 0 0 0 0 0 0 0  PHQ - 2 Score 0 0 0 0 0 0 0  Altered sleeping - - - - - - -  Tired, decreased energy - - - - - - -  Change in appetite - - - - - - -  Feeling bad or failure about yourself  - - - - - - -  Trouble concentrating - - - - - - -  Moving slowly or fidgety/restless - - - - - - -  Suicidal thoughts - - - - - - -  PHQ-9 Score - - - - - - -  Some recent data might be hidden     Review of Systems  Constitutional: Negative.   HENT: Negative.    Eyes: Negative.   Respiratory: Negative.    Cardiovascular: Negative.   Gastrointestinal: Negative.   Endocrine:  Negative.   Genitourinary: Negative.   Musculoskeletal:  Positive for back pain.  Skin: Negative.   Allergic/Immunologic: Negative.   Neurological:  Positive for numbness.  Hematological: Negative.   Psychiatric/Behavioral:  Positive for sleep disturbance.       Objective:   Physical Exam Vitals and nursing note reviewed.  Constitutional:      Appearance: Normal appearance.  Cardiovascular:     Rate and Rhythm: Normal rate and regular rhythm.     Pulses: Normal pulses.     Heart sounds: Normal heart sounds.  Pulmonary:     Effort: Pulmonary effort is normal.     Breath sounds: Normal breath sounds.  Musculoskeletal:     Cervical back: Normal range of motion and neck supple.     Comments: Normal Muscle Bulk and Muscle Testing Reveals:  Upper Extremities: Full ROM and Muscle Strength 5/5 Thoracic and Lumbar Hypersensitivity  Lower Extremities: Full ROM and Muscle Strength 5/5 Arises from Table slowly using cane for support Antalgic Gait     Skin:    General: Skin is warm and dry.  Neurological:     Mental Status: He is alert and oriented to person, place, and time.  Psychiatric:        Mood and Affect: Mood normal.        Behavior: Behavior normal.         Assessment & Plan:  1. Lumbar postlaminectomy syndrome status post lumbar fusion x3: His most recent surgery was 03/14/2013. He had hardware removal and bone allograft waist and L2-3.  Continue to Monitor. Continue current Medication Regimen.03/12/2021 Refilled: oxyCODONE 7.5/325mg  one tablet every 4 hours as needed #100 and :Oxycontin 20 mg one tablet every 12 hours #60. We will continue the opioid monitoring program, this consists of regular clinic visits, examinations, urine drug screen, pill counts as well as use of [place] Controlled Substance Reporting system. A 12 month History has been reviewed on the [place] on 03/12/2021.  2. Lumbar Radiculopathy: Continue  current medication regimen with Lyrica. 03/12/2021 3. Insomnia: Continue current medication regimen with Trazodone.03/12/2021 4. Nausea: Continue Zofran as needed.Oncology Following.  03/12/2021 5. Cervical Spondylosis/ Cervicalgia/ Cervical Radiculitis:  No complaints today. Continue current medication regimen with  Lyrica. 03/12/2021 6. Cervical Myofascial Pain Syndrome: Continue current treatment regimen.  Continue to monitor. 03/12/2021. 7. Paresthesia: Continue Lyrica: Continue to Monitor.  03/12/2021.   F/U in 1 month

## 2021-04-06 ENCOUNTER — Encounter: Payer: Medicare HMO | Attending: Physical Medicine and Rehabilitation | Admitting: Registered Nurse

## 2021-04-06 ENCOUNTER — Other Ambulatory Visit: Payer: Self-pay

## 2021-04-06 ENCOUNTER — Encounter: Payer: Self-pay | Admitting: Registered Nurse

## 2021-04-06 VITALS — BP 157/78 | HR 66 | Ht 67.0 in | Wt 115.0 lb

## 2021-04-06 DIAGNOSIS — G894 Chronic pain syndrome: Secondary | ICD-10-CM | POA: Diagnosis not present

## 2021-04-06 DIAGNOSIS — M961 Postlaminectomy syndrome, not elsewhere classified: Secondary | ICD-10-CM | POA: Diagnosis not present

## 2021-04-06 DIAGNOSIS — G8929 Other chronic pain: Secondary | ICD-10-CM | POA: Diagnosis present

## 2021-04-06 DIAGNOSIS — R202 Paresthesia of skin: Secondary | ICD-10-CM | POA: Diagnosis present

## 2021-04-06 DIAGNOSIS — M546 Pain in thoracic spine: Secondary | ICD-10-CM | POA: Diagnosis present

## 2021-04-06 DIAGNOSIS — Z79891 Long term (current) use of opiate analgesic: Secondary | ICD-10-CM | POA: Insufficient documentation

## 2021-04-06 DIAGNOSIS — Z5181 Encounter for therapeutic drug level monitoring: Secondary | ICD-10-CM | POA: Insufficient documentation

## 2021-04-06 DIAGNOSIS — Z9889 Other specified postprocedural states: Secondary | ICD-10-CM | POA: Insufficient documentation

## 2021-04-06 MED ORDER — OXYCODONE-ACETAMINOPHEN 7.5-325 MG PO TABS: 1.0000 | ORAL_TABLET | ORAL | 0 refills | Status: DC | PRN

## 2021-04-06 MED ORDER — OXYCODONE HCL ER 20 MG PO T12A: 20.0000 mg | EXTENDED_RELEASE_TABLET | Freq: Two times a day (BID) | ORAL | 0 refills | Status: DC

## 2021-04-06 NOTE — Progress Notes (Signed)
? ?Subjective:  ? ? Patient ID: Bruce Olson, male    DOB: Sep 03, 1955, 66 y.o.   MRN: [id number] ? ?HPI: Bruce Olson is a 66 y.o. male who returns for follow up appointment for chronic pain and medication refill. He states his  pain is located in his mid- lower back and right lower extremity numbness. He rates his pain 7. His current exercise regime is walking and performing stretching exercises ? ?Ms. Dicamillo Morphine equivalent is 105.00 MME.   Last Oral Swab was Performed on 12/11/2020, it was consistent.  ?  ? ? ?Pain Inventory ?Average Pain 7 ?Pain Right Now 7 ?My pain is sharp, stabbing, and tingling ? ?In the last 24 hours, has pain interfered with the following? ?General activity 7 ?Relation with others 7 ?Enjoyment of life 7 ?What TIME of day is your pain at its worst? morning , daytime, evening, and night ?Sleep (in general) Good ? ?Pain is worse with: walking, sitting, and standing ?Pain improves with: heat/ice, medication, and TENS ?Relief from Meds: 3 ? ?Family History  ?Problem Relation Age of Onset  ? Anesthesia problems Neg Hx   ? Hypotension Neg Hx   ? Malignant hyperthermia Neg Hx   ? Pseudochol deficiency Neg Hx   ? ?Social History  ? ?Socioeconomic History  ? Marital status: Single  ?  Spouse name: Not on file  ? Number of children: Not on file  ? Years of education: Not on file  ? Highest education level: Not on file  ?Occupational History  ? Not on file  ?Tobacco Use  ? Smoking status: Every Day  ?  Packs/day: 1.00  ?  Years: 33.00  ?  Pack years: 33.00  ?  Types: Cigarettes  ? Smokeless tobacco: Never  ?Vaping Use  ? Vaping Use: Never used  ?Substance and Sexual Activity  ? Alcohol use: No  ? Drug use: No  ? Sexual activity: Never  ?  Comment: back problems  ?Other Topics Concern  ? Not on file  ?Social History Narrative  ? Not on file  ? ?Social Determinants of Health  ? ?Financial Resource Strain: Not on file  ?Food Insecurity: Not on file  ?Transportation Needs: Not on file   ?Physical Activity: Not on file  ?Stress: Not on file  ?Social Connections: Not on file  ? ?Past Surgical History:  ?Procedure Laterality Date  ? BACK SURGERY  2013  ? Back surgery  (fusion)09/15/2010 & 2013  ? BACK SURGERY  03-2013  ? HARDWARE REMOVAL N/A 03/14/2013  ? Procedure: HARDWARE REMOVAL;  Surgeon: Eustace Moore, MD;  Location: [place] [facility];  Service: Neurosurgery;  Laterality: N/A;  ? ?Past Surgical History:  ?Procedure Laterality Date  ? BACK SURGERY  2013  ? Back surgery  (fusion)09/15/2010 & 2013  ? BACK SURGERY  03-2013  ? HARDWARE REMOVAL N/A 03/14/2013  ? Procedure: HARDWARE REMOVAL;  Surgeon: Eustace Moore, MD;  Location: [place] [facility];  Service: Neurosurgery;  Laterality: N/A;  ? ?Past Medical History:  ?Diagnosis Date  ? Goals of care, counseling/discussion 04/07/2016  ? Iron deficiency anemia due to chronic blood loss 10/21/2016  ? 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 No pertinent past medical history   ? Occupational injury 2013  ? resulted in back injury that led to surgery  ? Thyroid cancer ([place])   ? ?BP (!) 165/76   Pulse 67   Ht '5\' 7"'$  (1.702 m)   Wt 115 lb (52.2 kg) Comment: last  recorded  SpO2 95%   BMI 18.01 kg/m?  ? ?Opioid Risk Score:   ?Fall Risk Score:  `1 ? ?Depression screen PHQ 2/9 ? ?Depression screen [facility] 2/9 03/12/2021 01/11/2021 12/11/2020 11/11/2020 10/14/2020 09/08/2020 07/17/2020  ?Decreased Interest 0 0 0 0 0 0 0  ?Down, Depressed, Hopeless 0 0 0 0 0 0 0  ?PHQ - 2 Score 0 0 0 0 0 0 0  ?Altered sleeping - - - - - - -  ?Tired, decreased energy - - - - - - -  ?Change in appetite - - - - - - -  ?Feeling bad or failure about yourself  - - - - - - -  ?Trouble concentrating - - - - - - -  ?Moving slowly or fidgety/restless - - - - - - -  ?Suicidal thoughts - - - - - - -  ?PHQ-9 Score - - - - - - -  ?Some recent data might be hidden  ?  ?Review of Systems  ?Constitutional: Negative.   ?HENT: Negative.    ?Eyes: Negative.   ?Respiratory: Negative.    ?Cardiovascular: Negative.   ?Gastrointestinal:  Negative.   ?Endocrine: Negative.   ?Genitourinary: Negative.   ?Musculoskeletal:  Positive for back pain.  ?     Right leg  ?Skin: Negative.   ?Allergic/Immunologic: Negative.   ?Neurological: Negative.   ?Hematological: Negative.   ?Psychiatric/Behavioral: Negative.    ?All other systems reviewed and are negative. ? ?   ?Objective:  ? Physical Exam ?Vitals and nursing note reviewed.  ?Constitutional:   ?   Appearance: Normal appearance.  ?Cardiovascular:  ?   Rate and Rhythm: Normal rate and regular rhythm.  ?   Pulses: Normal pulses.  ?   Heart sounds: Normal heart sounds.  ?Pulmonary:  ?   Effort: Pulmonary effort is normal.  ?   Breath sounds: Normal breath sounds.  ?Musculoskeletal:  ?   Cervical back: Normal range of motion and neck supple.  ?   Comments: Normal Muscle Bulk and Muscle Testing Reveals:  ?Upper Extremities: Full ROM and Muscle Strength 5/5 ?Thoracic and Lumbar Hypersensitivity ?Lower Extremities: Full ROM and Muscle Strength 5/5 ?Arises from table slowly  ?Narrow Based  Gait  ?   ?Skin: ?   General: Skin is warm and dry.  ?Neurological:  ?   Mental Status: He is alert and oriented to person, place, and time.  ?Psychiatric:     ?   Mood and Affect: Mood normal.     ?   Behavior: Behavior normal.  ? ? ? ? ?   ?Assessment & Plan:  ?1. Lumbar postlaminectomy syndrome status post lumbar fusion x3: His most recent surgery was 03/14/2013. He had hardware removal and bone allograft waist and L2-3.  Continue to Monitor. Continue current Medication Regimen.04/06/2021 ?Refilled: oxyCODONE 7.5/'325mg'$  one tablet every 4 hours as needed #100 and :Oxycontin 20 mg one tablet every 12 hours #60. ?We will continue the opioid monitoring program, this consists of regular clinic visits, examinations, urine drug screen, pill counts as well as use of [place] Controlled Substance Reporting system. A 12 month History has been reviewed on the [place] Controlled Substance Reporting System on 04/06/2021.  ?2.  Lumbar Radiculopathy: Continue current medication regimen with Lyrica. 04/06/2021 ?3. Insomnia: Continue current medication regimen with Trazodone.04/06/2021 ?4. Nausea: Continue Zofran as needed.Oncology Following.  04/06/2021 ?5. Cervical Spondylosis/ Cervicalgia/ Cervical Radiculitis:  No complaints today. Continue current medication regimen with  Lyrica. 04/06/2021 ?6.  Cervical Myofascial Pain Syndrome: Continue current treatment regimen. Continue to monitor. 04/06/2021. ?7. Paresthesia: Continue Lyrica: Continue to Monitor.  04/06/2021. ?  ?F/U in 1 month ?  ? ?

## 2021-04-09 ENCOUNTER — Encounter: Payer: Medicare HMO | Admitting: Registered Nurse

## 2021-05-10 ENCOUNTER — Encounter: Payer: Self-pay | Admitting: Registered Nurse

## 2021-05-10 ENCOUNTER — Encounter: Payer: Medicare HMO | Attending: Physical Medicine and Rehabilitation | Admitting: Registered Nurse

## 2021-05-10 VITALS — BP 139/77 | HR 82 | Ht 67.0 in | Wt 115.0 lb

## 2021-05-10 DIAGNOSIS — G8929 Other chronic pain: Secondary | ICD-10-CM | POA: Diagnosis present

## 2021-05-10 DIAGNOSIS — Z5181 Encounter for therapeutic drug level monitoring: Secondary | ICD-10-CM | POA: Insufficient documentation

## 2021-05-10 DIAGNOSIS — G894 Chronic pain syndrome: Secondary | ICD-10-CM | POA: Diagnosis present

## 2021-05-10 DIAGNOSIS — Z9889 Other specified postprocedural states: Secondary | ICD-10-CM | POA: Diagnosis present

## 2021-05-10 DIAGNOSIS — M546 Pain in thoracic spine: Secondary | ICD-10-CM | POA: Diagnosis present

## 2021-05-10 DIAGNOSIS — Z79891 Long term (current) use of opiate analgesic: Secondary | ICD-10-CM | POA: Insufficient documentation

## 2021-05-10 DIAGNOSIS — M961 Postlaminectomy syndrome, not elsewhere classified: Secondary | ICD-10-CM | POA: Diagnosis present

## 2021-05-10 DIAGNOSIS — R202 Paresthesia of skin: Secondary | ICD-10-CM | POA: Insufficient documentation

## 2021-05-10 MED ORDER — OXYCODONE HCL ER 20 MG PO T12A: 20.0000 mg | EXTENDED_RELEASE_TABLET | Freq: Two times a day (BID) | ORAL | 0 refills | Status: DC

## 2021-05-10 MED ORDER — OXYCODONE-ACETAMINOPHEN 7.5-325 MG PO TABS: 1.0000 | ORAL_TABLET | ORAL | 0 refills | Status: DC | PRN

## 2021-05-10 NOTE — Progress Notes (Signed)
? ?Subjective:  ? ? Patient ID: Bruce Olson, male    DOB: 28-Feb-1955, 67 y.o.   MRN: [id number] ? ?HPI: Bruce Olson is a 66 y.o. male who returns for follow up appointment for chronic pain and medication refill. He states his pain is located in his mid- lower back also reports right lower extremity pain with numbness and tingling. He rates his pain 7. His current exercise regime is walking and performing stretching exercises. ? ?Mr. Deats Morphine equivalent is 91.50 MME.   Last Oral Swab was Performed on 12/11/2021, it was consistent.  ?  ? ?Pain Inventory ?Average Pain 7 ?Pain Right Now 7 ?My pain is sharp, stabbing, and tingling ? ?In the last 24 hours, has pain interfered with the following? ?General activity 7 ?Relation with others 7 ?Enjoyment of life 7 ?What TIME of day is your pain at its worst? morning , daytime, evening, and night ?Sleep (in general) Poor ? ?Pain is worse with: walking, sitting, and standing ?Pain improves with: heat/ice, medication, and TENS ?Relief from Meds: 3 ? ?Family History  ?Problem Relation Age of Onset  ? Anesthesia problems Neg Hx   ? Hypotension Neg Hx   ? Malignant hyperthermia Neg Hx   ? Pseudochol deficiency Neg Hx   ? ?Social History  ? ?Socioeconomic History  ? Marital status: Single  ?  Spouse name: Not on file  ? Number of children: Not on file  ? Years of education: Not on file  ? Highest education level: Not on file  ?Occupational History  ? Not on file  ?Tobacco Use  ? Smoking status: Every Day  ?  Packs/day: 1.00  ?  Years: 33.00  ?  Pack years: 33.00  ?  Types: Cigarettes  ? Smokeless tobacco: Never  ?Vaping Use  ? Vaping Use: Never used  ?Substance and Sexual Activity  ? Alcohol use: No  ? Drug use: No  ? Sexual activity: Never  ?  Comment: back problems  ?Other Topics Concern  ? Not on file  ?Social History Narrative  ? Not on file  ? ?Social Determinants of Health  ? ?Financial Resource Strain: Not on file  ?Food Insecurity: Not on file   ?Transportation Needs: Not on file  ?Physical Activity: Not on file  ?Stress: Not on file  ?Social Connections: Not on file  ? ?Past Surgical History:  ?Procedure Laterality Date  ? BACK SURGERY  2013  ? Back surgery  (fusion)09/15/2010 & 2013  ? BACK SURGERY  03-2013  ? HARDWARE REMOVAL N/A 03/14/2013  ? Procedure: HARDWARE REMOVAL;  Surgeon: Eustace Moore, MD;  Location: [place] [facility];  Service: Neurosurgery;  Laterality: N/A;  ? ?Past Surgical History:  ?Procedure Laterality Date  ? BACK SURGERY  2013  ? Back surgery  (fusion)09/15/2010 & 2013  ? BACK SURGERY  03-2013  ? HARDWARE REMOVAL N/A 03/14/2013  ? Procedure: HARDWARE REMOVAL;  Surgeon: Eustace Moore, MD;  Location: [place] [facility];  Service: Neurosurgery;  Laterality: N/A;  ? ?Past Medical History:  ?Diagnosis Date  ? Goals of care, counseling/discussion 04/07/2016  ? Iron deficiency anemia due to chronic blood loss 10/21/2016  ? 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 No pertinent past medical history   ? Occupational injury 2013  ? resulted in back injury that led to surgery  ? Thyroid cancer ([place])   ? ?BP 139/77   Pulse 82   Ht '5\' 7"'$  (1.702 m)   Wt 115 lb (52.2 kg)  SpO2 91%   BMI 18.01 kg/m?  ? ?Opioid Risk Score:   ?Fall Risk Score:  `1 ? ?Depression screen PHQ 2/9 ? ? ?  04/06/2021  ? 12:51 PM 03/12/2021  ?  1:00 PM 01/11/2021  ?  1:23 PM 12/11/2020  ?  1:02 PM 11/11/2020  ? 11:11 AM 10/14/2020  ?  1:08 PM 09/08/2020  ?  1:19 PM  ?Depression screen PHQ 2/9  ?Decreased Interest 0 0 0 0 0 0 0  ?Down, Depressed, Hopeless 0 0 0 0 0 0 0  ?PHQ - 2 Score 0 0 0 0 0 0 0  ?  ? ?Review of Systems  ?Musculoskeletal:  Positive for back pain.  ?     Right leg pain  ?All other systems reviewed and are negative. ? ?   ?Objective:  ? Physical Exam ?Vitals and nursing note reviewed.  ?Constitutional:   ?   Appearance: Normal appearance.  ?Cardiovascular:  ?   Rate and Rhythm: Normal rate.  ?   Pulses: Normal pulses.  ?   Heart sounds: Normal heart sounds.  ?Pulmonary:  ?   Effort:  Pulmonary effort is normal.  ?   Breath sounds: Normal breath sounds.  ?Musculoskeletal:  ?   Cervical back: Normal range of motion and neck supple.  ?   Comments: Normal Muscle Bulk and Muscle Testing Reveals:  ?Upper Extremities: Decreased ROM 90 Degrees and Muscle Strength 5/5 ? Thoracic Paraspinal Tenderness: T-7-T-9 ?Lumbar Paraspinal Tenderness: L-4-L-5 ?Lower Extremities: Full ROM and Muscle Strength 5/5 ?Arises from Table slowly using cane for support ?Narrow Based Gait  ?   ?Skin: ?   General: Skin is warm and dry.  ?Neurological:  ?   Mental Status: He is alert and oriented to person, place, and time.  ?Psychiatric:     ?   Mood and Affect: Mood normal.     ?   Behavior: Behavior normal.  ? ? ? ? ?   ?Assessment & Plan:  ?1. Lumbar postlaminectomy syndrome status post lumbar fusion x3: His most recent surgery was 03/14/2013. He had hardware removal and bone allograft waist and L2-3.  Continue to Monitor. Continue current Medication Regimen.05/10/2021 ?Refilled: oxyCODONE 7.5/'325mg'$  one tablet every 4 hours as needed #100 and :Oxycontin 20 mg one tablet every 12 hours #60. ?We will continue the opioid monitoring program, this consists of regular clinic visits, examinations, urine drug screen, pill counts as well as use of [place] Controlled Substance Reporting system. A 12 month History has been reviewed on the [place] Controlled Substance Reporting System on 05/10/2021.  ?2. Lumbar Radiculopathy: Continue current medication regimen with Lyrica. 05/10/2021 ?3. Insomnia: Continue current medication regimen with Trazodone.05/10/2021 ?4. Nausea: Continue Zofran as needed.Oncology Following.  05/10/2021 ?5. Cervical Spondylosis/ Cervicalgia/ Cervical Radiculitis:  No complaints today. Continue current medication regimen with  Lyrica. 05/10/2021 ?6. Cervical Myofascial Pain Syndrome: Continue current treatment regimen. Continue to monitor. 04/010/2023. ?7. Paresthesia: Continue Lyrica: Continue to  Monitor.  05/10/2021. ?  ?F/U in 1 month ? ?

## 2021-05-15 ENCOUNTER — Other Ambulatory Visit: Payer: Self-pay | Admitting: Hematology & Oncology

## 2021-05-17 ENCOUNTER — Encounter: Payer: Self-pay | Admitting: Hematology & Oncology

## 2021-06-03 ENCOUNTER — Encounter: Payer: Self-pay | Admitting: Registered Nurse

## 2021-06-03 ENCOUNTER — Encounter: Payer: Medicare HMO | Attending: Physical Medicine and Rehabilitation | Admitting: Registered Nurse

## 2021-06-03 VITALS — BP 99/64 | HR 81 | Ht 67.0 in | Wt 113.0 lb

## 2021-06-03 DIAGNOSIS — Z5181 Encounter for therapeutic drug level monitoring: Secondary | ICD-10-CM | POA: Diagnosis present

## 2021-06-03 DIAGNOSIS — M546 Pain in thoracic spine: Secondary | ICD-10-CM | POA: Insufficient documentation

## 2021-06-03 DIAGNOSIS — G894 Chronic pain syndrome: Secondary | ICD-10-CM | POA: Diagnosis present

## 2021-06-03 DIAGNOSIS — Z9889 Other specified postprocedural states: Secondary | ICD-10-CM | POA: Diagnosis present

## 2021-06-03 DIAGNOSIS — R0902 Hypoxemia: Secondary | ICD-10-CM | POA: Diagnosis present

## 2021-06-03 DIAGNOSIS — R202 Paresthesia of skin: Secondary | ICD-10-CM | POA: Diagnosis present

## 2021-06-03 DIAGNOSIS — G8929 Other chronic pain: Secondary | ICD-10-CM | POA: Insufficient documentation

## 2021-06-03 DIAGNOSIS — M961 Postlaminectomy syndrome, not elsewhere classified: Secondary | ICD-10-CM | POA: Diagnosis present

## 2021-06-03 DIAGNOSIS — Z79891 Long term (current) use of opiate analgesic: Secondary | ICD-10-CM | POA: Diagnosis present

## 2021-06-03 MED ORDER — OXYCODONE HCL ER 20 MG PO T12A: 20.0000 mg | EXTENDED_RELEASE_TABLET | Freq: Two times a day (BID) | ORAL | 0 refills | Status: DC

## 2021-06-03 MED ORDER — OXYCODONE-ACETAMINOPHEN 7.5-325 MG PO TABS: 1.0000 | ORAL_TABLET | ORAL | 0 refills | Status: DC | PRN

## 2021-06-03 MED ORDER — PREGABALIN 100 MG PO CAPS: 100.0000 mg | ORAL_CAPSULE | Freq: Three times a day (TID) | ORAL | 5 refills | Status: DC

## 2021-06-03 NOTE — Progress Notes (Signed)
? ?Subjective:  ? ? Patient ID: [name], male    DOB: [birth year], 66 y.o.   MRN: [id number] ? ?HPI: Bruce Olson is a 66 y.o. male who returns for follow up appointment for chronic pain and medication refill. He states his  pain is located in his mid- lower bavk and right lower extremity with numbness and tingling. He rates his pain 7. His current exercise regime is walking and performing stretching exercises. ? ?[name] arrived with oxygen desaturation, O2 saturation checked. [name] stets he return his Oxygen without doctors orders. He was instructed to call and scheduled an appointment with Pulmonologist and keep oxygen saturation log, he verbalizes understanding. He denies any SOB.  ?  ?Mr. Foot Morphine equivalent is 105.00  MME.   Oral Swab was Ordered today.  ? ?Pain Inventory ?Average Pain 7 ?Pain Right Now 7 ?My pain is sharp, stabbing, and tingling ? ?In the last 24 hours, has pain interfered with the following? ?General activity 7 ?Relation with others 7 ?Enjoyment of life 7 ?What TIME of day is your pain at its worst? morning , daytime, evening, and night ?Sleep (in general) Poor ? ?Pain is worse with: walking, sitting, and standing ?Pain improves with: heat/ice, medication, and TENS ?Relief from Meds: 3 ? ?Family History  ?Problem Relation Age of Onset  ? Anesthesia problems Neg Hx   ? Hypotension Neg Hx   ? Malignant hyperthermia Neg Hx   ? Pseudochol deficiency Neg Hx   ? ?Social History  ? ?Socioeconomic History  ? Marital status: Single  ?  Spouse name: Not on file  ? Number of children: Not on file  ? Years of education: Not on file  ? Highest education level: Not on file  ?Occupational History  ? Not on file  ?Tobacco Use  ? Smoking status: Every Day  ?  Packs/day: 1.00  ?  Years: 33.00  ?  Pack years: 33.00  ?  Types: Cigarettes  ? Smokeless tobacco: Never  ?Vaping Use  ? Vaping Use: Never used  ?Substance and Sexual Activity  ? Alcohol use: No  ? Drug use: No  ?  Sexual activity: Never  ?  Comment: back problems  ?Other Topics Concern  ? Not on file  ?Social History Narrative  ? Not on file  ? ?Social Determinants of Health  ? ?Financial Resource Strain: Not on file  ?Food Insecurity: Not on file  ?Transportation Needs: Not on file  ?Physical Activity: Not on file  ?Stress: Not on file  ?Social Connections: Not on file  ? ?Past Surgical History:  ?Procedure Laterality Date  ? BACK SURGERY  2013  ? Back surgery  (fusion)09/15/2010 & 2013  ? BACK SURGERY  03-2013  ? HARDWARE REMOVAL N/A 03/14/2013  ? Procedure: HARDWARE REMOVAL;  Surgeon: Eustace Moore, MD;  Location: [place];  Service: Neurosurgery;  Laterality: N/A;  ? ?Past Surgical History:  ?Procedure Laterality Date  ? BACK SURGERY  2013  ? Back surgery  (fusion)09/15/2010 & 2013  ? BACK SURGERY  03-2013  ? HARDWARE REMOVAL N/A 03/14/2013  ? Procedure: HARDWARE REMOVAL;  Surgeon: Eustace Moore, MD;  Location: [place] [facility];  Service: Neurosurgery;  Laterality: N/A;  ? ?Past Medical History:  ?Diagnosis Date  ? Goals of care, counseling/discussion 04/07/2016  ? Iron deficiency anemia due to chronic blood loss 10/21/2016  ? 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 No pertinent past medical history   ? Occupational injury 2013  ?  resulted in back injury that led to surgery  ? Thyroid cancer ([place])   ? ?BP 99/64   Pulse 81   Ht '5\' 7"'$  (1.702 m)   Wt 113 lb (51.3 kg)   SpO2 (!) 89%   BMI 17.70 kg/m?  ? ?Opioid Risk Score:   ?Fall Risk Score:  `1 ? ?Depression screen PHQ 2/9 ? ? ?  06/03/2021  ?  1:02 PM 05/10/2021  ?  1:11 PM 04/06/2021  ? 12:51 PM 03/12/2021  ?  1:00 PM 01/11/2021  ?  1:23 PM 12/11/2020  ?  1:02 PM 11/11/2020  ? 11:11 AM  ?Depression screen PHQ 2/9  ?Decreased Interest 0 0 0 0 0 0 0  ?Down, Depressed, Hopeless 0 0 0 0 0 0 0  ?PHQ - 2 Score 0 0 0 0 0 0 0  ?  ? ?Review of Systems  ?Constitutional: Negative.   ?HENT: Negative.    ?Eyes: Negative.   ?Respiratory: Negative.    ?Cardiovascular: Negative.   ?Gastrointestinal:  Negative.   ?Endocrine: Negative.   ?Genitourinary: Negative.   ?Musculoskeletal:  Positive for back pain and gait problem.  ?Skin: Negative.   ?Allergic/Immunologic: Negative.   ?Hematological: Negative.   ?Psychiatric/Behavioral:  Positive for sleep disturbance.   ? ?   ?Objective:  ? Physical Exam ?Vitals and nursing note reviewed.  ?Constitutional:   ?   Appearance: Normal appearance.  ?Cardiovascular:  ?   Rate and Rhythm: Normal rate and regular rhythm.  ?   Pulses: Normal pulses.  ?   Heart sounds: Normal heart sounds.  ?Pulmonary:  ?   Effort: Pulmonary effort is normal.  ?   Breath sounds: Normal breath sounds.  ?Musculoskeletal:  ?   Cervical back: Normal range of motion and neck supple.  ?   Comments: Normal Muscle Bulk and Muscle Testing Reveals:  ?Upper Extremities: Full ROM and Muscle Strength 5/5 ?Thoracic and Lumbar Hypersensitivity ?Lower Extremities: Full ROM and Muscle Strength 5/5 ?Arises from Table Slowly using cane for support ?Narrow Based Gait  ?   ?Skin: ?   General: Skin is warm and dry.  ?Neurological:  ?   Mental Status: He is alert and oriented to person, place, and time.  ?Psychiatric:     ?   Mood and Affect: Mood normal.     ?   Behavior: Behavior normal.  ? ? ? ? ?   ?Assessment & Plan:  ?1. Lumbar postlaminectomy syndrome status post lumbar fusion x3: His most recent surgery was 03/14/2013. He had hardware removal and bone allograft waist and L2-3.  Continue to Monitor. Continue current Medication Regimen.06/03/2021 ?Refilled: oxyCODONE 7.5/'325mg'$  one tablet every 4 hours as needed #100 and :Oxycontin 20 mg one tablet every 12 hours #60. ?We will continue the opioid monitoring program, this consists of regular clinic visits, examinations, urine drug screen, pill counts as well as use of [place] Controlled Substance Reporting system. A 12 month History has been reviewed on the [place] Controlled Substance Reporting System on 06/03/2021.  ?2. Lumbar Radiculopathy: Continue  current medication regimen with Lyrica. 06/03/2021 ?3. Insomnia: Continue current medication regimen with Trazodone.06/03/2021 ?4. Nausea: Continue Zofran as needed.Oncology Following.  06/03/2021 ?5. Cervical Spondylosis/ Cervicalgia/ Cervical Radiculitis:  No complaints today. Continue current medication regimen with  Lyrica. 06/03/2021 ?6. Cervical Myofascial Pain Syndrome: Continue current treatment regimen. Continue to monitor. 06/03/2021. ?7. Paresthesia: Continue Lyrica: Continue to Monitor.  06/03/2021. ?8. Oxygen Desaturation. O2 saturation re- checked. He will call his  Pulmonologist to schedule an appointment. He was instructed to keep his oxygen  saturation log, he verbalizes understanding.  ?  ?F/U in 1 month ?  ? ? ? ? ? ? ? ? ? ? ?

## 2021-06-14 ENCOUNTER — Inpatient Hospital Stay: Payer: Medicare HMO | Admitting: Hematology & Oncology

## 2021-06-14 ENCOUNTER — Inpatient Hospital Stay: Payer: Medicare HMO

## 2021-06-25 ENCOUNTER — Other Ambulatory Visit: Payer: Self-pay | Admitting: Hematology & Oncology

## 2021-06-29 ENCOUNTER — Other Ambulatory Visit: Payer: Self-pay | Admitting: *Deleted

## 2021-06-29 DIAGNOSIS — C090 Malignant neoplasm of tonsillar fossa: Secondary | ICD-10-CM

## 2021-06-30 ENCOUNTER — Telehealth: Payer: Self-pay | Admitting: *Deleted

## 2021-06-30 ENCOUNTER — Inpatient Hospital Stay: Payer: Medicare HMO | Attending: Hematology & Oncology

## 2021-06-30 ENCOUNTER — Encounter: Payer: Self-pay | Admitting: Hematology & Oncology

## 2021-06-30 ENCOUNTER — Inpatient Hospital Stay (HOSPITAL_BASED_OUTPATIENT_CLINIC_OR_DEPARTMENT_OTHER): Payer: Medicare HMO | Admitting: Hematology & Oncology

## 2021-06-30 VITALS — BP 160/69 | HR 81 | Temp 98.4°F | Resp 18 | Wt 113.1 lb

## 2021-06-30 DIAGNOSIS — Z9221 Personal history of antineoplastic chemotherapy: Secondary | ICD-10-CM | POA: Insufficient documentation

## 2021-06-30 DIAGNOSIS — E611 Iron deficiency: Secondary | ICD-10-CM | POA: Diagnosis not present

## 2021-06-30 DIAGNOSIS — C090 Malignant neoplasm of tonsillar fossa: Secondary | ICD-10-CM

## 2021-06-30 DIAGNOSIS — Z85818 Personal history of malignant neoplasm of other sites of lip, oral cavity, and pharynx: Secondary | ICD-10-CM | POA: Diagnosis not present

## 2021-06-30 DIAGNOSIS — Z79899 Other long term (current) drug therapy: Secondary | ICD-10-CM | POA: Insufficient documentation

## 2021-06-30 DIAGNOSIS — F17210 Nicotine dependence, cigarettes, uncomplicated: Secondary | ICD-10-CM | POA: Insufficient documentation

## 2021-06-30 DIAGNOSIS — R1110 Vomiting, unspecified: Secondary | ICD-10-CM | POA: Insufficient documentation

## 2021-06-30 DIAGNOSIS — E039 Hypothyroidism, unspecified: Secondary | ICD-10-CM | POA: Insufficient documentation

## 2021-06-30 DIAGNOSIS — Z923 Personal history of irradiation: Secondary | ICD-10-CM | POA: Insufficient documentation

## 2021-06-30 LAB — CMP (CANCER CENTER ONLY)
ALT: 12 U/L (ref 0–44)
AST: 17 U/L (ref 15–41)
Albumin: 4.2 g/dL (ref 3.5–5.0)
Alkaline Phosphatase: 52 U/L (ref 38–126)
Anion gap: 5 (ref 5–15)
BUN: 17 mg/dL (ref 8–23)
CO2: 33 mmol/L — ABNORMAL HIGH (ref 22–32)
Calcium: 9.3 mg/dL (ref 8.9–10.3)
Chloride: 99 mmol/L (ref 98–111)
Creatinine: 0.94 mg/dL (ref 0.61–1.24)
GFR, Estimated: >60 mL/min (ref >60)
Glucose, Bld: 92 mg/dL (ref 70–99)
Potassium: 4.7 mmol/L (ref 3.5–5.1)
Sodium: 137 mmol/L (ref 135–145)
Total Bilirubin: 0.3 mg/dL (ref 0.3–1.2)
Total Protein: 6.6 g/dL (ref 6.5–8.1)

## 2021-06-30 LAB — CBC WITH DIFFERENTIAL (CANCER CENTER ONLY)
Abs Immature Granulocytes: 0.02 10*3/uL (ref 0.00–0.07)
Basophils Absolute: 0 10*3/uL (ref 0.0–0.1)
Basophils Relative: 1 %
Eosinophils Absolute: 0.1 10*3/uL (ref 0.0–0.5)
Eosinophils Relative: 2 %
HCT: 39.7 % (ref 39.0–52.0)
Hemoglobin: 13.3 g/dL (ref 13.0–17.0)
Immature Granulocytes: 0 %
Lymphocytes Relative: 23 %
Lymphs Abs: 1.5 10*3/uL (ref 0.7–4.0)
MCH: 30.6 pg (ref 26.0–34.0)
MCHC: 33.5 g/dL (ref 30.0–36.0)
MCV: 91.5 fL (ref 80.0–100.0)
Monocytes Absolute: 0.8 10*3/uL (ref 0.1–1.0)
Monocytes Relative: 12 %
Neutro Abs: 4.2 10*3/uL (ref 1.7–7.7)
Neutrophils Relative %: 62 %
Platelet Count: 178 10*3/uL (ref 150–400)
RBC: 4.34 MIL/uL (ref 4.22–5.81)
RDW: 12.4 % (ref 11.5–15.5)
WBC Count: 6.7 10*3/uL (ref 4.0–10.5)
nRBC: 0 % (ref 0.0–0.2)

## 2021-06-30 NOTE — Progress Notes (Signed)
Hematology and Oncology Follow Up Visit  Bruce Olson [id number] 12-26-1955 66 y.o. 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 is here today for follow-up.  We actually saw him a year ago.  Back when he was post to come in around Christmas, I think he was hospitalized at [facility] with pneumonia.  He said he was in for 4 days.  He said he lost 8 pounds.  It was not COVID.  He subsequently was on oxygen at home.  He did have a chest x-ray when he was in the hospital.  Everything looked fine without any obvious effusions or nodules or infiltrates.  He is taking Ensure to try to help gain weight.  He is still smoking.  I am not sure how much he really is smoking.  He says he still has vomiting.  He says he throws up when he does eat too much.  When we last saw him, his thyroid was still doing okay.  His TSH was 4.2.  A little bit of iron deficiency back in November.  We will have to see what his iron levels are.  He has had no problems with change in pain.  He is on OxyContin and Percocet.  There has been no problems with bleeding.  He has had no leg swelling.  He has had no obvious change in bowel or bladder habits.  Overall, his performance status is ECOG 1.   Medications:  Allergies as of 06/30/2021   No Known Allergies      Medication List        Accurate as of Jun 30, 2021  3:34 PM. If you have any questions, ask your nurse or doctor.          albuterol 108 (90 Base) MCG/ACT inhaler Commonly known as: VENTOLIN HFA Inhale into the lungs.   docusate sodium 100 MG capsule Commonly known as: COLACE Take 100 mg by mouth daily.   dronabinol 2.5 MG capsule Commonly known as: MARINOL Take 1 capsule (2.5 mg total) by mouth 2 (two) times daily before  lunch and supper.   guaiFENesin 600 MG 12 hr tablet Commonly known as: MUCINEX Take by mouth.   levothyroxine 50 MCG tablet Commonly known as: SYNTHROID TAKE 1 TABLET BY MOUTH EVERY MORNING ON AN EMPTY STOMACH   metoCLOPramide 10 MG tablet Commonly known as: REGLAN TAKE 1 TABLET BY MOUTH 4 TIMES DAILY   multivitamin with minerals Tabs tablet Take 1 tablet by mouth daily.   Narcan 4 MG/0.1ML Liqd nasal spray kit Generic drug: naloxone   OLANZapine 10 MG tablet Commonly known as: ZYPREXA TAKE 1 TABLET BY MOUTH EVERY NIGHT AT BEDTIME   oxyCODONE 20 mg 12 hr tablet Commonly known as: OxyCONTIN Take 1 tablet (20 mg total) by mouth every 12 (twelve) hours.   oxyCODONE-acetaminophen 7.5-325 MG tablet Commonly known as: Percocet Take 1 tablet by mouth every 4 (four) hours as needed. May take one extra tablet when pain is sever. No more than 4 a day.   pilocarpine 5 MG tablet Commonly known as: SALAGEN Take 1 tablet by mouth 2 (two) times daily.   pregabalin 100 MG capsule Commonly known as: LYRICA Take 1 capsule (100 mg total) by mouth 3 (three) times daily.  Allergies: No Known Allergies  Past Medical History, Surgical history, Social history, and Family History were reviewed and updated.  Review of Systems: Review of Systems  Constitutional:  Positive for malaise/fatigue.  HENT: Negative.    Eyes: Negative.   Respiratory: Negative.    Cardiovascular: Negative.   Gastrointestinal:  Positive for nausea and vomiting.  Genitourinary: Negative.   Musculoskeletal:  Positive for back pain.  Skin: Negative.   Neurological: Negative.   Endo/Heme/Allergies: Negative.   Psychiatric/Behavioral: Negative.      Physical Exam:  weight is 113 lb 1.9 oz (51.3 kg). His oral temperature is 98.4 F (36.9 C). His blood pressure is 160/69 (abnormal) and his pulse is 81. His respiration is 18 and oxygen saturation is 94%.   Wt Readings from Last 3 Encounters:  06/30/21  113 lb 1.9 oz (51.3 kg)  06/03/21 113 lb (51.3 kg)  05/10/21 115 lb (52.2 kg)    Physical Activity: Not on file   Physical Exam Vitals reviewed.  HENT:     Head: Normocephalic and atraumatic.  Eyes:     Pupils: Pupils are equal, round, and reactive to light.  Cardiovascular:     Rate and Rhythm: Normal rate and regular rhythm.     Heart sounds: Normal heart sounds.  Pulmonary:     Effort: Pulmonary effort is normal.     Breath sounds: Normal breath sounds.  Abdominal:     General: Bowel sounds are normal.     Palpations: Abdomen is soft.  Musculoskeletal:        General: No tenderness or deformity. Normal range of motion.     Cervical back: Normal range of motion.  Lymphadenopathy:     Cervical: No cervical adenopathy.  Skin:    General: Skin is warm and dry.     Findings: No erythema or rash.  Neurological:     Mental Status: He is alert and oriented to person, place, and time.  Psychiatric:        Behavior: Behavior normal.        Thought Content: Thought content normal.        Judgment: Judgment normal.      Lab Results  Component Value Date   WBC 6.7 06/30/2021   HGB 13.3 06/30/2021   HCT 39.7 06/30/2021   MCV 91.5 06/30/2021   PLT 178 06/30/2021   Lab Results  Component Value Date   FERRITIN 418 (H) 12/14/2020   IRON 39 (L) 12/14/2020   TIBC 269 12/14/2020   UIBC 230 12/14/2020   IRONPCTSAT 14 (L) 12/14/2020   Lab Results  Component Value Date   RETICCTPCT 1.7 06/12/2020   RBC 4.34 06/30/2021   No results found for: KPAFRELGTCHN, LAMBDASER, KAPLAMBRATIO No results found for: IGGSERUM, IGA, IGMSERUM No results found for: Odetta Pink, SPEI   Chemistry      Component Value Date/Time   NA 137 06/30/2021 1458   NA 138 01/20/2017 1324   NA 135 (L) 04/05/2016 1333   K 4.7 06/30/2021 1458   K 4.5 01/20/2017 1324   K 4.7 04/05/2016 1333   CL 99 06/30/2021 1458   CL 97 (L) 01/20/2017 1324   CO2  33 (H) 06/30/2021 1458   CO2 30 01/20/2017 1324   CO2 29 04/05/2016 1333   BUN 17 06/30/2021 1458   BUN 15 01/20/2017 1324   BUN 23.6 04/05/2016 1333   CREATININE 0.94 06/30/2021 1458   CREATININE 1.2 01/20/2017 1324  CREATININE 1.1 04/05/2016 1333      Component Value Date/Time   CALCIUM 9.3 06/30/2021 1458   CALCIUM 9.2 01/20/2017 1324   CALCIUM 9.2 04/05/2016 1333   ALKPHOS 52 06/30/2021 1458   ALKPHOS 51 01/20/2017 1324   ALKPHOS 67 04/05/2016 1333   AST 17 06/30/2021 1458   AST 17 04/05/2016 1333   ALT 12 06/30/2021 1458   ALT 26 01/20/2017 1324   ALT 10 04/05/2016 1333   BILITOT 0.3 06/30/2021 1458   BILITOT 0.30 04/05/2016 1333       Impression and Plan: Bruce Olson is a very pleasant 66 yo caucasian gentleman with history of squamous cell carcinoma of the left tonsil.  Thankfully, the tumor is HPV positive.   He completed chemo/radiation therapy with [facility] in August 2017.  Thankfully, I do not see any evidence of recurrence of his malignancy.  I really have to believe that he is cured.  I had the fact that he had this pneumonia.  Again, we are going to have to see about his thyroid.  I am sure that this will, eventually, we are out and he will need to be on Synthroid.  We will try to get him back in 6 more months.   [name], MD 5/31/20233:34 PM

## 2021-06-30 NOTE — Telephone Encounter (Signed)
Per 06/30/21 los - gave upcoming appointments - confirmed

## 2021-07-05 ENCOUNTER — Encounter: Payer: Self-pay | Admitting: Registered Nurse

## 2021-07-05 ENCOUNTER — Encounter: Payer: Medicare HMO | Attending: Physical Medicine and Rehabilitation | Admitting: Registered Nurse

## 2021-07-05 VITALS — BP 100/58 | HR 71 | Ht 65.0 in | Wt 113.0 lb

## 2021-07-05 DIAGNOSIS — Z5181 Encounter for therapeutic drug level monitoring: Secondary | ICD-10-CM | POA: Diagnosis present

## 2021-07-05 DIAGNOSIS — R202 Paresthesia of skin: Secondary | ICD-10-CM | POA: Insufficient documentation

## 2021-07-05 DIAGNOSIS — G894 Chronic pain syndrome: Secondary | ICD-10-CM | POA: Insufficient documentation

## 2021-07-05 DIAGNOSIS — M546 Pain in thoracic spine: Secondary | ICD-10-CM | POA: Insufficient documentation

## 2021-07-05 DIAGNOSIS — Z79891 Long term (current) use of opiate analgesic: Secondary | ICD-10-CM | POA: Insufficient documentation

## 2021-07-05 DIAGNOSIS — Z9889 Other specified postprocedural states: Secondary | ICD-10-CM | POA: Diagnosis present

## 2021-07-05 DIAGNOSIS — M961 Postlaminectomy syndrome, not elsewhere classified: Secondary | ICD-10-CM | POA: Diagnosis present

## 2021-07-05 DIAGNOSIS — G8929 Other chronic pain: Secondary | ICD-10-CM | POA: Diagnosis present

## 2021-07-05 DIAGNOSIS — R0902 Hypoxemia: Secondary | ICD-10-CM | POA: Insufficient documentation

## 2021-07-05 MED ORDER — OXYCODONE-ACETAMINOPHEN 7.5-325 MG PO TABS: 1.0000 | ORAL_TABLET | ORAL | 0 refills | Status: DC | PRN

## 2021-07-05 MED ORDER — OXYCODONE HCL ER 20 MG PO T12A: 20.0000 mg | EXTENDED_RELEASE_TABLET | Freq: Two times a day (BID) | ORAL | 0 refills | Status: DC

## 2021-07-05 NOTE — Addendum Note (Signed)
Addended by: Caro Hight on: 07/05/2021 02:45 PM   Modules accepted: Orders

## 2021-07-05 NOTE — Progress Notes (Signed)
Subjective:    Patient ID: Bruce Olson, male    DOB: [birth year], 66 y.o.   MRN: [id number]  HPI: Bruce Olson is a 66 y.o. male who returns for follow up appointment for chronic pain and medication refill. He states his pain is located in his mid- lower back pain also reports right lower extremity numbness. He rates his pain 7. His current exercise regime is walking and performing stretching exercises.  Bruce Olson arrived with oxygen desaturation, O2 sat was re-checked. He refuses ED or Urgent Cre evalution. Bruce Olson states he called his oxygen company and returned  his Oxygen tanks, since he wasn't using them, it was not a dr's order. He was instructed to call and speak with his PCP regarding the above, he verbalizes understanding.   Bruce Olson Morphine equivalent is 101.50 MME.  UDS ordered today.    Pain Inventory Average Pain 7 Pain Right Now 7 My pain is sharp, stabbing, and tingling  In the last 24 hours, has pain interfered with the following? General activity 7 Relation with others 7 Enjoyment of life 7 What TIME of day is your pain at its worst? morning , daytime, evening, and night Sleep (in general) Poor  Pain is worse with: walking, sitting, and standing Pain improves with: heat/ice, medication, and TENS Relief from Meds: 3  Family History  Problem Relation Age of Onset   Anesthesia problems Neg Hx    Hypotension Neg Hx    Malignant hyperthermia Neg Hx    Pseudochol deficiency Neg Hx    Social History   Socioeconomic History   Marital status: Single    Spouse name: Not on file   Number of children: Not on file   Years of education: Not on file   Highest education level: Not on file  Occupational History   Not on file  Tobacco Use   Smoking status: Every Day    Packs/day: 1.00    Years: 33.00    Pack years: 33.00    Types: Cigarettes   Smokeless tobacco: Never  Vaping Use   Vaping Use: Never used  Substance and Sexual  Activity   Alcohol use: No   Drug use: No   Sexual activity: Never    Comment: back problems  Other Topics Concern   Not on file  Social History Narrative   Not on file   Social Determinants of Health   Financial Resource Strain: Not on file  Food Insecurity: Not on file  Transportation Needs: Not on file  Physical Activity: Not on file  Stress: Not on file  Social Connections: Not on file   Past Surgical History:  Procedure Laterality Date   BACK SURGERY  2013   Back surgery  (fusion)09/15/2010 & 2013   BACK SURGERY  03-2013   HARDWARE REMOVAL N/A 03/14/2013   Procedure: HARDWARE REMOVAL;  Surgeon: Eustace Moore, MD;  Location: [facility];  Service: Neurosurgery;  Laterality: N/A;   Past Surgical History:  Procedure Laterality Date   BACK SURGERY  2013   Back surgery  (fusion)09/15/2010 & 2013   BACK SURGERY  03-2013   HARDWARE REMOVAL N/A 03/14/2013   Procedure: HARDWARE REMOVAL;  Surgeon: Eustace Moore, MD;  Location: [place] [facility];  Service: Neurosurgery;  Laterality: N/A;   Past Medical History:  Diagnosis Date   Goals of care, counseling/discussion 04/07/2016   Iron deficiency anemia due to chronic blood loss 10/21/2016   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   No  pertinent past medical history    Occupational injury 2013   resulted in back injury that led to surgery   Thyroid cancer ([place])    BP (!) 100/58   Pulse 71   Ht '5\' 5"'$  (1.651 m)   Wt 113 lb (51.3 kg)   SpO2 (!) 87%   BMI 18.80 kg/m   Opioid Risk Score:   Fall Risk Score:  `1  Depression screen PHQ 2/9     06/03/2021    1:02 PM 05/10/2021    1:11 PM 04/06/2021   12:51 PM 03/12/2021    1:00 PM 01/11/2021    1:23 PM 12/11/2020    1:02 PM 11/11/2020   11:11 AM  Depression screen PHQ 2/9  Decreased Interest 0 0 0 0 0 0 0  Down, Depressed, Hopeless 0 0 0 0 0 0 0  PHQ - 2 Score 0 0 0 0 0 0 0      Review of Systems  Musculoskeletal:  Positive for back pain.       Right thigh pain  All other systems  reviewed and are negative.     Objective:   Physical Exam Vitals and nursing note reviewed.  Constitutional:      Appearance: Normal appearance.  Cardiovascular:     Rate and Rhythm: Normal rate and regular rhythm.     Pulses: Normal pulses.     Heart sounds: Normal heart sounds.  Pulmonary:     Effort: Pulmonary effort is normal.     Breath sounds: Normal breath sounds.  Musculoskeletal:     Cervical back: Normal range of motion and neck supple.     Comments: Normal Muscle Bulk and Muscle Testing Reveals:  Upper Extremities: Full ROM and Muscle Strength 5/5 Thoracic and Lumbar Hypersensitivity Lower Extremities: Full ROM and Muscle Strength 5/5 Arises from Table Slowly using cane for support Antalgic  Gait     Skin:    General: Skin is warm and dry.  Neurological:     Mental Status: He is alert and oriented to person, place, and time.  Psychiatric:        Mood and Affect: Mood normal.        Behavior: Behavior normal.         Assessment & Plan:  1. Lumbar postlaminectomy syndrome status post lumbar fusion x3: His most recent surgery was 03/14/2013. He had hardware removal and bone allograft waist and L2-3.  Continue to Monitor. Continue current Medication Regimen.07/05/2021 Refilled: oxyCODONE 7.5/'325mg'$  one tablet every 4 hours as needed #100 and :Oxycontin 20 mg one tablet every 12 hours #60. We will continue the opioid monitoring program, this consists of regular clinic visits, examinations, urine drug screen, pill counts as well as use of [place] Controlled Substance Reporting system. A 12 month History has been reviewed on the [place] on 07/05/2021.  2. Lumbar Radiculopathy: Continue current medication regimen with Lyrica. 07/05/2021 3. Insomnia: Continue current medication regimen with Trazodone.07/05/2021 4. Nausea: Continue Zofran as needed.Oncology Following.  07/05/2021 5. Cervical Spondylosis/ Cervicalgia/  Cervical Radiculitis:  No complaints today. Continue current medication regimen with  Lyrica. 07/05/2021 6. Cervical Myofascial Pain Syndrome: Continue current treatment regimen. Continue to monitor. 07/05/2021. 7. Paresthesia: Continue Lyrica: Continue to Monitor.  07/05/2021. 8. Oxygen Desaturation. O2 saturation re- checked. He will call his PCP to schedule an appointment. He was instructed to keep his oxygen  saturation log, he verbalizes understanding.    F/U in 1 month

## 2021-07-09 ENCOUNTER — Telehealth: Payer: Self-pay | Admitting: *Deleted

## 2021-07-09 LAB — DRUG TOX MONITOR 1 W/CONF, ORAL FLD
Amphetamines: NEGATIVE ng/mL (ref <10)
Barbiturates: NEGATIVE ng/mL (ref <10)
Benzodiazepines: NEGATIVE ng/mL (ref <0.50)
Buprenorphine: NEGATIVE ng/mL (ref <0.10)
Cocaine: NEGATIVE ng/mL (ref <5.0)
Codeine: NEGATIVE ng/mL (ref <2.5)
Cotinine: 28.5 ng/mL — ABNORMAL HIGH (ref <5.0)
Dihydrocodeine: NEGATIVE ng/mL (ref <2.5)
Fentanyl: NEGATIVE ng/mL (ref <0.10)
Heroin Metabolite: NEGATIVE ng/mL (ref <1.0)
Hydrocodone: NEGATIVE ng/mL (ref <2.5)
Hydromorphone: NEGATIVE ng/mL (ref <2.5)
MARIJUANA: NEGATIVE ng/mL (ref <2.5)
MDMA: NEGATIVE ng/mL (ref <10)
Meprobamate: NEGATIVE ng/mL (ref <2.5)
Methadone: NEGATIVE ng/mL (ref <5.0)
Morphine: NEGATIVE ng/mL (ref <2.5)
Nicotine Metabolite: POSITIVE ng/mL — AB (ref <5.0)
Norhydrocodone: NEGATIVE ng/mL (ref <2.5)
Noroxycodone: 28.6 ng/mL — ABNORMAL HIGH (ref <2.5)
Opiates: POSITIVE ng/mL — AB (ref <2.5)
Oxycodone: 93.6 ng/mL — ABNORMAL HIGH (ref <2.5)
Oxymorphone: NEGATIVE ng/mL (ref <2.5)
Phencyclidine: NEGATIVE ng/mL (ref <10)
Tapentadol: NEGATIVE ng/mL (ref <5.0)
Tramadol: NEGATIVE ng/mL (ref <5.0)
Zolpidem: NEGATIVE ng/mL (ref <5.0)

## 2021-07-09 LAB — DRUG TOX ALC METAB W/CON, ORAL FLD: Alcohol Metabolite: NEGATIVE ng/mL (ref <25)

## 2021-07-09 NOTE — Telephone Encounter (Signed)
Oral swab drug screen was consistent for prescribed medications.  ?

## 2021-07-16 ENCOUNTER — Other Ambulatory Visit: Payer: Self-pay | Admitting: Hematology & Oncology

## 2021-07-16 DIAGNOSIS — C090 Malignant neoplasm of tonsillar fossa: Secondary | ICD-10-CM

## 2021-08-05 ENCOUNTER — Encounter: Payer: Medicare HMO | Attending: Physical Medicine and Rehabilitation | Admitting: Registered Nurse

## 2021-08-05 VITALS — BP 117/67 | HR 90 | Ht 65.0 in | Wt 112.2 lb

## 2021-08-05 DIAGNOSIS — Z5181 Encounter for therapeutic drug level monitoring: Secondary | ICD-10-CM | POA: Diagnosis present

## 2021-08-05 DIAGNOSIS — G8929 Other chronic pain: Secondary | ICD-10-CM | POA: Diagnosis present

## 2021-08-05 DIAGNOSIS — G894 Chronic pain syndrome: Secondary | ICD-10-CM

## 2021-08-05 DIAGNOSIS — R202 Paresthesia of skin: Secondary | ICD-10-CM | POA: Diagnosis not present

## 2021-08-05 DIAGNOSIS — M961 Postlaminectomy syndrome, not elsewhere classified: Secondary | ICD-10-CM | POA: Diagnosis not present

## 2021-08-05 DIAGNOSIS — Z9889 Other specified postprocedural states: Secondary | ICD-10-CM

## 2021-08-05 DIAGNOSIS — M546 Pain in thoracic spine: Secondary | ICD-10-CM | POA: Diagnosis not present

## 2021-08-05 DIAGNOSIS — Z79891 Long term (current) use of opiate analgesic: Secondary | ICD-10-CM | POA: Diagnosis present

## 2021-08-05 MED ORDER — OXYCODONE HCL ER 20 MG PO T12A: 20.0000 mg | EXTENDED_RELEASE_TABLET | Freq: Two times a day (BID) | ORAL | 0 refills | Status: DC

## 2021-08-05 MED ORDER — OXYCODONE-ACETAMINOPHEN 7.5-325 MG PO TABS: 1.0000 | ORAL_TABLET | ORAL | 0 refills | Status: DC | PRN

## 2021-08-05 NOTE — Progress Notes (Signed)
Subjective:    Patient ID: Bruce Olson, male    DOB: 1956/01/27, 66 y.o.   MRN: [id number]  HPI: Bruce Olson is a 66 y.o. male who returns for follow up appointment for chronic pain and medication refill. He states his  pain is located in his  mid- lower back and left lower extremity numbness. He rates his pain 7. His current exercise regime is walking and performing stretching exercises.  Bruce Olson Morphine equivalent is 90.50 MME.   Last Oral Swab was Performed on 07/05/2021, it was consistent.     Pain Inventory Average Pain 7 Pain Right Now 7 My pain is sharp, stabbing, and tingling  In the last 24 hours, has pain interfered with the following? General activity 7 Relation with others 7 Enjoyment of life 7 What TIME of day is your pain at its worst? morning , daytime, evening, and night Sleep (in general) Poor  Pain is worse with: walking, sitting, and standing Pain improves with: heat/ice, medication, and TENS Relief from Meds: 3  Family History  Problem Relation Age of Onset   Anesthesia problems Neg Hx    Hypotension Neg Hx    Malignant hyperthermia Neg Hx    Pseudochol deficiency Neg Hx    Social History   Socioeconomic History   Marital status: Single    Spouse name: Not on file   Number of children: Not on file   Years of education: Not on file   Highest education level: Not on file  Occupational History   Not on file  Tobacco Use   Smoking status: Every Day    Packs/day: 1.00    Years: 33.00    Total pack years: 33.00    Types: Cigarettes   Smokeless tobacco: Never  Vaping Use   Vaping Use: Never used  Substance and Sexual Activity   Alcohol use: No   Drug use: No   Sexual activity: Never    Comment: back problems  Other Topics Concern   Not on file  Social History Narrative   Not on file   Social Determinants of Health   Financial Resource Strain: Not on file  Food Insecurity: Not on file  Transportation Needs: Not on  file  Physical Activity: Not on file  Stress: Not on file  Social Connections: Not on file   Past Surgical History:  Procedure Laterality Date   BACK SURGERY  2013   Back surgery  (fusion)09/15/2010 & 2013   BACK SURGERY  03-2013   HARDWARE REMOVAL N/A 03/14/2013   Procedure: HARDWARE REMOVAL;  Surgeon: Eustace Moore, MD;  Location: [facility];  Service: Neurosurgery;  Laterality: N/A;   Past Surgical History:  Procedure Laterality Date   BACK SURGERY  2013   Back surgery  (fusion)09/15/2010 & 2013   BACK SURGERY  03-2013   HARDWARE REMOVAL N/A 03/14/2013   Procedure: HARDWARE REMOVAL;  Surgeon: Eustace Moore, MD;  Location: [facility];  Service: Neurosurgery;  Laterality: N/A;   Past Medical History:  Diagnosis Date   Goals of care, counseling/discussion 04/07/2016   Iron deficiency anemia due to chronic blood loss 10/21/2016   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   No pertinent past medical history    Occupational injury 2013   resulted in back injury that led to surgery   Thyroid cancer ([place])    BP 117/67   Pulse 90   Ht '5\' 5"'$  (1.651 m)   Wt 112 lb 3.2 oz (50.9 kg)  SpO2 (!) 89%   BMI 18.67 kg/m   Opioid Risk Score:   Fall Risk Score:  `1  Depression screen PHQ 2/9     08/05/2021    1:17 PM 06/03/2021    1:02 PM 05/10/2021    1:11 PM 04/06/2021   12:51 PM 03/12/2021    1:00 PM 01/11/2021    1:23 PM 12/11/2020    1:02 PM  Depression screen PHQ 2/9  Decreased Interest 0 0 0 0 0 0 0  Down, Depressed, Hopeless 0 0 0 0 0 0 0  PHQ - 2 Score 0 0 0 0 0 0 0      Review of Systems  Musculoskeletal:  Positive for back pain and gait problem.       Right thigh pain  All other systems reviewed and are negative.     Objective:   Physical Exam Vitals and nursing note reviewed.  Constitutional:      Appearance: Normal appearance.  Cardiovascular:     Rate and Rhythm: Normal rate and regular rhythm.     Pulses: Normal pulses.     Heart sounds: Normal heart sounds.  Pulmonary:      Effort: Pulmonary effort is normal.     Breath sounds: Normal breath sounds.  Musculoskeletal:     Cervical back: Normal range of motion and neck supple.     Comments: Normal Muscle Bulk and Muscle Testing Reveals:  Upper Extremities: Full ROM and Muscle Strength 5/5  Thoracic and Lumbar Hypersensitivity Lower Extremities: Full ROM and Muscle Strength 5/5 Arises from Table Slowly Using Cane for support Narrow Based  Gait     Skin:    General: Skin is warm and dry.  Neurological:     Mental Status: He is alert and oriented to person, place, and time.  Psychiatric:        Mood and Affect: Mood normal.        Behavior: Behavior normal.         Assessment & Plan:  1. Lumbar postlaminectomy syndrome status post lumbar fusion x3: His most recent surgery was 03/14/2013. He had hardware removal and bone allograft waist and L2-3.  Continue to Monitor. Continue current Medication Regimen.08/05/2021 Refilled: oxyCODONE 7.5/'325mg'$  one tablet every 4 hours as needed #100 and :Oxycontin 20 mg one tablet every 12 hours #60. We will continue the opioid monitoring program, this consists of regular clinic visits, examinations, urine drug screen, pill counts as well as use of [place] Controlled Substance Reporting system. A 12 month History has been reviewed on the [place] on 08/05/2021.  2. Lumbar Radiculopathy: Continue current medication regimen with Lyrica. 08/05/2021 3. Insomnia: Continue current medication regimen with Trazodone.08/05/2021 4. Nausea: Continue Zofran as needed.Oncology Following.  08/05/2021 5. Cervical Spondylosis/ Cervicalgia/ Cervical Radiculitis:  No complaints today. Continue current medication regimen with  Lyrica. 08/05/2021 6. Cervical Myofascial Pain Syndrome: Continue current treatment regimen. Continue to monitor. 08/05/2021. 7. Paresthesia: Continue Lyrica: Continue to Monitor.  08/05/2021. 8. Oxygen Desaturation.  O2 saturation re- checked. He will call his PCP to schedule an appointment. He was instructed to keep his oxygen  saturation log, he verbalizes understanding.    F/U in 1 month

## 2021-08-15 ENCOUNTER — Encounter: Payer: Self-pay | Admitting: Registered Nurse

## 2021-08-31 ENCOUNTER — Encounter: Payer: Self-pay | Admitting: Registered Nurse

## 2021-08-31 ENCOUNTER — Encounter: Payer: Medicare HMO | Attending: Physical Medicine and Rehabilitation | Admitting: Registered Nurse

## 2021-08-31 VITALS — BP 101/64 | HR 85 | Ht 65.0 in | Wt 115.0 lb

## 2021-08-31 DIAGNOSIS — G894 Chronic pain syndrome: Secondary | ICD-10-CM | POA: Insufficient documentation

## 2021-08-31 DIAGNOSIS — M961 Postlaminectomy syndrome, not elsewhere classified: Secondary | ICD-10-CM | POA: Insufficient documentation

## 2021-08-31 DIAGNOSIS — Z5181 Encounter for therapeutic drug level monitoring: Secondary | ICD-10-CM | POA: Diagnosis present

## 2021-08-31 DIAGNOSIS — Z9889 Other specified postprocedural states: Secondary | ICD-10-CM | POA: Diagnosis present

## 2021-08-31 DIAGNOSIS — G8929 Other chronic pain: Secondary | ICD-10-CM | POA: Diagnosis present

## 2021-08-31 DIAGNOSIS — Z79891 Long term (current) use of opiate analgesic: Secondary | ICD-10-CM | POA: Diagnosis present

## 2021-08-31 DIAGNOSIS — M545 Low back pain, unspecified: Secondary | ICD-10-CM | POA: Insufficient documentation

## 2021-08-31 DIAGNOSIS — R202 Paresthesia of skin: Secondary | ICD-10-CM | POA: Diagnosis present

## 2021-08-31 DIAGNOSIS — M546 Pain in thoracic spine: Secondary | ICD-10-CM | POA: Insufficient documentation

## 2021-08-31 MED ORDER — OXYCODONE-ACETAMINOPHEN 7.5-325 MG PO TABS: 1.0000 | ORAL_TABLET | ORAL | 0 refills | Status: DC | PRN

## 2021-08-31 MED ORDER — OXYCODONE HCL ER 20 MG PO T12A: 20.0000 mg | EXTENDED_RELEASE_TABLET | Freq: Two times a day (BID) | ORAL | 0 refills | Status: DC

## 2021-08-31 NOTE — Patient Instructions (Signed)
Send My Chart in two weeks regarding : Lower Extremities Swelling.   Call your Primary Care Physician  We discussed slow weaning of Lyrica, we will discussed in two weeks

## 2021-08-31 NOTE — Progress Notes (Signed)
Subjective:    Patient ID: Bruce Olson, male    DOB: December 19, 1955, 66 y.o.   MRN: [id number]  HPI: Bruce Olson is a 66 y.o. male who returns for follow up appointment for chronic pain and medication refill. He states his pain is located in his mid- lower back and right lower extremities with numbness and tingling.He  rates his pain 7. His current exercise regime is walking and performing stretching exercises.  Bruce Olson arrived with bilateral lower extremities edema, he will F/U with his PCP, he will call office in two weeks with a update. We discussed a slow weaning of Lyrica, he verbalizes understanding.   Ms. Fritze Morphine equivalent is 127.50 MME.   Last UDS was Performed on 07/05/2021, it was consistent.    Pain Inventory Average Pain 7 Pain Right Now 7 My pain is sharp, stabbing, and tingling  In the last 24 hours, has pain interfered with the following? General activity 7 Relation with others 7 Enjoyment of life 7 What TIME of day is your pain at its worst? morning , daytime, evening, and night Sleep (in general) Poor  Pain is worse with: walking, sitting, and standing Pain improves with: heat/ice, medication, and TENS Relief from Meds: 3  Family History  Problem Relation Age of Onset   Anesthesia problems Neg Hx    Hypotension Neg Hx    Malignant hyperthermia Neg Hx    Pseudochol deficiency Neg Hx    Social History   Socioeconomic History   Marital status: Single    Spouse name: Not on file   Number of children: Not on file   Years of education: Not on file   Highest education level: Not on file  Occupational History   Not on file  Tobacco Use   Smoking status: Every Day    Packs/day: 1.00    Years: 33.00    Total pack years: 33.00    Types: Cigarettes   Smokeless tobacco: Never  Vaping Use   Vaping Use: Never used  Substance and Sexual Activity   Alcohol use: No   Drug use: No   Sexual activity: Never    Comment: back problems   Other Topics Concern   Not on file  Social History Narrative   Not on file   Social Determinants of Health   Financial Resource Strain: Not on file  Food Insecurity: Not on file  Transportation Needs: Not on file  Physical Activity: Not on file  Stress: Not on file  Social Connections: Not on file   Past Surgical History:  Procedure Laterality Date   BACK SURGERY  2013   Back surgery  (fusion)09/15/2010 & 2013   BACK SURGERY  03-2013   HARDWARE REMOVAL N/A 03/14/2013   Procedure: HARDWARE REMOVAL;  Surgeon: Eustace Moore, MD;  Location: [facility];  Service: Neurosurgery;  Laterality: N/A;   Past Surgical History:  Procedure Laterality Date   BACK SURGERY  2013   Back surgery  (fusion)09/15/2010 & 2013   BACK SURGERY  03-2013   HARDWARE REMOVAL N/A 03/14/2013   Procedure: HARDWARE REMOVAL;  Surgeon: Eustace Moore, MD;  Location: [facility];  Service: Neurosurgery;  Laterality: N/A;   Past Medical History:  Diagnosis Date   Goals of care, counseling/discussion 04/07/2016   Iron deficiency anemia due to chronic blood loss 10/21/2016   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   No pertinent past medical history    Occupational injury 2013   resulted in back  injury that led to surgery   Thyroid cancer ([facility])    BP 101/64   Pulse 85   Ht '5\' 5"'$  (1.651 m)   Wt 115 lb (52.2 kg)   SpO2 90%   BMI 19.14 kg/m   Opioid Risk Score:   Fall Risk Score:  `1  Depression screen PHQ 2/9     08/31/2021   12:53 PM 08/05/2021    1:17 PM 06/03/2021    1:02 PM 05/10/2021    1:11 PM 04/06/2021   12:51 PM 03/12/2021    1:00 PM 01/11/2021    1:23 PM  Depression screen PHQ 2/9  Decreased Interest 0 0 0 0 0 0 0  Down, Depressed, Hopeless 0 0 0 0 0 0 0  PHQ - 2 Score 0 0 0 0 0 0 0      Review of Systems  Musculoskeletal:  Positive for gait problem.       Lower back pain Right upper thigh pain  All other systems reviewed and are negative.      Objective:   Physical Exam Vitals and nursing note  reviewed.  Constitutional:      Appearance: Normal appearance.  Cardiovascular:     Rate and Rhythm: Normal rate and regular rhythm.     Pulses: Normal pulses.     Heart sounds: Normal heart sounds.  Pulmonary:     Effort: Pulmonary effort is normal.     Breath sounds: Normal breath sounds.  Musculoskeletal:     Cervical back: Normal range of motion and neck supple.     Right lower leg: Edema present.     Left lower leg: Edema present.     Comments: Normal Muscle Bulk and Muscle Testing Reveals:  Upper Extremities: Full ROM and Muscle Strength 5/5 Thoracic and Lumbar Hypersensitivity Lower Extremities: Full ROM and Muscle Strength 5/5 Arises from Table Slowly using cane for support Antalgic  Gait     Skin:    General: Skin is warm and dry.  Neurological:     Mental Status: He is alert and oriented to person, place, and time.  Psychiatric:        Mood and Affect: Mood normal.        Behavior: Behavior normal.         Assessment & Plan:  1. Lumbar postlaminectomy syndrome status post lumbar fusion x3: His most recent surgery was 03/14/2013. He had hardware removal and bone allograft waist and L2-3.  Continue to Monitor. Continue current Medication Regimen.08/31/2021 Refilled: oxyCODONE 7.5/'325mg'$  one tablet every 4 hours as needed #100 and :Oxycontin 20 mg one tablet every 12 hours #60. We will continue the opioid monitoring program, this consists of regular clinic visits, examinations, urine drug screen, pill counts as well as use of [place] Controlled Substance Reporting system. A 12 month History has been reviewed on the [place] on 08/31/2021.  2. Lumbar Radiculopathy: Continue current medication regimen with Lyrica. 08/31/2021 3. Insomnia: Continue current medication regimen with Trazodone.08/31/2021 4. Nausea: Continue Zofran as needed.Oncology Following.  08/31/2021 5. Cervical Spondylosis/ Cervicalgia/ Cervical  Radiculitis:  No complaints today. Continue current medication regimen with  Lyrica. 08/31/2021 6. Cervical Myofascial Pain Syndrome: Continue current treatment regimen. Continue to monitor. 08/31/2021. 7. Paresthesia: Continue Lyrica: Continue to Monitor.  08/31/2021.   F/U in 1 month

## 2021-09-05 ENCOUNTER — Encounter: Payer: Self-pay | Admitting: Registered Nurse

## 2021-10-01 ENCOUNTER — Encounter: Payer: Medicare HMO | Attending: Physical Medicine and Rehabilitation | Admitting: Registered Nurse

## 2021-10-01 ENCOUNTER — Encounter: Payer: Self-pay | Admitting: Registered Nurse

## 2021-10-01 VITALS — BP 141/72 | HR 88 | Ht 65.0 in | Wt 113.2 lb

## 2021-10-01 DIAGNOSIS — G894 Chronic pain syndrome: Secondary | ICD-10-CM | POA: Diagnosis present

## 2021-10-01 DIAGNOSIS — M545 Low back pain, unspecified: Secondary | ICD-10-CM | POA: Insufficient documentation

## 2021-10-01 DIAGNOSIS — Z79891 Long term (current) use of opiate analgesic: Secondary | ICD-10-CM | POA: Insufficient documentation

## 2021-10-01 DIAGNOSIS — M546 Pain in thoracic spine: Secondary | ICD-10-CM | POA: Insufficient documentation

## 2021-10-01 DIAGNOSIS — Z5181 Encounter for therapeutic drug level monitoring: Secondary | ICD-10-CM | POA: Insufficient documentation

## 2021-10-01 DIAGNOSIS — R202 Paresthesia of skin: Secondary | ICD-10-CM | POA: Diagnosis present

## 2021-10-01 DIAGNOSIS — Z9889 Other specified postprocedural states: Secondary | ICD-10-CM | POA: Insufficient documentation

## 2021-10-01 DIAGNOSIS — G8929 Other chronic pain: Secondary | ICD-10-CM | POA: Insufficient documentation

## 2021-10-01 DIAGNOSIS — M961 Postlaminectomy syndrome, not elsewhere classified: Secondary | ICD-10-CM | POA: Diagnosis present

## 2021-10-01 MED ORDER — OXYCODONE-ACETAMINOPHEN 7.5-325 MG PO TABS: 1.0000 | ORAL_TABLET | ORAL | 0 refills | Status: DC | PRN

## 2021-10-01 MED ORDER — OXYCODONE HCL ER 20 MG PO T12A: 20.0000 mg | EXTENDED_RELEASE_TABLET | Freq: Two times a day (BID) | ORAL | 0 refills | Status: DC

## 2021-10-01 NOTE — Progress Notes (Signed)
Subjective:    Patient ID: Bruce Olson, male    DOB: [birth year], 66 y.o.   MRN: [id number]  HPI: Bruce Olson is a 66 y.o. male who returns for follow up appointment for chronic pain and medication refill. He states his pain is located in his mid- lower back pain. He rates his pain 7. His current exercise regime is walking and performing stretching exercises.  Bruce Olson equivalent is 94.50 MME.   Last Oral Swab was Performed on 07/05/2021, it was consistent.     Pain Inventory Average Pain 7 Pain Right Now 7 My pain is sharp, stabbing, and tingling  In the last 24 hours, has pain interfered with the following? General activity 7 Relation with others 7 Enjoyment of life 7 What TIME of day is your pain at its worst? morning , daytime, evening, and night Sleep (in general) Poor  Pain is worse with: walking, sitting, and standing Pain improves with: heat/ice, medication, and TENS Relief from Meds: 3  Family History  Problem Relation Age of Onset   Anesthesia problems Neg Hx    Hypotension Neg Hx    Malignant hyperthermia Neg Hx    Pseudochol deficiency Neg Hx    Social History   Socioeconomic History   Marital status: Single    Spouse name: Not on file   Number of children: Not on file   Years of education: Not on file   Highest education level: Not on file  Occupational History   Not on file  Tobacco Use   Smoking status: Every Day    Packs/day: 1.00    Years: 33.00    Total pack years: 33.00    Types: Cigarettes   Smokeless tobacco: Never  Vaping Use   Vaping Use: Never used  Substance and Sexual Activity   Alcohol use: No   Drug use: No   Sexual activity: Never    Comment: back problems  Other Topics Concern   Not on file  Social History Narrative   Not on file   Social Determinants of Health   Financial Resource Strain: Not on file  Food Insecurity: Not on file  Transportation Needs: Not on file  Physical Activity: Not on  file  Stress: Not on file  Social Connections: Not on file   Past Surgical History:  Procedure Laterality Date   BACK SURGERY  2013   Back surgery  (fusion)09/15/2010 & 2013   BACK SURGERY  03-2013   HARDWARE REMOVAL N/A 03/14/2013   Procedure: HARDWARE REMOVAL;  Surgeon: Eustace Moore, MD;  Location: [facility];  Service: Neurosurgery;  Laterality: N/A;   Past Surgical History:  Procedure Laterality Date   BACK SURGERY  2013   Back surgery  (fusion)09/15/2010 & 2013   BACK SURGERY  03-2013   HARDWARE REMOVAL N/A 03/14/2013   Procedure: HARDWARE REMOVAL;  Surgeon: Eustace Moore, MD;  Location: [facility];  Service: Neurosurgery;  Laterality: N/A;   Past Medical History:  Diagnosis Date   Goals of care, counseling/discussion 04/07/2016   Iron deficiency anemia due to chronic blood loss 10/21/2016   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   No pertinent past medical history    Occupational injury 2013   resulted in back injury that led to surgery   Thyroid cancer ([place])    BP (!) 141/72   Pulse 88   Ht '5\' 5"'$  (1.651 m)   Wt 113 lb 3.2 oz (51.3 kg)   SpO2 98%  BMI 18.84 kg/m   Opioid Risk Score:   Fall Risk Score:  `1  Depression screen [facility] 2/9     10/01/2021   12:54 PM 08/31/2021   12:53 PM 08/05/2021    1:17 PM 06/03/2021    1:02 PM 05/10/2021    1:11 PM 04/06/2021   12:51 PM 03/12/2021    1:00 PM  Depression screen PHQ 2/9  Decreased Interest 0 0 0 0 0 0 0  Down, Depressed, Hopeless 0 0 0 0 0 0 0  PHQ - 2 Score 0 0 0 0 0 0 0     Review of Systems  Musculoskeletal:  Positive for back pain.  All other systems reviewed and are negative.     Objective:   Physical Exam Vitals and nursing note reviewed.  Constitutional:      Appearance: Normal appearance.  Cardiovascular:     Rate and Rhythm: Normal rate and regular rhythm.     Pulses: Normal pulses.     Heart sounds: Normal heart sounds.  Pulmonary:     Effort: Pulmonary effort is normal.     Breath sounds: Normal breath  sounds.  Musculoskeletal:     Cervical back: Normal range of motion and neck supple.     Comments: Normal Muscle Bulk and Muscle Testing Reveals:  Upper Extremities: Full ROM and Muscle Strength 5/5 Thoracic and Lumbar Hypersensitivity Lower Extremities: Full ROM and Muscle Strength 5/5 Arises from Table slowly using cane for support Narrow Based  Gait     Skin:    General: Skin is warm and dry.  Neurological:     Mental Status: He is alert and oriented to person, place, and time.  Psychiatric:        Mood and Affect: Mood normal.        Behavior: Behavior normal.         Assessment & Plan:  1. Lumbar postlaminectomy syndrome status post lumbar fusion x3: His most recent surgery was 03/14/2013. He had hardware removal and bone allograft waist and L2-3.  Continue to Monitor. Continue current Medication Regimen.10/01/2021 Refilled: oxyCODONE 7.5/'325mg'$  one tablet every 4 hours as needed #100 and :Oxycontin 20 mg one tablet every 12 hours #60.Second prescription sent for the following month to accommodate scheduled appointment.  We will continue the opioid monitoring program, this consists of regular clinic visits, examinations, urine drug screen, pill counts as well as use of [place] Controlled Substance Reporting system. A 12 month History has been reviewed on the [place] on 10/01/2021.  2. Lumbar Radiculopathy: Continue current medication regimen with Lyrica. 10/01/2021 3. Insomnia: Continue current medication regimen with Trazodone.10/01/2021 4. Nausea: Continue Zofran as needed.Oncology Following.  10/01/2021 5. Cervical Spondylosis/ Cervicalgia/ Cervical Radiculitis:  No complaints today. Continue current medication regimen with  Lyrica. 10/01/2021 6. Cervical Myofascial Pain Syndrome: Continue current treatment regimen. Continue to monitor. 10/01/2021. 7. Paresthesia: Continue Lyrica: Continue to Monitor.  10/01/2021.   F/U in 1  month

## 2021-10-29 ENCOUNTER — Telehealth: Payer: Self-pay | Admitting: *Deleted

## 2021-10-29 NOTE — Telephone Encounter (Signed)
Approval authorizes your coverage from 01/31/2021 - 01/30/2022.

## 2021-10-29 NOTE — Telephone Encounter (Signed)
Prior auth submitted to [facility] via CoverMyMeds for oxycodone ER 20 mg #60.

## 2021-11-22 ENCOUNTER — Encounter: Payer: Medicare HMO | Attending: Physical Medicine and Rehabilitation | Admitting: Registered Nurse

## 2021-11-22 ENCOUNTER — Other Ambulatory Visit: Payer: Self-pay | Admitting: Hematology & Oncology

## 2021-11-22 ENCOUNTER — Encounter: Payer: Self-pay | Admitting: Registered Nurse

## 2021-11-22 VITALS — BP 158/75 | HR 90 | Ht 65.0 in | Wt 115.8 lb

## 2021-11-22 DIAGNOSIS — R202 Paresthesia of skin: Secondary | ICD-10-CM | POA: Diagnosis present

## 2021-11-22 DIAGNOSIS — G8929 Other chronic pain: Secondary | ICD-10-CM | POA: Diagnosis present

## 2021-11-22 DIAGNOSIS — Z5181 Encounter for therapeutic drug level monitoring: Secondary | ICD-10-CM | POA: Diagnosis not present

## 2021-11-22 DIAGNOSIS — Z9889 Other specified postprocedural states: Secondary | ICD-10-CM | POA: Diagnosis present

## 2021-11-22 DIAGNOSIS — M545 Low back pain, unspecified: Secondary | ICD-10-CM | POA: Insufficient documentation

## 2021-11-22 DIAGNOSIS — Z79891 Long term (current) use of opiate analgesic: Secondary | ICD-10-CM | POA: Insufficient documentation

## 2021-11-22 DIAGNOSIS — G894 Chronic pain syndrome: Secondary | ICD-10-CM | POA: Diagnosis not present

## 2021-11-22 DIAGNOSIS — M961 Postlaminectomy syndrome, not elsewhere classified: Secondary | ICD-10-CM | POA: Insufficient documentation

## 2021-11-22 DIAGNOSIS — M546 Pain in thoracic spine: Secondary | ICD-10-CM | POA: Diagnosis present

## 2021-11-22 MED ORDER — OXYCODONE HCL ER 20 MG PO T12A: 20.0000 mg | EXTENDED_RELEASE_TABLET | Freq: Two times a day (BID) | ORAL | 0 refills | Status: DC

## 2021-11-22 MED ORDER — OXYCODONE-ACETAMINOPHEN 7.5-325 MG PO TABS: 1.0000 | ORAL_TABLET | ORAL | 0 refills | Status: DC | PRN

## 2021-11-22 MED ORDER — PREGABALIN 100 MG PO CAPS: 100.0000 mg | ORAL_CAPSULE | Freq: Three times a day (TID) | ORAL | 5 refills | Status: DC

## 2021-11-22 NOTE — Progress Notes (Signed)
Subjective:    Patient ID: Bruce Olson, male    DOB: 1955-09-18, 66 y.o.   MRN: [id number]  HPI: Bruce Olson is a 66 y.o. male who returns for follow up appointment for chronic pain and medication refill. He states his pain is located in his mid- lower back and right lower extremity numbness. He rates his pain 7. His current exercise regime is walking and performing stretching exercises.  Bruce Olson Morphine equivalent is 105.00 MME.   UDS ordered today.    Pain Inventory Average Pain 7 Pain Right Now 7 My pain is sharp, stabbing, and tingling  In the last 24 hours, has pain interfered with the following? General activity 7 Relation with others 7 Enjoyment of life 7 What TIME of day is your pain at its worst? morning , daytime, evening, and night Sleep (in general) Poor  Pain is worse with: walking, sitting, and standing Pain improves with: heat/ice, medication, and TENS Relief from Meds: 3  Family History  Problem Relation Age of Onset   Anesthesia problems Neg Hx    Hypotension Neg Hx    Malignant hyperthermia Neg Hx    Pseudochol deficiency Neg Hx    Social History   Socioeconomic History   Marital status: Single    Spouse name: Not on file   Number of children: Not on file   Years of education: Not on file   Highest education level: Not on file  Occupational History   Not on file  Tobacco Use   Smoking status: Every Day    Packs/day: 1.00    Years: 33.00    Total pack years: 33.00    Types: Cigarettes   Smokeless tobacco: Never  Vaping Use   Vaping Use: Never used  Substance and Sexual Activity   Alcohol use: No   Drug use: No   Sexual activity: Never    Comment: back problems  Other Topics Concern   Not on file  Social History Narrative   Not on file   Social Determinants of Health   Financial Resource Strain: Not on file  Food Insecurity: Not on file  Transportation Needs: Not on file  Physical Activity: Not on file  Stress:  Not on file  Social Connections: Not on file   Past Surgical History:  Procedure Laterality Date   BACK SURGERY  2013   Back surgery  (fusion)09/15/2010 & 2013   BACK SURGERY  03-2013   HARDWARE REMOVAL N/A 03/14/2013   Procedure: HARDWARE REMOVAL;  Surgeon: Eustace Moore, MD;  Location: [facility];  Service: Neurosurgery;  Laterality: N/A;   Past Surgical History:  Procedure Laterality Date   BACK SURGERY  2013   Back surgery  (fusion)09/15/2010 & 2013   BACK SURGERY  03-2013   HARDWARE REMOVAL N/A 03/14/2013   Procedure: HARDWARE REMOVAL;  Surgeon: Eustace Moore, MD;  Location: [place] [facility];  Service: Neurosurgery;  Laterality: N/A;   Past Medical History:  Diagnosis Date   Goals of care, counseling/discussion 04/07/2016   Iron deficiency anemia due to chronic blood loss 10/21/2016   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   No pertinent past medical history    Occupational injury 2013   resulted in back injury that led to surgery   Thyroid cancer ([place])    BP (!) 158/75   Pulse 90   Ht '5\' 5"'$  (1.651 m)   Wt 115 lb 12.8 oz (52.5 kg)   SpO2 92%   BMI 19.27 kg/m  Opioid Risk Score:   Fall Risk Score:  `1  Depression screen [facility] 2/9     10/01/2021   12:54 PM 08/31/2021   12:53 PM 08/05/2021    1:17 PM 06/03/2021    1:02 PM 05/10/2021    1:11 PM 04/06/2021   12:51 PM 03/12/2021    1:00 PM  Depression screen PHQ 2/9  Decreased Interest 0 0 0 0 0 0 0  Down, Depressed, Hopeless 0 0 0 0 0 0 0  PHQ - 2 Score 0 0 0 0 0 0 0      Review of Systems  Musculoskeletal:  Positive for back pain.  All other systems reviewed and are negative.     Objective:   Physical Exam Vitals and nursing note reviewed.  Constitutional:      Appearance: Normal appearance.  Cardiovascular:     Rate and Rhythm: Normal rate and regular rhythm.     Pulses: Normal pulses.     Heart sounds: Normal heart sounds.  Pulmonary:     Effort: Pulmonary effort is normal.     Breath sounds: Normal breath sounds.   Musculoskeletal:     Cervical back: Normal range of motion and neck supple.     Comments: Normal Muscle Bulk and Muscle Testing Reveals:  Upper Extremities: Decreased ROM 90 Degrees  and Muscle Strength 5/5 Thoracic and Lumbar Hypersensitivity Lower Extremities: Full ROM and Muscle Strength 5/5 Arises form Table slowly using cane for support Antalgic  Gait     Skin:    General: Skin is warm and dry.  Neurological:     Mental Status: He is alert and oriented to person, place, and time.  Psychiatric:        Mood and Affect: Mood normal.        Behavior: Behavior normal.         Assessment & Plan:  1. Lumbar postlaminectomy syndrome status post lumbar fusion x3: His most recent surgery was 03/14/2013. He had hardware removal and bone allograft waist and L2-3.  Continue to Monitor. Continue current Medication Regimen.11/22/2021 Refilled: oxyCODONE 7.5/'325mg'$  one tablet every 4 hours as needed #100 and :Oxycontin 20 mg one tablet every 12 hours #60.We will continue the opioid monitoring program, this consists of regular clinic visits, examinations, urine drug screen, pill counts as well as use of [place] Controlled Substance Reporting system. A 12 month History has been reviewed on the [place] on 11/22/2021.  2. Lumbar Radiculopathy: Continue current medication regimen with Lyrica. 11/22/2021 3. Insomnia: Continue current medication regimen with Trazodone.11/22/2021 4. Nausea: Continue Zofran as needed.Oncology Following.  11/22/2021 5. Cervical Spondylosis/ Cervicalgia/ Cervical Radiculitis:  No complaints today. Continue current medication regimen with  Lyrica. 11/22/2021 6. Cervical Myofascial Pain Syndrome: Continue current treatment regimen. Continue to monitor. 11/22/2021. 7. Paresthesia: Continue Lyrica: Continue to Monitor.  11/22/2021.   F/U in 1 month

## 2021-11-26 LAB — TOXASSURE SELECT,+ANTIDEPR,UR

## 2021-12-01 ENCOUNTER — Telehealth: Payer: Self-pay | Admitting: *Deleted

## 2021-12-01 NOTE — Telephone Encounter (Signed)
Urine drug screen for this encounter is consistent for prescribed medication 

## 2021-12-17 ENCOUNTER — Other Ambulatory Visit: Payer: Self-pay | Admitting: Hematology & Oncology

## 2021-12-17 DIAGNOSIS — C090 Malignant neoplasm of tonsillar fossa: Secondary | ICD-10-CM

## 2021-12-18 ENCOUNTER — Other Ambulatory Visit: Payer: Self-pay | Admitting: Hematology & Oncology

## 2021-12-21 ENCOUNTER — Encounter: Payer: Medicare HMO | Attending: Physical Medicine and Rehabilitation | Admitting: Registered Nurse

## 2021-12-21 VITALS — BP 126/73 | HR 90 | Ht 65.0 in | Wt 113.2 lb

## 2021-12-21 DIAGNOSIS — Z5181 Encounter for therapeutic drug level monitoring: Secondary | ICD-10-CM | POA: Diagnosis present

## 2021-12-21 DIAGNOSIS — R202 Paresthesia of skin: Secondary | ICD-10-CM

## 2021-12-21 DIAGNOSIS — M546 Pain in thoracic spine: Secondary | ICD-10-CM | POA: Diagnosis not present

## 2021-12-21 DIAGNOSIS — M961 Postlaminectomy syndrome, not elsewhere classified: Secondary | ICD-10-CM | POA: Diagnosis not present

## 2021-12-21 DIAGNOSIS — G8929 Other chronic pain: Secondary | ICD-10-CM | POA: Diagnosis present

## 2021-12-21 DIAGNOSIS — G894 Chronic pain syndrome: Secondary | ICD-10-CM

## 2021-12-21 DIAGNOSIS — Z79891 Long term (current) use of opiate analgesic: Secondary | ICD-10-CM

## 2021-12-21 DIAGNOSIS — M545 Low back pain, unspecified: Secondary | ICD-10-CM | POA: Diagnosis not present

## 2021-12-21 DIAGNOSIS — Z9889 Other specified postprocedural states: Secondary | ICD-10-CM | POA: Diagnosis not present

## 2021-12-21 MED ORDER — OXYCODONE-ACETAMINOPHEN 7.5-325 MG PO TABS: 1.0000 | ORAL_TABLET | ORAL | 0 refills | Status: DC | PRN

## 2021-12-21 MED ORDER — OXYCODONE HCL ER 20 MG PO T12A: 20.0000 mg | EXTENDED_RELEASE_TABLET | Freq: Two times a day (BID) | ORAL | 0 refills | Status: DC

## 2021-12-21 NOTE — Progress Notes (Unsigned)
Subjective:    Patient ID: Bruce Olson, male    DOB: 1956-01-22, 66 y.o.   MRN: [id number]  HPI: Bruce Olson is a 66 y.o. male who returns for follow up appointment for chronic pain and medication refill. states *** pain is located in  ***. rates pain ***. current exercise regime is walking and performing stretching exercises.  Ms. Bruso Morphine equivalent is *** MME.   Last UDS was Performed on 11/22/2021, it was consistent.     Pain Inventory Average Pain 7 Pain Right Now 7 My pain is sharp, stabbing, and tingling  In the last 24 hours, has pain interfered with the following? General activity 7 Relation with others 7 Enjoyment of life 7 What TIME of day is your pain at its worst? morning , daytime, evening, and night Sleep (in general) Poor  Pain is worse with: walking, sitting, and standing Pain improves with: heat/ice, medication, and TENS Relief from Meds: 3  Family History  Problem Relation Age of Onset   Anesthesia problems Neg Hx    Hypotension Neg Hx    Malignant hyperthermia Neg Hx    Pseudochol deficiency Neg Hx    Social History   Socioeconomic History   Marital status: Single    Spouse name: Not on file   Number of children: Not on file   Years of education: Not on file   Highest education level: Not on file  Occupational History   Not on file  Tobacco Use   Smoking status: Every Day    Packs/day: 1.00    Years: 33.00    Total pack years: 33.00    Types: Cigarettes   Smokeless tobacco: Never  Vaping Use   Vaping Use: Never used  Substance and Sexual Activity   Alcohol use: No   Drug use: No   Sexual activity: Never    Comment: back problems  Other Topics Concern   Not on file  Social History Narrative   Not on file   Social Determinants of Health   Financial Resource Strain: Not on file  Food Insecurity: Not on file  Transportation Needs: Not on file  Physical Activity: Not on file  Stress: Not on file  Social  Connections: Not on file   Past Surgical History:  Procedure Laterality Date   BACK SURGERY  2013   Back surgery  (fusion)09/15/2010 & 2013   BACK SURGERY  03-2013   HARDWARE REMOVAL N/A 03/14/2013   Procedure: HARDWARE REMOVAL;  Surgeon: Eustace Moore, MD;  Location: [facility];  Service: Neurosurgery;  Laterality: N/A;   Past Surgical History:  Procedure Laterality Date   BACK SURGERY  2013   Back surgery  (fusion)09/15/2010 & 2013   BACK SURGERY  03-2013   HARDWARE REMOVAL N/A 03/14/2013   Procedure: HARDWARE REMOVAL;  Surgeon: Eustace Moore, MD;  Location: [place] [facility];  Service: Neurosurgery;  Laterality: N/A;   Past Medical History:  Diagnosis Date   Goals of care, counseling/discussion 04/07/2016   Iron deficiency anemia due to chronic blood loss 10/21/2016   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   No pertinent past medical history    Occupational injury 2013   resulted in back injury that led to surgery   Thyroid cancer ([place])    BP 126/73   Pulse 90   Ht '5\' 5"'$  (1.651 m)   Wt 113 lb 3.2 oz (51.3 kg)   SpO2 90%   BMI 18.84 kg/m   Opioid Risk Score:  Fall Risk Score:  `1  Depression screen [facility] 2/9     10/01/2021   12:54 PM 08/31/2021   12:53 PM 08/05/2021    1:17 PM 06/03/2021    1:02 PM 05/10/2021    1:11 PM 04/06/2021   12:51 PM 03/12/2021    1:00 PM  Depression screen [facility] 2/9  Decreased Interest 0 0 0 0 0 0 0  Down, Depressed, Hopeless 0 0 0 0 0 0 0  PHQ - 2 Score 0 0 0 0 0 0 0     Review of Systems  Musculoskeletal:  Positive for back pain.       Right thigh pain  All other systems reviewed and are negative.     Objective:   Physical Exam        Assessment & Plan:  1. Lumbar postlaminectomy syndrome status post lumbar fusion x3: His most recent surgery was 03/14/2013. He had hardware removal and bone allograft waist and L2-3.  Continue to Monitor. Continue current Medication Regimen.11/22/2021 Refilled: oxyCODONE 7.5/'325mg'$  one tablet every 4 hours as needed #100  and :Oxycontin 20 mg one tablet every 12 hours #60.We will continue the opioid monitoring program, this consists of regular clinic visits, examinations, urine drug screen, pill counts as well as use of [place] Controlled Substance Reporting system. A 12 month History has been reviewed on the [place] on 11/22/2021.  2. Lumbar Radiculopathy: Continue current medication regimen with Lyrica. 11/22/2021 3. Insomnia: Continue current medication regimen with Trazodone.11/22/2021 4. Nausea: Continue Zofran as needed.Oncology Following.  11/22/2021 5. Cervical Spondylosis/ Cervicalgia/ Cervical Radiculitis:  No complaints today. Continue current medication regimen with  Lyrica. 11/22/2021 6. Cervical Myofascial Pain Syndrome: Continue current treatment regimen. Continue to monitor. 11/22/2021. 7. Paresthesia: Continue Lyrica: Continue to Monitor.  11/22/2021.   F/U in 1 month

## 2021-12-22 ENCOUNTER — Encounter: Payer: Self-pay | Admitting: Registered Nurse

## 2021-12-30 ENCOUNTER — Ambulatory Visit (HOSPITAL_BASED_OUTPATIENT_CLINIC_OR_DEPARTMENT_OTHER)
Admission: RE | Admit: 2021-12-30 | Discharge: 2021-12-30 | Disposition: A | Discharge status: Home / Self Care | Payer: Medicare HMO | Source: Ambulatory Visit | Attending: Hematology & Oncology | Admitting: Hematology & Oncology

## 2021-12-30 ENCOUNTER — Inpatient Hospital Stay: Payer: Medicare HMO | Admitting: Hematology & Oncology

## 2021-12-30 ENCOUNTER — Other Ambulatory Visit: Payer: Self-pay

## 2021-12-30 ENCOUNTER — Encounter: Payer: Self-pay | Admitting: Hematology & Oncology

## 2021-12-30 ENCOUNTER — Inpatient Hospital Stay: Payer: Medicare HMO | Attending: Hematology & Oncology

## 2021-12-30 VITALS — BP 152/56 | HR 80 | Temp 97.4°F | Resp 16 | Wt 116.0 lb

## 2021-12-30 DIAGNOSIS — R112 Nausea with vomiting, unspecified: Secondary | ICD-10-CM | POA: Diagnosis not present

## 2021-12-30 DIAGNOSIS — E034 Atrophy of thyroid (acquired): Secondary | ICD-10-CM

## 2021-12-30 DIAGNOSIS — C090 Malignant neoplasm of tonsillar fossa: Secondary | ICD-10-CM

## 2021-12-30 DIAGNOSIS — Z79899 Other long term (current) drug therapy: Secondary | ICD-10-CM | POA: Diagnosis not present

## 2021-12-30 DIAGNOSIS — F17210 Nicotine dependence, cigarettes, uncomplicated: Secondary | ICD-10-CM | POA: Insufficient documentation

## 2021-12-30 DIAGNOSIS — Z9221 Personal history of antineoplastic chemotherapy: Secondary | ICD-10-CM | POA: Diagnosis not present

## 2021-12-30 DIAGNOSIS — E039 Hypothyroidism, unspecified: Secondary | ICD-10-CM | POA: Insufficient documentation

## 2021-12-30 DIAGNOSIS — Z85818 Personal history of malignant neoplasm of other sites of lip, oral cavity, and pharynx: Secondary | ICD-10-CM | POA: Insufficient documentation

## 2021-12-30 DIAGNOSIS — R911 Solitary pulmonary nodule: Secondary | ICD-10-CM

## 2021-12-30 DIAGNOSIS — Z923 Personal history of irradiation: Secondary | ICD-10-CM | POA: Diagnosis not present

## 2021-12-30 DIAGNOSIS — E611 Iron deficiency: Secondary | ICD-10-CM | POA: Diagnosis not present

## 2021-12-30 DIAGNOSIS — Z79890 Hormone replacement therapy (postmenopausal): Secondary | ICD-10-CM | POA: Insufficient documentation

## 2021-12-30 DIAGNOSIS — R5383 Other fatigue: Secondary | ICD-10-CM | POA: Diagnosis not present

## 2021-12-30 LAB — CBC WITH DIFFERENTIAL (CANCER CENTER ONLY)
Abs Immature Granulocytes: 0.02 10*3/uL (ref 0.00–0.07)
Basophils Absolute: 0 10*3/uL (ref 0.0–0.1)
Basophils Relative: 1 %
Eosinophils Absolute: 0.1 10*3/uL (ref 0.0–0.5)
Eosinophils Relative: 1 %
HCT: 42.1 % (ref 39.0–52.0)
Hemoglobin: 14.2 g/dL (ref 13.0–17.0)
Immature Granulocytes: 0 %
Lymphocytes Relative: 18 %
Lymphs Abs: 1.1 10*3/uL (ref 0.7–4.0)
MCH: 31.3 pg (ref 26.0–34.0)
MCHC: 33.7 g/dL (ref 30.0–36.0)
MCV: 92.7 fL (ref 80.0–100.0)
Monocytes Absolute: 0.6 10*3/uL (ref 0.1–1.0)
Monocytes Relative: 11 %
Neutro Abs: 4 10*3/uL (ref 1.7–7.7)
Neutrophils Relative %: 69 %
Platelet Count: 160 10*3/uL (ref 150–400)
RBC: 4.54 MIL/uL (ref 4.22–5.81)
RDW: 12.5 % (ref 11.5–15.5)
WBC Count: 5.8 10*3/uL (ref 4.0–10.5)
nRBC: 0 % (ref 0.0–0.2)

## 2021-12-30 LAB — CMP (CANCER CENTER ONLY)
ALT: 11 U/L (ref 0–44)
AST: 16 U/L (ref 15–41)
Albumin: 4.3 g/dL (ref 3.5–5.0)
Alkaline Phosphatase: 61 U/L (ref 38–126)
Anion gap: 6 (ref 5–15)
BUN: 12 mg/dL (ref 8–23)
CO2: 33 mmol/L — ABNORMAL HIGH (ref 22–32)
Calcium: 9.3 mg/dL (ref 8.9–10.3)
Chloride: 97 mmol/L — ABNORMAL LOW (ref 98–111)
Creatinine: 0.86 mg/dL (ref 0.61–1.24)
GFR, Estimated: >60 mL/min (ref >60)
Glucose, Bld: 125 mg/dL — ABNORMAL HIGH (ref 70–99)
Potassium: 4.3 mmol/L (ref 3.5–5.1)
Sodium: 136 mmol/L (ref 135–145)
Total Bilirubin: 0.3 mg/dL (ref 0.3–1.2)
Total Protein: 6.3 g/dL — ABNORMAL LOW (ref 6.5–8.1)

## 2021-12-30 LAB — TSH: TSH: 1.618 u[IU]/mL (ref 0.350–4.500)

## 2021-12-30 LAB — FERRITIN: Ferritin: 185 ng/mL (ref 24–336)

## 2021-12-30 NOTE — Progress Notes (Signed)
Hematology and Oncology Follow Up Visit  Bruce Olson [id number] April 08, 1955 66 y.o. 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 is here today for follow-up.  We saw him back in May.  Since then, has been doing okay.  His weight has gone up 3 pounds.  He says he does not throw up as much.  He just has to watch how much he eats.  He still smokes a pack a day.  Because of this, we probably will need to get a chest x-ray on him.  At some point, he probably would be a candidate for a CT scan of the chest.  He has had no cough.  He has had no nausea.  Again, he had does have the vomiting.  This is not as bad.  He has had no change in bowel or bladder habits.  He does have hypothyroidism from his past radiation therapy.  His last TSH a year ago was 0.96.  He has had no bleeding.  He has had no leg swelling.  Overall, I would say his performance status is probably ECOG 2.     Medications:  Allergies as of 12/30/2021   No Known Allergies      Medication List        Accurate as of December 30, 2021  1:53 PM. If you have any questions, ask your nurse or doctor.          albuterol 108 (90 Base) MCG/ACT inhaler Commonly known as: VENTOLIN HFA Inhale into the lungs.   ALBUTEROL IN 2 puff EVERY 6 HOURS (route: inhalation)   docusate sodium 100 MG capsule Commonly known as: COLACE Take 100 mg by mouth daily.   dronabinol 2.5 MG capsule Commonly known as: MARINOL Take 1 capsule (2.5 mg total) by mouth 2 (two) times daily before lunch and supper.   guaiFENesin 600 MG 12 hr tablet Commonly known as: MUCINEX Take by mouth.   levothyroxine 50 MCG tablet Commonly known as: SYNTHROID TAKE 1 TABLET BY MOUTH EVERY MORNING ON AN EMPTY STOMACH   metoCLOPramide  10 MG tablet Commonly known as: REGLAN TAKE 1 TABLET BY MOUTH 4 TIMES DAILY   multivitamin with minerals Tabs tablet Take 1 tablet by mouth daily.   Narcan 4 MG/0.1ML Liqd nasal spray kit Generic drug: naloxone   OLANZapine 10 MG tablet Commonly known as: ZYPREXA TAKE 1 TABLET BY MOUTH EVERY NIGHT AT BEDTIME   oxyCODONE 20 mg 12 hr tablet Commonly known as: OxyCONTIN Take 1 tablet (20 mg total) by mouth every 12 (twelve) hours.   oxyCODONE-acetaminophen 7.5-325 MG tablet Commonly known as: Percocet Take 1 tablet by mouth every 4 (four) hours as needed. May take one extra tablet when pain is sever. No more than 4 a day.   OXYGEN   pilocarpine 5 MG tablet Commonly known as: SALAGEN Take 1 tablet by mouth 2 (two) times daily.   pregabalin 100 MG capsule Commonly known as: LYRICA Take 1 capsule (100 mg total) by mouth 3 (three) times daily.        Allergies: No Known Allergies  Past Medical History, Surgical history, Social history, and Family History were reviewed and updated.  Review of Systems: Review of Systems  Constitutional:  Positive for malaise/fatigue.  HENT: Negative.    Eyes: Negative.   Respiratory: Negative.    Cardiovascular: Negative.   Gastrointestinal:  Positive for nausea and vomiting.  Genitourinary: Negative.   Musculoskeletal:  Positive for back pain.  Skin: Negative.   Neurological: Negative.   Endo/Heme/Allergies: Negative.   Psychiatric/Behavioral: Negative.       Physical Exam:  weight is 116 lb (52.6 kg). His oral temperature is 97.4 F (36.3 C) (abnormal). His blood pressure is 152/56 (abnormal) and his pulse is 80. His respiration is 16 and oxygen saturation is 95%.   Wt Readings from Last 3 Encounters:  12/30/21 116 lb (52.6 kg)  12/21/21 113 lb 3.2 oz (51.3 kg)  11/22/21 115 lb 12.8 oz (52.5 kg)    Physical Activity: Not on file   Physical Exam Vitals reviewed.  HENT:     Head: Normocephalic and atraumatic.  Eyes:      Pupils: Pupils are equal, round, and reactive to light.  Cardiovascular:     Rate and Rhythm: Normal rate and regular rhythm.     Heart sounds: Normal heart sounds.  Pulmonary:     Effort: Pulmonary effort is normal.     Breath sounds: Normal breath sounds.  Abdominal:     General: Bowel sounds are normal.     Palpations: Abdomen is soft.  Musculoskeletal:        General: No tenderness or deformity. Normal range of motion.     Cervical back: Normal range of motion.  Lymphadenopathy:     Cervical: No cervical adenopathy.  Skin:    General: Skin is warm and dry.     Findings: No erythema or rash.  Neurological:     Mental Status: He is alert and oriented to person, place, and time.  Psychiatric:        Behavior: Behavior normal.        Thought Content: Thought content normal.        Judgment: Judgment normal.       Lab Results  Component Value Date   WBC 5.8 12/30/2021   HGB 14.2 12/30/2021   HCT 42.1 12/30/2021   MCV 92.7 12/30/2021   PLT 160 12/30/2021   Lab Results  Component Value Date   FERRITIN 418 (H) 12/14/2020   IRON 39 (L) 12/14/2020   TIBC 269 12/14/2020   UIBC 230 12/14/2020   IRONPCTSAT 14 (L) 12/14/2020   Lab Results  Component Value Date   RETICCTPCT 1.7 06/12/2020   RBC 4.54 12/30/2021   No results found for: "KPAFRELGTCHN", "LAMBDASER", "KAPLAMBRATIO" No results found for: "IGGSERUM", "IGA", "IGMSERUM" No results found for: "TOTALPROTELP", "ALBUMINELP", "A1GS", "A2GS", "BETS", "BETA2SER", "GAMS", "MSPIKE", "SPEI"   Chemistry      Component Value Date/Time   NA 137 06/30/2021 1458   NA 138 01/20/2017 1324   NA 135 (L) 04/05/2016 1333   K 4.7 06/30/2021 1458   K 4.5 01/20/2017 1324   K 4.7 04/05/2016 1333   CL 99 06/30/2021 1458   CL 97 (L) 01/20/2017 1324   CO2 33 (H) 06/30/2021 1458   CO2 30 01/20/2017 1324   CO2 29 04/05/2016 1333   BUN 17 06/30/2021 1458   BUN 15 01/20/2017 1324   BUN 23.6 04/05/2016 1333   CREATININE 0.94  06/30/2021 1458   CREATININE 1.2 01/20/2017 1324   CREATININE 1.1 04/05/2016 1333      Component Value Date/Time   CALCIUM 9.3 06/30/2021 1458   CALCIUM 9.2 01/20/2017 1324   CALCIUM 9.2 04/05/2016 1333  ALKPHOS 52 06/30/2021 1458   ALKPHOS 51 01/20/2017 1324   ALKPHOS 67 04/05/2016 1333   AST 17 06/30/2021 1458   AST 17 04/05/2016 1333   ALT 12 06/30/2021 1458   ALT 26 01/20/2017 1324   ALT 10 04/05/2016 1333   BILITOT 0.3 06/30/2021 1458   BILITOT 0.30 04/05/2016 1333       Impression and Plan: Mr. Zilka is a very pleasant 66 yo caucasian gentleman with history of squamous cell carcinoma of the left tonsil.  Thankfully, the tumor is HPV positive.   He completed chemo/radiation therapy with [facility] in August 2017.  Again, I worry about the smoking.  We will go ahead and get a chest x-ray on him today.  We will see what his TSH is.  We will plan to get him back in another 6 months.  We will get him through the Winter.   [name], MD 11/30/20231:53 PM

## 2021-12-31 ENCOUNTER — Telehealth: Payer: Self-pay | Admitting: *Deleted

## 2021-12-31 ENCOUNTER — Other Ambulatory Visit: Payer: Self-pay | Admitting: Hematology & Oncology

## 2021-12-31 LAB — IRON AND IRON BINDING CAPACITY (CC-WL,HP ONLY)
Iron: 26 ug/dL — ABNORMAL LOW (ref 45–182)
Saturation Ratios: 8 % — ABNORMAL LOW (ref 17.9–39.5)
TIBC: 319 ug/dL (ref 250–450)
UIBC: 293 ug/dL (ref 117–376)

## 2021-12-31 NOTE — Telephone Encounter (Signed)
Pt notified per order of Dr. Marin Olp that his iron level is incredibly low and that he will need three doses of IV iron.  Informed pt that scheduling would give him a call to get these appts scheduled for him.  Pt is appreciative of call and has no questions or concerns at this time.

## 2021-12-31 NOTE — Telephone Encounter (Signed)
-----   Message from [name], MD sent at 12/31/2021  1:26 PM EST ----- Call and let him know that the iron is incredibly low.  We need to give some IV iron to help get this set up.  This will make him feel better.  Please set this up for next week or so.  Thanks.  [name]

## 2022-01-03 ENCOUNTER — Telehealth: Payer: Self-pay | Admitting: *Deleted

## 2022-01-03 NOTE — Addendum Note (Signed)
Addended by: Burney Gauze R on: 01/03/2022 03:02 PM   Modules accepted: Orders

## 2022-01-03 NOTE — Telephone Encounter (Signed)
Received call from [facility] with a call report on patients CXR.  It is recommended that patient follow up with CT chest. Dr Marin Olp aware.  PAtient notified of above.  Dr Marin Olp to enter CT scan orders.

## 2022-01-06 ENCOUNTER — Telehealth: Payer: Self-pay | Admitting: *Deleted

## 2022-01-06 NOTE — Telephone Encounter (Signed)
Per scheduling message Bruce Olson and was unable to lvm - mailbox not set up - will try again later to schedule (3) doses of IV Iron

## 2022-01-17 ENCOUNTER — Inpatient Hospital Stay: Payer: Medicare HMO | Attending: Hematology & Oncology

## 2022-01-17 VITALS — BP 130/91 | HR 73 | Temp 97.6°F | Resp 17 | Wt 112.0 lb

## 2022-01-17 DIAGNOSIS — K909 Intestinal malabsorption, unspecified: Secondary | ICD-10-CM | POA: Diagnosis present

## 2022-01-17 DIAGNOSIS — D500 Iron deficiency anemia secondary to blood loss (chronic): Secondary | ICD-10-CM | POA: Insufficient documentation

## 2022-01-17 MED ORDER — SODIUM CHLORIDE 0.9 % IV SOLN: INTRAVENOUS | Status: DC

## 2022-01-17 MED ORDER — SODIUM CHLORIDE 0.9 % IV SOLN
300.0000 mg | Freq: Once | INTRAVENOUS | Status: AC
  Administered 2022-01-17: 300 mg via INTRAVENOUS
  Filled 2022-01-17: qty 300

## 2022-01-17 NOTE — Patient Instructions (Signed)

## 2022-01-20 ENCOUNTER — Encounter: Payer: Medicare HMO | Attending: Physical Medicine and Rehabilitation | Admitting: Registered Nurse

## 2022-01-20 ENCOUNTER — Ambulatory Visit (HOSPITAL_BASED_OUTPATIENT_CLINIC_OR_DEPARTMENT_OTHER): Payer: Medicare HMO

## 2022-01-20 ENCOUNTER — Encounter: Payer: Self-pay | Admitting: Registered Nurse

## 2022-01-20 VITALS — BP 136/69 | HR 84 | Ht 65.0 in | Wt 112.0 lb

## 2022-01-20 DIAGNOSIS — M546 Pain in thoracic spine: Secondary | ICD-10-CM | POA: Diagnosis present

## 2022-01-20 DIAGNOSIS — Z9889 Other specified postprocedural states: Secondary | ICD-10-CM | POA: Diagnosis present

## 2022-01-20 DIAGNOSIS — Z79891 Long term (current) use of opiate analgesic: Secondary | ICD-10-CM

## 2022-01-20 DIAGNOSIS — G894 Chronic pain syndrome: Secondary | ICD-10-CM | POA: Diagnosis present

## 2022-01-20 DIAGNOSIS — G8929 Other chronic pain: Secondary | ICD-10-CM

## 2022-01-20 DIAGNOSIS — R202 Paresthesia of skin: Secondary | ICD-10-CM | POA: Diagnosis present

## 2022-01-20 DIAGNOSIS — Z5181 Encounter for therapeutic drug level monitoring: Secondary | ICD-10-CM | POA: Diagnosis present

## 2022-01-20 DIAGNOSIS — M545 Low back pain, unspecified: Secondary | ICD-10-CM | POA: Diagnosis present

## 2022-01-20 DIAGNOSIS — M961 Postlaminectomy syndrome, not elsewhere classified: Secondary | ICD-10-CM

## 2022-01-20 MED ORDER — OXYCODONE HCL ER 20 MG PO T12A: 20.0000 mg | EXTENDED_RELEASE_TABLET | Freq: Two times a day (BID) | ORAL | 0 refills | Status: DC

## 2022-01-20 MED ORDER — OXYCODONE-ACETAMINOPHEN 7.5-325 MG PO TABS: 1.0000 | ORAL_TABLET | ORAL | 0 refills | Status: DC | PRN

## 2022-01-20 NOTE — Progress Notes (Signed)
Subjective:    Patient ID: Bruce Olson, male    DOB: 13-Dec-1955, 66 y.o.   MRN: [id number]  HPI: Bruce Olson is a 66 y.o. male who returns for follow up appointment for chronic pain and medication refill. He states his pain is located in his lower back. He rates his pain 7. His current exercise regime is walking and performing stretching exercises.  Bruce Olson Morphine equivalent is 103.75 MME.  Last UDS was Performed on 11/22/2021, it was consistent.    Pain Inventory Average Pain 7 Pain Right Now 7 My pain is sharp, stabbing, and tingling  In the last 24 hours, has pain interfered with the following? General activity 7 Relation with others 7 Enjoyment of life 7 What TIME of day is your pain at its worst? morning , daytime, evening, and night Sleep (in general) Poor  Pain is worse with: walking, sitting, and standing Pain improves with: heat/ice, medication, and TENS Relief from Meds: 3  Family History  Problem Relation Age of Onset   Anesthesia problems Neg Hx    Hypotension Neg Hx    Malignant hyperthermia Neg Hx    Pseudochol deficiency Neg Hx    Social History   Socioeconomic History   Marital status: Single    Spouse name: Not on file   Number of children: Not on file   Years of education: Not on file   Highest education level: Not on file  Occupational History   Not on file  Tobacco Use   Smoking status: Every Day    Packs/day: 1.00    Years: 33.00    Total pack years: 33.00    Types: Cigarettes   Smokeless tobacco: Never  Vaping Use   Vaping Use: Never used  Substance and Sexual Activity   Alcohol use: No   Drug use: No   Sexual activity: Never    Comment: back problems  Other Topics Concern   Not on file  Social History Narrative   Not on file   Social Determinants of Health   Financial Resource Strain: Not on file  Food Insecurity: Not on file  Transportation Needs: Not on file  Physical Activity: Not on file  Stress: Not  on file  Social Connections: Not on file   Past Surgical History:  Procedure Laterality Date   BACK SURGERY  2013   Back surgery  (fusion)09/15/2010 & 2013   BACK SURGERY  03-2013   HARDWARE REMOVAL N/A 03/14/2013   Procedure: HARDWARE REMOVAL;  Surgeon: Eustace Moore, MD;  Location: [facility];  Service: Neurosurgery;  Laterality: N/A;   Past Surgical History:  Procedure Laterality Date   BACK SURGERY  2013   Back surgery  (fusion)09/15/2010 & 2013   BACK SURGERY  03-2013   HARDWARE REMOVAL N/A 03/14/2013   Procedure: HARDWARE REMOVAL;  Surgeon: Eustace Moore, MD;  Location: [place] [facility];  Service: Neurosurgery;  Laterality: N/A;   Past Medical History:  Diagnosis Date   Goals of care, counseling/discussion 04/07/2016   Iron deficiency anemia due to chronic blood loss 10/21/2016   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   No pertinent past medical history    Occupational injury 2013   resulted in back injury that led to surgery   Thyroid cancer ([place])    Ht '5\' 5"'$  (1.651 m)   BMI 18.64 kg/m   Opioid Risk Score:   Fall Risk Score:  `1  Depression screen [facility] 2/9     10/01/2021  12:54 PM 08/31/2021   12:53 PM 08/05/2021    1:17 PM 06/03/2021    1:02 PM 05/10/2021    1:11 PM 04/06/2021   12:51 PM 03/12/2021    1:00 PM  Depression screen PHQ 2/9  Decreased Interest 0 0 0 0 0 0 0  Down, Depressed, Hopeless 0 0 0 0 0 0 0  PHQ - 2 Score 0 0 0 0 0 0 0      Review of Systems  Musculoskeletal:  Positive for back pain.       Right thigh pain  All other systems reviewed and are negative.     Objective:   Physical Exam Vitals and nursing note reviewed.  Constitutional:      Appearance: Normal appearance.  Cardiovascular:     Rate and Rhythm: Normal rate and regular rhythm.     Pulses: Normal pulses.     Heart sounds: Normal heart sounds.  Pulmonary:     Effort: Pulmonary effort is normal.     Breath sounds: Normal breath sounds.  Musculoskeletal:     Cervical back: Normal range of motion  and neck supple.     Comments: Normal Muscle Bulk and Muscle Testing Reveals:  Upper Extremities: Full ROM and Muscle Strength  5/5 Thoracic Paraspinal Tenderness: T-7- T-9 Lumbar Hypersensitivity Lower Extremities: Full ROM and Muscle Strength 5/5  Arises from Table Slowly using Cane for support Narrow Based Gait     Skin:    General: Skin is warm and dry.  Neurological:     Mental Status: He is alert and oriented to person, place, and time.  Psychiatric:        Mood and Affect: Mood normal.        Behavior: Behavior normal.         Assessment & Plan:  1. Lumbar postlaminectomy syndrome status post lumbar fusion x3: His most recent surgery was 03/14/2013. He had hardware removal and bone allograft waist and L2-3.  Continue to Monitor. Continue current Medication Regimen.01/20/2022 Refilled: oxyCODONE 7.5/'325mg'$  one tablet every 4 hours as needed #100 and :Oxycontin 20 mg one tablet every 12 hours #60.We will continue the opioid monitoring program, this consists of regular clinic visits, examinations, urine drug screen, pill counts as well as use of [place] Controlled Substance Reporting system. A 12 month History has been reviewed on the [place] on 01/20/2022.  2. Lumbar Radiculopathy: Continue current medication regimen with Lyrica. 01/20/2022 3. Insomnia: Continue current medication regimen with Trazodone.01/20/2022 4. Nausea: Continue Zofran as needed.Oncology Following.  01/20/2022 5. Cervical Spondylosis/ Cervicalgia/ Cervical Radiculitis:  No complaints today. Continue current medication regimen with  Lyrica. 01/20/2022 6. Cervical Myofascial Pain Syndrome: Continue current treatment regimen. Continue to monitor. 01/20/2022. 7. Paresthesia: Continue Lyrica: Continue to Monitor.  01/20/2022.   F/U in 1 month

## 2022-01-22 ENCOUNTER — Encounter: Payer: Self-pay | Admitting: Registered Nurse

## 2022-01-25 ENCOUNTER — Ambulatory Visit (HOSPITAL_BASED_OUTPATIENT_CLINIC_OR_DEPARTMENT_OTHER)
Admission: RE | Admit: 2022-01-25 | Discharge: 2022-01-25 | Disposition: A | Discharge status: Home / Self Care | Payer: Medicare HMO | Source: Ambulatory Visit | Attending: Hematology & Oncology | Admitting: Hematology & Oncology

## 2022-01-25 ENCOUNTER — Ambulatory Visit (HOSPITAL_BASED_OUTPATIENT_CLINIC_OR_DEPARTMENT_OTHER): Payer: Medicare HMO

## 2022-01-25 ENCOUNTER — Inpatient Hospital Stay: Payer: Medicare HMO

## 2022-01-25 VITALS — BP 149/67 | HR 75 | Temp 97.6°F | Resp 17

## 2022-01-25 DIAGNOSIS — K909 Intestinal malabsorption, unspecified: Secondary | ICD-10-CM

## 2022-01-25 DIAGNOSIS — R911 Solitary pulmonary nodule: Secondary | ICD-10-CM | POA: Insufficient documentation

## 2022-01-25 DIAGNOSIS — D500 Iron deficiency anemia secondary to blood loss (chronic): Secondary | ICD-10-CM

## 2022-01-25 MED ORDER — IOHEXOL 300 MG/ML  SOLN
75.0000 mL | Freq: Once | INTRAMUSCULAR | Status: AC | PRN
  Administered 2022-01-25: 75 mL via INTRAVENOUS

## 2022-01-25 MED ORDER — SODIUM CHLORIDE 0.9 % IV SOLN
300.0000 mg | Freq: Once | INTRAVENOUS | Status: AC
  Administered 2022-01-25: 300 mg via INTRAVENOUS
  Filled 2022-01-25: qty 300

## 2022-01-25 MED ORDER — SODIUM CHLORIDE 0.9 % IV SOLN: INTRAVENOUS | Status: DC

## 2022-01-25 NOTE — Patient Instructions (Signed)

## 2022-01-27 ENCOUNTER — Encounter: Payer: Self-pay | Admitting: Hematology & Oncology

## 2022-01-27 ENCOUNTER — Telehealth: Payer: Self-pay | Admitting: *Deleted

## 2022-01-27 NOTE — Telephone Encounter (Signed)
As noted below by Dr. Marin Olp, I informed the patient that the CT scan does not show any evidence of cancer in the lungs. He verbalized understanding.

## 2022-01-27 NOTE — Telephone Encounter (Signed)
-----   Message from [name], MD sent at 01/27/2022 12:49 PM EST ----- Call and let him know that the CT scan does not show any evidence of cancer in the lungs.  Thanks.  [name]

## 2022-02-01 ENCOUNTER — Encounter: Payer: Self-pay | Admitting: Hematology & Oncology

## 2022-02-01 ENCOUNTER — Inpatient Hospital Stay: Payer: Medicare PPO | Attending: Hematology & Oncology

## 2022-02-01 VITALS — BP 140/69 | HR 70 | Temp 97.6°F | Resp 16

## 2022-02-01 DIAGNOSIS — D500 Iron deficiency anemia secondary to blood loss (chronic): Secondary | ICD-10-CM | POA: Diagnosis not present

## 2022-02-01 DIAGNOSIS — K909 Intestinal malabsorption, unspecified: Secondary | ICD-10-CM | POA: Insufficient documentation

## 2022-02-01 MED ORDER — SODIUM CHLORIDE 0.9 % IV SOLN: INTRAVENOUS | Status: DC

## 2022-02-01 MED ORDER — SODIUM CHLORIDE 0.9 % IV SOLN
300.0000 mg | Freq: Once | INTRAVENOUS | Status: AC
  Administered 2022-02-01: 300 mg via INTRAVENOUS
  Filled 2022-02-01: qty 300

## 2022-02-01 NOTE — Patient Instructions (Signed)

## 2022-02-11 ENCOUNTER — Encounter: Payer: Self-pay | Admitting: Hematology & Oncology

## 2022-02-18 ENCOUNTER — Encounter: Payer: Medicare PPO | Attending: Physical Medicine and Rehabilitation | Admitting: Registered Nurse

## 2022-02-18 VITALS — BP 155/75 | HR 85 | Ht 65.0 in | Wt 114.2 lb

## 2022-02-18 DIAGNOSIS — Z5181 Encounter for therapeutic drug level monitoring: Secondary | ICD-10-CM | POA: Diagnosis present

## 2022-02-18 DIAGNOSIS — M961 Postlaminectomy syndrome, not elsewhere classified: Secondary | ICD-10-CM | POA: Diagnosis not present

## 2022-02-18 DIAGNOSIS — Z79891 Long term (current) use of opiate analgesic: Secondary | ICD-10-CM

## 2022-02-18 DIAGNOSIS — R202 Paresthesia of skin: Secondary | ICD-10-CM

## 2022-02-18 DIAGNOSIS — Z9889 Other specified postprocedural states: Secondary | ICD-10-CM | POA: Diagnosis not present

## 2022-02-18 DIAGNOSIS — G8929 Other chronic pain: Secondary | ICD-10-CM

## 2022-02-18 DIAGNOSIS — M545 Low back pain, unspecified: Secondary | ICD-10-CM

## 2022-02-18 DIAGNOSIS — M546 Pain in thoracic spine: Secondary | ICD-10-CM | POA: Diagnosis not present

## 2022-02-18 DIAGNOSIS — G894 Chronic pain syndrome: Secondary | ICD-10-CM

## 2022-02-18 MED ORDER — OXYCODONE-ACETAMINOPHEN 7.5-325 MG PO TABS: 1.0000 | ORAL_TABLET | ORAL | 0 refills | Status: DC | PRN

## 2022-02-18 MED ORDER — OXYCODONE HCL ER 20 MG PO T12A: EXTENDED_RELEASE_TABLET | ORAL | 0 refills | Status: DC

## 2022-02-18 NOTE — Progress Notes (Signed)
Subjective:    Patient ID: Bruce Olson, male    DOB: 06/06/1955, 67 y.o.   MRN: [id number]  HPI: MICHEAL MURAD is a 67 y.o. male who returns for follow up appointment for chronic pain and medication refill. He states his pain is located in his mid- lower back and right lower extremity with numbness and tingling. He rates his pain 7. His current exercise regime is walking and performing stretching exercises.  Ms. Hammitt Morphine equivalent is 101.50 MME.   Last UDS was Performed 11/22/2021, it was consistent.    Pain Inventory Average Pain 7 Pain Right Now 7 My pain is sharp, stabbing, and tingling  In the last 24 hours, has pain interfered with the following? General activity 7 Relation with others 7 Enjoyment of life 7 What TIME of day is your pain at its worst? morning , daytime, evening, and night Sleep (in general) Poor  Pain is worse with: walking, sitting, and standing Pain improves with: heat/ice and TENS Relief from Meds: 3  Family History  Problem Relation Age of Onset   Anesthesia problems Neg Hx    Hypotension Neg Hx    Malignant hyperthermia Neg Hx    Pseudochol deficiency Neg Hx    Social History   Socioeconomic History   Marital status: Single    Spouse name: Not on file   Number of children: Not on file   Years of education: Not on file   Highest education level: Not on file  Occupational History   Not on file  Tobacco Use   Smoking status: Every Day    Packs/day: 1.00    Years: 33.00    Total pack years: 33.00    Types: Cigarettes   Smokeless tobacco: Never  Vaping Use   Vaping Use: Never used  Substance and Sexual Activity   Alcohol use: No   Drug use: No   Sexual activity: Never    Comment: back problems  Other Topics Concern   Not on file  Social History Narrative   Not on file   Social Determinants of Health   Financial Resource Strain: Not on file  Food Insecurity: Not on file  Transportation Needs: Not on file   Physical Activity: Not on file  Stress: Not on file  Social Connections: Not on file   Past Surgical History:  Procedure Laterality Date   BACK SURGERY  2013   Back surgery  (fusion)09/15/2010 & 2013   BACK SURGERY  03-2013   HARDWARE REMOVAL N/A 03/14/2013   Procedure: HARDWARE REMOVAL;  Surgeon: Eustace Moore, MD;  Location: [facility];  Service: Neurosurgery;  Laterality: N/A;   Past Surgical History:  Procedure Laterality Date   BACK SURGERY  2013   Back surgery  (fusion)09/15/2010 & 2013   BACK SURGERY  03-2013   HARDWARE REMOVAL N/A 03/14/2013   Procedure: HARDWARE REMOVAL;  Surgeon: Eustace Moore, MD;  Location: [place] [facility];  Service: Neurosurgery;  Laterality: N/A;   Past Medical History:  Diagnosis Date   Goals of care, counseling/discussion 04/07/2016   Iron deficiency anemia due to chronic blood loss 10/21/2016   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  Opioid Risk Score:   Fall Risk Score:  `1  Depression screen [facility] 2/9  10/01/2021   12:54 PM 08/31/2021   12:53 PM 08/05/2021    1:17 PM 06/03/2021    1:02 PM 05/10/2021    1:11 PM 04/06/2021   12:51 PM 03/12/2021    1:00 PM  Depression screen PHQ 2/9  Decreased Interest 0 0 0 0 0 0 0  Down, Depressed, Hopeless 0 0 0 0 0 0 0  PHQ - 2 Score 0 0 0 0 0 0 0      Review of Systems  Musculoskeletal:  Positive for back pain.       Right thigh pain  All other systems reviewed and are negative.     Objective:   Physical Exam Vitals and nursing note reviewed.  Constitutional:      Appearance: Normal appearance.  Cardiovascular:     Rate and Rhythm: Normal rate and regular rhythm.     Pulses: Normal pulses.     Heart sounds: Normal heart sounds.  Pulmonary:     Effort: Pulmonary effort is normal.     Breath sounds: Normal breath sounds.   Musculoskeletal:     Cervical back: Normal range of motion and neck supple.     Comments: Normal Muscle Bulk and Muscle Testing Reveals:  Upper Extremities: Full ROM and Muscle Strength 5/5  Thoracic and Lumbar Hypersensitivity Lower Extremities : Full ROM and Muscle Strength 5/5 Arises from Table slowly using cane for support Antalgic Gait     Skin:    General: Skin is warm and dry.  Neurological:     Mental Status: He is alert and oriented to person, place, and time.  Psychiatric:        Mood and Affect: Mood normal.        Behavior: Behavior normal.         Assessment & Plan:  1. Lumbar postlaminectomy syndrome status post lumbar fusion x3: His most recent surgery was 03/14/2013. He had hardware removal and bone allograft waist and L2-3.  Continue to Monitor. Continue current Medication Regimen.02/18/2022 Refilled: oxyCODONE 7.5/'325mg'$  one tablet every 4 hours as needed #100 and :Oxycontin 20 mg one tablet every 12 hours #60.We will continue the opioid monitoring program, this consists of regular clinic visits, examinations, urine drug screen, pill counts as well as use of [place] Controlled Substance Reporting system. A 12 month History has been reviewed on the [place] on 02/18/2022.  2. Lumbar Radiculopathy: Continue current medication regimen with Lyrica. 02/18/2022 3. Insomnia: Continue current medication regimen with Trazodone.02/18/2022 4. Nausea: Continue Zofran as needed.Oncology Following.  02/18/2022 5. Cervical Spondylosis/ Cervicalgia/ Cervical Radiculitis:  No complaints today. Continue current medication regimen with  Lyrica. 02/18/2022 6. Cervical Myofascial Pain Syndrome: Continue current treatment regimen. Continue to monitor. 02/18/2022. 7. Paresthesia: Continue Lyrica: Continue to Monitor.  02/18/2022.   F/U in 1 month

## 2022-02-20 ENCOUNTER — Encounter: Payer: Self-pay | Admitting: Registered Nurse

## 2022-03-18 ENCOUNTER — Other Ambulatory Visit: Payer: Self-pay | Admitting: Hematology & Oncology

## 2022-03-18 DIAGNOSIS — C090 Malignant neoplasm of tonsillar fossa: Secondary | ICD-10-CM

## 2022-03-21 ENCOUNTER — Encounter: Payer: Medicare PPO | Admitting: Registered Nurse

## 2022-03-21 ENCOUNTER — Telehealth: Payer: Self-pay

## 2022-03-21 MED ORDER — OXYCODONE HCL ER 20 MG PO T12A: EXTENDED_RELEASE_TABLET | ORAL | 0 refills | Status: DC

## 2022-03-21 MED ORDER — OXYCODONE-ACETAMINOPHEN 7.5-325 MG PO TABS: 1.0000 | ORAL_TABLET | ORAL | 0 refills | Status: DC | PRN

## 2022-03-21 NOTE — Telephone Encounter (Signed)
Bruce Olson is out today, not feeling well,  Patient needs refill on oxycodone 20 mg and percocet 7.5-325 mg, he will be out tomorrow last fill per pmp was 02/18/22   Filled  Written  ID  Drug  QTY  Days  Prescriber  RX #  Dispenser  Refill  Daily Dose*  Pymt Type  PMP  03/09/2022 11/22/2021 1  Pregabalin 100 Mg Capsule 90.00 30 Eu Tho [id number] Arc ([id number]) 3/5 2.01 LME Medicare [place] 02/18/2022 02/18/2022 1  Oxycodone-Acetaminophn 7.5-325 100.00 25 Eu Tho [id number] Arc ([id number]) 0/0 45.00 MME Medicare [place] 02/18/2022 02/18/2022 1  Oxycodone Hcl Er 20 Mg Tablet 60.00 30 Eu Tho [id number] Arc ([id number]) 0/0 60.00 MME Medicare 

## 2022-03-28 ENCOUNTER — Encounter: Payer: Medicare PPO | Attending: Physical Medicine and Rehabilitation | Admitting: Registered Nurse

## 2022-03-28 ENCOUNTER — Encounter: Payer: Self-pay | Admitting: Registered Nurse

## 2022-03-28 VITALS — BP 143/72 | HR 81 | Ht 65.0 in | Wt 114.0 lb

## 2022-03-28 DIAGNOSIS — M545 Low back pain, unspecified: Secondary | ICD-10-CM | POA: Diagnosis not present

## 2022-03-28 DIAGNOSIS — Z9889 Other specified postprocedural states: Secondary | ICD-10-CM | POA: Diagnosis not present

## 2022-03-28 DIAGNOSIS — R202 Paresthesia of skin: Secondary | ICD-10-CM | POA: Diagnosis present

## 2022-03-28 DIAGNOSIS — M546 Pain in thoracic spine: Secondary | ICD-10-CM | POA: Diagnosis not present

## 2022-03-28 DIAGNOSIS — M961 Postlaminectomy syndrome, not elsewhere classified: Secondary | ICD-10-CM | POA: Diagnosis not present

## 2022-03-28 DIAGNOSIS — R0902 Hypoxemia: Secondary | ICD-10-CM

## 2022-03-28 DIAGNOSIS — Z5181 Encounter for therapeutic drug level monitoring: Secondary | ICD-10-CM | POA: Insufficient documentation

## 2022-03-28 DIAGNOSIS — G894 Chronic pain syndrome: Secondary | ICD-10-CM | POA: Diagnosis present

## 2022-03-28 DIAGNOSIS — G8929 Other chronic pain: Secondary | ICD-10-CM | POA: Diagnosis present

## 2022-03-28 DIAGNOSIS — Z79891 Long term (current) use of opiate analgesic: Secondary | ICD-10-CM | POA: Diagnosis present

## 2022-03-28 NOTE — Progress Notes (Unsigned)
Subjective:    Patient ID: Bruce Olson, male    DOB: 05-Mar-1955, 67 y.o.   MRN: [id number]  HPI: Bruce Olson is a 67 y.o. male who returns for follow up appointment for chronic pain and medication refill. He states his pain is located in his lower back. He rates his pain 7. His current exercise regime is walking and performing stretching exercises.  Bruce Olson arrived to office with oxygen desaturation, O2 sat was rechecked, he refused ED evaluation.   Bruce Olson Morphine equivalent is 105.00 MME.   Last UDS was Performed on 11/22/2021, it was consistent.     Pain Inventory Average Pain 7 Pain Right Now 7 My pain is sharp, stabbing, and tingling  In the last 24 hours, has pain interfered with the following? General activity 7 Relation with others 7 Enjoyment of life 7 What TIME of day is your pain at its worst? morning , daytime, evening, and night Sleep (in general) Poor  Pain is worse with: walking, sitting, and standing Pain improves with: heat/ice, medication, and TENS Relief from Meds: 3  Family History  Problem Relation Age of Onset   Anesthesia problems Neg Hx    Hypotension Neg Hx    Malignant hyperthermia Neg Hx    Pseudochol deficiency Neg Hx    Social History   Socioeconomic History   Marital status: Single    Spouse name: Not on file   Number of children: Not on file   Years of education: Not on file   Highest education level: Not on file  Occupational History   Not on file  Tobacco Use   Smoking status: Every Day    Packs/day: 1.00    Years: 33.00    Total pack years: 33.00    Types: Cigarettes   Smokeless tobacco: Never  Vaping Use   Vaping Use: Never used  Substance and Sexual Activity   Alcohol use: No   Drug use: No   Sexual activity: Never    Comment: back problems  Other Topics Concern   Not on file  Social History Narrative   Not on file   Social Determinants of Health   Financial Resource Strain: Not on file   Food Insecurity: Not on file  Transportation Needs: Not on file  Physical Activity: Not on file  Stress: Not on file  Social Connections: Not on file   Past Surgical History:  Procedure Laterality Date   BACK SURGERY  2013   Back surgery  (fusion)09/15/2010 & 2013   BACK SURGERY  03-2013   HARDWARE REMOVAL N/A 03/14/2013   Procedure: HARDWARE REMOVAL;  Surgeon: Eustace Moore, MD;  Location: [facility];  Service: Neurosurgery;  Laterality: N/A;   Past Surgical History:  Procedure Laterality Date   BACK SURGERY  2013   Back surgery  (fusion)09/15/2010 & 2013   BACK SURGERY  03-2013   HARDWARE REMOVAL N/A 03/14/2013   Procedure: HARDWARE REMOVAL;  Surgeon: Eustace Moore, MD;  Location: [place] [facility];  Service: Neurosurgery;  Laterality: N/A;   Past Medical History:  Diagnosis Date   Goals of care, counseling/discussion 04/07/2016   Iron deficiency anemia due to chronic blood loss 10/21/2016   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  Opioid Risk Score:  Fall Risk Score:  `1  Depression screen [facility] 2/9     10/01/2021   12:54 PM 08/31/2021   12:53 PM 08/05/2021    1:17 PM 06/03/2021    1:02 PM 05/10/2021    1:11 PM 04/06/2021   12:51 PM 03/12/2021    1:00 PM  Depression screen PHQ 2/9  Decreased Interest 0 0 0 0 0 0 0  Down, Depressed, Hopeless 0 0 0 0 0 0 0  PHQ - 2 Score 0 0 0 0 0 0 0     Review of Systems  Musculoskeletal:  Positive for back pain.       RT leg       Objective:   Physical Exam Vitals and nursing note reviewed.  Constitutional:      Appearance: Normal appearance.  Cardiovascular:     Rate and Rhythm: Normal rate and regular rhythm.     Pulses: Normal pulses.     Heart sounds: Normal heart sounds.  Pulmonary:     Effort: Pulmonary effort is normal.     Breath sounds: Normal breath sounds.   Musculoskeletal:     Cervical back: Normal range of motion and neck supple.     Comments: Normal Muscle Bulk and Muscle Testing Reveals:  Upper Extremities: Full ROM and Muscle Strength 5/5  Lumbar Paraspinal Tenderness: L-4-L-5 Lower Extremities: Full ROM and Muscle Strength 5/5 Arises from Table slowly using cane for support Narrow Based  Gait     Skin:    General: Skin is warm and dry.  Neurological:     Mental Status: He is alert and oriented to person, place, and time.  Psychiatric:        Mood and Affect: Mood normal.        Behavior: Behavior normal.         Assessment & Plan:  1. Lumbar postlaminectomy syndrome status post lumbar fusion x3: His most recent surgery was 03/14/2013. He had hardware removal and bone allograft waist and L2-3.  Continue to Monitor. Continue current Medication Regimen.03/28/2022 Refilled: oxyCODONE 7.5/'325mg'$  one tablet every 4 hours as needed #100 and :Oxycontin 20 mg one tablet every 12 hours #60.We will continue the opioid monitoring program, this consists of regular clinic visits, examinations, urine drug screen, pill counts as well as use of [place] Controlled Substance Reporting system. A 12 month History has been reviewed on the [place] on 03/28/2022.  2. Lumbar Radiculopathy: Continue current medication regimen with Lyrica. 03/28/2022 3. Insomnia: Continue current medication regimen with Trazodone.03/28/2022 4. Nausea: Continue Zofran as needed.Oncology Following.  03/28/2022 5. Cervical Spondylosis/ Cervicalgia/ Cervical Radiculitis:  No complaints today. Continue current medication regimen with  Lyrica. 03/28/2022 6. Cervical Myofascial Pain Syndrome: Continue current treatment regimen. Continue to monitor. 03/28/2022. 7. Paresthesia: Continue Lyrica: Continue to Monitor.  03/28/2022. 8. Oxygen Desaturation: O2 sat was re-checked. Bruce Olson refuses ED evaluation. He will keep a Oxygen  saturation log and follow up with with his PCP.   F/U in 1 month

## 2022-04-14 ENCOUNTER — Encounter: Payer: Medicare PPO | Attending: Physical Medicine and Rehabilitation | Admitting: Registered Nurse

## 2022-04-14 ENCOUNTER — Encounter: Payer: Self-pay | Admitting: Registered Nurse

## 2022-04-14 VITALS — BP 154/70 | HR 83 | Ht 65.0 in | Wt 111.0 lb

## 2022-04-14 DIAGNOSIS — G894 Chronic pain syndrome: Secondary | ICD-10-CM | POA: Diagnosis not present

## 2022-04-14 DIAGNOSIS — G8929 Other chronic pain: Secondary | ICD-10-CM

## 2022-04-14 DIAGNOSIS — Z9889 Other specified postprocedural states: Secondary | ICD-10-CM | POA: Insufficient documentation

## 2022-04-14 DIAGNOSIS — Z5181 Encounter for therapeutic drug level monitoring: Secondary | ICD-10-CM | POA: Insufficient documentation

## 2022-04-14 DIAGNOSIS — M961 Postlaminectomy syndrome, not elsewhere classified: Secondary | ICD-10-CM | POA: Diagnosis not present

## 2022-04-14 DIAGNOSIS — M545 Low back pain, unspecified: Secondary | ICD-10-CM | POA: Diagnosis present

## 2022-04-14 DIAGNOSIS — Z79891 Long term (current) use of opiate analgesic: Secondary | ICD-10-CM | POA: Insufficient documentation

## 2022-04-14 MED ORDER — OXYCODONE HCL ER 20 MG PO T12A: EXTENDED_RELEASE_TABLET | ORAL | 0 refills | Status: DC

## 2022-04-14 MED ORDER — OXYCODONE-ACETAMINOPHEN 7.5-325 MG PO TABS: 1.0000 | ORAL_TABLET | ORAL | 0 refills | Status: DC | PRN

## 2022-04-14 NOTE — Progress Notes (Signed)
Subjective:    Patient ID: Bruce Olson, male    DOB: 1955-10-06, 67 y.o.   MRN: [id number]  HPI: Bruce Olson is a 67 y.o. male who returns for follow up appointment for chronic pain and medication refill. He states his pain is located in his lower back.Marland KitchenHe rates his pain 7. His current exercise regime is walking and performing stretching exercises.  Bruce Olson arrived to office hypertensive, blood pressure was re-check, medication list was reviewed. He is not prescribed anti-hypertensive medication, he will keep a blood pressure log and F/U with his PCP.   Bruce Olson Morphine equivalent is 105.00 MME.   UDS ordered today.      Pain Inventory Average Pain 7 Pain Right Now 7 My pain is intermittent, sharp, stabbing, tingling, and aching  In the last 24 hours, has pain interfered with the following? General activity 7 Relation with others 7 Enjoyment of life 7 What TIME of day is your pain at its worst? morning , daytime, evening, and night Sleep (in general) Poor  Pain is worse with: walking, sitting, and standing Pain improves with: rest, therapy/exercise, medication, and TENS Relief from Meds: 3  Family History  Problem Relation Age of Onset   Anesthesia problems Neg Hx    Hypotension Neg Hx    Malignant hyperthermia Neg Hx    Pseudochol deficiency Neg Hx    Social History   Socioeconomic History   Marital status: Single    Spouse name: Not on file   Number of children: Not on file   Years of education: Not on file   Highest education level: Not on file  Occupational History   Not on file  Tobacco Use   Smoking status: Every Day    Packs/day: 1.00    Years: 33.00    Additional pack years: 0.00    Total pack years: 33.00    Types: Cigarettes   Smokeless tobacco: Never  Vaping Use   Vaping Use: Never used  Substance and Sexual Activity   Alcohol use: No   Drug use: No   Sexual activity: Never    Comment: back problems  Other Topics  Concern   Not on file  Social History Narrative   Not on file   Social Determinants of Health   Financial Resource Strain: Not on file  Food Insecurity: Not on file  Transportation Needs: Not on file  Physical Activity: Not on file  Stress: Not on file  Social Connections: Not on file   Past Surgical History:  Procedure Laterality Date   BACK SURGERY  2013   Back surgery  (fusion)09/15/2010 & 2013   BACK SURGERY  03-2013   HARDWARE REMOVAL N/A 03/14/2013   Procedure: HARDWARE REMOVAL;  Surgeon: Eustace Moore, MD;  Location: [facility];  Service: Neurosurgery;  Laterality: N/A;   Past Surgical History:  Procedure Laterality Date   BACK SURGERY  2013   Back surgery  (fusion)09/15/2010 & 2013   BACK SURGERY  03-2013   HARDWARE REMOVAL N/A 03/14/2013   Procedure: HARDWARE REMOVAL;  Surgeon: Eustace Moore, MD;  Location: [place] [facility];  Service: Neurosurgery;  Laterality: N/A;   Past Medical History:  Diagnosis Date   Goals of care, counseling/discussion 04/07/2016   Iron deficiency anemia due to chronic blood loss 10/21/2016   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  Opioid Risk Score:   Fall Risk Score:  `1  Depression screen PHQ 2/9     03/28/2022    1:16 PM 10/01/2021   12:54 PM 08/31/2021   12:53 PM 08/05/2021    1:17 PM 06/03/2021    1:02 PM 05/10/2021    1:11 PM 04/06/2021   12:51 PM  Depression screen PHQ 2/9  Decreased Interest 0 0 0 0 0 0 0  Down, Depressed, Hopeless 0 0 0 0 0 0 0  PHQ - 2 Score 0 0 0 0 0 0 0    Review of Systems  Gastrointestinal:  Positive for vomiting.  Musculoskeletal:  Positive for gait problem.       Right leg pain  All other systems reviewed and are negative.      Objective:   Physical Exam Vitals and nursing note reviewed.  Constitutional:      Appearance: Normal  appearance.  Cardiovascular:     Rate and Rhythm: Normal rate and regular rhythm.     Pulses: Normal pulses.     Heart sounds: Normal heart sounds.  Pulmonary:     Effort: Pulmonary effort is normal.     Breath sounds: Normal breath sounds.  Musculoskeletal:     Cervical back: Normal range of motion and neck supple.     Comments: Normal Muscle Bulk and Muscle Testing Reveals:  Upper Extremities: Full ROM and Muscle Strength 5/5 Lumbar Paraspinal Tenderness:  L-3-L-5 Lower Extremities : Fu;ll ROM and Muscle Strength 5/5 Arises from Table slowly using cane for support Narrow Based Gait     Skin:    General: Skin is warm and dry.  Neurological:     Mental Status: He is alert and oriented to person, place, and time.  Psychiatric:        Mood and Affect: Mood normal.        Behavior: Behavior normal.         Assessment & Plan:  1. Lumbar postlaminectomy syndrome status post lumbar fusion x3: His most recent surgery was 03/14/2013. He had hardware removal and bone allograft waist and L2-3.  Continue to Monitor. Continue current Medication Regimen.04/14/2022 Refilled: oxyCODONE 7.5/'325mg'$  one tablet every 4 hours as needed #100 and :Oxycontin 20 mg one tablet every 12 hours #60.We will continue the opioid monitoring program, this consists of regular clinic visits, examinations, urine drug screen, pill counts as well as use of [place] Controlled Substance Reporting system. A 12 month History has been reviewed on the [place] on 04/14/2022.  2. Lumbar Radiculopathy: Continue current medication regimen with Lyrica. 04/14/2022 3. Insomnia: Continue current medication regimen with Trazodone.04/14/2022 4. Nausea: Continue Zofran as needed.Oncology Following.  04/14/2022 5. Cervical Spondylosis/ Cervicalgia/ Cervical Radiculitis:  No complaints today. Continue current medication regimen with  Lyrica. 04/14/2022 6. Cervical Myofascial Pain  Syndrome: Continue current treatment regimen. Continue to monitor. 04/14/2022. 7. Paresthesia: Continue Lyrica: Continue to Monitor.  04/14/2022. 8. Hypertension: Blood Pressure was re-checked, he's not prescribed anti-hypertensive medication. He will keep a blood pressure log and F/U with his PCP  F/U in 1 month

## 2022-04-18 ENCOUNTER — Telehealth: Payer: Self-pay | Admitting: *Deleted

## 2022-04-18 NOTE — Telephone Encounter (Signed)
Called patient and left vm Oxycodone required a PA which has been submitted.

## 2022-04-18 NOTE — Telephone Encounter (Signed)
PA submitted to [facility] for Oxycodone 20 mg via fax.

## 2022-04-19 ENCOUNTER — Telehealth: Payer: Self-pay | Admitting: *Deleted

## 2022-04-19 ENCOUNTER — Ambulatory Visit: Payer: Medicare PPO | Admitting: Registered Nurse

## 2022-04-19 MED ORDER — XTAMPZA ER 18 MG PO C12A: 1.0000 | EXTENDED_RELEASE_CAPSULE | Freq: Two times a day (BID) | ORAL | 0 refills | Status: DC

## 2022-04-19 NOTE — Telephone Encounter (Signed)
PA for Oxycodone denied because drug covered is Xtampza ER capsule sprinkle. Or can appeal as to why Xtampza would not work as well.Marland Kitchen

## 2022-04-19 NOTE — Telephone Encounter (Signed)
Oxycontin not covered by his insurance.  Xtampza e-scribed today.  Call placed to Mr. Eisenreich regarding the above, he verbalizes understanding.

## 2022-04-20 LAB — TOXASSURE SELECT,+ANTIDEPR,UR

## 2022-05-06 ENCOUNTER — Telehealth: Payer: Self-pay | Admitting: *Deleted

## 2022-05-06 NOTE — Telephone Encounter (Signed)
Urine drug screen for this encounter is consistent for prescribed medication 

## 2022-05-09 ENCOUNTER — Other Ambulatory Visit: Payer: Self-pay | Admitting: Registered Nurse

## 2022-05-10 NOTE — Telephone Encounter (Signed)
Refill for Lyrica: too early

## 2022-05-16 ENCOUNTER — Encounter: Payer: Medicare PPO | Attending: Physical Medicine and Rehabilitation | Admitting: Registered Nurse

## 2022-05-16 ENCOUNTER — Encounter: Payer: Self-pay | Admitting: Registered Nurse

## 2022-05-16 VITALS — BP 118/58 | HR 84 | Ht 65.0 in | Wt 109.0 lb

## 2022-05-16 DIAGNOSIS — G8929 Other chronic pain: Secondary | ICD-10-CM | POA: Diagnosis present

## 2022-05-16 DIAGNOSIS — Z9889 Other specified postprocedural states: Secondary | ICD-10-CM | POA: Insufficient documentation

## 2022-05-16 DIAGNOSIS — M546 Pain in thoracic spine: Secondary | ICD-10-CM | POA: Diagnosis present

## 2022-05-16 DIAGNOSIS — Z5181 Encounter for therapeutic drug level monitoring: Secondary | ICD-10-CM | POA: Diagnosis present

## 2022-05-16 DIAGNOSIS — M545 Low back pain, unspecified: Secondary | ICD-10-CM | POA: Insufficient documentation

## 2022-05-16 DIAGNOSIS — G894 Chronic pain syndrome: Secondary | ICD-10-CM | POA: Insufficient documentation

## 2022-05-16 DIAGNOSIS — R202 Paresthesia of skin: Secondary | ICD-10-CM | POA: Insufficient documentation

## 2022-05-16 DIAGNOSIS — Z79891 Long term (current) use of opiate analgesic: Secondary | ICD-10-CM | POA: Diagnosis present

## 2022-05-16 DIAGNOSIS — M961 Postlaminectomy syndrome, not elsewhere classified: Secondary | ICD-10-CM | POA: Diagnosis present

## 2022-05-16 DIAGNOSIS — R634 Abnormal weight loss: Secondary | ICD-10-CM | POA: Insufficient documentation

## 2022-05-16 MED ORDER — PREGABALIN 100 MG PO CAPS: 100.0000 mg | ORAL_CAPSULE | Freq: Three times a day (TID) | ORAL | 5 refills | Status: DC

## 2022-05-16 MED ORDER — XTAMPZA ER 18 MG PO C12A: 1.0000 | EXTENDED_RELEASE_CAPSULE | Freq: Two times a day (BID) | ORAL | 0 refills | Status: DC

## 2022-05-16 MED ORDER — OXYCODONE-ACETAMINOPHEN 7.5-325 MG PO TABS: 1.0000 | ORAL_TABLET | ORAL | 0 refills | Status: DC | PRN

## 2022-05-16 NOTE — Progress Notes (Signed)
Subjective:    Patient ID: Bruce Olson, male    DOB: 04-01-1955, 67 y.o.   MRN: [id number]  HPI: Bruce Olson is a 67 y.o. male who returns for follow up appointment for chronic pain and medication refill. He states his pain is located in his mid- lower back and right lower extremity with tingling and numbness. He rates his pain 7. His current exercise regime is walking and performing stretching exercises.  Bruce Olson Morphine equivalent is 121.50 MME.   Last UDS was Performed on 04/14/2022, it was consistent.    Pain Inventory Average Pain 7 Pain Right Now 7 My pain is sharp and stabbing  In the last 24 hours, has pain interfered with the following? General activity 7 Relation with others 7 Enjoyment of life 7 What TIME of day is your pain at its worst? morning , daytime, evening, and night Sleep (in general) Poor  Pain is worse with: walking, sitting, and standing Pain improves with: heat/ice, medication, and TENS Relief from Meds: 3  Family History  Problem Relation Age of Onset   Anesthesia problems Neg Hx    Hypotension Neg Hx    Malignant hyperthermia Neg Hx    Pseudochol deficiency Neg Hx    Social History   Socioeconomic History   Marital status: Single    Spouse name: Not on file   Number of children: Not on file   Years of education: Not on file   Highest education level: Not on file  Occupational History   Not on file  Tobacco Use   Smoking status: Every Day    Packs/day: 1.00    Years: 33.00    Additional pack years: 0.00    Total pack years: 33.00    Types: Cigarettes   Smokeless tobacco: Never  Vaping Use   Vaping Use: Never used  Substance and Sexual Activity   Alcohol use: No   Drug use: No   Sexual activity: Never    Comment: back problems  Other Topics Concern   Not on file  Social History Narrative   Not on file   Social Determinants of Health   Financial Resource Strain: Not on file  Food Insecurity: Not on file   Transportation Needs: Not on file  Physical Activity: Not on file  Stress: Not on file  Social Connections: Not on file   Past Surgical History:  Procedure Laterality Date   BACK SURGERY  2013   Back surgery  (fusion)09/15/2010 & 2013   BACK SURGERY  03-2013   HARDWARE REMOVAL N/A 03/14/2013   Procedure: HARDWARE REMOVAL;  Surgeon: Tia Alert, MD;  Location: [facility];  Service: Neurosurgery;  Laterality: N/A;   Past Surgical History:  Procedure Laterality Date   BACK SURGERY  2013   Back surgery  (fusion)09/15/2010 & 2013   BACK SURGERY  03-2013   HARDWARE REMOVAL N/A 03/14/2013   Procedure: HARDWARE REMOVAL;  Surgeon: Tia Alert, MD;  Location: [facility];  Service: Neurosurgery;  Laterality: N/A;   Past Medical History:  Diagnosis Date   Goals of care, counseling/discussion 04/07/2016   Iron deficiency anemia due to chronic blood loss 10/21/2016   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   No pertinent past medical history    Occupational injury 2013   resulted in back injury that led to surgery   Thyroid cancer    BP (!) 145/74   Pulse 84   Ht 5\' 5"  (1.651 m)   Wt 109  lb (49.4 kg)   SpO2 91%   BMI 18.14 kg/m   Opioid Risk Score:   Fall Risk Score:  `1  Depression screen [facility] 2/9     04/14/2022   12:57 PM 03/28/2022    1:16 PM 10/01/2021   12:54 PM 08/31/2021   12:53 PM 08/05/2021    1:17 PM 06/03/2021    1:02 PM 05/10/2021    1:11 PM  Depression screen [facility] 2/9  Decreased Interest 0 0 0 0 0 0 0  Down, Depressed, Hopeless 0 0 0 0 0 0 0  PHQ - 2 Score 0 0 0 0 0 0 0      Review of Systems  Musculoskeletal:  Positive for back pain and gait problem.  All other systems reviewed and are negative.     Objective:   Physical Exam Vitals and nursing note reviewed.  Constitutional:      Appearance: Normal appearance.     Comments: Weight Loss   Cardiovascular:     Rate and Rhythm: Normal rate and regular rhythm.     Pulses: Normal pulses.     Heart sounds: Normal heart  sounds.  Pulmonary:     Effort: Pulmonary effort is normal.     Breath sounds: Normal breath sounds.  Musculoskeletal:     Cervical back: Normal range of motion and neck supple.     Comments: Normal Muscle Bulk and Muscle Testing Reveals:  Upper Extremities: Full ROM and Muscle Strength 5/5 Thoracic and Lumbar Hypersensitivity Lower Extremities: Full ROM and Muscle Strength 5/5 Arises from Table slowly using cane for support Narrow Based  Gait     Skin:    General: Skin is warm and dry.  Neurological:     Mental Status: He is alert and oriented to person, place, and time.  Psychiatric:        Mood and Affect: Mood normal.        Behavior: Behavior normal.         Assessment & Plan:  1. Lumbar postlaminectomy syndrome status post lumbar fusion x3: His most recent surgery was 03/14/2013. He had hardware removal and bone allograft waist and L2-3.  Continue to Monitor. Continue current Medication Regimen.05/16/2022 Refilled: oxyCODONE 7.5/325mg  one tablet every 4 hours as needed #100 and : Xtampza 18 mg one tablet every 12 hours #60.We will continue the opioid monitoring program, this consists of regular clinic visits, examinations, urine drug screen, pill counts as well as use of [place] Controlled Substance Reporting system. A 12 month History has been reviewed on the [place] Controlled Substance Reporting System on 05/16/2022.  2. Lumbar Radiculopathy: Continue current medication regimen with Lyrica. 05/16/2022 3. Insomnia: Continue current medication regimen with Trazodone.05/16/2022 4. Nausea: Continue Zofran as needed.Oncology Following.  05/16/2022 5. Cervical Spondylosis/ Cervicalgia/ Cervical Radiculitis:  No complaints today. Continue current medication regimen with  Lyrica. 05/16/2022 6. Cervical Myofascial Pain Syndrome: Continue current treatment regimen. Continue to monitor. 05/16/2022. 7. Paresthesia: Continue Lyrica: Continue to Monitor.  05/16/2022. 8.  Hypertension: Blood Pressure was re-checked, he's not prescribed anti-hypertensive medication. He will keep a blood pressure log and F/U with his PCP        Loss of weight

## 2022-06-15 ENCOUNTER — Encounter: Payer: Self-pay | Admitting: Registered Nurse

## 2022-06-15 ENCOUNTER — Encounter: Payer: Medicare PPO | Attending: Physical Medicine and Rehabilitation | Admitting: Registered Nurse

## 2022-06-15 VITALS — BP 138/78 | HR 82 | Ht 65.0 in | Wt 112.0 lb

## 2022-06-15 DIAGNOSIS — G8929 Other chronic pain: Secondary | ICD-10-CM | POA: Insufficient documentation

## 2022-06-15 DIAGNOSIS — G894 Chronic pain syndrome: Secondary | ICD-10-CM | POA: Diagnosis present

## 2022-06-15 DIAGNOSIS — M961 Postlaminectomy syndrome, not elsewhere classified: Secondary | ICD-10-CM | POA: Insufficient documentation

## 2022-06-15 DIAGNOSIS — R0902 Hypoxemia: Secondary | ICD-10-CM | POA: Diagnosis present

## 2022-06-15 DIAGNOSIS — Z5181 Encounter for therapeutic drug level monitoring: Secondary | ICD-10-CM | POA: Diagnosis present

## 2022-06-15 DIAGNOSIS — Z9889 Other specified postprocedural states: Secondary | ICD-10-CM

## 2022-06-15 DIAGNOSIS — M545 Low back pain, unspecified: Secondary | ICD-10-CM | POA: Diagnosis not present

## 2022-06-15 DIAGNOSIS — Z79891 Long term (current) use of opiate analgesic: Secondary | ICD-10-CM | POA: Diagnosis present

## 2022-06-15 DIAGNOSIS — R202 Paresthesia of skin: Secondary | ICD-10-CM | POA: Insufficient documentation

## 2022-06-15 MED ORDER — XTAMPZA ER 18 MG PO C12A: 1.0000 | EXTENDED_RELEASE_CAPSULE | Freq: Two times a day (BID) | ORAL | 0 refills | Status: DC

## 2022-06-15 MED ORDER — OXYCODONE-ACETAMINOPHEN 7.5-325 MG PO TABS: 1.0000 | ORAL_TABLET | ORAL | 0 refills | Status: DC | PRN

## 2022-06-15 NOTE — Progress Notes (Unsigned)
Subjective:    Patient ID: Bruce Olson, male    DOB: Jan 04, 1956, 67 y.o.   MRN: [id number]  HPI: Bruce Olson is a 67 y.o. male who returns for follow up appointment for chronic pain and medication refill. He states his pain is located in his mid- lower back and right lower extremity pain with tingling and burning. He rates his pain 7. His current exercise regime is walking and performing stretching exercises.  Bruce Olson arrived to office with Oxygen Desaturation, O2 Saturation was re-checked. Bruce Olson refuses ED or Urgent Care Evaluation. Bruce Olson brought his home Oxygen Saturation monitor ranging from 90- 92%. He denies SOB, he will F/U with his PCP, he verbalizes understanding.   Bruce Olson Morphine equivalent is 98.00 MME.   Last UDS was Performed on 04/14/2022, it was consistent.    Pain Inventory Average Pain 7 Pain Right Now 7 My pain is sharp, stabbing, and tingling  In the last 24 hours, has pain interfered with the following? General activity 7 Relation with others 7 Enjoyment of life 7 What TIME of day is your pain at its worst? morning , daytime, evening, night, and varies Sleep (in general) Poor  Pain is worse with: walking, sitting, and standing Pain improves with: heat/ice, medication, and TENS Relief from Meds: 3  Family History  Problem Relation Age of Onset   Anesthesia problems Neg Hx    Hypotension Neg Hx    Malignant hyperthermia Neg Hx    Pseudochol deficiency Neg Hx    Social History   Socioeconomic History   Marital status: Single    Spouse name: Not on file   Number of children: Not on file   Years of education: Not on file   Highest education level: Not on file  Occupational History   Not on file  Tobacco Use   Smoking status: Every Day    Packs/day: 1.00    Years: 33.00    Additional pack years: 0.00    Total pack years: 33.00    Types: Cigarettes   Smokeless tobacco: Never  Vaping Use   Vaping Use:  Never used  Substance and Sexual Activity   Alcohol use: No   Drug use: No   Sexual activity: Never    Comment: back problems  Other Topics Concern   Not on file  Social History Narrative   Not on file   Social Determinants of Health   Financial Resource Strain: Not on file  Food Insecurity: Not on file  Transportation Needs: Not on file  Physical Activity: Not on file  Stress: Not on file  Social Connections: Not on file   Past Surgical History:  Procedure Laterality Date   BACK SURGERY  2013   Back surgery  (fusion)09/15/2010 & 2013   BACK SURGERY  03-2013   HARDWARE REMOVAL N/A 03/14/2013   Procedure: HARDWARE REMOVAL;  Surgeon: Tia Alert, MD;  Location: [facility];  Service: Neurosurgery;  Laterality: N/A;   Past Surgical History:  Procedure Laterality Date   BACK SURGERY  2013   Back surgery  (fusion)09/15/2010 & 2013   BACK SURGERY  03-2013   HARDWARE REMOVAL N/A 03/14/2013   Procedure: HARDWARE REMOVAL;  Surgeon: Tia Alert, MD;  Location: [facility];  Service: Neurosurgery;  Laterality: N/A;   Past Medical History:  Diagnosis Date   Goals of care, counseling/discussion 04/07/2016   Iron deficiency anemia due to chronic blood loss 10/21/2016   Iron malabsorption  10/21/2016   No pertinent past medical history    Occupational injury 2013   resulted in back injury that led to surgery   Thyroid cancer (HCC)    Ht 5\' 5"  (1.651 m)   BMI 18.14 kg/m   Opioid Risk Score:   Fall Risk Score:  `1  Depression screen [facility] 2/9     04/14/2022   12:57 PM 03/28/2022    1:16 PM 10/01/2021   12:54 PM 08/31/2021   12:53 PM 08/05/2021    1:17 PM 06/03/2021    1:02 PM 05/10/2021    1:11 PM  Depression screen [facility] 2/9  Decreased Interest 0 0 0 0 0 0 0  Down, Depressed, Hopeless 0 0 0 0 0 0 0  PHQ - 2 Score 0 0 0 0 0 0 0      Review of Systems  Musculoskeletal:  Positive for back pain.       Rt leg pain  All other systems reviewed and are negative.     Objective:    Physical Exam Vitals and nursing note reviewed.  Constitutional:      Appearance: Normal appearance.  Cardiovascular:     Rate and Rhythm: Normal rate and regular rhythm.     Pulses: Normal pulses.     Heart sounds: Normal heart sounds.  Pulmonary:     Effort: Pulmonary effort is normal.     Breath sounds: Normal breath sounds.  Musculoskeletal:     Cervical back: Normal range of motion and neck supple.     Comments: Normal Muscle Bulk and Muscle Testing Reveals:  Upper Extremities: Full ROM and Muscle Strength 5/5 Lumbar Hypersensitivity Lower Extremities: Full ROM and Muscle Strength 5/5 Arises from Table slowly using cane for support Narrow Based  Gait     Skin:    General: Skin is warm and dry.  Neurological:     Mental Status: He is alert and oriented to person, place, and time.  Psychiatric:        Mood and Affect: Mood normal.        Behavior: Behavior normal.         Assessment & Plan:  1. Lumbar postlaminectomy syndrome status post lumbar fusion x3: His most recent surgery was 03/14/2013. He had hardware removal and bone allograft waist and L2-3.  Continue to Monitor. Continue current Medication Regimen.06/15/2022 Refilled: oxyCODONE 7.5/325mg  one tablet every 4 hours as needed #100 and : Xtampza 18 mg one tablet every 12 hours #60.We will continue the opioid monitoring program, this consists of regular clinic visits, examinations, urine drug screen, pill counts as well as use of [place] Controlled Substance Reporting system. A 12 month History has been reviewed on the [place] Controlled Substance Reporting System on 06/15/2022.  2. Lumbar Radiculopathy: Continue current medication regimen with Lyrica. 06/15/2022 3. Insomnia: Continue current medication regimen with Trazodone.06/15/2022 4. Nausea: Continue Zofran as needed.Oncology Following.  06/15/2022 5. Cervical Spondylosis/ Cervicalgia/ Cervical Radiculitis:  No complaints today. Continue current  medication regimen with  Lyrica. 06/15/2022 6. Cervical Myofascial Pain Syndrome: Continue current treatment regimen. Continue to monitor. 06/15/2022. 7. Paresthesia: Continue Lyrica: Continue to Monitor.  06/15/2022. 8. Oxygen Desaturation: O2 Saturation re-checked. He refuses Ed or Urgent Care Evaluation. He will F/U with his PCP. He verbalizes understanding.    F/U in 1 month

## 2022-06-30 ENCOUNTER — Ambulatory Visit: Payer: Medicare HMO | Admitting: Hematology & Oncology

## 2022-06-30 ENCOUNTER — Inpatient Hospital Stay: Payer: Medicare PPO

## 2022-06-30 ENCOUNTER — Inpatient Hospital Stay: Payer: Medicare PPO | Attending: Hematology & Oncology

## 2022-06-30 ENCOUNTER — Other Ambulatory Visit: Payer: Medicare HMO

## 2022-06-30 ENCOUNTER — Encounter: Payer: Self-pay | Admitting: Hematology & Oncology

## 2022-06-30 ENCOUNTER — Inpatient Hospital Stay (HOSPITAL_BASED_OUTPATIENT_CLINIC_OR_DEPARTMENT_OTHER): Payer: Medicare PPO | Admitting: Hematology & Oncology

## 2022-06-30 ENCOUNTER — Other Ambulatory Visit: Payer: Self-pay

## 2022-06-30 VITALS — BP 143/66 | HR 83 | Temp 98.4°F | Resp 19 | Ht 65.0 in | Wt 108.0 lb

## 2022-06-30 DIAGNOSIS — F17210 Nicotine dependence, cigarettes, uncomplicated: Secondary | ICD-10-CM

## 2022-06-30 DIAGNOSIS — D500 Iron deficiency anemia secondary to blood loss (chronic): Secondary | ICD-10-CM

## 2022-06-30 DIAGNOSIS — R634 Abnormal weight loss: Secondary | ICD-10-CM | POA: Insufficient documentation

## 2022-06-30 DIAGNOSIS — R1110 Vomiting, unspecified: Secondary | ICD-10-CM | POA: Diagnosis not present

## 2022-06-30 DIAGNOSIS — C090 Malignant neoplasm of tonsillar fossa: Secondary | ICD-10-CM | POA: Diagnosis not present

## 2022-06-30 DIAGNOSIS — E034 Atrophy of thyroid (acquired): Secondary | ICD-10-CM

## 2022-06-30 DIAGNOSIS — Z85818 Personal history of malignant neoplasm of other sites of lip, oral cavity, and pharynx: Secondary | ICD-10-CM | POA: Diagnosis present

## 2022-06-30 LAB — FERRITIN: Ferritin: 440 ng/mL — ABNORMAL HIGH (ref 24–336)

## 2022-06-30 LAB — CMP (CANCER CENTER ONLY)
ALT: 13 U/L (ref 0–44)
AST: 18 U/L (ref 15–41)
Albumin: 4.5 g/dL (ref 3.5–5.0)
Alkaline Phosphatase: 63 U/L (ref 38–126)
Anion gap: 9 (ref 5–15)
BUN: 11 mg/dL (ref 8–23)
CO2: 31 mmol/L (ref 22–32)
Calcium: 9.8 mg/dL (ref 8.9–10.3)
Chloride: 95 mmol/L — ABNORMAL LOW (ref 98–111)
Creatinine: 0.82 mg/dL (ref 0.61–1.24)
GFR, Estimated: >60 mL/min (ref >60)
Glucose, Bld: 100 mg/dL — ABNORMAL HIGH (ref 70–99)
Potassium: 4.4 mmol/L (ref 3.5–5.1)
Sodium: 135 mmol/L (ref 135–145)
Total Bilirubin: 0.5 mg/dL (ref 0.3–1.2)
Total Protein: 7.1 g/dL (ref 6.5–8.1)

## 2022-06-30 LAB — CBC WITH DIFFERENTIAL (CANCER CENTER ONLY)
Abs Immature Granulocytes: 0.01 10*3/uL (ref 0.00–0.07)
Basophils Absolute: 0 10*3/uL (ref 0.0–0.1)
Basophils Relative: 1 %
Eosinophils Absolute: 0 10*3/uL (ref 0.0–0.5)
Eosinophils Relative: 1 %
HCT: 44.7 % (ref 39.0–52.0)
Hemoglobin: 15.1 g/dL (ref 13.0–17.0)
Immature Granulocytes: 0 %
Lymphocytes Relative: 21 %
Lymphs Abs: 1.3 10*3/uL (ref 0.7–4.0)
MCH: 31.4 pg (ref 26.0–34.0)
MCHC: 33.8 g/dL (ref 30.0–36.0)
MCV: 92.9 fL (ref 80.0–100.0)
Monocytes Absolute: 0.7 10*3/uL (ref 0.1–1.0)
Monocytes Relative: 11 %
Neutro Abs: 4.2 10*3/uL (ref 1.7–7.7)
Neutrophils Relative %: 66 %
Platelet Count: 176 10*3/uL (ref 150–400)
RBC: 4.81 MIL/uL (ref 4.22–5.81)
RDW: 12.8 % (ref 11.5–15.5)
WBC Count: 6.2 10*3/uL (ref 4.0–10.5)
nRBC: 0 % (ref 0.0–0.2)

## 2022-06-30 LAB — TSH: TSH: 2.842 u[IU]/mL (ref 0.350–4.500)

## 2022-06-30 NOTE — Progress Notes (Signed)
Hematology and Oncology Follow Up Visit  Bruce Olson [id number] January 03, 1956 67 y.o. 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  The problem that we have is that he is losing weight.  His weight is now down to 108 pounds.  I am not sure as to what is causing all of this.  I know that he is on Synthroid.  We will check his TSH.  Is possible may have to hold off on the Synthroid.  He still has some vomiting.  He says he vomits when he eats too much.  He still has 3 meals a day.  I told him he really needs to have 5-6 small meals a day.  He has had no bleeding.   Back in December we did do a CT scan on him.  The CT scan of the chest did not show any evidence of metastatic or primary bronchogenic carcinoma.  Given his tobacco use he probably needs to have yearly low-dose CT scans.  He has had no diarrhea.  There has been no rashes.  He has had no fever.  He has had no exposure to COVID.  Overall, I would say his performance status is probably ECOG 2   Medications:  Allergies as of 06/30/2022   No Known Allergies      Medication List        Accurate as of Jun 30, 2022  2:15 PM. If you have any questions, ask your nurse or doctor.          albuterol 108 (90 Base) MCG/ACT inhaler Commonly known as: VENTOLIN HFA Inhale into the lungs.   albuterol 108 (90 Base) MCG/ACT inhaler Commonly known as: VENTOLIN HFA Inhale into the lungs.   ALBUTEROL IN 2 puff EVERY 6 HOURS (route: inhalation)   docusate sodium 100 MG capsule Commonly known as: COLACE Take 100 mg by mouth daily.   dronabinol 2.5 MG capsule Commonly known as: MARINOL Take 1 capsule (2.5 mg total) by mouth 2 (two) times daily before lunch and supper.   guaiFENesin  600 MG 12 hr tablet Commonly known as: MUCINEX Take by mouth.   levothyroxine 50 MCG tablet Commonly known as: SYNTHROID TAKE 1 TABLET BY MOUTH EVERY MORNING ON AN EMPTY STOMACH   metoCLOPramide 10 MG tablet Commonly known as: REGLAN TAKE 1 TABLET BY MOUTH 4 TIMES DAILY   multivitamin with minerals Tabs tablet Take 1 tablet by mouth daily.   Narcan 4 MG/0.1ML Liqd nasal spray kit Generic drug: naloxone   OLANZapine 10 MG tablet Commonly known as: ZYPREXA TAKE 1 TABLET BY MOUTH EVERY NIGHT AT BEDTIME   oxyCODONE-acetaminophen 7.5-325 MG tablet Commonly known as: Percocet Take 1 tablet by mouth every 4 (four) hours as needed. May take one extra tablet when pain is sever. No more than 4 a day.   OXYGEN   pilocarpine 5 MG tablet Commonly known as: SALAGEN Take 1 tablet by mouth 2 (two) times daily.   pregabalin 100 MG capsule Commonly known as: LYRICA Take 1 capsule (100 mg total) by mouth 3 (three) times daily.   Xtampza ER 18 MG C12a Generic drug: oxyCODONE ER Take 1 capsule by mouth every 12 (twelve) hours.  Allergies: No Known Allergies  Past Medical History, Surgical history, Social history, and Family History were reviewed and updated.  Review of Systems: Review of Systems  Constitutional:  Positive for malaise/fatigue.  HENT: Negative.    Eyes: Negative.   Respiratory: Negative.    Cardiovascular: Negative.   Gastrointestinal:  Positive for nausea and vomiting.  Genitourinary: Negative.   Musculoskeletal:  Positive for back pain.  Skin: Negative.   Neurological: Negative.   Endo/Heme/Allergies: Negative.   Psychiatric/Behavioral: Negative.       Physical Exam:  height is 5\' 5"  (1.651 m) and weight is 108 lb (49 kg). His oral temperature is 98.4 F (36.9 C). His blood pressure is 143/66 (abnormal) and his pulse is 83. His respiration is 19 and oxygen saturation is 90%.   Wt Readings from Last 3 Encounters:  06/30/22 108 lb (49 kg)   06/15/22 112 lb (50.8 kg)  05/16/22 109 lb (49.4 kg)    Physical Activity: Not on file   Physical Exam Vitals reviewed.  HENT:     Head: Normocephalic and atraumatic.  Eyes:     Pupils: Pupils are equal, round, and reactive to light.  Cardiovascular:     Rate and Rhythm: Normal rate and regular rhythm.     Heart sounds: Normal heart sounds.  Pulmonary:     Effort: Pulmonary effort is normal.     Breath sounds: Normal breath sounds.  Abdominal:     General: Bowel sounds are normal.     Palpations: Abdomen is soft.  Musculoskeletal:        General: No tenderness or deformity. Normal range of motion.     Cervical back: Normal range of motion.  Lymphadenopathy:     Cervical: No cervical adenopathy.  Skin:    General: Skin is warm and dry.     Findings: No erythema or rash.  Neurological:     Mental Status: He is alert and oriented to person, place, and time.  Psychiatric:        Behavior: Behavior normal.        Thought Content: Thought content normal.        Judgment: Judgment normal.       Lab Results  Component Value Date   WBC 6.2 06/30/2022   HGB 15.1 06/30/2022   HCT 44.7 06/30/2022   MCV 92.9 06/30/2022   PLT 176 06/30/2022   Lab Results  Component Value Date   FERRITIN 185 12/30/2021   IRON 26 (L) 12/30/2021   TIBC 319 12/30/2021   UIBC 293 12/30/2021   IRONPCTSAT 8 (L) 12/30/2021   Lab Results  Component Value Date   RETICCTPCT 1.7 06/12/2020   RBC 4.81 06/30/2022   No results found for: "KPAFRELGTCHN", "LAMBDASER", "KAPLAMBRATIO" No results found for: "IGGSERUM", "IGA", "IGMSERUM" No results found for: "TOTALPROTELP", "ALBUMINELP", "A1GS", "A2GS", "BETS", "BETA2SER", "GAMS", "MSPIKE", "SPEI"   Chemistry      Component Value Date/Time   NA 135 06/30/2022 1303   NA 138 01/20/2017 1324   NA 135 (L) 04/05/2016 1333   K 4.4 06/30/2022 1303   K 4.5 01/20/2017 1324   K 4.7 04/05/2016 1333   CL 95 (L) 06/30/2022 1303   CL 97 (L) 01/20/2017 1324    CO2 31 06/30/2022 1303   CO2 30 01/20/2017 1324   CO2 29 04/05/2016 1333   BUN 11 06/30/2022 1303   BUN 15 01/20/2017 1324   BUN 23.6 04/05/2016 1333   CREATININE 0.82 06/30/2022 1303   CREATININE 1.2  01/20/2017 1324   CREATININE 1.1 04/05/2016 1333      Component Value Date/Time   CALCIUM 9.8 06/30/2022 1303   CALCIUM 9.2 01/20/2017 1324   CALCIUM 9.2 04/05/2016 1333   ALKPHOS 63 06/30/2022 1303   ALKPHOS 51 01/20/2017 1324   ALKPHOS 67 04/05/2016 1333   AST 18 06/30/2022 1303   AST 17 04/05/2016 1333   ALT 13 06/30/2022 1303   ALT 26 01/20/2017 1324   ALT 10 04/05/2016 1333   BILITOT 0.5 06/30/2022 1303   BILITOT 0.30 04/05/2016 1333       Impression and Plan: Mr. Steff is a very pleasant 67 yo caucasian gentleman with history of squamous cell carcinoma of the left tonsil.  Thankfully, the tumor is HPV positive.   He completed chemo/radiation therapy with [facility] in August 2017.  This weight loss is really troublesome to me.  I really hate this for him.  If he continues to lose weight, I am unsure how long he will be able to survive.  Again we will make a referral to one of our nutritionist.  I would have to see him back in a couple months so we can stay on top of the weight loss.   Josph Macho, MD 5/30/20242:15 PM

## 2022-07-01 ENCOUNTER — Telehealth: Payer: Self-pay | Admitting: *Deleted

## 2022-07-01 LAB — IRON AND IRON BINDING CAPACITY (CC-WL,HP ONLY)
Iron: 49 ug/dL (ref 45–182)
Saturation Ratios: 15 % — ABNORMAL LOW (ref 17.9–39.5)
TIBC: 337 ug/dL (ref 250–450)
UIBC: 288 ug/dL (ref 117–376)

## 2022-07-01 NOTE — Telephone Encounter (Addendum)
Call placed to patient and message left to inform him per order of Dr Myna Hidalgo that "the iron studies are still on the lower side. He needs to come in for a dose of IV iron and that the thyroid is actually doing quite well.  There is no change in thyroid dose.  Thanks.  Pete"  Instructed pt to call office back with any questions or concerns and informed him that a scheduler will call to schedule his infusion appt.

## 2022-07-05 ENCOUNTER — Inpatient Hospital Stay: Payer: Medicare PPO | Attending: Hematology & Oncology

## 2022-07-05 VITALS — BP 127/68 | HR 73 | Temp 97.9°F | Resp 20

## 2022-07-05 DIAGNOSIS — D500 Iron deficiency anemia secondary to blood loss (chronic): Secondary | ICD-10-CM

## 2022-07-05 DIAGNOSIS — K909 Intestinal malabsorption, unspecified: Secondary | ICD-10-CM

## 2022-07-05 DIAGNOSIS — D509 Iron deficiency anemia, unspecified: Secondary | ICD-10-CM | POA: Insufficient documentation

## 2022-07-05 MED ORDER — SODIUM CHLORIDE 0.9 % IV SOLN
300.0000 mg | Freq: Once | INTRAVENOUS | Status: AC
  Administered 2022-07-05: 300 mg via INTRAVENOUS
  Filled 2022-07-05: qty 300

## 2022-07-05 MED ORDER — SODIUM CHLORIDE 0.9 % IV SOLN: INTRAVENOUS | Status: DC

## 2022-07-07 ENCOUNTER — Inpatient Hospital Stay: Payer: Medicare PPO | Admitting: Dietician

## 2022-07-07 NOTE — Progress Notes (Signed)
Nutrition Assessment:  Called patient at home telephone#   Reason for Assessment: MST screen for weight loss.  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  He is under surveillance by Dr. Myna Hidalgo who is concerned with recent weight loss.  Patient reports no concerns with eating other than he can not take too much volume as he stomach capacity is much smaller after having his feeding tube removed. If he tried to eat too much he will vomit, but he denies any nausea. Bowels regular. Weighs himself weekly. Usual intake:  Cereal ( in morning with  Whole milk) Bologna Sandwich with mayonnaise(1 slice with thick bologna) at dinner, sometimes eats out at [employer] (regular burger or a fish sandwich with fries) Microwave dinner for supper (lasagna, ravioli) also likes stews and pinto beans.  Sister lives about 30 minutes a way and send home meals on Sunday Ice cream before bed Regular Boost in morning and night, 1.50 cups water, 2-3 glass   Anthropometrics:  weight fluctuating past 3 months (TSH checked WNL)  Height: 65" Weight:  06/30/22  108# 06/15/22  112# 05/16/22   109# 04/14/22  111# UBW: 115# (2023) BMI: 17.97    NUTRITION DIAGNOSIS: Inadequate PO intake to meet increased nutrient needs, r/t early satiety from gastric surgery post treatment when feeding tube removed.  INTERVENTION:  Relayed that nutrition services are wrap around service provided at no charge and encouraged continued communication if experiencing continued weight loss or any nutritional impact symptoms (NIS). Educated on importance of adequate calorie and protein energy intake  with nutrient dense foods when possible to maintain weight/strength. Encouraged high protein snacking between meals Suggested switching to 1.5 calorie oral nutrition supplements (encouraged trial of Equate plus  for cost effective alternative to Boost Plus) Encouraged him to weight weekly and reach out if still losing weight Mailed Nutrition Tip sheet  for  High Protein High Calorie Snacking with Contact information to home address.   MONITORING, EVALUATION, GOAL: weight, PO intake, Nutrition Impact Symptoms, labs Goal is weight maintenance  Next Visit: Remote next month  Gennaro Africa, RDN, LDN Registered Dietitian, [facility] Part Time Remote (Usual office hours: Tuesday-Thursday) Cell: [phone]

## 2022-07-11 ENCOUNTER — Other Ambulatory Visit: Payer: Self-pay | Admitting: Registered Nurse

## 2022-07-12 ENCOUNTER — Ambulatory Visit: Payer: Medicare PPO | Admitting: Registered Nurse

## 2022-07-13 ENCOUNTER — Encounter: Payer: Medicare PPO | Attending: Physical Medicine and Rehabilitation | Admitting: Registered Nurse

## 2022-07-13 ENCOUNTER — Encounter: Payer: Self-pay | Admitting: Registered Nurse

## 2022-07-13 VITALS — BP 144/72 | HR 92 | Ht 65.0 in | Wt 109.8 lb

## 2022-07-13 DIAGNOSIS — G894 Chronic pain syndrome: Secondary | ICD-10-CM | POA: Diagnosis present

## 2022-07-13 DIAGNOSIS — R0902 Hypoxemia: Secondary | ICD-10-CM | POA: Insufficient documentation

## 2022-07-13 DIAGNOSIS — Z9889 Other specified postprocedural states: Secondary | ICD-10-CM | POA: Diagnosis present

## 2022-07-13 DIAGNOSIS — Z5181 Encounter for therapeutic drug level monitoring: Secondary | ICD-10-CM | POA: Insufficient documentation

## 2022-07-13 DIAGNOSIS — G8929 Other chronic pain: Secondary | ICD-10-CM | POA: Diagnosis present

## 2022-07-13 DIAGNOSIS — M545 Low back pain, unspecified: Secondary | ICD-10-CM | POA: Diagnosis present

## 2022-07-13 DIAGNOSIS — M546 Pain in thoracic spine: Secondary | ICD-10-CM | POA: Insufficient documentation

## 2022-07-13 DIAGNOSIS — R202 Paresthesia of skin: Secondary | ICD-10-CM | POA: Diagnosis present

## 2022-07-13 DIAGNOSIS — Z79891 Long term (current) use of opiate analgesic: Secondary | ICD-10-CM | POA: Diagnosis present

## 2022-07-13 DIAGNOSIS — M961 Postlaminectomy syndrome, not elsewhere classified: Secondary | ICD-10-CM | POA: Insufficient documentation

## 2022-07-13 DIAGNOSIS — R634 Abnormal weight loss: Secondary | ICD-10-CM | POA: Diagnosis present

## 2022-07-13 MED ORDER — OXYCODONE-ACETAMINOPHEN 7.5-325 MG PO TABS: 1.0000 | ORAL_TABLET | ORAL | 0 refills | Status: DC | PRN

## 2022-07-13 MED ORDER — XTAMPZA ER 18 MG PO C12A: 1.0000 | EXTENDED_RELEASE_CAPSULE | Freq: Two times a day (BID) | ORAL | 0 refills | Status: DC

## 2022-07-13 NOTE — Progress Notes (Signed)
Subjective:    Patient ID: Bruce Olson, male    DOB: 1955-07-29, 67 y.o.   MRN: [id number]  HPI: Bruce Olson is a 67 y.o. male who returns for follow up appointment for chronic pain and medication refill. He states his pain is located in  his mid- lower back pain radiating into her right lower extremity. He rates his pain 7. His current exercise regime is walking and performing stretching exercises.  Bruce Olson Morphine equivalent is 101.50 MME.  Last UDS was Performed on 04/14/2022, it was consistent.     Pain Inventory Average Pain 7 Pain Right Now 7 My pain is sharp, stabbing, and tingling  In the last 24 hours, has pain interfered with the following? General activity 7 Relation with others 7 Enjoyment of life 7 What TIME of day is your pain at its worst? morning , daytime, evening, and night Sleep (in general) Poor  Pain is worse with: walking, sitting, and standing Pain improves with: heat/ice, medication, and TENS Relief from Meds: 3  Family History  Problem Relation Age of Onset   Anesthesia problems Neg Hx    Hypotension Neg Hx    Malignant hyperthermia Neg Hx    Pseudochol deficiency Neg Hx    Social History   Socioeconomic History   Marital status: Single    Spouse name: Not on file   Number of children: Not on file   Years of education: Not on file   Highest education level: Not on file  Occupational History   Not on file  Tobacco Use   Smoking status: Every Day    Packs/day: 1.00    Years: 33.00    Additional pack years: 0.00    Total pack years: 33.00    Types: Cigarettes   Smokeless tobacco: Never  Vaping Use   Vaping Use: Never used  Substance and Sexual Activity   Alcohol use: No   Drug use: No   Sexual activity: Never    Comment: back problems  Other Topics Concern   Not on file  Social History Narrative   Not on file   Social Determinants of Health   Financial Resource Strain: Not on file  Food Insecurity: Not on  file  Transportation Needs: Not on file  Physical Activity: Not on file  Stress: Not on file  Social Connections: Not on file   Past Surgical History:  Procedure Laterality Date   BACK SURGERY  2013   Back surgery  (fusion)09/15/2010 & 2013   BACK SURGERY  03-2013   HARDWARE REMOVAL N/A 03/14/2013   Procedure: HARDWARE REMOVAL;  Surgeon: Tia Alert, MD;  Location: [facility];  Service: Neurosurgery;  Laterality: N/A;   Past Surgical History:  Procedure Laterality Date   BACK SURGERY  2013   Back surgery  (fusion)09/15/2010 & 2013   BACK SURGERY  03-2013   HARDWARE REMOVAL N/A 03/14/2013   Procedure: HARDWARE REMOVAL;  Surgeon: Tia Alert, MD;  Location: [facility];  Service: Neurosurgery;  Laterality: N/A;   Past Medical History:  Diagnosis Date   Goals of care, counseling/discussion 04/07/2016   Iron deficiency anemia due to chronic blood loss 10/21/2016   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   No pertinent past medical history    Occupational injury 2013   resulted in back injury that led to surgery   Thyroid cancer (HCC)    BP (!) 144/72   Pulse 92   Ht 5\' 5"  (1.651 m)  Wt 109 lb 12.8 oz (49.8 kg)   SpO2 (!) 89%   BMI 18.27 kg/m   Opioid Risk Score:   Fall Risk Score:  `1  Depression screen PHQ 2/9     06/15/2022    1:05 PM 04/14/2022   12:57 PM 03/28/2022    1:16 PM 10/01/2021   12:54 PM 08/31/2021   12:53 PM 08/05/2021    1:17 PM 06/03/2021    1:02 PM  Depression screen PHQ 2/9  Decreased Interest 0 0 0 0 0 0 0  Down, Depressed, Hopeless 0 0 0 0 0 0 0  PHQ - 2 Score 0 0 0 0 0 0 0      Review of Systems  Musculoskeletal:  Positive for back pain.  All other systems reviewed and are negative.     Objective:   Physical Exam Vitals and nursing note reviewed.  Constitutional:      Appearance: Normal appearance.  Cardiovascular:     Rate and Rhythm: Normal rate and regular rhythm.     Pulses: Normal pulses.     Heart sounds: Normal heart sounds.  Pulmonary:      Effort: Pulmonary effort is normal.     Breath sounds: Normal breath sounds.  Musculoskeletal:     Cervical back: Normal range of motion and neck supple.     Comments: Normal Muscle Bulk and Muscle Testing Reveals:  Upper Extremities: Decreased ROM 90 Degrees  and Muscle Strength  5/5 Thoracic and Lumbar Hypersensitivity Lower Extremities: Full ROM and Muscle Strength 5/5 Arises from Table slowly using cane for support Antalgic  Gait     Skin:    General: Skin is warm and dry.  Neurological:     Mental Status: He is alert and oriented to person, place, and time.  Psychiatric:        Mood and Affect: Mood normal.        Behavior: Behavior normal.         Assessment & Plan:  1. Lumbar postlaminectomy syndrome status post lumbar fusion x3: His most recent surgery was 03/14/2013. He had hardware removal and bone allograft waist and L2-3.  Continue to Monitor. Continue current Medication Regimen.07/13/2022 Refilled: oxyCODONE 7.5/325mg  one tablet every 4 hours as needed #100 and : Xtampza 18 mg one tablet every 12 hours #60.We will continue the opioid monitoring program, this consists of regular clinic visits, examinations, urine drug screen, pill counts as well as use of [place] Controlled Substance Reporting system. A 12 month History has been reviewed on the [place] Controlled Substance Reporting System on 07/13/2022.  2. Lumbar Radiculopathy: Continue current medication regimen with Lyrica. 07/13/2022 3. Insomnia: Continue current medication regimen with Trazodone.07/13/2022 4. Nausea: Continue Zofran as needed.Oncology Following.  07/13/2022 5. Cervical Spondylosis/ Cervicalgia/ Cervical Radiculitis:  No complaints today. Continue current medication regimen with  Lyrica. 07/13/2022 6. Cervical Myofascial Pain Syndrome: Continue current treatment regimen. Continue to monitor. 07/13/2022. 7. Paresthesia: Continue Lyrica: Continue to Monitor.  07/13/2022. 8. Oxygen  Desaturation: O2 Saturation re-checked.  Continue to Monitor. Pulmonary Following.    F/U in 1 month

## 2022-07-23 ENCOUNTER — Other Ambulatory Visit: Payer: Self-pay | Admitting: Hematology & Oncology

## 2022-07-23 DIAGNOSIS — C090 Malignant neoplasm of tonsillar fossa: Secondary | ICD-10-CM

## 2022-07-24 ENCOUNTER — Encounter: Payer: Self-pay | Admitting: Hematology & Oncology

## 2022-08-11 ENCOUNTER — Encounter: Payer: Self-pay | Admitting: Registered Nurse

## 2022-08-11 ENCOUNTER — Encounter: Payer: Medicare PPO | Attending: Physical Medicine and Rehabilitation | Admitting: Registered Nurse

## 2022-08-11 VITALS — BP 141/78 | HR 88 | Ht 65.0 in | Wt 109.0 lb

## 2022-08-11 DIAGNOSIS — Z79891 Long term (current) use of opiate analgesic: Secondary | ICD-10-CM | POA: Diagnosis present

## 2022-08-11 DIAGNOSIS — Z5181 Encounter for therapeutic drug level monitoring: Secondary | ICD-10-CM | POA: Insufficient documentation

## 2022-08-11 DIAGNOSIS — M546 Pain in thoracic spine: Secondary | ICD-10-CM | POA: Insufficient documentation

## 2022-08-11 DIAGNOSIS — G894 Chronic pain syndrome: Secondary | ICD-10-CM | POA: Insufficient documentation

## 2022-08-11 DIAGNOSIS — M545 Low back pain, unspecified: Secondary | ICD-10-CM | POA: Insufficient documentation

## 2022-08-11 DIAGNOSIS — Z9889 Other specified postprocedural states: Secondary | ICD-10-CM | POA: Insufficient documentation

## 2022-08-11 DIAGNOSIS — R202 Paresthesia of skin: Secondary | ICD-10-CM | POA: Insufficient documentation

## 2022-08-11 DIAGNOSIS — M961 Postlaminectomy syndrome, not elsewhere classified: Secondary | ICD-10-CM | POA: Insufficient documentation

## 2022-08-11 DIAGNOSIS — G8929 Other chronic pain: Secondary | ICD-10-CM | POA: Diagnosis present

## 2022-08-11 MED ORDER — XTAMPZA ER 18 MG PO C12A: 1.0000 | EXTENDED_RELEASE_CAPSULE | Freq: Two times a day (BID) | ORAL | 0 refills | Status: DC

## 2022-08-11 MED ORDER — OXYCODONE-ACETAMINOPHEN 7.5-325 MG PO TABS: 1.0000 | ORAL_TABLET | ORAL | 0 refills | Status: DC | PRN

## 2022-08-11 NOTE — Progress Notes (Signed)
Subjective:    Patient ID: Bruce Olson, male    DOB: 22-May-1955, 67 y.o.   MRN: [id number]  HPI: Bruce Olson is a 67 y.o. male who returns for follow up appointment for chronic pain and medication refill. He states his pain is located in his mid- lower back pain. He rates his pain 7. His current exercise regime is walking and performing stretching exercises.  [name] equivalent is 121.50 MME.   Last UDS was Performed on 04/14/2022, it was consistent.      Pain Inventory Average Pain 7 Pain Right Now 7 My pain is constant, sharp, stabbing, and tingling  In the last 24 hours, has pain interfered with the following? General activity 7 Relation with others 7  Enjoyment of life 7 What TIME of day is your pain at its worst? morning , daytime, evening, and night Sleep (in general) Poor  Pain is worse with: walking, sitting, and standing Pain improves with: heat/ice, medication, and TENS Relief from Meds: 3  Family History  Problem Relation Age of Onset   Anesthesia problems Neg Hx    Hypotension Neg Hx    Malignant hyperthermia Neg Hx    Pseudochol deficiency Neg Hx    Social History   Socioeconomic History   Marital status: Single    Spouse name: Not on file   Number of children: Not on file   Years of education: Not on file   Highest education level: Not on file  Occupational History   Not on file  Tobacco Use   Smoking status: Every Day    Current packs/day: 1.00    Average packs/day: 1 pack/day for 33.0 years (33.0 ttl pk-yrs)    Types: Cigarettes   Smokeless tobacco: Never  Vaping Use   Vaping status: Never Used  Substance and Sexual Activity   Alcohol use: No   Drug use: No   Sexual activity: Never    Comment: back problems  Other Topics Concern   Not on file  Social History Narrative   Not on file   Social Determinants of Health   Financial Resource Strain: Not on file  Food Insecurity: Not on file  Transportation  Needs: Not on file  Physical Activity: Not on file  Stress: Not on file  Social Connections: Not on file   Past Surgical History:  Procedure Laterality Date   BACK SURGERY  2013   Back surgery  (fusion)09/15/2010 & 2013   BACK SURGERY  03-2013   HARDWARE REMOVAL N/A 03/14/2013   Procedure: HARDWARE REMOVAL;  Surgeon: Tia Alert, MD;  Location: [facility];  Service: Neurosurgery;  Laterality: N/A;   Past Surgical History:  Procedure Laterality Date   BACK SURGERY  2013   Back surgery  (fusion)09/15/2010 & 2013   BACK SURGERY  03-2013   HARDWARE REMOVAL N/A 03/14/2013   Procedure: HARDWARE REMOVAL;  Surgeon: Tia Alert, MD;  Location: [facility];  Service: Neurosurgery;  Laterality: N/A;   Past Medical History:  Diagnosis Date   Goals of care, counseling/discussion 04/07/2016   Iron deficiency anemia due to chronic blood loss 10/21/2016   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  Opioid Risk Score:   Fall Risk Score:  `1  Depression screen [facility] 2/9  06/15/2022    1:05 PM 04/14/2022   12:57 PM 03/28/2022    1:16 PM 10/01/2021   12:54 PM 08/31/2021   12:53 PM 08/05/2021    1:17 PM 06/03/2021    1:02 PM  Depression screen PHQ 2/9  Decreased Interest 0 0 0 0 0 0 0  Down, Depressed, Hopeless 0 0 0 0 0 0 0  PHQ - 2 Score 0 0 0 0 0 0 0    Review of Systems  Musculoskeletal:  Positive for back pain and gait problem.       Right upper leg pain  All other systems reviewed and are negative.     Objective:   Physical Exam Vitals and nursing note reviewed.  Constitutional:      Appearance: Normal appearance.  Cardiovascular:     Rate and Rhythm: Normal rate and regular rhythm.     Pulses: Normal pulses.     Heart sounds: Normal heart sounds.  Pulmonary:     Effort: Pulmonary effort is normal.     Breath  sounds: Normal breath sounds.  Musculoskeletal:     Cervical back: Normal range of motion and neck supple.     Right lower leg: Edema present.     Left lower leg: Edema present.     Comments: Normal Muscle Bulk and Muscle Testing Reveals:  Upper Extremities: Full ROM and Muscle Strength 5/5 Lumbar Hypersensitivity Lower Extremities: Full ROM and Muscle Strength 5/5 Arises from table slowly using cane for support Antalgic  Gait     Skin:    General: Skin is warm and dry.  Neurological:     Mental Status: He is alert and oriented to person, place, and time.  Psychiatric:        Mood and Affect: Mood normal.        Behavior: Behavior normal.         Assessment & Plan:  1. Lumbar postlaminectomy syndrome status post lumbar fusion x3: His most recent surgery was 03/14/2013. He had hardware removal and bone allograft waist and L2-3.  Continue to Monitor. Continue current Medication Regimen.08/11/2022 Refilled: oxyCODONE 7.5/325mg  one tablet every 4 hours as needed #100 and : Xtampza 18 mg one tablet every 12 hours #60.We will continue the opioid monitoring program, this consists of regular clinic visits, examinations, urine drug screen, pill counts as well as use of [place] Controlled Substance Reporting system. A 12 month History has been reviewed on the [place] Controlled Substance Reporting System on 08/11/2022.  2. Lumbar Radiculopathy: Continue current medication regimen with Lyrica. 08/11/2022 3. Insomnia: Continue current medication regimen with Trazodone.08/11/2022 4. Nausea: Continue Zofran as needed.Oncology Following.  08/11/2022 5. Cervical Spondylosis/ Cervicalgia/ Cervical Radiculitis:  No complaints today. Continue current medication regimen with  Lyrica. 08/11/2022 6. Cervical Myofascial Pain Syndrome: Continue current treatment regimen. Continue to monitor. 08/11/2022. 7. Paresthesia: Continue Lyrica: Continue to Monitor.  08/11/2022.   F/U in 1 month

## 2022-08-17 ENCOUNTER — Telehealth: Payer: Self-pay | Admitting: Dietician

## 2022-08-17 ENCOUNTER — Other Ambulatory Visit: Payer: Self-pay | Admitting: Hematology & Oncology

## 2022-08-17 ENCOUNTER — Inpatient Hospital Stay: Payer: Medicare PPO | Admitting: Dietician

## 2022-08-17 NOTE — Telephone Encounter (Signed)
Attempted to reach patient for a scheduled remote nutrition consult. Provided my cell# on voice mail to return call for his follow up nutrition consult.  Cyndi Dinger, RDN, LDN Registered Dietitian, [facility] Part Time Remote (Usual office hours: Tuesday-Thursday) Cell: [phone]   

## 2022-08-31 ENCOUNTER — Encounter: Payer: Self-pay | Admitting: Hematology & Oncology

## 2022-08-31 ENCOUNTER — Inpatient Hospital Stay: Payer: Medicare PPO | Attending: Hematology & Oncology

## 2022-08-31 ENCOUNTER — Inpatient Hospital Stay (HOSPITAL_BASED_OUTPATIENT_CLINIC_OR_DEPARTMENT_OTHER): Payer: Medicare PPO | Admitting: Hematology & Oncology

## 2022-08-31 VITALS — BP 142/58 | HR 92 | Temp 97.9°F | Resp 18 | Wt 110.8 lb

## 2022-08-31 DIAGNOSIS — R5383 Other fatigue: Secondary | ICD-10-CM | POA: Diagnosis not present

## 2022-08-31 DIAGNOSIS — C090 Malignant neoplasm of tonsillar fossa: Secondary | ICD-10-CM

## 2022-08-31 DIAGNOSIS — R112 Nausea with vomiting, unspecified: Secondary | ICD-10-CM | POA: Insufficient documentation

## 2022-08-31 DIAGNOSIS — D509 Iron deficiency anemia, unspecified: Secondary | ICD-10-CM | POA: Diagnosis not present

## 2022-08-31 DIAGNOSIS — Z9221 Personal history of antineoplastic chemotherapy: Secondary | ICD-10-CM | POA: Insufficient documentation

## 2022-08-31 DIAGNOSIS — Z85818 Personal history of malignant neoplasm of other sites of lip, oral cavity, and pharynx: Secondary | ICD-10-CM | POA: Diagnosis present

## 2022-08-31 DIAGNOSIS — Z923 Personal history of irradiation: Secondary | ICD-10-CM | POA: Diagnosis not present

## 2022-08-31 DIAGNOSIS — M549 Dorsalgia, unspecified: Secondary | ICD-10-CM | POA: Insufficient documentation

## 2022-08-31 DIAGNOSIS — D500 Iron deficiency anemia secondary to blood loss (chronic): Secondary | ICD-10-CM

## 2022-08-31 DIAGNOSIS — E034 Atrophy of thyroid (acquired): Secondary | ICD-10-CM

## 2022-08-31 LAB — IRON AND IRON BINDING CAPACITY (CC-WL,HP ONLY)
Iron: 37 ug/dL — ABNORMAL LOW (ref 45–182)
Saturation Ratios: 12 % — ABNORMAL LOW (ref 17.9–39.5)
TIBC: 304 ug/dL (ref 250–450)
UIBC: 267 ug/dL (ref 117–376)

## 2022-08-31 LAB — CMP (CANCER CENTER ONLY)
ALT: 11 U/L (ref 0–44)
AST: 18 U/L (ref 15–41)
Albumin: 4.2 g/dL (ref 3.5–5.0)
Alkaline Phosphatase: 61 U/L (ref 38–126)
Anion gap: 4 — ABNORMAL LOW (ref 5–15)
BUN: 14 mg/dL (ref 8–23)
CO2: 35 mmol/L — ABNORMAL HIGH (ref 22–32)
Calcium: 9 mg/dL (ref 8.9–10.3)
Chloride: 94 mmol/L — ABNORMAL LOW (ref 98–111)
Creatinine: 0.82 mg/dL (ref 0.61–1.24)
GFR, Estimated: >60 mL/min (ref >60)
Glucose, Bld: 111 mg/dL — ABNORMAL HIGH (ref 70–99)
Potassium: 4.7 mmol/L (ref 3.5–5.1)
Sodium: 133 mmol/L — ABNORMAL LOW (ref 135–145)
Total Bilirubin: 0.4 mg/dL (ref 0.3–1.2)
Total Protein: 6.8 g/dL (ref 6.5–8.1)

## 2022-08-31 LAB — CBC WITH DIFFERENTIAL (CANCER CENTER ONLY)
Abs Immature Granulocytes: 0.02 10*3/uL (ref 0.00–0.07)
Basophils Absolute: 0 10*3/uL (ref 0.0–0.1)
Basophils Relative: 1 %
Eosinophils Absolute: 0 10*3/uL (ref 0.0–0.5)
Eosinophils Relative: 1 %
HCT: 45.4 % (ref 39.0–52.0)
Hemoglobin: 15.1 g/dL (ref 13.0–17.0)
Immature Granulocytes: 0 %
Lymphocytes Relative: 22 %
Lymphs Abs: 1.2 10*3/uL (ref 0.7–4.0)
MCH: 31.9 pg (ref 26.0–34.0)
MCHC: 33.3 g/dL (ref 30.0–36.0)
MCV: 96 fL (ref 80.0–100.0)
Monocytes Absolute: 0.7 10*3/uL (ref 0.1–1.0)
Monocytes Relative: 14 %
Neutro Abs: 3.4 10*3/uL (ref 1.7–7.7)
Neutrophils Relative %: 62 %
Platelet Count: 150 10*3/uL (ref 150–400)
RBC: 4.73 MIL/uL (ref 4.22–5.81)
RDW: 12.4 % (ref 11.5–15.5)
WBC Count: 5.3 10*3/uL (ref 4.0–10.5)
nRBC: 0 % (ref 0.0–0.2)

## 2022-08-31 LAB — TSH: TSH: 2.045 u[IU]/mL (ref 0.350–4.500)

## 2022-08-31 LAB — FERRITIN: Ferritin: 482 ng/mL — ABNORMAL HIGH (ref 24–336)

## 2022-08-31 NOTE — Progress Notes (Signed)
Hematology and Oncology Follow Up Visit  Bruce Olson [id number] [birth year] 67 y.o. 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 is here today for follow-up.  We last saw him back in May.  Since then, he has been doing fairly well.  There has been no problems with pain.  This seems to be under fairly good control.  He says he cannot take high-protein Ensure.  He says this is too rich for his stomach.  He says when he has high protein and sugar, he throws up.  He have regular Ensure without any issues.  He has had no problems with bleeding.  There is no change in bowel or bladder habits.  His last TSH was 2.8 back in May.  There has been no fever.  He just cannot gain weight.  I told him that this probably is not that much of an issue at this point.  I think that as long as he does not lose weight he should be okay.  Currently, I would have said that his performance status is probably ECOG 2-3.   Medications:  Allergies as of 08/31/2022   No Known Allergies      Medication List        Accurate as of August 31, 2022  1:37 PM. If you have any questions, ask your nurse or doctor.          albuterol 108 (90 Base) MCG/ACT inhaler Commonly known as: VENTOLIN HFA Inhale into the lungs. What changed: Another medication with the same name was removed. Continue taking this medication, and follow the directions you see here. Changed by: Josph Macho   ALBUTEROL IN 2 puff EVERY 6 HOURS (route: inhalation)   docusate sodium 100 MG capsule Commonly known as: COLACE Take 100 mg by mouth daily.   dronabinol 2.5 MG capsule Commonly known as: MARINOL Take 1 capsule (2.5 mg total) by mouth 2 (two) times daily before lunch and supper.   guaiFENesin 600 MG 12 hr  tablet Commonly known as: MUCINEX Take by mouth.   levothyroxine 50 MCG tablet Commonly known as: SYNTHROID TAKE 1 TABLET BY MOUTH EVERY MORNING ON AN EMPTY STOMACH   metoCLOPramide 10 MG tablet Commonly known as: REGLAN TAKE 1 TABLET BY MOUTH 4 TIMES DAILY   multivitamin with minerals Tabs tablet Take 1 tablet by mouth daily.   Narcan 4 MG/0.1ML Liqd nasal spray kit Generic drug: naloxone   OLANZapine 10 MG tablet Commonly known as: ZYPREXA TAKE 1 TABLET BY MOUTH EVERY NIGHT AT BEDTIME   oxyCODONE-acetaminophen 7.5-325 MG tablet Commonly known as: Percocet Take 1 tablet by mouth every 4 (four) hours as needed. May take one extra tablet when pain is sever. No more than 4 a day.   OXYGEN   pilocarpine 5 MG tablet Commonly known as: SALAGEN Take 1 tablet by mouth 2 (two) times daily.   pregabalin 100 MG capsule Commonly known as: LYRICA Take 1 capsule (100 mg total) by mouth 3 (three) times daily.   Xtampza ER 18 MG C12a Generic drug: oxyCODONE ER Take 1 capsule by mouth every 12 (twelve) hours.        Allergies: No Known Allergies  Past Medical History, Surgical history, Social history, and Family History were reviewed  and updated.  Review of Systems: Review of Systems  Constitutional:  Positive for malaise/fatigue.  HENT: Negative.    Eyes: Negative.   Respiratory: Negative.    Cardiovascular: Negative.   Gastrointestinal:  Positive for nausea and vomiting.  Genitourinary: Negative.   Musculoskeletal:  Positive for back pain.  Skin: Negative.   Neurological: Negative.   Endo/Heme/Allergies: Negative.   Psychiatric/Behavioral: Negative.       Physical Exam:  weight is 110 lb 12 oz (50.2 kg). His oral temperature is 97.9 F (36.6 C). His blood pressure is 142/58 (abnormal) and his pulse is 92. His respiration is 18 and oxygen saturation is 93%.   Wt Readings from Last 3 Encounters:  08/31/22 110 lb 12 oz (50.2 kg)  08/11/22 109 lb (49.4 kg)   07/13/22 109 lb 12.8 oz (49.8 kg)    Physical Activity: Not on file   Physical Exam Vitals reviewed.  HENT:     Head: Normocephalic and atraumatic.  Eyes:     Pupils: Pupils are equal, round, and reactive to light.  Cardiovascular:     Rate and Rhythm: Normal rate and regular rhythm.     Heart sounds: Normal heart sounds.  Pulmonary:     Effort: Pulmonary effort is normal.     Breath sounds: Normal breath sounds.  Abdominal:     General: Bowel sounds are normal.     Palpations: Abdomen is soft.  Musculoskeletal:        General: No tenderness or deformity. Normal range of motion.     Cervical back: Normal range of motion.  Lymphadenopathy:     Cervical: No cervical adenopathy.  Skin:    General: Skin is warm and dry.     Findings: No erythema or rash.  Neurological:     Mental Status: He is alert and oriented to person, place, and time.  Psychiatric:        Behavior: Behavior normal.        Thought Content: Thought content normal.        Judgment: Judgment normal.       Lab Results  Component Value Date   WBC 5.3 08/31/2022   HGB 15.1 08/31/2022   HCT 45.4 08/31/2022   MCV 96.0 08/31/2022   PLT 150 08/31/2022   Lab Results  Component Value Date   FERRITIN 440 (H) 06/30/2022   IRON 49 06/30/2022   TIBC 337 06/30/2022   UIBC 288 06/30/2022   IRONPCTSAT 15 (L) 06/30/2022   Lab Results  Component Value Date   RETICCTPCT 1.7 06/12/2020   RBC 4.73 08/31/2022   No results found for: "KPAFRELGTCHN", "LAMBDASER", "KAPLAMBRATIO" No results found for: "IGGSERUM", "IGA", "IGMSERUM" No results found for: "TOTALPROTELP", "ALBUMINELP", "A1GS", "A2GS", "BETS", "BETA2SER", "GAMS", "MSPIKE", "SPEI"   Chemistry      Component Value Date/Time   NA 133 (L) 08/31/2022 1300   NA 138 01/20/2017 1324   NA 135 (L) 04/05/2016 1333   K 4.7 08/31/2022 1300   K 4.5 01/20/2017 1324   K 4.7 04/05/2016 1333   CL 94 (L) 08/31/2022 1300   CL 97 (L) 01/20/2017 1324   CO2 35 (H)  08/31/2022 1300   CO2 30 01/20/2017 1324   CO2 29 04/05/2016 1333   BUN 14 08/31/2022 1300   BUN 15 01/20/2017 1324   BUN 23.6 04/05/2016 1333   CREATININE 0.82 08/31/2022 1300   CREATININE 1.2 01/20/2017 1324   CREATININE 1.1 04/05/2016 1333      Component Value Date/Time  CALCIUM 9.0 08/31/2022 1300   CALCIUM 9.2 01/20/2017 1324   CALCIUM 9.2 04/05/2016 1333   ALKPHOS 61 08/31/2022 1300   ALKPHOS 51 01/20/2017 1324   ALKPHOS 67 04/05/2016 1333   AST 18 08/31/2022 1300   AST 17 04/05/2016 1333   ALT 11 08/31/2022 1300   ALT 26 01/20/2017 1324   ALT 10 04/05/2016 1333   BILITOT 0.4 08/31/2022 1300   BILITOT 0.30 04/05/2016 1333       Impression and Plan: Mr. Rogus is a very pleasant 66 yo caucasian gentleman with history of squamous cell carcinoma of the left tonsil.  Thankfully, the tumor is HPV positive.   He completed chemo/radiation therapy with [facility] in August 2017.  Again, I think the weight is always can be with him.  There is really not much that I will think he can do.  We will still plan to get him back to see [place] in a couple months.  Hopefully, we can do his schedule since that we can get him through the [place] season.   Josph Macho, MD 7/31/20241:37 PM

## 2022-09-01 ENCOUNTER — Telehealth: Payer: Self-pay | Admitting: Dietician

## 2022-09-01 ENCOUNTER — Encounter: Payer: Medicare PPO | Admitting: Dietician

## 2022-09-01 NOTE — Telephone Encounter (Signed)
Attempted to reach patient for a scheduled remote nutrition follow up. Provided my cell# on voice mail to return call for his follow up consult.   Gennaro Africa, RDN, LDN Registered Dietitian, [facility] Part Time Remote (Usual office hours: Tuesday-Thursday) Cell: [phone]

## 2022-09-07 ENCOUNTER — Encounter: Payer: Medicare HMO | Attending: Physical Medicine and Rehabilitation | Admitting: Registered Nurse

## 2022-09-07 ENCOUNTER — Encounter: Payer: Self-pay | Admitting: Registered Nurse

## 2022-09-07 VITALS — BP 134/83 | HR 55 | Ht 65.0 in | Wt 110.0 lb

## 2022-09-07 DIAGNOSIS — Z9889 Other specified postprocedural states: Secondary | ICD-10-CM | POA: Insufficient documentation

## 2022-09-07 DIAGNOSIS — M961 Postlaminectomy syndrome, not elsewhere classified: Secondary | ICD-10-CM

## 2022-09-07 DIAGNOSIS — Z5181 Encounter for therapeutic drug level monitoring: Secondary | ICD-10-CM

## 2022-09-07 DIAGNOSIS — R202 Paresthesia of skin: Secondary | ICD-10-CM

## 2022-09-07 DIAGNOSIS — R0902 Hypoxemia: Secondary | ICD-10-CM | POA: Insufficient documentation

## 2022-09-07 DIAGNOSIS — G894 Chronic pain syndrome: Secondary | ICD-10-CM

## 2022-09-07 DIAGNOSIS — G8929 Other chronic pain: Secondary | ICD-10-CM | POA: Insufficient documentation

## 2022-09-07 DIAGNOSIS — M545 Low back pain, unspecified: Secondary | ICD-10-CM | POA: Diagnosis present

## 2022-09-07 DIAGNOSIS — Z79891 Long term (current) use of opiate analgesic: Secondary | ICD-10-CM

## 2022-09-07 MED ORDER — OXYCODONE-ACETAMINOPHEN 7.5-325 MG PO TABS: 1.0000 | ORAL_TABLET | ORAL | 0 refills | Status: DC | PRN

## 2022-09-07 MED ORDER — XTAMPZA ER 18 MG PO C12A: 1.0000 | EXTENDED_RELEASE_CAPSULE | Freq: Two times a day (BID) | ORAL | 0 refills | Status: DC

## 2022-09-07 NOTE — Progress Notes (Signed)
Subjective:    Patient ID: Bruce Olson, male    DOB: 1955/08/24, 67 y.o.   MRN: [id number]  HPI: Bruce Olson is a 67 y.o. male who returns for follow up appointment for chronic pain and medication refill. He states his pain is located in his lower back, right lower extremity with numbness. He rates his pain 7. His current exercise regime is walking and performing stretching exercises.  Mr. Sawicki arrived to office with Oxygen desaturation, he brought his home pulse oximetry reading 92%, re-checked oxygen saturation 86%. Mr. Hermoso denies SOB, he refuses ED or Urgent Care Evaluation. He will Follow up with his PCP he states.   Mr. Stellmacher Morphine equivalent is 121.50 MME.   Last UDS was Performed on 04/14/2022, it was consistent.     Pain Inventory Average Pain 7 Pain Right Now 7 My pain is sharp, stabbing, and tingling  In the last 24 hours, has pain interfered with the following? General activity 7 Relation with others 7 Enjoyment of life 7 What TIME of day is your pain at its worst? morning , daytime, evening, and night Sleep (in general) Poor  Pain is worse with: walking, sitting, and standing Pain improves with: heat/ice, medication, and TENS Relief from Meds: 3  Family History  Problem Relation Age of Onset   Anesthesia problems Neg Hx    Hypotension Neg Hx    Malignant hyperthermia Neg Hx    Pseudochol deficiency Neg Hx    Social History   Socioeconomic History   Marital status: Single    Spouse name: Not on file   Number of children: Not on file   Years of education: Not on file   Highest education level: Not on file  Occupational History   Not on file  Tobacco Use   Smoking status: Every Day    Current packs/day: 1.00    Average packs/day: 1 pack/day for 33.0 years (33.0 ttl pk-yrs)    Types: Cigarettes   Smokeless tobacco: Never  Vaping Use   Vaping status: Never Used  Substance and Sexual Activity   Alcohol use: No   Drug  use: No   Sexual activity: Never    Comment: back problems  Other Topics Concern   Not on file  Social History Narrative   Not on file   Social Determinants of Health   Financial Resource Strain: Not on file  Food Insecurity: Not on file  Transportation Needs: Not on file  Physical Activity: Not on file  Stress: Not on file  Social Connections: Not on file   Past Surgical History:  Procedure Laterality Date   BACK SURGERY  2013   Back surgery  (fusion)09/15/2010 & 2013   BACK SURGERY  03-2013   HARDWARE REMOVAL N/A 03/14/2013   Procedure: HARDWARE REMOVAL;  Surgeon: Tia Alert, MD;  Location: [facility];  Service: Neurosurgery;  Laterality: N/A;   Past Surgical History:  Procedure Laterality Date   BACK SURGERY  2013   Back surgery  (fusion)09/15/2010 & 2013   BACK SURGERY  03-2013   HARDWARE REMOVAL N/A 03/14/2013   Procedure: HARDWARE REMOVAL;  Surgeon: Tia Alert, MD;  Location: [facility];  Service: Neurosurgery;  Laterality: N/A;   Past Medical History:  Diagnosis Date   Goals of care, counseling/discussion 04/07/2016   Iron deficiency anemia due to chronic blood loss 10/21/2016   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   No pertinent past medical history    Occupational injury 2013  resulted in back injury that led to surgery   Thyroid cancer (HCC)    BP 134/83   Pulse (!) 55   Ht 5\' 5"  (1.651 m)   Wt 110 lb (49.9 kg)   SpO2 (!) 89%   BMI 18.30 kg/m   Opioid Risk Score:   Fall Risk Score:  `1  Depression screen PHQ 2/9     08/11/2022   12:48 PM 06/15/2022    1:05 PM 04/14/2022   12:57 PM 03/28/2022    1:16 PM 10/01/2021   12:54 PM 08/31/2021   12:53 PM 08/05/2021    1:17 PM  Depression screen PHQ 2/9  Decreased Interest 0 0 0 0 0 0 0  Down, Depressed, Hopeless 0 0 0 0 0 0 0  PHQ - 2 Score 0 0 0 0 0 0 0     Review of Systems  Musculoskeletal:  Positive for back pain.       Thigh pain  All other systems reviewed and are negative.     Objective:    Physical Exam Vitals and nursing note reviewed.  Constitutional:      Appearance: Normal appearance.  Cardiovascular:     Rate and Rhythm: Normal rate and regular rhythm.     Pulses: Normal pulses.     Heart sounds: Normal heart sounds.  Pulmonary:     Effort: Pulmonary effort is normal.     Breath sounds: Normal breath sounds.  Musculoskeletal:     Cervical back: Normal range of motion and neck supple.     Comments: Normal Muscle Bulk and Muscle Testing Reveals:  Upper Extremities: Full ROM and Muscle Strength 5/5 Lumbar Hypersensitivity Lower Extremities: Full ROM and Muscle Strength 5/5 Arises  from Table Slowly using cane for support Narrow Based  Gait     Skin:    General: Skin is warm and dry.  Neurological:     Mental Status: He is alert and oriented to person, place, and time.  Psychiatric:        Mood and Affect: Mood normal.        Behavior: Behavior normal.         Assessment & Plan:   1. Lumbar postlaminectomy syndrome status post lumbar fusion x3: His most recent surgery was 03/14/2013. He had hardware removal and bone allograft waist and L2-3.  Continue to Monitor. Continue current Medication Regimen.09/07/2022 Refilled: oxyCODONE 7.5/325mg  one tablet every 4 hours as needed #100 and : Xtampza 18 mg one tablet every 12 hours #60.We will continue the opioid monitoring program, this consists of regular clinic visits, examinations, urine drug screen, pill counts as well as use of [place] Controlled Substance Reporting system. A 12 month History has been reviewed on the [place] Controlled Substance Reporting System on 09/07/2022.  2. Lumbar Radiculopathy: Continue current medication regimen with Lyrica. 09/07/2022 3. Insomnia: Continue current medication regimen with Trazodone.09/07/2022 4. Nausea: Continue Zofran as needed.Oncology Following.  09/07/2022 5. Cervical Spondylosis/ Cervicalgia/ Cervical Radiculitis:  No complaints today. Continue current  medication regimen with  Lyrica. 09/07/2022 6. Cervical Myofascial Pain Syndrome: Continue current treatment regimen. Continue to monitor. 09/07/2022. 7. Paresthesia: Continue Lyrica: Continue to Monitor.  09/07/2022.   F/U in 1 month

## 2022-09-09 ENCOUNTER — Ambulatory Visit: Payer: Medicare PPO | Admitting: Registered Nurse

## 2022-09-27 ENCOUNTER — Encounter: Payer: Self-pay | Admitting: Hematology & Oncology

## 2022-10-04 NOTE — Progress Notes (Unsigned)
Subjective:    Patient ID: Bruce Olson, male    DOB: 05-01-1955, 67 y.o.   MRN: [id number]  HPI: Bruce Olson is a 67 y.o. male who returns for follow up appointment for chronic pain and medication refill. He states his pain is located in his lower back and right lower extremity with tingling. He rates his pain 7. His current exercise regime is walking and performing stretching exercises.  [name] Morphine equivalent is 101.50 MME.   UDS ordered today.      Pain Inventory Average Pain 7 Pain Right Now 7 My pain is constant, sharp, stabbing, and tingling  In the last 24 hours, has pain interfered with the following? General activity 7 Relation with others 7 Enjoyment of life 7 What TIME of day is your pain at its worst? morning , daytime, evening, and night Sleep (in general) Poor  Pain is worse with: walking, sitting, and standing Pain improves with: heat/ice, medication, and TENS Relief from Meds: 3  Family History  Problem Relation Age of Onset  . Anesthesia problems Neg Hx   . Hypotension Neg Hx   . Malignant hyperthermia Neg Hx   . Pseudochol deficiency Neg Hx    Social History   Socioeconomic History  . Marital status: Single    Spouse name: Not on file  . Number of children: Not on file  . Years of education: Not on file  . Highest education level: Not on file  Occupational History  . Not on file  Tobacco Use  . Smoking status: Every Day    Current packs/day: 1.00    Average packs/day: 1 pack/day for 33.0 years (33.0 ttl pk-yrs)    Types: Cigarettes  . Smokeless tobacco: Never  Vaping Use  . Vaping status: Never Used  Substance and Sexual Activity  . Alcohol use: No  . Drug use: No  . Sexual activity: Never    Comment: back problems  Other Topics Concern  . Not on file  Social History Narrative  . Not on file   Social Determinants of Health   Financial Resource Strain: Not on file  Food Insecurity: Not on file   Transportation Needs: Not on file  Physical Activity: Not on file  Stress: Not on file  Social Connections: Not on file   Past Surgical History:  Procedure Laterality Date  . BACK SURGERY  2013   Back surgery  (fusion)09/15/2010 & 2013  . BACK SURGERY  03-2013  . HARDWARE REMOVAL N/A 03/14/2013   Procedure: HARDWARE REMOVAL;  Surgeon: Tia Alert, MD;  Location: [facility];  Service: Neurosurgery;  Laterality: N/A;   Past Surgical History:  Procedure Laterality Date  . BACK SURGERY  2013   Back surgery  (fusion)09/15/2010 & 2013  . BACK SURGERY  03-2013  . HARDWARE REMOVAL N/A 03/14/2013   Procedure: HARDWARE REMOVAL;  Surgeon: Tia Alert, MD;  Location: [facility];  Service: Neurosurgery;  Laterality: N/A;   Past Medical History:  Diagnosis Date  . Goals of care, counseling/discussion 04/07/2016  . Iron deficiency anemia due to chronic blood loss 10/21/2016  . 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 No pertinent past medical history   . Occupational injury 2013   resulted in back injury that led to surgery  . Thyroid cancer (HCC)    There were no vitals taken for this visit.  Opioid Risk Score:   Fall Risk Score:  `1  Depression screen [facility] 2/9  08/11/2022   12:48 PM 06/15/2022    1:05 PM 04/14/2022   12:57 PM 03/28/2022    1:16 PM 10/01/2021   12:54 PM 08/31/2021   12:53 PM 08/05/2021    1:17 PM  Depression screen PHQ 2/9  Decreased Interest 0 0 0 0 0 0 0  Down, Depressed, Hopeless 0 0 0 0 0 0 0  PHQ - 2 Score 0 0 0 0 0 0 0    Review of Systems  Musculoskeletal:  Positive for back pain.       Right upper leg pain  All other systems reviewed and are negative.      Objective:   Physical Exam Vitals and nursing note reviewed.  Constitutional:      Appearance: Normal appearance.  Cardiovascular:     Rate and Rhythm: Normal rate and regular rhythm.     Pulses: Normal pulses.     Heart sounds: Normal heart sounds.  Pulmonary:     Effort: Pulmonary effort is normal.      Breath sounds: Rhonchi present.  Musculoskeletal:     Cervical back: Normal range of motion and neck supple.     Comments: Normal Muscle Bulk and Muscle Testing Reveals:  Upper Extremities: Full ROM and Muscle Strength 5/5  Lumbar Hypersensitivity Lower Extremities: Full ROM and Muscle Strength 5/5 Arises from Table slowly using cane for support Narrow Based  Gait     Skin:    General: Skin is warm and dry.  Neurological:     Mental Status: He is alert and oriented to person, place, and time.  Psychiatric:        Mood and Affect: Mood normal.        Behavior: Behavior normal.         Assessment & Plan:  1. Lumbar postlaminectomy syndrome status post lumbar fusion x3: His most recent surgery was 03/14/2013. He had hardware removal and bone allograft waist and L2-3.  Continue to Monitor. Continue current Medication Regimen.10/06/2022 Refilled: oxyCODONE 7.5/325mg  one tablet every 4 hours as needed #100 and : Xtampza 18 mg one tablet every 12 hours #60.We will continue the opioid monitoring program, this consists of regular clinic visits, examinations, urine drug screen, pill counts as well as use of [place] Controlled Substance Reporting system. A 12 month History has been reviewed on the [place] Controlled Substance Reporting System on 10/06/2022.  2. Lumbar Radiculopathy: Continue current medication regimen with Lyrica. 10/06/2022 3. Insomnia: Continue current medication regimen with Trazodone.10/06/2022 4. Nausea: Continue Zofran as needed.Oncology Following.  10/06/2022 5. Cervical Spondylosis/ Cervicalgia/ Cervical Radiculitis:  No complaints today. Continue current medication regimen with  Lyrica. 10/06/2022 6. Cervical Myofascial Pain Syndrome: Continue current treatment regimen. Continue to monitor. 10/06/2022. 7. Paresthesia: Continue Lyrica: Continue to Monitor.  10/06/2022.   F/U in 1 month

## 2022-10-06 ENCOUNTER — Encounter: Payer: Medicare HMO | Attending: Physical Medicine and Rehabilitation | Admitting: Registered Nurse

## 2022-10-06 ENCOUNTER — Encounter: Payer: Self-pay | Admitting: Registered Nurse

## 2022-10-06 VITALS — BP 149/71 | HR 87 | Ht 65.0 in | Wt 109.0 lb

## 2022-10-06 DIAGNOSIS — Z79891 Long term (current) use of opiate analgesic: Secondary | ICD-10-CM | POA: Diagnosis not present

## 2022-10-06 DIAGNOSIS — G894 Chronic pain syndrome: Secondary | ICD-10-CM | POA: Insufficient documentation

## 2022-10-06 DIAGNOSIS — M545 Low back pain, unspecified: Secondary | ICD-10-CM | POA: Diagnosis present

## 2022-10-06 DIAGNOSIS — Z5181 Encounter for therapeutic drug level monitoring: Secondary | ICD-10-CM | POA: Insufficient documentation

## 2022-10-06 DIAGNOSIS — R202 Paresthesia of skin: Secondary | ICD-10-CM | POA: Diagnosis present

## 2022-10-06 DIAGNOSIS — G8929 Other chronic pain: Secondary | ICD-10-CM | POA: Diagnosis present

## 2022-10-06 DIAGNOSIS — Z9889 Other specified postprocedural states: Secondary | ICD-10-CM | POA: Insufficient documentation

## 2022-10-06 MED ORDER — OXYCODONE-ACETAMINOPHEN 7.5-325 MG PO TABS: 1.0000 | ORAL_TABLET | ORAL | 0 refills | Status: DC | PRN

## 2022-10-06 MED ORDER — XTAMPZA ER 18 MG PO C12A: 1.0000 | EXTENDED_RELEASE_CAPSULE | Freq: Two times a day (BID) | ORAL | 0 refills | Status: DC

## 2022-10-11 ENCOUNTER — Ambulatory Visit: Payer: Medicare PPO | Admitting: Registered Nurse

## 2022-10-13 LAB — DRUG TOX MONITOR 1 W/CONF, ORAL FLD
Amphetamines: NEGATIVE ng/mL (ref <10)
Barbiturates: NEGATIVE ng/mL (ref <10)
Benzodiazepines: NEGATIVE ng/mL (ref <0.50)
Buprenorphine: NEGATIVE ng/mL (ref <0.10)
Cocaine: NEGATIVE ng/mL (ref <5.0)
Codeine: NEGATIVE ng/mL (ref <2.5)
Cotinine: 219.5 ng/mL — ABNORMAL HIGH (ref <5.0)
Dihydrocodeine: NEGATIVE ng/mL (ref <2.5)
Fentanyl: NEGATIVE ng/mL (ref <0.10)
Heroin Metabolite: NEGATIVE ng/mL (ref <1.0)
Hydrocodone: NEGATIVE ng/mL (ref <2.5)
Hydromorphone: NEGATIVE ng/mL (ref <2.5)
MARIJUANA: NEGATIVE ng/mL (ref <2.5)
MDMA: NEGATIVE ng/mL (ref <10)
Meprobamate: NEGATIVE ng/mL (ref <2.5)
Methadone: NEGATIVE ng/mL (ref <5.0)
Morphine: NEGATIVE ng/mL (ref <2.5)
Nicotine Metabolite: POSITIVE ng/mL — AB (ref <5.0)
Norhydrocodone: NEGATIVE ng/mL (ref <2.5)
Noroxycodone: 58.8 ng/mL — ABNORMAL HIGH (ref <2.5)
Opiates: POSITIVE ng/mL — AB (ref <2.5)
Oxycodone: >250 ng/mL — ABNORMAL HIGH (ref <2.5)
Oxymorphone: NEGATIVE ng/mL (ref <2.5)
Phencyclidine: NEGATIVE ng/mL (ref <10)
Tapentadol: NEGATIVE ng/mL (ref <5.0)
Tramadol: NEGATIVE ng/mL (ref <5.0)
Zolpidem: NEGATIVE ng/mL (ref <5.0)

## 2022-10-13 LAB — DRUG TOX ALC METAB W/CON, ORAL FLD

## 2022-10-26 ENCOUNTER — Inpatient Hospital Stay (HOSPITAL_BASED_OUTPATIENT_CLINIC_OR_DEPARTMENT_OTHER): Payer: Medicare HMO | Admitting: Hematology & Oncology

## 2022-10-26 ENCOUNTER — Encounter: Payer: Self-pay | Admitting: Hematology & Oncology

## 2022-10-26 ENCOUNTER — Inpatient Hospital Stay: Payer: Medicare HMO | Attending: Hematology & Oncology

## 2022-10-26 ENCOUNTER — Other Ambulatory Visit: Payer: Self-pay | Admitting: Hematology & Oncology

## 2022-10-26 VITALS — BP 123/60 | HR 85 | Temp 98.2°F | Resp 20 | Ht 65.0 in | Wt 106.0 lb

## 2022-10-26 DIAGNOSIS — Z85818 Personal history of malignant neoplasm of other sites of lip, oral cavity, and pharynx: Secondary | ICD-10-CM | POA: Diagnosis present

## 2022-10-26 DIAGNOSIS — Z9221 Personal history of antineoplastic chemotherapy: Secondary | ICD-10-CM | POA: Diagnosis not present

## 2022-10-26 DIAGNOSIS — Z923 Personal history of irradiation: Secondary | ICD-10-CM | POA: Diagnosis not present

## 2022-10-26 DIAGNOSIS — R634 Abnormal weight loss: Secondary | ICD-10-CM | POA: Diagnosis not present

## 2022-10-26 DIAGNOSIS — C090 Malignant neoplasm of tonsillar fossa: Secondary | ICD-10-CM | POA: Diagnosis not present

## 2022-10-26 LAB — CBC WITH DIFFERENTIAL (CANCER CENTER ONLY)
Abs Immature Granulocytes: 0.02 10*3/uL (ref 0.00–0.07)
Basophils Absolute: 0 10*3/uL (ref 0.0–0.1)
Basophils Relative: 0 %
Eosinophils Absolute: 0.1 10*3/uL (ref 0.0–0.5)
Eosinophils Relative: 2 %
HCT: 46.1 % (ref 39.0–52.0)
Hemoglobin: 15.9 g/dL (ref 13.0–17.0)
Immature Granulocytes: 0 %
Lymphocytes Relative: 18 %
Lymphs Abs: 1.3 10*3/uL (ref 0.7–4.0)
MCH: 31.7 pg (ref 26.0–34.0)
MCHC: 34.5 g/dL (ref 30.0–36.0)
MCV: 91.8 fL (ref 80.0–100.0)
Monocytes Absolute: 1.1 10*3/uL — ABNORMAL HIGH (ref 0.1–1.0)
Monocytes Relative: 15 %
Neutro Abs: 4.8 10*3/uL (ref 1.7–7.7)
Neutrophils Relative %: 65 %
Platelet Count: 158 10*3/uL (ref 150–400)
RBC: 5.02 MIL/uL (ref 4.22–5.81)
RDW: 12.7 % (ref 11.5–15.5)
WBC Count: 7.3 10*3/uL (ref 4.0–10.5)
nRBC: 0 % (ref 0.0–0.2)

## 2022-10-26 LAB — CMP (CANCER CENTER ONLY)
ALT: 27 U/L (ref 0–44)
AST: 30 U/L (ref 15–41)
Albumin: 3.9 g/dL (ref 3.5–5.0)
Alkaline Phosphatase: 54 U/L (ref 38–126)
Anion gap: 9 (ref 5–15)
BUN: 33 mg/dL — ABNORMAL HIGH (ref 8–23)
CO2: 28 mmol/L (ref 22–32)
Calcium: 8.5 mg/dL — ABNORMAL LOW (ref 8.9–10.3)
Chloride: 95 mmol/L — ABNORMAL LOW (ref 98–111)
Creatinine: 1.25 mg/dL — ABNORMAL HIGH (ref 0.61–1.24)
GFR, Estimated: >60 mL/min (ref >60)
Glucose, Bld: 95 mg/dL (ref 70–99)
Potassium: 3.5 mmol/L (ref 3.5–5.1)
Sodium: 132 mmol/L — ABNORMAL LOW (ref 135–145)
Total Bilirubin: 0.7 mg/dL (ref 0.3–1.2)
Total Protein: 6.2 g/dL — ABNORMAL LOW (ref 6.5–8.1)

## 2022-10-26 LAB — PREALBUMIN: Prealbumin: 20 mg/dL (ref 18–38)

## 2022-10-26 MED ORDER — OXANDROLONE 2.5 MG PO TABS: 5.0000 mg | ORAL_TABLET | Freq: Two times a day (BID) | ORAL | 0 refills | Status: DC

## 2022-10-26 NOTE — Progress Notes (Signed)
Hematology and Oncology Follow Up Visit  Bruce Olson [id number] January 23, 1956 67 y.o. 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  We last saw him back in July.  Since then, he has been losing some more weight.  I am not sure why he is losing weight.  He says he is eating.  I am not sure how much he is able to eat.  We done a fairly extensive workup on the weight loss.  So far, we have not found a real problem with the weight loss.  He has had some episodes of nausea and vomiting.  He has had this chronically.  There is been no problems with fever.  He has had no issues with COVID.  There has been no issues with pain.  He has had no bleeding.  Overall, I would say performance status is probably ECOG 2.     Medications:  Allergies as of 10/26/2022   No Known Allergies      Medication List        Accurate as of October 26, 2022  1:46 PM. If you have any questions, ask your nurse or doctor.          STOP taking these medications    albuterol 108 (90 Base) MCG/ACT inhaler Commonly known as: VENTOLIN HFA Stopped by: Bruce Olson       TAKE these medications    ALBUTEROL IN 2 puff EVERY 6 HOURS (route: inhalation)   docusate sodium 100 MG capsule Commonly known as: COLACE Take 700 mg by mouth daily.   dronabinol 2.5 MG capsule Commonly known as: MARINOL Take 1 capsule (2.5 mg total) by mouth 2 (two) times daily before lunch and supper.   guaiFENesin 600 MG 12 hr tablet Commonly known as: MUCINEX Take by mouth daily.   levothyroxine 50 MCG tablet Commonly known as: SYNTHROID TAKE 1 TABLET BY MOUTH EVERY MORNING ON AN EMPTY STOMACH   metoCLOPramide 10 MG tablet Commonly known as: REGLAN TAKE 1 TABLET BY  MOUTH 4 TIMES DAILY   multivitamin with minerals Tabs tablet Take 1 tablet by mouth daily.   Narcan 4 MG/0.1ML Liqd nasal spray kit Generic drug: naloxone   OLANZapine 10 MG tablet Commonly known as: ZYPREXA TAKE 1 TABLET BY MOUTH EVERY NIGHT AT BEDTIME   oxyCODONE-acetaminophen 7.5-325 MG tablet Commonly known as: Percocet Take 1 tablet by mouth every 4 (four) hours as needed. May take one extra tablet when pain is sever. No more than 4 a day.   OXYGEN   pilocarpine 5 MG tablet Commonly known as: SALAGEN Take 1 tablet by mouth 2 (two) times daily.   pregabalin 100 MG capsule Commonly known as: LYRICA Take 1 capsule (100 mg total) by mouth 3 (three) times daily.   Xtampza ER 18 MG C12a Generic drug: oxyCODONE ER Take 1 capsule by mouth every 12 (twelve) hours.        Allergies: No Known Allergies  Past Medical History, Surgical history, Social history, and Family History were reviewed and updated.  Review of Systems: Review of Systems  Constitutional:  Positive for malaise/fatigue.  HENT: Negative.    Eyes: Negative.   Respiratory: Negative.    Cardiovascular: Negative.   Gastrointestinal:  Positive for nausea and vomiting.  Genitourinary: Negative.   Musculoskeletal:  Positive for back pain.  Skin: Negative.   Neurological: Negative.   Endo/Heme/Allergies: Negative.   Psychiatric/Behavioral: Negative.       Physical Exam:  height is 5\' 5"  (1.651 m) and weight is 106 lb 0.6 oz (48.1 kg). His oral temperature is 98.2 F (36.8 C). His blood pressure is 123/60 and his pulse is 85. His respiration is 20 and oxygen saturation is 90%.   Wt Readings from Last 3 Encounters:  10/26/22 106 lb 0.6 oz (48.1 kg)  10/06/22 109 lb (49.4 kg)  09/07/22 110 lb (49.9 kg)    Physical Activity: Not on file   Physical Exam Vitals reviewed.  HENT:     Head: Normocephalic and atraumatic.  Eyes:     Pupils: Pupils are equal, round, and reactive to light.   Cardiovascular:     Rate and Rhythm: Normal rate and regular rhythm.     Heart sounds: Normal heart sounds.  Pulmonary:     Effort: Pulmonary effort is normal.     Breath sounds: Normal breath sounds.  Abdominal:     General: Bowel sounds are normal.     Palpations: Abdomen is soft.  Musculoskeletal:        General: No tenderness or deformity. Normal range of motion.     Cervical back: Normal range of motion.  Lymphadenopathy:     Cervical: No cervical adenopathy.  Skin:    General: Skin is warm and dry.     Findings: No erythema or rash.  Neurological:     Mental Status: He is alert and oriented to person, place, and time.  Psychiatric:        Behavior: Behavior normal.        Thought Content: Thought content normal.        Judgment: Judgment normal.       Lab Results  Component Value Date   WBC 7.3 10/26/2022   HGB 15.9 10/26/2022   HCT 46.1 10/26/2022   MCV 91.8 10/26/2022   PLT 158 10/26/2022   Lab Results  Component Value Date   FERRITIN 482 (H) 08/31/2022   IRON 37 (L) 08/31/2022   TIBC 304 08/31/2022   UIBC 267 08/31/2022   IRONPCTSAT 12 (L) 08/31/2022   Lab Results  Component Value Date   RETICCTPCT 1.7 06/12/2020   RBC 5.02 10/26/2022   No results found for: "KPAFRELGTCHN", "LAMBDASER", "KAPLAMBRATIO" No results found for: "IGGSERUM", "IGA", "IGMSERUM" No results found for: "TOTALPROTELP", "ALBUMINELP", "A1GS", "A2GS", "BETS", "BETA2SER", "GAMS", "MSPIKE", "SPEI"   Chemistry      Component Value Date/Time   NA 133 (L) 08/31/2022 1300   NA 138 01/20/2017 1324   NA 135 (L) 04/05/2016 1333   K 4.7 08/31/2022 1300   K 4.5 01/20/2017 1324   K 4.7 04/05/2016 1333   CL 94 (L) 08/31/2022 1300   CL 97 (L) 01/20/2017 1324   CO2 35 (H) 08/31/2022 1300   CO2 30 01/20/2017 1324   CO2 29 04/05/2016 1333   BUN 14 08/31/2022 1300   BUN 15 01/20/2017 1324   BUN 23.6 04/05/2016 1333   CREATININE 0.82 08/31/2022 1300   CREATININE 1.2 01/20/2017 1324    CREATININE 1.1 04/05/2016 1333      Component Value Date/Time   CALCIUM 9.0 08/31/2022 1300   CALCIUM 9.2 01/20/2017 1324   CALCIUM 9.2 04/05/2016 1333   ALKPHOS 61 08/31/2022 1300   ALKPHOS 51 01/20/2017 1324  ALKPHOS 67 04/05/2016 1333   AST 18 08/31/2022 1300   AST 17 04/05/2016 1333   ALT 11 08/31/2022 1300   ALT 26 01/20/2017 1324   ALT 10 04/05/2016 1333   BILITOT 0.4 08/31/2022 1300   BILITOT 0.30 04/05/2016 1333       Impression and Plan: Mr. Jeanette is a very pleasant 67 yo caucasian gentleman with history of squamous cell carcinoma of the left tonsil.  Thankfully, the tumor is HPV positive.   He completed chemo/radiation therapy with [facility] in August 2017.  I will try him on some Oxandrin.  Maybe, this will help with his weight gain.  I know if he loses more weight, Tasigna put more pressure on his heart.  I just do not think he needs to have this in all honesty.  I would like to see him back before the Holiday season.  I want to make sure that we try to optimize his caloric intake so that he can enjoy the holidays.   Bruce Macho, MD 9/25/20241:46 PM

## 2022-10-27 ENCOUNTER — Other Ambulatory Visit: Payer: Self-pay | Admitting: *Deleted

## 2022-10-27 ENCOUNTER — Encounter: Payer: Self-pay | Admitting: Hematology & Oncology

## 2022-10-27 ENCOUNTER — Telehealth: Payer: Self-pay

## 2022-10-27 ENCOUNTER — Other Ambulatory Visit (HOSPITAL_BASED_OUTPATIENT_CLINIC_OR_DEPARTMENT_OTHER): Payer: Self-pay

## 2022-10-27 DIAGNOSIS — K909 Intestinal malabsorption, unspecified: Secondary | ICD-10-CM

## 2022-10-27 DIAGNOSIS — R911 Solitary pulmonary nodule: Secondary | ICD-10-CM

## 2022-10-27 DIAGNOSIS — C090 Malignant neoplasm of tonsillar fossa: Secondary | ICD-10-CM

## 2022-10-27 MED ORDER — OXANDROLONE 10 MG PO TABS
10.0000 mg | ORAL_TABLET | Freq: Every day | ORAL | 1 refills | Status: DC
  Filled 2022-10-27 (×2): qty 30, 30d supply, fill #0

## 2022-10-27 NOTE — Telephone Encounter (Signed)
Notified Patient of prior authorization approval for Oxandrolone 10mg  Tablets. Medication is approved through 10/27/2023. No other needs or concerns voiced at this time.

## 2022-11-02 NOTE — Progress Notes (Unsigned)
Subjective:    Patient ID: Bruce Olson, male    DOB: November 21, 1955, 67 y.o.   MRN: [id number]  HPI: Bruce Olson is a 67 y.o. male who returns for follow up appointment for chronic pain and medication refill. He states his pain is located in his mid- lower back. He rates his pain 7. His current exercise regime is walking and performing stretching exercises.  Bruce Olson Morphine equivalent is 97.50 MME.   Last Oral Swab was Performed on 10/06/2022, it was consistent.      Pain Inventory Average Pain 7 Pain Right Now 7 My pain is sharp, stabbing, and tingling  In the last 24 hours, has pain interfered with the following? General activity 7 Relation with others 7 Enjoyment of life 7 What TIME of day is your pain at its worst? morning , daytime, evening, and night Sleep (in general) Poor  Pain is worse with: walking, sitting, and standing Pain improves with: heat/ice, medication, and TENS Relief from Meds: 3  Family History  Problem Relation Age of Onset   Anesthesia problems Neg Hx    Hypotension Neg Hx    Malignant hyperthermia Neg Hx    Pseudochol deficiency Neg Hx    Social History   Socioeconomic History   Marital status: Single    Spouse name: Not on file   Number of children: Not on file   Years of education: Not on file   Highest education level: Not on file  Occupational History   Not on file  Tobacco Use   Smoking status: Every Day    Current packs/day: 1.00    Average packs/day: 1 pack/day for 33.0 years (33.0 ttl pk-yrs)    Types: Cigarettes   Smokeless tobacco: Never  Vaping Use   Vaping status: Never Used  Substance and Sexual Activity   Alcohol use: No   Drug use: No   Sexual activity: Not Currently    Comment: back problems  Other Topics Concern   Not on file  Social History Narrative   Not on file   Social Determinants of Health   Financial Resource Strain: Not on file  Food Insecurity: Not on file  Transportation Needs:  Not on file  Physical Activity: Not on file  Stress: Not on file  Social Connections: Not on file   Past Surgical History:  Procedure Laterality Date   BACK SURGERY  2013   Back surgery  (fusion)09/15/2010 & 2013   BACK SURGERY  03-2013   HARDWARE REMOVAL N/A 03/14/2013   Procedure: HARDWARE REMOVAL;  Surgeon: Tia Alert, MD;  Location: [facility];  Service: Neurosurgery;  Laterality: N/A;   Past Surgical History:  Procedure Laterality Date   BACK SURGERY  2013   Back surgery  (fusion)09/15/2010 & 2013   BACK SURGERY  03-2013   HARDWARE REMOVAL N/A 03/14/2013   Procedure: HARDWARE REMOVAL;  Surgeon: Tia Alert, MD;  Location: [facility];  Service: Neurosurgery;  Laterality: N/A;   Past Medical History:  Diagnosis Date   Goals of care, counseling/discussion 04/07/2016   Iron deficiency anemia due to chronic blood loss 10/21/2016   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  Opioid Risk Score:   Fall Risk Score:  `1  Depression screen [facility] 2/9  10/06/2022   12:53 PM 08/11/2022   12:48 PM 06/15/2022    1:05 PM 04/14/2022   12:57 PM 03/28/2022    1:16 PM 10/01/2021   12:54 PM 08/31/2021   12:53 PM  Depression screen PHQ 2/9  Decreased Interest 0 0 0 0 0 0 0  Down, Depressed, Hopeless 0 0 0 0 0 0 0  PHQ - 2 Score 0 0 0 0 0 0 0    Review of Systems  Musculoskeletal:  Positive for back pain and gait problem.  All other systems reviewed and are negative.     Objective:   Physical Exam Vitals and nursing note reviewed.  Constitutional:      Appearance: Normal appearance.  Cardiovascular:     Rate and Rhythm: Normal rate and regular rhythm.     Pulses: Normal pulses.     Heart sounds: Normal heart sounds.  Pulmonary:     Effort: Pulmonary effort is normal.     Breath sounds: Normal breath sounds.   Musculoskeletal:     Cervical back: Normal range of motion and neck supple.     Comments: Normal Muscle Bulk and Muscle Testing Reveals:  Upper Extremities: Full ROM and Muscle Strength 5/5  Thoracic and Lumbar Hypersensitivity Lower Extremities: Full ROM and Muscle Strength 5/5 Arises from Table slowly Narrow Based  Gait     Skin:    General: Skin is warm and dry.  Neurological:     Mental Status: He is alert and oriented to person, place, and time.  Psychiatric:        Mood and Affect: Mood normal.        Behavior: Behavior normal.         Assessment & Plan:  1. Lumbar postlaminectomy syndrome status post lumbar fusion x3: His most recent surgery was 03/14/2013. He had hardware removal and bone allograft waist and L2-3.  Continue to Monitor. Continue current Medication Regimen.11/03/2022 Refilled: oxyCODONE 7.5/325mg  one tablet every 4 hours as needed #100 and : Xtampza 18 mg one tablet every 12 hours #60.We will continue the opioid monitoring program, this consists of regular clinic visits, examinations, urine drug screen, pill counts as well as use of [place] Controlled Substance Reporting system. A 12 month History has been reviewed on the [place] Controlled Substance Reporting System on 11/03/2022.  2. Lumbar Radiculopathy: Continue current medication regimen with Lyrica. 11/03/2022 3. Insomnia: Continue current medication regimen with Trazodone.11/03/2022 4. Nausea: Continue Zofran as needed.Oncology Following.  11/03/2022 5. Cervical Spondylosis/ Cervicalgia/ Cervical Radiculitis:  No complaints today. Continue current medication regimen with  Lyrica. 11/03/2022 6. Cervical Myofascial Pain Syndrome: Continue current treatment regimen. Continue to monitor. 11/03/2022. 7. Paresthesia: Continue Lyrica: Continue to Monitor.  11/03/2022.

## 2022-11-03 ENCOUNTER — Encounter: Payer: Medicare HMO | Attending: Physical Medicine and Rehabilitation | Admitting: Registered Nurse

## 2022-11-03 ENCOUNTER — Encounter: Payer: Self-pay | Admitting: Registered Nurse

## 2022-11-03 VITALS — BP 133/64 | HR 98 | Ht 65.0 in | Wt 105.0 lb

## 2022-11-03 DIAGNOSIS — Z5181 Encounter for therapeutic drug level monitoring: Secondary | ICD-10-CM | POA: Insufficient documentation

## 2022-11-03 DIAGNOSIS — Z9889 Other specified postprocedural states: Secondary | ICD-10-CM | POA: Diagnosis present

## 2022-11-03 DIAGNOSIS — M545 Low back pain, unspecified: Secondary | ICD-10-CM | POA: Insufficient documentation

## 2022-11-03 DIAGNOSIS — R202 Paresthesia of skin: Secondary | ICD-10-CM | POA: Insufficient documentation

## 2022-11-03 DIAGNOSIS — G8929 Other chronic pain: Secondary | ICD-10-CM | POA: Diagnosis present

## 2022-11-03 DIAGNOSIS — G894 Chronic pain syndrome: Secondary | ICD-10-CM | POA: Insufficient documentation

## 2022-11-03 DIAGNOSIS — Z79891 Long term (current) use of opiate analgesic: Secondary | ICD-10-CM | POA: Insufficient documentation

## 2022-11-03 MED ORDER — OXYCODONE-ACETAMINOPHEN 7.5-325 MG PO TABS: 1.0000 | ORAL_TABLET | ORAL | 0 refills | Status: DC | PRN

## 2022-11-03 MED ORDER — XTAMPZA ER 18 MG PO C12A: 1.0000 | EXTENDED_RELEASE_CAPSULE | Freq: Two times a day (BID) | ORAL | 0 refills | Status: DC

## 2022-11-07 ENCOUNTER — Encounter: Payer: Medicare HMO | Admitting: Registered Nurse

## 2022-11-21 ENCOUNTER — Other Ambulatory Visit: Payer: Self-pay | Admitting: Hematology & Oncology

## 2022-11-21 DIAGNOSIS — C090 Malignant neoplasm of tonsillar fossa: Secondary | ICD-10-CM

## 2022-12-05 ENCOUNTER — Encounter: Payer: Self-pay | Admitting: *Deleted

## 2022-12-05 NOTE — Progress Notes (Unsigned)
Fax received from on call weekend service stating that a phone call was received on 12/30/2022 at 3:56PM from the [facility] Office to report pt.'s death.  Dr. Myna Hidalgo notified.

## 2022-12-06 ENCOUNTER — Encounter: Payer: Medicare HMO | Admitting: Registered Nurse

## 2022-12-07 ENCOUNTER — Encounter: Payer: Medicare HMO | Admitting: Registered Nurse

## 2022-12-28 ENCOUNTER — Inpatient Hospital Stay: Payer: Medicare HMO

## 2022-12-28 ENCOUNTER — Ambulatory Visit: Payer: Medicare HMO | Admitting: Hematology & Oncology

## 2023-01-01 DEATH — deceased

## 2023-01-05 ENCOUNTER — Encounter: Payer: Medicare HMO | Admitting: Registered Nurse

## 2023-01-06 ENCOUNTER — Encounter: Payer: Medicare HMO | Admitting: Registered Nurse

## 2024-02-07 ENCOUNTER — Other Ambulatory Visit (HOSPITAL_COMMUNITY): Payer: Self-pay
